# Patient Record
Sex: Male | Born: 1985 | Race: Black or African American | Hispanic: No | Marital: Single | Smoking: Former smoker
Health system: Southern US, Community
[De-identification: ages and names within clinical notes are randomized; demographics above are authoritative.]

## PROBLEM LIST (undated history)

## (undated) DIAGNOSIS — F1990 Other psychoactive substance use, unspecified, uncomplicated: Secondary | ICD-10-CM

## (undated) DIAGNOSIS — F1110 Opioid abuse, uncomplicated: Secondary | ICD-10-CM

## (undated) DIAGNOSIS — T50901A Poisoning by unspecified drugs, medicaments and biological substances, accidental (unintentional), initial encounter: Secondary | ICD-10-CM

## (undated) DIAGNOSIS — I10 Essential (primary) hypertension: Secondary | ICD-10-CM

## (undated) DIAGNOSIS — F1910 Other psychoactive substance abuse, uncomplicated: Secondary | ICD-10-CM

---

## 2014-03-26 ENCOUNTER — Emergency Department (HOSPITAL_BASED_OUTPATIENT_CLINIC_OR_DEPARTMENT_OTHER): Payer: Self-pay

## 2014-03-26 ENCOUNTER — Encounter (HOSPITAL_BASED_OUTPATIENT_CLINIC_OR_DEPARTMENT_OTHER): Payer: Self-pay | Admitting: *Deleted

## 2014-03-26 ENCOUNTER — Inpatient Hospital Stay (HOSPITAL_BASED_OUTPATIENT_CLINIC_OR_DEPARTMENT_OTHER)
Admission: EM | Admit: 2014-03-26 | Discharge: 2014-03-30 | DRG: 872 | Disposition: A | Discharge status: Home / Self Care | Payer: Self-pay | Attending: Internal Medicine | Admitting: Internal Medicine

## 2014-03-26 DIAGNOSIS — R6520 Severe sepsis without septic shock: Secondary | ICD-10-CM | POA: Diagnosis present

## 2014-03-26 DIAGNOSIS — A419 Sepsis, unspecified organism: Principal | ICD-10-CM | POA: Diagnosis present

## 2014-03-26 DIAGNOSIS — E872 Acidosis, unspecified: Secondary | ICD-10-CM | POA: Diagnosis present

## 2014-03-26 DIAGNOSIS — E876 Hypokalemia: Secondary | ICD-10-CM | POA: Diagnosis present

## 2014-03-26 DIAGNOSIS — F1010 Alcohol abuse, uncomplicated: Secondary | ICD-10-CM | POA: Diagnosis present

## 2014-03-26 DIAGNOSIS — R079 Chest pain, unspecified: Secondary | ICD-10-CM | POA: Diagnosis present

## 2014-03-26 DIAGNOSIS — R519 Headache, unspecified: Secondary | ICD-10-CM | POA: Diagnosis present

## 2014-03-26 DIAGNOSIS — R51 Headache: Secondary | ICD-10-CM

## 2014-03-26 DIAGNOSIS — E860 Dehydration: Secondary | ICD-10-CM | POA: Diagnosis present

## 2014-03-26 DIAGNOSIS — I10 Essential (primary) hypertension: Secondary | ICD-10-CM | POA: Diagnosis present

## 2014-03-26 DIAGNOSIS — M542 Cervicalgia: Secondary | ICD-10-CM | POA: Diagnosis present

## 2014-03-26 DIAGNOSIS — J019 Acute sinusitis, unspecified: Secondary | ICD-10-CM | POA: Diagnosis present

## 2014-03-26 DIAGNOSIS — J329 Chronic sinusitis, unspecified: Secondary | ICD-10-CM | POA: Diagnosis present

## 2014-03-26 DIAGNOSIS — G43909 Migraine, unspecified, not intractable, without status migrainosus: Secondary | ICD-10-CM | POA: Diagnosis present

## 2014-03-26 HISTORY — DX: Essential (primary) hypertension: I10

## 2014-03-26 LAB — BASIC METABOLIC PANEL
Anion gap: 29 — ABNORMAL HIGH (ref 5–15)
BUN: 5 mg/dL — ABNORMAL LOW (ref 6–23)
CO2: 16 mEq/L — ABNORMAL LOW (ref 19–32)
Calcium: 9 mg/dL (ref 8.4–10.5)
Chloride: 100 mEq/L (ref 96–112)
Creatinine, Ser: 1.1 mg/dL (ref 0.50–1.35)
GFR calc Af Amer: >90 mL/min (ref >90)
Glucose, Bld: 149 mg/dL — ABNORMAL HIGH (ref 70–99)
Potassium: 2.8 mEq/L — CL (ref 3.7–5.3)
SODIUM: 145 meq/L (ref 137–147)

## 2014-03-26 LAB — CBC WITH DIFFERENTIAL/PLATELET
BASOS ABS: 0 10*3/uL (ref 0.0–0.1)
BASOS PCT: 0 % (ref 0–1)
EOS PCT: 1 % (ref 0–5)
Eosinophils Absolute: 0.1 10*3/uL (ref 0.0–0.7)
HCT: 45.8 % (ref 39.0–52.0)
Hemoglobin: 15.5 g/dL (ref 13.0–17.0)
LYMPHS PCT: 14 % (ref 12–46)
Lymphs Abs: 1.7 10*3/uL (ref 0.7–4.0)
MCH: 31.7 pg (ref 26.0–34.0)
MCHC: 33.8 g/dL (ref 30.0–36.0)
MCV: 93.7 fL (ref 78.0–100.0)
Monocytes Absolute: 1.2 10*3/uL — ABNORMAL HIGH (ref 0.1–1.0)
Monocytes Relative: 10 % (ref 3–12)
Neutro Abs: 9.1 10*3/uL — ABNORMAL HIGH (ref 1.7–7.7)
Neutrophils Relative %: 75 % (ref 43–77)
PLATELETS: 321 10*3/uL (ref 150–400)
RBC: 4.89 MIL/uL (ref 4.22–5.81)
RDW: 13 % (ref 11.5–15.5)
WBC: 12.1 10*3/uL — AB (ref 4.0–10.5)

## 2014-03-26 LAB — I-STAT VENOUS BLOOD GAS, ED
Acid-base deficit: 8 mmol/L — ABNORMAL HIGH (ref 0.0–2.0)
BICARBONATE: 18.7 meq/L — AB (ref 20.0–24.0)
O2 Saturation: 70 %
PCO2 VEN: 40.2 mmHg — AB (ref 45.0–50.0)
Patient temperature: 98.6
TCO2: 20 mmol/L (ref 0–100)
pH, Ven: 7.275 (ref 7.250–7.300)
pO2, Ven: 41 mmHg (ref 30.0–45.0)

## 2014-03-26 LAB — URINALYSIS, ROUTINE W REFLEX MICROSCOPIC
Bilirubin Urine: NEGATIVE
Glucose, UA: NEGATIVE mg/dL
Hgb urine dipstick: NEGATIVE
KETONES UR: NEGATIVE mg/dL
LEUKOCYTES UA: NEGATIVE
NITRITE: NEGATIVE
PROTEIN: NEGATIVE mg/dL
Specific Gravity, Urine: 1.025 (ref 1.005–1.030)
UROBILINOGEN UA: 0.2 mg/dL (ref 0.0–1.0)
pH: 5 (ref 5.0–8.0)

## 2014-03-26 LAB — RAPID URINE DRUG SCREEN, HOSP PERFORMED
Amphetamines: NOT DETECTED
BENZODIAZEPINES: NOT DETECTED
Barbiturates: NOT DETECTED
COCAINE: NOT DETECTED
OPIATES: POSITIVE — AB
Tetrahydrocannabinol: NOT DETECTED

## 2014-03-26 LAB — I-STAT CG4 LACTIC ACID, ED
Lactic Acid, Venous: 8.48 mmol/L — ABNORMAL HIGH (ref 0.5–2.2)
Lactic Acid, Venous: 9.17 mmol/L — ABNORMAL HIGH (ref 0.5–2.2)

## 2014-03-26 LAB — TROPONIN I: Troponin I: <0.3 ng/mL (ref <0.30)

## 2014-03-26 MED ORDER — PIPERACILLIN-TAZOBACTAM 3.375 G IVPB 30 MIN
3.3750 g | Freq: Once | INTRAVENOUS | Status: AC
  Administered 2014-03-27: 3.375 g via INTRAVENOUS
  Filled 2014-03-26 (×2): qty 50

## 2014-03-26 MED ORDER — POTASSIUM CHLORIDE CRYS ER 20 MEQ PO TBCR
EXTENDED_RELEASE_TABLET | ORAL | Status: AC
  Administered 2014-03-26: 40 meq
  Filled 2014-03-26: qty 2

## 2014-03-26 MED ORDER — SODIUM CHLORIDE 0.9 % IV BOLUS (SEPSIS)
1000.0000 mL | Freq: Once | INTRAVENOUS | Status: AC
  Administered 2014-03-26: 1000 mL via INTRAVENOUS

## 2014-03-26 MED ORDER — ONDANSETRON HCL 4 MG/2ML IJ SOLN
4.0000 mg | Freq: Once | INTRAMUSCULAR | Status: AC
  Administered 2014-03-26: 4 mg via INTRAVENOUS
  Filled 2014-03-26: qty 2

## 2014-03-26 MED ORDER — POTASSIUM CHLORIDE 20 MEQ PO PACK
40.0000 meq | PACK | Freq: Once | ORAL | Status: AC
  Filled 2014-03-26: qty 2

## 2014-03-26 MED ORDER — VANCOMYCIN HCL IN DEXTROSE 1-5 GM/200ML-% IV SOLN
1000.0000 mg | Freq: Once | INTRAVENOUS | Status: AC
  Administered 2014-03-27: 1000 mg via INTRAVENOUS
  Filled 2014-03-26: qty 200

## 2014-03-26 MED ORDER — FENTANYL CITRATE 0.05 MG/ML IJ SOLN
100.0000 ug | Freq: Once | INTRAMUSCULAR | Status: AC
  Administered 2014-03-26: 100 ug via INTRAVENOUS
  Filled 2014-03-26: qty 2

## 2014-03-26 MED ORDER — POTASSIUM CHLORIDE 10 MEQ/100ML IV SOLN
10.0000 meq | Freq: Once | INTRAVENOUS | Status: AC
  Administered 2014-03-26: 10 meq via INTRAVENOUS
  Filled 2014-03-26: qty 100

## 2014-03-26 NOTE — ED Notes (Signed)
Vagal/ near syncope with IV start, placed flat supine.

## 2014-03-26 NOTE — ED Provider Notes (Addendum)
CSN: [id number]     Arrival date & time 03/26/14  2129 History   First MD Initiated Contact with Patient 03/26/14 2256     Chief Complaint  Patient presents with  . Chest Pain     (Consider location/radiation/quality/duration/timing/severity/associated sxs/prior Treatment) HPI This is a 28 year old male who has been told in the past that he has hypertension though he has not been on treatment for it. He is here with a 4 hour history of chest pain. The pain is located in his left chest and radiates to his head. He describes the pain as sharp, pressure-like and throbbing. He states he feels the pain when his heartbeats. He is having palpitations and it is noted that he has been tachycardic into the 130s. He states he is short of breath and deep breathing increases his pain. He is also having a headache, dizziness, weakness and has vomited one time. He denies diarrhea, cough or cold symptoms or fever. He states he took a [place] powder without relief. He had a near syncopal episode earlier. He has received nearly a liter of normal saline IV and continues to be tachycardic. He denies illicit drug use. He denies leg pain or swelling. He admits to a remote history or remote IV drug use but denies recent use. He states he took a Vicodin two days ago.  Past Medical History  Diagnosis Date  . Hypertension    History reviewed. No pertinent past surgical history. No family history on file. History  Substance Use Topics  . Smoking status: Never Smoker   . Smokeless tobacco: Not on file  . Alcohol Use: Yes    Review of Systems  All other systems reviewed and are negative.   Allergies  Review of patient's allergies indicates no known allergies.  Home Medications   Prior to Admission medications   Not on File   BP 112/55 mmHg  Pulse 130  Temp(Src) 98.5 F (36.9 C) (Oral)  Resp 14  Ht 5\' 11"  (1.803 m)  Wt 205 lb (92.987 kg)  BMI 28.60 kg/m2  SpO2 99%   Physical Exam  General:  Well-developed, well-nourished male in no acute distress; appearance consistent with age of record HENT: normocephalic; atraumatic; no thyromegaly Eyes: pupils equal, round and reactive to light; extraocular muscles intact Neck: supple; no meningeal signs Heart: regular rate and rhythm; tachycardic Lungs: clear to auscultation bilaterally Abdomen: soft; nondistended; nontender; no masses or hepatosplenomegaly; bowel sounds present Extremities: No deformity; full range of motion; pulses normal Neurologic: Awake, alert and oriented; motor function intact in all extremities and symmetric; no facial droop Skin: Warm and dry; multiple keloids of forearms Psychiatric: Flat affect   ED Course  Procedures (including critical care time)   EKG Interpretation   Date/Time:  Monday March 26 2014 21:37:16 EST Ventricular Rate:  132 PR Interval:  126 QRS Duration: 106 QT Interval:  340 QTC Calculation: 503 R Axis:   47 Text Interpretation:  Sinus tachycardia Possible Inferior infarct , age  undetermined ST \\T \ T wave abnormality, consider lateral ischemia Abnormal  ECG No old tracing to compare Confirmed by JACUBOWITZ  MD, SAM [phone]) on  03/26/2014 9:48:45 PM      CRITICAL CARE Performed by: Paula Libra L Total critical care time: 45 minutes Critical care time was exclusive of separately billable procedures and treating other patients. Critical care was necessary to treat or prevent imminent or life-threatening deterioration. Critical care was time spent personally by me on the following activities: development of treatment  plan with patient and/or surrogate as well as nursing, discussions with consultants, evaluation of patient's response to treatment, examination of patient, obtaining history from patient or surrogate, ordering and performing treatments and interventions, ordering and review of laboratory studies, ordering and review of radiographic studies, pulse oximetry and  re-evaluation of patient's condition.   MDM   Nursing notes and vitals signs, including pulse oximetry, reviewed.  Summary of this visit's results, reviewed by myself:  Labs:  Results for orders placed or performed during the hospital encounter of 03/26/14 (from the past 24 hour(s))  Hepatic function panel     Status: Abnormal   Collection Time: 03/26/14  9:41 PM  Result Value Ref Range   Total Protein 8.4 (H) 6.0 - 8.3 g/dL   Albumin 4.2 3.5 - 5.2 g/dL   AST 31 0 - 37 U/L   ALT 18 0 - 53 U/L   Alkaline Phosphatase 68 39 - 117 U/L   Total Bilirubin <0.2 (L) 0.3 - 1.2 mg/dL   Bilirubin, Direct <7.8<0.2 0.0 - 0.3 mg/dL   Indirect Bilirubin NOT CALCULATED 0.3 - 0.9 mg/dL  Troponin I ([facility])     Status: None   Collection Time: 03/26/14  9:46 PM  Result Value Ref Range   Troponin I <0.30 <0.30 ng/mL  Basic metabolic panel     Status: Abnormal   Collection Time: 03/26/14  9:46 PM  Result Value Ref Range   Sodium 145 137 - 147 mEq/L   Potassium 2.8 (LL) 3.7 - 5.3 mEq/L   Chloride 100 96 - 112 mEq/L   CO2 16 (L) 19 - 32 mEq/L   Glucose, Bld 149 (H) 70 - 99 mg/dL   BUN 5 (L) 6 - 23 mg/dL   Creatinine, Ser 2.951.10 0.50 - 1.35 mg/dL   Calcium 9.0 8.4 - 62.110.5 mg/dL   GFR calc non Af Amer >90 >90 mL/min   GFR calc Af Amer >90 >90 mL/min   Anion gap 29 (H) 5 - 15  CBC with Differential     Status: Abnormal   Collection Time: 03/26/14  9:46 PM  Result Value Ref Range   WBC 12.1 (H) 4.0 - 10.5 K/uL   RBC 4.89 4.22 - 5.81 MIL/uL   Hemoglobin 15.5 13.0 - 17.0 g/dL   HCT 30.845.8 65.739.0 - 84.652.0 %   MCV 93.7 78.0 - 100.0 fL   MCH 31.7 26.0 - 34.0 pg   MCHC 33.8 30.0 - 36.0 g/dL   RDW 96.213.0 95.211.5 - 84.115.5 %   Platelets 321 150 - 400 K/uL   Neutrophils Relative % 75 43 - 77 %   Neutro Abs 9.1 (H) 1.7 - 7.7 K/uL   Lymphocytes Relative 14 12 - 46 %   Lymphs Abs 1.7 0.7 - 4.0 K/uL   Monocytes Relative 10 3 - 12 %   Monocytes Absolute 1.2 (H) 0.1 - 1.0 K/uL   Eosinophils Relative 1 0 - 5 %   Eosinophils  Absolute 0.1 0.0 - 0.7 K/uL   Basophils Relative 0 0 - 1 %   Basophils Absolute 0.0 0.0 - 0.1 K/uL  Urinalysis, Routine w reflex microscopic     Status: None   Collection Time: 03/26/14 10:55 PM  Result Value Ref Range   Color, Urine YELLOW YELLOW   APPearance CLEAR CLEAR   Specific Gravity, Urine 1.025 1.005 - 1.030   pH 5.0 5.0 - 8.0   Glucose, UA NEGATIVE NEGATIVE mg/dL   Hgb urine dipstick NEGATIVE NEGATIVE   Bilirubin  Urine NEGATIVE NEGATIVE   Ketones, ur NEGATIVE NEGATIVE mg/dL   Protein, ur NEGATIVE NEGATIVE mg/dL   Urobilinogen, UA 0.2 0.0 - 1.0 mg/dL   Nitrite NEGATIVE NEGATIVE   Leukocytes, UA NEGATIVE NEGATIVE  Drug screen panel, emergency     Status: Abnormal   Collection Time: 03/26/14 11:02 PM  Result Value Ref Range   Opiates POSITIVE (A) NONE DETECTED   Cocaine NONE DETECTED NONE DETECTED   Benzodiazepines NONE DETECTED NONE DETECTED   Amphetamines NONE DETECTED NONE DETECTED   Tetrahydrocannabinol NONE DETECTED NONE DETECTED   Barbiturates NONE DETECTED NONE DETECTED  D-dimer, quantitative     Status: None   Collection Time: 03/26/14 11:05 PM  Result Value Ref Range   D-Dimer, Quant <0.27 0.00 - 0.48 ug/mL-FEU  I-Stat CG4 Lactic Acid, ED     Status: Abnormal   Collection Time: 03/26/14 11:13 PM  Result Value Ref Range   Lactic Acid, Venous 9.17 (H) 0.5 - 2.2 mmol/L  I-Stat Venous Blood Gas, ED ([facility] and [facility] only)     Status: Abnormal   Collection Time: 03/26/14 11:14 PM  Result Value Ref Range   pH, Ven 7.275 7.250 - 7.300   pCO2, Ven 40.2 (L) 45.0 - 50.0 mmHg   pO2, Ven 41.0 30.0 - 45.0 mmHg   Bicarbonate 18.7 (L) 20.0 - 24.0 mEq/L   TCO2 20 0 - 100 mmol/L   O2 Saturation 70.0 %   Acid-base deficit 8.0 (H) 0.0 - 2.0 mmol/L   Patient temperature 98.6 F    Collection site IV START    Drawn by Nurse    Sample type VENOUS   I-Stat CG4 Lactic Acid, ED     Status: Abnormal   Collection Time: 03/26/14 11:27 PM  Result Value Ref Range   Lactic  Acid, Venous 8.48 (H) 0.5 - 2.2 mmol/L  I-Stat CG4 Lactic Acid, ED     Status: Abnormal   Collection Time: 03/27/14 12:15 AM  Result Value Ref Range   Lactic Acid, Venous 6.68 (H) 0.5 - 2.2 mmol/L    Imaging Studies: Dg Chest 2 View  03/27/2014   CLINICAL DATA:  Chest pain  EXAM: CHEST  2 VIEW  COMPARISON:  None.  FINDINGS: Normal heart size and mediastinal contours. No acute infiltrate or edema. No effusion or pneumothorax. No acute osseous findings.  IMPRESSION: No active cardiopulmonary disease.   Electronically Signed   By: Tiburcio PeaJonathan  Watts M.D.   On: 03/27/2014 01:35    11:45 PM Second liter of normal saline infusing. Blood cultures obtained. Zosyn IV ordered for possible sepsis.   12:09 AM Level 2 Code Sepsis called. 2L NS have been given. Repeat lactic acid pending.  12:23 AM Level 1 Code Sepsis for continued lactic acid > 4 after IVF bolus.  12:30 AM Discussed with Leitha BleakLiz Deterding, MD of PCCM. She accepts for transfer to ICU.         Hanley SeamenJohn L Deeanna Beightol, MD 03/27/14 0150  Hanley SeamenJohn L Lizania Bouchard, MD 03/27/14 [phone]

## 2014-03-26 NOTE — ED Notes (Signed)
EDP notified of critical K

## 2014-03-26 NOTE — ED Notes (Addendum)
H/o htn. Not prescribed medicine. C/o HA, dizziness, CP, sob, weakness, nv, pre-syncopal. Denies: LOC, cough congestion cold sx, diarrhea or fever). Onset 3 hrs ago. Relates sx to BP. Took goodies powder 3 hrs ago w/o relief. HR tachy, "feel dehydrated".

## 2014-03-27 ENCOUNTER — Inpatient Hospital Stay (HOSPITAL_COMMUNITY): Payer: Self-pay

## 2014-03-27 DIAGNOSIS — M542 Cervicalgia: Secondary | ICD-10-CM | POA: Diagnosis present

## 2014-03-27 DIAGNOSIS — A419 Sepsis, unspecified organism: Principal | ICD-10-CM

## 2014-03-27 DIAGNOSIS — R51 Headache: Secondary | ICD-10-CM

## 2014-03-27 DIAGNOSIS — G441 Vascular headache, not elsewhere classified: Secondary | ICD-10-CM

## 2014-03-27 DIAGNOSIS — R079 Chest pain, unspecified: Secondary | ICD-10-CM | POA: Diagnosis present

## 2014-03-27 DIAGNOSIS — R519 Headache, unspecified: Secondary | ICD-10-CM | POA: Diagnosis present

## 2014-03-27 DIAGNOSIS — R0789 Other chest pain: Secondary | ICD-10-CM

## 2014-03-27 LAB — BASIC METABOLIC PANEL
ANION GAP: 16 — AB (ref 5–15)
ANION GAP: 10 (ref 5–15)
Anion gap: 13 (ref 5–15)
BUN: 4 mg/dL — ABNORMAL LOW (ref 6–23)
BUN: 4 mg/dL — ABNORMAL LOW (ref 6–23)
BUN: 3 mg/dL — ABNORMAL LOW (ref 6–23)
CALCIUM: 7.3 mg/dL — AB (ref 8.4–10.5)
CHLORIDE: 111 meq/L (ref 96–112)
CHLORIDE: 113 meq/L — AB (ref 96–112)
CHLORIDE: 107 meq/L (ref 96–112)
CO2: 18 mEq/L — ABNORMAL LOW (ref 19–32)
CO2: 17 meq/L — AB (ref 19–32)
CO2: 21 mEq/L (ref 19–32)
CREATININE: 0.92 mg/dL (ref 0.50–1.35)
Calcium: 7.2 mg/dL — ABNORMAL LOW (ref 8.4–10.5)
Calcium: 8.1 mg/dL — ABNORMAL LOW (ref 8.4–10.5)
Creatinine, Ser: 0.86 mg/dL (ref 0.50–1.35)
Creatinine, Ser: 0.84 mg/dL (ref 0.50–1.35)
GFR calc Af Amer: >90 mL/min (ref >90)
GFR calc Af Amer: >90 mL/min (ref >90)
GFR calc Af Amer: >90 mL/min (ref >90)
GFR calc non Af Amer: >90 mL/min (ref >90)
GFR calc non Af Amer: >90 mL/min (ref >90)
GFR calc non Af Amer: >90 mL/min (ref >90)
Glucose, Bld: 126 mg/dL — ABNORMAL HIGH (ref 70–99)
Glucose, Bld: 114 mg/dL — ABNORMAL HIGH (ref 70–99)
Glucose, Bld: 132 mg/dL — ABNORMAL HIGH (ref 70–99)
POTASSIUM: 4 meq/L (ref 3.7–5.3)
Potassium: 3.7 mEq/L (ref 3.7–5.3)
Potassium: 3.5 mEq/L — ABNORMAL LOW (ref 3.7–5.3)
SODIUM: 143 meq/L (ref 137–147)
SODIUM: 138 meq/L (ref 137–147)
Sodium: 145 mEq/L (ref 137–147)

## 2014-03-27 LAB — I-STAT CG4 LACTIC ACID, ED: LACTIC ACID, VENOUS: 6.68 mmol/L — AB (ref 0.5–2.2)

## 2014-03-27 LAB — HEPATIC FUNCTION PANEL
ALT: 18 U/L (ref 0–53)
AST: 31 U/L (ref 0–37)
Albumin: 4.2 g/dL (ref 3.5–5.2)
Alkaline Phosphatase: 68 U/L (ref 39–117)
Bilirubin, Direct: <0.2 mg/dL (ref 0.0–0.3)
Total Protein: 8.4 g/dL — ABNORMAL HIGH (ref 6.0–8.3)

## 2014-03-27 LAB — PHOSPHORUS
PHOSPHORUS: 2.3 mg/dL (ref 2.3–4.6)
Phosphorus: 2.9 mg/dL (ref 2.3–4.6)
Phosphorus: 1.5 mg/dL — ABNORMAL LOW (ref 2.3–4.6)

## 2014-03-27 LAB — MAGNESIUM
MAGNESIUM: 2.4 mg/dL (ref 1.5–2.5)
Magnesium: 1.1 mg/dL — ABNORMAL LOW (ref 1.5–2.5)
Magnesium: 1.7 mg/dL (ref 1.5–2.5)

## 2014-03-27 LAB — PROCALCITONIN: Procalcitonin: <0.1 ng/mL

## 2014-03-27 LAB — TSH: TSH: 2.39 u[IU]/mL (ref 0.350–4.500)

## 2014-03-27 LAB — SALICYLATE LEVEL

## 2014-03-27 LAB — MRSA PCR SCREENING: MRSA by PCR: NEGATIVE

## 2014-03-27 LAB — D-DIMER, QUANTITATIVE (NOT AT ARMC)

## 2014-03-27 LAB — LIPASE, BLOOD: Lipase: 28 U/L (ref 11–59)

## 2014-03-27 LAB — HIV ANTIBODY (ROUTINE TESTING W REFLEX): HIV 1&2 Ab, 4th Generation: NONREACTIVE

## 2014-03-27 LAB — ACETAMINOPHEN LEVEL: Acetaminophen (Tylenol), Serum: <15 ug/mL (ref 10–30)

## 2014-03-27 LAB — GLUCOSE, CAPILLARY: Glucose-Capillary: 119 mg/dL — ABNORMAL HIGH (ref 70–99)

## 2014-03-27 LAB — OSMOLALITY, URINE: Osmolality, Ur: 188 mOsm/kg — ABNORMAL LOW (ref 390–1090)

## 2014-03-27 LAB — OSMOLALITY: OSMOLALITY: 294 mosm/kg (ref 275–300)

## 2014-03-27 LAB — LACTIC ACID, PLASMA: LACTIC ACID, VENOUS: 3.6 mmol/L — AB (ref 0.5–2.2)

## 2014-03-27 MED ORDER — SODIUM CHLORIDE 0.45 % IV SOLN
INTRAVENOUS | Status: DC
  Administered 2014-03-27 – 2014-03-29 (×3): via INTRAVENOUS

## 2014-03-27 MED ORDER — HEPARIN SODIUM (PORCINE) 5000 UNIT/ML IJ SOLN
5000.0000 [IU] | Freq: Three times a day (TID) | INTRAMUSCULAR | Status: DC
  Administered 2014-03-27 – 2014-03-28 (×5): 5000 [IU] via SUBCUTANEOUS
  Filled 2014-03-27 (×12): qty 1

## 2014-03-27 MED ORDER — SODIUM CHLORIDE 0.9 % IV SOLN: 250.0000 mL | INTRAVENOUS | Status: DC | PRN

## 2014-03-27 MED ORDER — FENTANYL CITRATE 0.05 MG/ML IJ SOLN
50.0000 ug | INTRAMUSCULAR | Status: DC | PRN
Start: 2014-03-27 — End: 2014-03-27
  Administered 2014-03-27 (×2): 50 ug via INTRAVENOUS
  Filled 2014-03-27 (×2): qty 2

## 2014-03-27 MED ORDER — SODIUM CHLORIDE 0.9 % IV BOLUS (SEPSIS)
1000.0000 mL | Freq: Once | INTRAVENOUS | Status: AC
  Administered 2014-03-27: 1000 mL via INTRAVENOUS

## 2014-03-27 MED ORDER — SODIUM CHLORIDE 0.9 % IV SOLN
INTRAVENOUS | Status: DC
  Administered 2014-03-27: 03:00:00 via INTRAVENOUS

## 2014-03-27 MED ORDER — HYDROMORPHONE HCL 1 MG/ML IJ SOLN
INTRAMUSCULAR | Status: AC
  Administered 2014-03-27: 0.5 mg
  Filled 2014-03-27: qty 1

## 2014-03-27 MED ORDER — LORAZEPAM 2 MG/ML IJ SOLN
1.0000 mg | Freq: Once | INTRAMUSCULAR | Status: AC
  Administered 2014-03-27: 1 mg via INTRAVENOUS

## 2014-03-27 MED ORDER — PIPERACILLIN-TAZOBACTAM 3.375 G IVPB
3.3750 g | Freq: Three times a day (TID) | INTRAVENOUS | Status: DC
  Administered 2014-03-27: 3.375 g via INTRAVENOUS
  Filled 2014-03-27 (×3): qty 50

## 2014-03-27 MED ORDER — MAGNESIUM SULFATE 50 % IJ SOLN
8.0000 g | Freq: Once | INTRAVENOUS | Status: AC
  Administered 2014-03-27: 8 g via INTRAVENOUS
  Filled 2014-03-27: qty 16

## 2014-03-27 MED ORDER — HYDROMORPHONE HCL 1 MG/ML IJ SOLN
0.5000 mg | INTRAMUSCULAR | Status: DC | PRN
  Administered 2014-03-27 (×2): 1 mg via INTRAVENOUS
  Administered 2014-03-27: 0.5 mg via INTRAVENOUS
  Administered 2014-03-27 – 2014-03-28 (×6): 1 mg via INTRAVENOUS
  Filled 2014-03-27 (×9): qty 1

## 2014-03-27 MED ORDER — FENTANYL CITRATE 0.05 MG/ML IJ SOLN
100.0000 ug | Freq: Once | INTRAMUSCULAR | Status: AC
  Administered 2014-03-27: 100 ug via INTRAVENOUS
  Filled 2014-03-27: qty 2

## 2014-03-27 MED ORDER — VANCOMYCIN HCL IN DEXTROSE 1-5 GM/200ML-% IV SOLN
1000.0000 mg | Freq: Three times a day (TID) | INTRAVENOUS | Status: DC
  Administered 2014-03-27 – 2014-03-28 (×4): 1000 mg via INTRAVENOUS
  Filled 2014-03-27 (×6): qty 200

## 2014-03-27 MED ORDER — PANTOPRAZOLE SODIUM 40 MG PO TBEC
40.0000 mg | DELAYED_RELEASE_TABLET | Freq: Every day | ORAL | Status: DC
  Administered 2014-03-27 – 2014-03-28 (×2): 40 mg via ORAL
  Filled 2014-03-27 (×3): qty 1

## 2014-03-27 MED ORDER — LORAZEPAM 2 MG/ML IJ SOLN
INTRAMUSCULAR | Status: AC
  Filled 2014-03-27: qty 1

## 2014-03-27 MED ORDER — POTASSIUM CHLORIDE 10 MEQ/100ML IV SOLN
10.0000 meq | Freq: Once | INTRAVENOUS | Status: AC
  Administered 2014-03-27: 10 meq via INTRAVENOUS
  Filled 2014-03-27: qty 100

## 2014-03-27 MED ORDER — LORAZEPAM 2 MG/ML IJ SOLN
1.0000 mg | Freq: Once | INTRAMUSCULAR | Status: AC
  Administered 2014-03-27: 1 mg via INTRAVENOUS
  Filled 2014-03-27: qty 1

## 2014-03-27 MED ORDER — POTASSIUM CHLORIDE 10 MEQ/100ML IV SOLN
10.0000 meq | INTRAVENOUS | Status: AC
  Administered 2014-03-27 (×3): 10 meq via INTRAVENOUS
  Filled 2014-03-27 (×3): qty 100

## 2014-03-27 MED ORDER — CEFTRIAXONE SODIUM IN DEXTROSE 40 MG/ML IV SOLN
2.0000 g | Freq: Two times a day (BID) | INTRAVENOUS | Status: DC
  Administered 2014-03-27 – 2014-03-29 (×6): 2 g via INTRAVENOUS
  Filled 2014-03-27 (×8): qty 50

## 2014-03-27 NOTE — H&P (Signed)
PULMONARY / CRITICAL CARE MEDICINE   Name: Grant Tapia MRN: [id number] DOB: Sep 28, 1985    ADMISSION DATE:  03/26/2014 CONSULTATION DATE:  03/26/2014  REFERRING MD :  [facility] EDP  CHIEF COMPLAINT:  Chest pain  INITIAL PRESENTATION: 28 year old male presented to [facility] ED 12/14 complaining of CP x 4 hours radiating to L side and head. In ED noted to be tachycardic, with elevated lactic acid (9). This improved somewhat with IVF resuscitation. He was transferred to [facility] for ICU admission. PCCM to assume care.   STUDIES:   SIGNIFICANT EVENTS: 12/15- some clearance lactic acid  HISTORY OF PRESENT ILLNESS:  28 year old male with history of hypertension, however, he is not on any medications for this. He presented to [facility] 12/14 c/o chest pain x 4 hours with headache since 12/12. Pain was described as radiating to head, neck, and exacerbated by deep breathing. He also complained of palpitations. Also SOB and a near syncopal episode earlier. Of note he took one Vicodin 12/12 for headache and nothing else. In ED he was noted to be tachycardic (130) with lactic acid elevation (9). He was provided with IVF resuscitation, which has somewhat improved these. He was transferred to [facility] for ICU admission. PCCM to assume care.    PAST MEDICAL HISTORY :   has a past medical history of Hypertension.  has no past surgical history on file. Prior to Admission medications   Not on File   No Known Allergies  FAMILY HISTORY:  has no family status information on file.  SOCIAL HISTORY:  reports that he has never smoked. He does not have any smokeless tobacco history on file. He reports that he drinks alcohol. He reports that he does not use illicit drugs.  REVIEW OF SYSTEMS:   Bolds are positive  Constitutional: weight loss, gain, night sweats, Fevers, chills, fatigue .  HEENT: headaches, Sore throat, sneezing, nasal congestion, post nasal drip, Difficulty swallowing, Tooth/dental problems, visual  complaints visual changes, ear ache CV:  chest pain, radiates: to neck and head ,Orthopnea, PND, swelling in lower extremities, dizziness, palpitations, syncope.  GI  heartburn, indigestion, abdominal pain, nausea, vomiting, diarrhea, change in bowel habits, loss of appetite, bloody stools.  Resp: cough, productive: , hemoptysis, dyspnea, chest pain, pleuritic.  Skin: rash or itching or icterus GU: dysuria, change in color of urine, urgency or frequency. flank pain, hematuria, frequent nocturia  MS: joint pain or swelling. decreased range of motion  Psych: change in mood or affect. depression or anxiety.  Neuro: difficulty with speech, weakness, numbness, ataxia    SUBJECTIVE:   VITAL SIGNS: Temp:  [98.5 F (36.9 C)-98.6 F (37 C)] 98.5 F (36.9 C) (12/14 2316) Pulse Rate:  [127-134] 130 (12/15 0045) Resp:  [12-18] 14 (12/15 0045) BP: (106-119)/(42-70) 112/55 mmHg (12/15 0045) SpO2:  [97 %-100 %] 99 % (12/15 0045) Weight:  [92.987 kg (205 lb)] 92.987 kg (205 lb) (12/14 2140) HEMODYNAMICS:   VENTILATOR SETTINGS:   INTAKE / OUTPUT: No intake or output data in the 24 hours ending 03/27/14 0203  PHYSICAL EXAMINATION: General:  Young male of normal body habitus in NAD Neuro:  Alert, oriented, non-focal. No nuchal rigidity HEENT:  [place]/AT, PERRL, no JVD noted  Cardiovascular:  Tachy, regular, no MRG noted Lungs:  Clear bilateral breath sounds Abdomen:  Soft, non-tender, non-distended Musculoskeletal:  No acute deformity or ROM limitation.  Skin:  Intact  LABS:  CBC  Recent Labs Lab 03/26/14 2146  WBC 12.1*  HGB 15.5  HCT 45.8  PLT 321   Coag's No results for input(s): APTT, INR in the last 168 hours. BMET  Recent Labs Lab 03/26/14 2146  NA 145  K 2.8*  CL 100  CO2 16*  BUN 5*  CREATININE 1.10  GLUCOSE 149*   Electrolytes  Recent Labs Lab 03/26/14 2146  CALCIUM 9.0   Sepsis Markers  Recent Labs Lab 03/26/14 2313 03/26/14 2327 03/27/14 0015   LATICACIDVEN 9.17* 8.48* 6.68*   ABG No results for input(s): PHART, PCO2ART, PO2ART in the last 168 hours. Liver Enzymes  Recent Labs Lab 03/26/14 2141  AST 31  ALT 18  ALKPHOS 68  BILITOT <0.2*  ALBUMIN 4.2   Cardiac Enzymes  Recent Labs Lab 03/26/14 2146  TROPONINI <0.30   Glucose No results for input(s): GLUCAP in the last 168 hours.  Imaging No results found.   ASSESSMENT / PLAN:  PULMONARY A: Uncompensated met acidosis  P:   Ensure lactic clearance  CARDIOVASCULAR A:  Sinus tachycardia QTC prolongation- Mag 1.1, hypoK  P:  Tele IVF resuscitation Repeat EKG in AM Repeat lactic acid re assuring Aggressive mag supp IV STAT k supp Goal K greater 4 goal, mg greater 2 ecg post supp Echo - chest pain  RENAL A:   High AG metabolic acidosis- improved Hypokalemia, HYpomag  P:   Repeat Bmet in pm  Mag, k supp Hydrate to 1/2 NS to avoid NONAG  GASTROINTESTINAL A:   hungry P:   SUP PO protonix Heart healthy diet  HEMATOLOGIC A:   DVT prevention P:  Monitor CBC Ambulate or add sub q heparin  INFECTIOUS A:   SIRS ? Sepsis. Etiology unclear at this point.  Neurostatus does not support Encephalitis R/o epidural abscess, high risk with IVDA P:   BCx2 12/24 > UC 12/24 > Abx: Pip/tazo, start date 12/15>>>12/15 Ceftriaxone 12/15>>> Abx: Vancomycin, start date 12/15 Check PCT - neg  Send HIV Lactic clearance improved Consider MRI neck / head, then will decide if needed LP Change zosyn to BB ceftriaxone for now  ENDOCRINE A:   No acute issues  P:   Monitor glucose on chemistry  NEUROLOGIC A:   ETOH abuse, concern for withdrawal (last drink 12/13) H/o Drug abuse, r/o epidural abscess Pain management  P:   RASS goal: 0 Ativan 5m now, if positive response will initiate ICU CIWA protocol. Monitor PRN fentanyl for pain Check osmolar gap For MRI, see ID  FAMILY  - Updates:   - Inter-disciplinary family meet or  Palliative Care meeting due by:  12/22   PGeorgann Housekeeper ACNP [place]/Critical Care Pager [phone] ([phone] STAFF NOTE: ILinwood Dibbles MD FACP have personally reviewed patient's available data, including medical history, events of note, physical examination and test results as part of my evaluation. I have discussed with resident/NP and other care providers such as pharmacist, RN and RRT. In addition, I personally evaluated patient and elicited key findings of: lactic clearing, normal mental status, at risk epidural abscess with ivda, MRi neck (although would be more typical thoracic and not cervical), change fludis 1/2 NS, mag supp aggressive, k supp, labs in am , send HIV, change to ceftriaxone The patient is critically ill with multiple organ systems failure and requires high complexity decision making for assessment and support, frequent evaluation and titration of therapies, application of advanced monitoring technologies and extensive interpretation of multiple databases.   Critical Care Time devoted to patient care services described in  this note is35 Minutes. This time reflects time of care of this signee: Merrie Roof, MD FACP. This critical care time does not reflect procedure time, or teaching time or supervisory time of PA/NP/Med student/Med Resident etc but could involve care discussion time. Rest per NP/medical resident whose note is outlined above and that I agree with   Lavon Paganini. Titus Mould, MD, [place] Pgr: [facility] 03/27/2014 8:24 AM

## 2014-03-27 NOTE — Progress Notes (Signed)
EKG CRITICAL VALUE     12 lead EKG performed.  Critical value noted. Hazel Samshristian Smith, RN notified.   Kery Batzel L, CCT 03/27/2014 7:45 AM

## 2014-03-27 NOTE — Progress Notes (Signed)
Pt HR mid 120s, shaky, and complaining of headache. Joneen RoachPaul Hoffman NP ordered to give 1mg  of Ativan and call back in 30 mins to see if symptoms would resolve. Pt still having symptoms. Joneen RoachPaul Hoffman NP was notified and order to give 50 mcg of fentanyl for headache. Pt still complaining of headache and tachycardia still present, BP starting to become soft 107/45 (60), 94/49 (57) . Called and notified MD Deterding, ordered to give 1 liter bolus of 0.9% NS and also 1mg  of Ativan. Will continue to monitor and assess.

## 2014-03-27 NOTE — Progress Notes (Signed)
ANTIBIOTIC CONSULT NOTE - INITIAL  Pharmacy Consult for Vancomycin and Zosyn  Indication: rule out sepsis  No Known Allergies  Patient Measurements: Height: 5\' 11"  (180.3 cm) Weight: 199 lb 11.8 oz (90.6 kg) IBW/kg (Calculated) : 75.3  Vital Signs: Temp: 99 F (37.2 C) (12/15 0226) Temp Source: Oral (12/15 0226) BP: 130/63 mmHg (12/15 0200) Pulse Rate: 127 (12/15 0200) Intake/Output from previous day:   Intake/Output from this shift:    Labs:  Recent Labs  03/26/14 2146  WBC 12.1*  HGB 15.5  PLT 321  CREATININE 1.10   Estimated Creatinine Clearance: 117.2 mL/min (by C-G formula based on Cr of 1.1). No results for input(s): VANCOTROUGH, VANCOPEAK, VANCORANDOM, GENTTROUGH, GENTPEAK, GENTRANDOM, TOBRATROUGH, TOBRAPEAK, TOBRARND, AMIKACINPEAK, AMIKACINTROU, AMIKACIN in the last 72 hours.   Microbiology: No results found for this or any previous visit (from the past 720 hour(s)).  Medical History: Past Medical History  Diagnosis Date  . Hypertension     Medications:  No prescriptions prior to admission   Assessment: 28 yo male with tachycardia, possible sepsis, for empiric antibiotics.  Vancomycin 1 g IV given in ED at 0015  Goal of Therapy:  Vancomycin trough level 15-20 mcg/ml  Plan:  Vancomycin 1 g IV q8h Zosyn 3.375 g IV q8h   Grant Tapia, Grant Tapia Grant Tapia 03/27/2014,3:06 AM

## 2014-03-28 ENCOUNTER — Inpatient Hospital Stay (HOSPITAL_COMMUNITY): Payer: Self-pay

## 2014-03-28 LAB — MAGNESIUM: Magnesium: 2.2 mg/dL (ref 1.5–2.5)

## 2014-03-28 LAB — CBC WITH DIFFERENTIAL/PLATELET
Basophils Absolute: 0 10*3/uL (ref 0.0–0.1)
Basophils Relative: 0 % (ref 0–1)
EOS PCT: 1 % (ref 0–5)
Eosinophils Absolute: 0.1 10*3/uL (ref 0.0–0.7)
HEMATOCRIT: 39.7 % (ref 39.0–52.0)
Hemoglobin: 13.5 g/dL (ref 13.0–17.0)
LYMPHS PCT: 17 % (ref 12–46)
Lymphs Abs: 1.6 10*3/uL (ref 0.7–4.0)
MCH: 31.5 pg (ref 26.0–34.0)
MCHC: 34 g/dL (ref 30.0–36.0)
MCV: 92.8 fL (ref 78.0–100.0)
MONO ABS: 0.8 10*3/uL (ref 0.1–1.0)
Monocytes Relative: 8 % (ref 3–12)
Neutro Abs: 6.7 10*3/uL (ref 1.7–7.7)
Neutrophils Relative %: 74 % (ref 43–77)
Platelets: 266 10*3/uL (ref 150–400)
RBC: 4.28 MIL/uL (ref 4.22–5.81)
RDW: 13.5 % (ref 11.5–15.5)
WBC: 9.1 10*3/uL (ref 4.0–10.5)

## 2014-03-28 LAB — BASIC METABOLIC PANEL
ANION GAP: 12 (ref 5–15)
Anion gap: 12 (ref 5–15)
BUN: 3 mg/dL — ABNORMAL LOW (ref 6–23)
CHLORIDE: 104 meq/L (ref 96–112)
CHLORIDE: 102 meq/L (ref 96–112)
CO2: 22 mEq/L (ref 19–32)
CO2: 22 meq/L (ref 19–32)
Calcium: 8.4 mg/dL (ref 8.4–10.5)
Calcium: 8.7 mg/dL (ref 8.4–10.5)
Creatinine, Ser: 0.81 mg/dL (ref 0.50–1.35)
Creatinine, Ser: 0.76 mg/dL (ref 0.50–1.35)
GFR calc non Af Amer: >90 mL/min (ref >90)
GFR calc non Af Amer: >90 mL/min (ref >90)
GLUCOSE: 115 mg/dL — AB (ref 70–99)
Glucose, Bld: 84 mg/dL (ref 70–99)
POTASSIUM: 5.7 meq/L — AB (ref 3.7–5.3)
Potassium: 4 mEq/L (ref 3.7–5.3)
SODIUM: 136 meq/L — AB (ref 137–147)
Sodium: 138 mEq/L (ref 137–147)

## 2014-03-28 LAB — PHOSPHORUS: Phosphorus: 3 mg/dL (ref 2.3–4.6)

## 2014-03-28 LAB — LACTIC ACID, PLASMA: Lactic Acid, Venous: 0.8 mmol/L (ref 0.5–2.2)

## 2014-03-28 LAB — VANCOMYCIN, TROUGH: Vancomycin Tr: 8.1 ug/mL — ABNORMAL LOW (ref 10.0–20.0)

## 2014-03-28 MED ORDER — ONDANSETRON HCL 4 MG/2ML IJ SOLN
4.0000 mg | Freq: Four times a day (QID) | INTRAMUSCULAR | Status: DC | PRN
  Administered 2014-03-28: 4 mg via INTRAVENOUS
  Filled 2014-03-28: qty 2

## 2014-03-28 MED ORDER — OXYCODONE-ACETAMINOPHEN 5-325 MG PO TABS
1.0000 | ORAL_TABLET | Freq: Four times a day (QID) | ORAL | Status: DC | PRN
  Administered 2014-03-28 – 2014-03-30 (×6): 2 via ORAL
  Filled 2014-03-28 (×6): qty 2

## 2014-03-28 MED ORDER — ACETAMINOPHEN-CODEINE #3 300-30 MG PO TABS
1.0000 | ORAL_TABLET | ORAL | Status: DC | PRN
  Administered 2014-03-28: 1 via ORAL
  Filled 2014-03-28: qty 1

## 2014-03-28 MED ORDER — POTASSIUM & SODIUM PHOSPHATES 280-160-250 MG PO PACK
1.0000 | PACK | Freq: Three times a day (TID) | ORAL | Status: AC
  Administered 2014-03-28 (×3): 1 via ORAL
  Filled 2014-03-28 (×4): qty 1

## 2014-03-28 MED ORDER — VANCOMYCIN HCL 10 G IV SOLR
1500.0000 mg | Freq: Three times a day (TID) | INTRAVENOUS | Status: DC
  Administered 2014-03-28 – 2014-03-30 (×6): 1500 mg via INTRAVENOUS
  Filled 2014-03-28 (×7): qty 1500

## 2014-03-28 MED ORDER — TRAMADOL HCL 50 MG PO TABS
50.0000 mg | ORAL_TABLET | Freq: Four times a day (QID) | ORAL | Status: DC | PRN
  Administered 2014-03-28: 50 mg via ORAL
  Filled 2014-03-28 (×2): qty 1

## 2014-03-28 NOTE — Progress Notes (Signed)
eLink Physician-Brief Progress Note Patient Name: Grant Tapia DOB: 19-Dec-1985 MRN: [id number]   Date of Service  03/28/2014  HPI/Events of Note  Headache nausea  eICU Interventions  Prn percocet Prn zofran     Intervention Category Intermediate Interventions: Pain - evaluation and management Minor Interventions: Routine modifications to care plan (e.g. PRN medications for pain, fever)  MCQUAID, DOUGLAS 03/28/2014, 9:00 PM

## 2014-03-28 NOTE — Progress Notes (Signed)
ANTIBIOTIC CONSULT NOTE - FOLLOW UP  Pharmacy Consult for vancomycin Indication: rule out sepsis  No Known Allergies  Patient Measurements: Height: 5\' 11"  (180.3 cm) Weight: 208 lb 5.4 oz (94.5 kg) (bed) IBW/kg (Calculated) : 75.3  Vital Signs: Temp: 98.2 F (36.8 C) (12/16 0435) Temp Source: Oral (12/16 0435) BP: 134/76 mmHg (12/16 0435) Pulse Rate: 114 (12/16 0435) Intake/Output from previous day: 12/15 0701 - 12/16 0700 In: 4368.5 [P.O.:840; I.V.:2612.5; IV Piggyback:916] Out: 5575 [Urine:5575] Intake/Output from this shift: Total I/O In: 240 [P.O.:240] Out: 400 [Urine:400]  Labs:  Recent Labs  03/26/14 2146  03/27/14 0930 03/27/14 2010 03/28/14 0832  WBC 12.1*  --   --   --  9.1  HGB 15.5  --   --   --  13.5  PLT 321  --   --   --  266  CREATININE 1.10  < > 0.86 0.84 0.81  < > = values in this interval not displayed. Estimated Creatinine Clearance: 162.2 mL/min (by C-G formula based on Cr of 0.81).  Recent Labs  03/28/14 0832  VANCOTROUGH 8.1*     Microbiology: Recent Results (from the past 720 hour(s))  Culture, blood (routine x 2)     Status: None (Preliminary result)   Collection Time: 03/26/14 11:30 PM  Result Value Ref Range Status   Specimen Description BLOOD RIGHT ANTECUBITAL  Final   Special Requests BOTTLES DRAWN AEROBIC AND ANAEROBIC 5CC EACH  Final   Culture  Setup Time   Final    03/27/2014 10:28 [facility]formed at [facility]    Culture   Final           BLOOD CULTURE RECEIVED NO GROWTH TO DATE CULTURE WILL BE HELD FOR 5 DAYS BEFORE ISSUING A FINAL NEGATIVE REPORT Performed at [facility]    Report Status PENDING  Incomplete  Culture, blood (routine x 2)     Status: None (Preliminary result)   Collection Time: 03/26/14 11:45 PM  Result Value Ref Range Status   Specimen Description BLOOD LEFT ANTECUBITAL  Final   Special Requests BOTTLES DRAWN AEROBIC AND ANAEROBIC 5CC EACH  Final   Culture  Setup Time   Final   03/27/2014 10:28 Performed at [facility]    Culture   Final           BLOOD CULTURE RECEIVED NO GROWTH TO DATE CULTURE WILL BE HELD FOR 5 DAYS BEFORE ISSUING A FINAL NEGATIVE REPORT Performed at [facility]    Report Status PENDING  Incomplete  MRSA PCR Screening     Status: None   Collection Time: 03/27/14  2:21 AM  Result Value Ref Range Status   MRSA by PCR NEGATIVE NEGATIVE Final    Comment:        The GeneXpert MRSA Assay (FDA approved for NASAL specimens only), is one component of a comprehensive MRSA colonization surveillance program. It is not intended to diagnose MRSA infection nor to guide or monitor treatment for MRSA infections.     Anti-infectives    Start     Dose/Rate Route Frequency Ordered Stop   03/28/14 1700  vancomycin (VANCOCIN) 1,500 mg in sodium chloride 0.9 % 500 mL IVPB     1,500 mg250 mL/hr over 120 Minutes Intravenous Every 8 hours 03/28/14 1056     03/27/14 1000  cefTRIAXone (ROCEPHIN) 2 g in dextrose 5 % 50 mL IVPB - Premix     2 g100 mL/hr over 30 Minutes Intravenous Every 12  hours 03/27/14 0842     03/27/14 0600  vancomycin (VANCOCIN) IVPB 1000 mg/200 mL premix  Status:  Discontinued     1,000 mg200 mL/hr over 60 Minutes Intravenous Every 8 hours 03/27/14 0312 03/28/14 1056   03/27/14 0600  piperacillin-tazobactam (ZOSYN) IVPB 3.375 g  Status:  Discontinued     3.375 g12.5 mL/hr over 240 Minutes Intravenous 3 times per day 03/27/14 0312 03/27/14 0842   03/27/14 0000  vancomycin (VANCOCIN) IVPB 1000 mg/200 mL premix     1,000 mg200 mL/hr over 60 Minutes Intravenous  Once 03/26/14 2359 03/27/14 0116   03/26/14 2345  piperacillin-tazobactam (ZOSYN) IVPB 3.375 g     3.375 g100 mL/hr over 30 Minutes Intravenous  Once 03/26/14 2336 03/27/14 0047      Assessment: 28 y/o male who presented to the ED on 12/14 with chest pain found to be tachycardic with an elevated lactic acid. He was started on vancomycin and ceftriaxone for r/o  sepsis. Vancomycin trough was SUBtherapeutic this morning. It resulted at 8.1 but was drawn ~3 hrs late and took over 2 hrs to result (SZP filed). It is estimated the true trough was closer to ~10.5 using a CrCl ~100 ml/min. Tmax is 99.7, WBC are normal.  12/15 Vanc >> 12/15 Zosyn >>12/15 12/15 Ceftriaxone >>  12/14 BCx2 -   Goal of Therapy:  Vancomycin trough level 15-20 mcg/ml  Plan:  - Increase vancomycin to 1500 mg IV q8h - Trough at steady-state - Monitor renal function, clinical course and culture data  [facility] CenterJennifer Bayonne, Pharm.D., BCPS Clinical Pharmacist Pager: [phone] 03/28/2014 10:58 AM

## 2014-03-28 NOTE — Progress Notes (Signed)
PULMONARY / CRITICAL CARE MEDICINE   Name: Nitin Mckowen MRN: [id number] DOB: Sep 23, 1985    ADMISSION DATE:  03/26/2014 CONSULTATION DATE:  03/26/2014  REFERRING MD :  [facility] ED[facility]  CHIEF COMPLAINT:  Chest pain  INITIAL PRESENTATION: 28 year old male with hx HTN and ?hx IV drug use (POS opiates on admit) presented to [facility] ED 12/14 complaining of chest pain, headache, SOB and palpitations.  He was noted to be tachycardic, with elevated lactic acid (9). This improved somewhat with IVF resuscitation. He was transferred to [facility] for ICU admission. PCCM to assume care.   STUDIES:  MRI head/neck 12/16>>> NEG   SIGNIFICANT EVENTS: 12/15-  clearance lactic acid  SUBJECTIVE: requiring pain meds every 4h C/o diffuse pain Afebrile Feels 50% better   VITAL SIGNS: Temp:  [98.2 F (36.8 C)-99.7 F (37.6 C)] 98.2 F (36.8 C) (12/16 0435) Pulse Rate:  [112-128] 114 (12/16 0435) Resp:  [19-31] 19 (12/16 0435) BP: (111-153)/(57-91) 134/76 mmHg (12/16 0435) SpO2:  [93 %-100 %] 95 % (12/16 0435) Weight:  [208 lb 5.4 oz (94.5 kg)] 208 lb 5.4 oz (94.5 kg) (12/16 0131) HEMODYNAMICS:   VENTILATOR SETTINGS:   INTAKE / OUTPUT:  Intake/Output Summary (Last 24 hours) at 03/28/14 1216 Last data filed at 03/28/14 1007  Gross per 24 hour  Intake 3577.5 ml  Output   5425 ml  Net -1847.5 ml    PHYSICAL EXAMINATION: General:  Young male of normal body habitus in NAD Neuro:  Alert, oriented, non-focal. No nuchal rigidity HEENT:  [place]/AT, PERRL, no JVD noted  Cardiovascular:  Tachy, regular, no MRG noted Lungs:  Clear bilateral breath sounds Abdomen:  Soft, non-tender, non-distended Musculoskeletal:  No acute deformity or ROM limitation.  Skin:  Intact  LABS:  CBC  Recent Labs Lab 03/26/14 2146 03/28/14 0832  WBC 12.1* 9.1  HGB 15.5 13.5  HCT 45.8 39.7  PLT 321 266   Coag's No results for input(s): APTT, INR in the last 168 hours. BMET  Recent Labs Lab 03/27/14 0930 03/27/14 2010  03/28/14 0832  NA 143 138 138  K 3.5* 4.0 4.0  CL 113* 107 104  CO2 17* 21 22  BUN 4* 3* <3*  CREATININE 0.86 0.84 0.81  GLUCOSE 114* 132* 115*   Electrolytes  Recent Labs Lab 03/27/14 0930 03/27/14 2010 03/28/14 0832  CALCIUM 7.2* 8.1* 8.4  MG 1.7 2.4 2.2  PHOS 2.3 1.5* 3.0   Sepsis Markers  Recent Labs Lab 03/27/14 0015 03/27/14 0406 03/27/14 0407 03/28/14 0832  LATICACIDVEN 6.68* 3.6*  --  0.8  PROCALCITON  --   --  <0.10  --    ABG No results for input(s): PHART, PCO2ART, PO2ART in the last 168 hours. Liver Enzymes  Recent Labs Lab 03/26/14 2141  AST 31  ALT 18  ALKPHOS 68  BILITOT <0.2*  ALBUMIN 4.2   Cardiac Enzymes  Recent Labs Lab 03/26/14 2146  TROPONINI <0.30   Glucose  Recent Labs Lab 03/27/14 0212  GLUCAP 119*    Imaging Dg Chest 2 View  03/27/2014   CLINICAL DATA:  Chest pain  EXAM: CHEST  2 VIEW  COMPARISON:  None.  FINDINGS: Normal heart size and mediastinal contours. No acute infiltrate or edema. No effusion or pneumothorax. No acute osseous findings.  IMPRESSION: No active cardiopulmonary disease.   Electronically Signed   By: Tiburcio Pea M.D.   On: 03/27/2014 01:35   Mr Brain Wo Contrast  03/27/2014   CLINICAL DATA:  Headache.  Neck pain.  Hypertension  EXAM: MRI HEAD WITHOUT CONTRAST  MRI CERVICAL SPINE WITHOUT CONTRAST  TECHNIQUE: Multiplanar, multiecho pulse sequences of the brain and surrounding structures, and cervical spine, to include the craniocervical junction and cervicothoracic junction, were obtained without intravenous contrast.  COMPARISON:  None.  FINDINGS: MRI HEAD FINDINGS  Ventricle size is normal. Cerebral volume is normal. Craniocervical junction is normal. Negative for Chiari malformation. Pituitary normal in size.  Negative for acute or chronic infarction  Negative for demyelinating disease.  Negative for hemorrhage or mass lesion. No edema or shift of the midline structures.  Mild mucosal edema in the  paranasal sinuses.  MRI CERVICAL SPINE FINDINGS  Image quality degraded by motion. Patient was not able to complete the last sequence of axial gradient images. The level of motion could obscure certain lesions.  Normal cervical alignment. Straightening of the cervical lordosis with mild kyphosis. Negative for fracture or mass  Spinal cord signal is normal. No cord compression or cord lesion is identified.  Negative for disc protrusion. Negative for spinal stenosis or neural impingement.  IMPRESSION: Negative MRI of the brain  Mild chronic sinusitis  Negative MRI of the cervical spine. The patient was not able to hold still and there is significant motion on the cervical spine study.   Electronically Signed   By: Marlan Palauharles  Clark M.D.   On: 03/27/2014 13:01   Mr Cervical Spine Wo Contrast  03/27/2014   CLINICAL DATA:  Headache.  Neck pain.  Hypertension  EXAM: MRI HEAD WITHOUT CONTRAST  MRI CERVICAL SPINE WITHOUT CONTRAST  TECHNIQUE: Multiplanar, multiecho pulse sequences of the brain and surrounding structures, and cervical spine, to include the craniocervical junction and cervicothoracic junction, were obtained without intravenous contrast.  COMPARISON:  None.  FINDINGS: MRI HEAD FINDINGS  Ventricle size is normal. Cerebral volume is normal. Craniocervical junction is normal. Negative for Chiari malformation. Pituitary normal in size.  Negative for acute or chronic infarction  Negative for demyelinating disease.  Negative for hemorrhage or mass lesion. No edema or shift of the midline structures.  Mild mucosal edema in the paranasal sinuses.  MRI CERVICAL SPINE FINDINGS  Image quality degraded by motion. Patient was not able to complete the last sequence of axial gradient images. The level of motion could obscure certain lesions.  Normal cervical alignment. Straightening of the cervical lordosis with mild kyphosis. Negative for fracture or mass  Spinal cord signal is normal. No cord compression or cord lesion is  identified.  Negative for disc protrusion. Negative for spinal stenosis or neural impingement.  IMPRESSION: Negative MRI of the brain  Mild chronic sinusitis  Negative MRI of the cervical spine. The patient was not able to hold still and there is significant motion on the cervical spine study.   Electronically Signed   By: Marlan Palauharles  Clark M.D.   On: 03/27/2014 13:01     ASSESSMENT / PLAN:  PULMONARY A: Cough P:   mucinex prn pulm hygiene   CARDIOVASCULAR A:  Sinus tachycardia - improved but not resolved  QTC prolongation- improved.  P:  Can dc Tele Decrease IVF but cont gentle hydration  2D echo pending  Lactate cleared  RENAL A:   High AG metabolic acidosis- improved.  Hypokalemia, HYpomag P:   Replete electrolytes  Trend bmet  Gentle hydration with 0.45NS , watch Na    HEMATOLOGIC A:   DVT prevention P:  Monitor CBC SQ heparin   INFECTIOUS A:   SIRS ? Sepsis. Etiology unclear at this  point.  Neurostatus does not support Encephalitis R/o epidural abscess, high risk with IVDA P:   BCx2 12/14 > UC 12/14 > Abx: Pip/tazo, start date 12/15>>>12/15 Ceftriaxone 12/15>>> Abx: Vancomycin, start date 12/15 Check PCT - neg HIV>>NEG  MRI head/neck NEG  Doubt need further imaging thoracic spine  for epidural abscess - consider if staph in blood cx    ENDOCRINE A:   No acute issues  P:   Monitor glucose on chemistry  NEUROLOGIC A:   ETOH abuse, concern for withdrawal (last drink 12/13) H/o Drug abuse, r/o epidural abscess Pain management  P:   CIWA protocol  Monitor PRN dilaudid for pain, add percocet prn   Summary - Viral syndrome most likely, with drug use - no bacteremia so far  Oretha MilchALVA,RAKESH V. MD

## 2014-03-28 NOTE — Progress Notes (Signed)
Pt c/o headaches. Attempting to find adequate pain meds. Tyl #3 and ultram makes him nauseous. Prefers iv dilaudid

## 2014-03-29 DIAGNOSIS — E872 Acidosis, unspecified: Secondary | ICD-10-CM | POA: Diagnosis present

## 2014-03-29 LAB — PHOSPHORUS: Phosphorus: 4.1 mg/dL (ref 2.3–4.6)

## 2014-03-29 LAB — CBC
HEMATOCRIT: 41.3 % (ref 39.0–52.0)
HEMOGLOBIN: 13.7 g/dL (ref 13.0–17.0)
MCH: 30.9 pg (ref 26.0–34.0)
MCHC: 33.2 g/dL (ref 30.0–36.0)
MCV: 93.2 fL (ref 78.0–100.0)
Platelets: 272 10*3/uL (ref 150–400)
RBC: 4.43 MIL/uL (ref 4.22–5.81)
RDW: 13.4 % (ref 11.5–15.5)
WBC: 8 10*3/uL (ref 4.0–10.5)

## 2014-03-29 LAB — BASIC METABOLIC PANEL
Anion gap: 14 (ref 5–15)
BUN: 3 mg/dL — ABNORMAL LOW (ref 6–23)
CHLORIDE: 105 meq/L (ref 96–112)
CO2: 23 meq/L (ref 19–32)
Calcium: 9.2 mg/dL (ref 8.4–10.5)
Creatinine, Ser: 0.88 mg/dL (ref 0.50–1.35)
GFR calc non Af Amer: >90 mL/min (ref >90)
Glucose, Bld: 88 mg/dL (ref 70–99)
POTASSIUM: 4.4 meq/L (ref 3.7–5.3)
SODIUM: 142 meq/L (ref 137–147)

## 2014-03-29 LAB — GLUCOSE, CAPILLARY
GLUCOSE-CAPILLARY: 93 mg/dL (ref 70–99)
Glucose-Capillary: 81 mg/dL (ref 70–99)

## 2014-03-29 LAB — MAGNESIUM: MAGNESIUM: 2 mg/dL (ref 1.5–2.5)

## 2014-03-29 NOTE — Progress Notes (Signed)
  Echocardiogram 2D Echocardiogram has been performed.  Cathie BeamsGREGORY, Kellye Mizner 03/29/2014, 2:13 PM

## 2014-03-29 NOTE — Progress Notes (Signed)
Patient ID: Grant Tapia  male  ZOX:[id number]    DOB: 06/30/1985    DOA: 03/26/2014  PCP: No PCP Per Patient   Brief history of present illness Patient is a 28 year old male with hypertension although not on any antihypertensives presented to [facility] [place] on 12/14 with chest pain, located in left chest, pleuritic and radiated to head. Patient reported pain as sharp pressure like and throbbing. Patient was noticed to have palpitations and tachycardiac and 130s. Patient reported being short of breath and pleuritic chest pain. Patient also admitted to having headache, dizziness, weakness and had one episode of vomiting. Patient's lactic acid was elevated at 9, improved somewhat with IVF resuscitation. Patient was transferred to [facility] to ICU admission.   Assessment/Plan: Principal Problem:   Sepsis with lactic acidosis: Etiology unclear, possibly viral illness, acute on chronic sinusitis. Neuro status does not support encephalitis. - Blood cultures has been negative, lactic acid has improved to 0.8 with aggressive IV fluid hydration at the time of admission. Chest x-ray was negative. MRI of the brain showed mild chronic sinusitis but negative MRI of the brain and cervical spine. Urine toxicology screen was positive for opiates only. HIV nonreactive. D-dimer negative. Per critical care, if blood cultures come back positive and has staph, may need imaging of the thoracic spine for ruling out epidural abscess. Pro-calcitonin less than 0.1. -2-D echo pending, Has a history of IV drug abuse - Continue IV antibiotics for now, if blood cultures negative tomorrow, will DC home   Active Problems: High anion gap metabolic acidosis with hypokalemia, hypomagnesemia - Currently improved with IV fluids     atypical pleuritic Chest pain resolved -2-D echo pending    Headache - Improving with Percocet - Currently on IV vancomycin and Rocephin   DVT Prophylaxis:  Code Status:Full  code  Family Communication:  Disposition:  Consultants: Patient was admitted by critical care service   Procedures: None  Antibiotics:  IV vancomycin 12/16, IV Rocephin    Subjective: Patient seen and examined, states headache is improving with Percocet, no fevers or chills, nausea or vomiting.   Objective: Weight change: -7.48 kg (-16 lb 7.9 oz)  Intake/Output Summary (Last 24 hours) at 03/29/14 1102 Last data filed at 03/29/14 0919  Gross per 24 hour  Intake   2665 ml  Output   3375 ml  Net   -710 ml   Blood pressure 133/81, pulse 100, temperature 98.4 F (36.9 C), temperature source Oral, resp. rate 18, height 5\' 11"  (1.803 m), weight 87.02 kg (191 lb 13.5 oz), SpO2 98 %.  Physical Exam: General: Alert and awake, oriented x3, not in any acute distress. HEENT: anicteric sclera, PERLA, EOMI CVS: S1-S2 clear, no murmur rubs or gallops Chest: clear to auscultation bilaterally, no wheezing, rales or rhonchi Abdomen: soft nontender, nondistended, normal bowel sounds  Extremities: no cyanosis, clubbing or edema noted bilaterally Neuro: Cranial nerves II-XII intact, no focal neurological deficits  Lab Results: Basic Metabolic Panel:  Recent Labs Lab 03/28/14 1550 03/29/14 0540  NA 136* 142  K 5.7* 4.4  CL 102 105  CO2 22 23  GLUCOSE 84 88  BUN 3* 3*  CREATININE 0.76 0.88  CALCIUM 8.7 9.2  MG  --  2.0  PHOS  --  4.1   Liver Function Tests:  Recent Labs Lab 03/26/14 2141  AST 31  ALT 18  ALKPHOS 68  BILITOT <0.2*  PROT 8.4*  ALBUMIN 4.2    Recent Labs Lab 03/27/14  0407  LIPASE 28   No results for input(s): AMMONIA in the last 168 hours. CBC:  Recent Labs Lab 03/28/14 0832 03/29/14 0540  WBC 9.1 8.0  NEUTROABS 6.7  --   HGB 13.5 13.7  HCT 39.7 41.3  MCV 92.8 93.2  PLT 266 272   Cardiac Enzymes:  Recent Labs Lab 03/26/14 2146  TROPONINI <0.30   BNP: Invalid input(s): POCBNP CBG:  Recent Labs Lab 03/27/14 0212  GLUCAP  119*     Micro Results: Recent Results (from the past 240 hour(s))  Culture, blood (routine x 2)     Status: None (Preliminary result)   Collection Time: 03/26/14 11:30 PM  Result Value Ref Range Status   Specimen Description BLOOD RIGHT ANTECUBITAL  Final   Special Requests BOTTLES DRAWN AEROBIC AND ANAEROBIC 5CC EACH  Final   Culture  Setup Time   Final    03/27/2014 10:28 Performed at [facility]    Culture   Final           BLOOD CULTURE RECEIVED NO GROWTH TO DATE CULTURE WILL BE HELD FOR 5 DAYS BEFORE ISSUING A FINAL NEGATIVE REPORT Performed at [facility]    Report Status PENDING  Incomplete  Culture, blood (routine x 2)     Status: None (Preliminary result)   Collection Time: 03/26/14 11:45 PM  Result Value Ref Range Status   Specimen Description BLOOD LEFT ANTECUBITAL  Final   Special Requests BOTTLES DRAWN AEROBIC AND ANAEROBIC 5CC EACH  Final   Culture  Setup Time   Final    03/27/2014 10:28 Performed at [facility]    Culture   Final           BLOOD CULTURE RECEIVED NO GROWTH TO DATE CULTURE WILL BE HELD FOR 5 DAYS BEFORE ISSUING A FINAL NEGATIVE REPORT Performed at [facility]    Report Status PENDING  Incomplete  MRSA PCR Screening     Status: None   Collection Time: 03/27/14  2:21 AM  Result Value Ref Range Status   MRSA by PCR NEGATIVE NEGATIVE Final    Comment:        The GeneXpert MRSA Assay (FDA approved for NASAL specimens only), is one component of a comprehensive MRSA colonization surveillance program. It is not intended to diagnose MRSA infection nor to guide or monitor treatment for MRSA infections.     Studies/Results: Dg Chest 2 View  03/27/2014   CLINICAL DATA:  Chest pain  EXAM: CHEST  2 VIEW  COMPARISON:  None.  FINDINGS: Normal heart size and mediastinal contours. No acute infiltrate or edema. No effusion or pneumothorax. No acute osseous findings.  IMPRESSION: No active cardiopulmonary disease.    Electronically Signed   By: Tiburcio PeaJonathan  Watts M.D.   On: 03/27/2014 01:35   Mr Brain Wo Contrast  03/27/2014   CLINICAL DATA:  Headache.  Neck pain.  Hypertension  EXAM: MRI HEAD WITHOUT CONTRAST  MRI CERVICAL SPINE WITHOUT CONTRAST  TECHNIQUE: Multiplanar, multiecho pulse sequences of the brain and surrounding structures, and cervical spine, to include the craniocervical junction and cervicothoracic junction, were obtained without intravenous contrast.  COMPARISON:  None.  FINDINGS: MRI HEAD FINDINGS  Ventricle size is normal. Cerebral volume is normal. Craniocervical junction is normal. Negative for Chiari malformation. Pituitary normal in size.  Negative for acute or chronic infarction  Negative for demyelinating disease.  Negative for hemorrhage or mass lesion. No edema or shift of the midline structures.  Mild  mucosal edema in the paranasal sinuses.  MRI CERVICAL SPINE FINDINGS  Image quality degraded by motion. Patient was not able to complete the last sequence of axial gradient images. The level of motion could obscure certain lesions.  Normal cervical alignment. Straightening of the cervical lordosis with mild kyphosis. Negative for fracture or mass  Spinal cord signal is normal. No cord compression or cord lesion is identified.  Negative for disc protrusion. Negative for spinal stenosis or neural impingement.  IMPRESSION: Negative MRI of the brain  Mild chronic sinusitis  Negative MRI of the cervical spine. The patient was not able to hold still and there is significant motion on the cervical spine study.   Electronically Signed   By: Marlan Palauharles  Clark M.D.   On: 03/27/2014 13:01   Mr Cervical Spine Wo Contrast  03/27/2014   CLINICAL DATA:  Headache.  Neck pain.  Hypertension  EXAM: MRI HEAD WITHOUT CONTRAST  MRI CERVICAL SPINE WITHOUT CONTRAST  TECHNIQUE: Multiplanar, multiecho pulse sequences of the brain and surrounding structures, and cervical spine, to include the craniocervical junction and  cervicothoracic junction, were obtained without intravenous contrast.  COMPARISON:  None.  FINDINGS: MRI HEAD FINDINGS  Ventricle size is normal. Cerebral volume is normal. Craniocervical junction is normal. Negative for Chiari malformation. Pituitary normal in size.  Negative for acute or chronic infarction  Negative for demyelinating disease.  Negative for hemorrhage or mass lesion. No edema or shift of the midline structures.  Mild mucosal edema in the paranasal sinuses.  MRI CERVICAL SPINE FINDINGS  Image quality degraded by motion. Patient was not able to complete the last sequence of axial gradient images. The level of motion could obscure certain lesions.  Normal cervical alignment. Straightening of the cervical lordosis with mild kyphosis. Negative for fracture or mass  Spinal cord signal is normal. No cord compression or cord lesion is identified.  Negative for disc protrusion. Negative for spinal stenosis or neural impingement.  IMPRESSION: Negative MRI of the brain  Mild chronic sinusitis  Negative MRI of the cervical spine. The patient was not able to hold still and there is significant motion on the cervical spine study.   Electronically Signed   By: Marlan Palauharles  Clark M.D.   On: 03/27/2014 13:01   Dg Chest Port 1 View  03/28/2014   CLINICAL DATA:  Chest pain, shortness of breath  EXAM: PORTABLE CHEST - 1 VIEW  COMPARISON:  Portable chest x-ray of 03/26/2014  FINDINGS: The lungs are not as well aerated with increasing atelectasis of bases left-greater-than-right. No effusion is seen. The heart is within upper limits of normal in view of the poor inspiration.  IMPRESSION: Poor inspiration chest x-ray with basilar atelectasis left-greater-than-right.   Electronically Signed   By: Dwyane DeePaul  Barry M.D.   On: 03/28/2014 07:55    Medications: Scheduled Meds: . cefTRIAXone (ROCEPHIN)  IV  2 g Intravenous Q12H  . heparin subcutaneous  5,000 Units Subcutaneous 3 times per day  . vancomycin  1,500 mg  Intravenous Q8H      LOS: 3 days   RAI,RIPUDEEP M.D. [facility] 03/29/2014, 11:02 AM Pager: [phone]  If 7PM-7AM, please contact night-coverage [email or web address] Password [email or web address]

## 2014-03-30 DIAGNOSIS — R079 Chest pain, unspecified: Secondary | ICD-10-CM

## 2014-03-30 LAB — BASIC METABOLIC PANEL
Anion gap: 10 (ref 5–15)
BUN: 7 mg/dL (ref 6–23)
CALCIUM: 9.3 mg/dL (ref 8.4–10.5)
CO2: 26 mEq/L (ref 19–32)
Chloride: 106 mEq/L (ref 96–112)
Creatinine, Ser: 0.9 mg/dL (ref 0.50–1.35)
GFR calc Af Amer: >90 mL/min (ref >90)
GFR calc non Af Amer: >90 mL/min (ref >90)
GLUCOSE: 97 mg/dL (ref 70–99)
POTASSIUM: 4.1 meq/L (ref 3.7–5.3)
SODIUM: 142 meq/L (ref 137–147)

## 2014-03-30 LAB — CBC
HCT: 39.2 % (ref 39.0–52.0)
Hemoglobin: 12.7 g/dL — ABNORMAL LOW (ref 13.0–17.0)
MCH: 30.4 pg (ref 26.0–34.0)
MCHC: 32.4 g/dL (ref 30.0–36.0)
MCV: 93.8 fL (ref 78.0–100.0)
PLATELETS: 290 10*3/uL (ref 150–400)
RBC: 4.18 MIL/uL — AB (ref 4.22–5.81)
RDW: 13.4 % (ref 11.5–15.5)
WBC: 6.5 10*3/uL (ref 4.0–10.5)

## 2014-03-30 MED ORDER — PROMETHAZINE HCL 12.5 MG PO TABS: 12.5000 mg | ORAL_TABLET | Freq: Four times a day (QID) | ORAL | Status: DC | PRN

## 2014-03-30 MED ORDER — OXYCODONE-ACETAMINOPHEN 5-325 MG PO TABS: 1.0000 | ORAL_TABLET | Freq: Four times a day (QID) | ORAL | Status: DC | PRN

## 2014-03-30 NOTE — Care Management Note (Signed)
    Page 1 of 1   03/30/2014     3:53:02 PM CARE MANAGEMENT NOTE 03/30/2014  Patient:  Grant Tapia,Grant Tapia   Account Number:  [id number]  Date Initiated:  03/27/2014  Documentation initiated by:  Avie ArenasBROWN,SARAH  Subjective/Objective Assessment:   Admitted with sepsis - tachycardia.     Action/Plan:   Anticipated DC Date:  03/30/2014   Anticipated DC Plan:  HOME/SELF CARE      DC Planning Services  CM consult      Choice offered to / List presented to:             Status of service:  Completed, signed off Medicare Important Message given?  NO (If response is "NO", the following Medicare IM given date fields will be blank) Date Medicare IM given:   Medicare IM given by:   Date Additional Medicare IM given:   Additional Medicare IM given by:    Discharge Disposition:  HOME/SELF CARE  Per UR Regulation:  Reviewed for med. necessity/level of care/duration of stay  If discussed at Long Length of Stay Meetings, dates discussed:    Comments:  Contact:  Chambers,Breanna Significant other [phone]

## 2014-03-30 NOTE — Progress Notes (Signed)
Pt being d/c to home, instructions given, pt verbalized understanding, pt leaving with friend via wheelchair, pt stable

## 2014-03-30 NOTE — Discharge Summary (Signed)
Physician Discharge Summary  Patient ID: Grant Tapia MRN: [id number] DOB/AGE: Mar 03, 1986 28 y.o.  Admit date: 03/26/2014 Discharge date: 03/30/2014  Primary Care Physician:  No PCP Per Patient  Discharge Diagnoses:    . Sepsis likely acute viral febrile illness  . atypical Chest pain resolved  . Headache resolved  . Lactic acidosis due to dehydration  Consults: Patient was admitted by critical care service on 12/14    DIET: Regular diet  Allergies:  No Known Allergies   Discharge Medications:   Medication List    TAKE these medications        oxyCODONE-acetaminophen 5-325 MG per tablet  Commonly known as:  PERCOCET/ROXICET  Take 1 tablet by mouth every 6 (six) hours as needed for moderate pain.     promethazine 12.5 MG tablet  Commonly known as:  PHENERGAN  Take 1 tablet (12.5 mg total) by mouth every 6 (six) hours as needed for nausea or vomiting.         Brief H and P: For complete details please refer to admission H and P, but in brief Patient is a 28 year old male with hypertension although not on any antihypertensives presented to [facility] [place] [facility] on 12/14 with chest pain, located in left chest, pleuritic and radiated to head. Patient reported pain as sharp pressure like and throbbing. Patient was noticed to have palpitations and tachycardiac and 130s. Patient reported being short of breath and pleuritic chest pain. Patient also admitted to having headache, dizziness, weakness and had one episode of vomiting. Patient's lactic acid was elevated at 9, improved somewhat with IVF resuscitation. Patient was transferred to [facility] to ICU admission.   Hospital Course:  Sepsis with lactic acidosis: Etiology unclear, possibly acute viral illness, acute on chronic sinusitis. Neuro status does not support encephalitis. - Blood cultures has been negative, lactic acid has improved to 0.8 with aggressive IV fluid hydration at the time of admission. Chest x-ray was  negative. MRI of the brain showed mild chronic sinusitis but negative MRI of the brain and cervical spine. Urine toxicology screen was positive for opiates only. HIV nonreactive. D-dimer negative. Per critical care, if blood cultures come back positive and has staph, may need imaging of the thoracic spine for ruling out epidural abscess. Pro-calcitonin less than 0.1. Blood cultures remained negative to date, day #4. Given his history of IV drug abuse, 2-D echo was done, EF 50-55%, no regional wall motion abnormalities, no vegetations. IV antibiotics were discontinued at discharge as patient has not been febrile, no leukocytosis, cultures has all been negative.   High anion gap metabolic acidosis with hypokalemia, hypomagnesemia:  improved with IV fluid hydration and supplements, patient is not tolerating oral diet without any difficulty.    Atypical pleuritic Chest pain resolved -2-D echwas done which showed EF of 50-55% with no regional wall motion abnormalities, no vegetations., Currently resolved with pain medications.   Headache: Likely migraines, no signs of any encephalitis or meningitis  Improving with Percocet.     Day of Discharge BP 123/67 mmHg  Pulse 75  Temp(Src) 97.8 F (36.6 C) (Oral)  Resp 20  Ht 5\' 11"  (1.803 m)  Wt 88.27 kg (194 lb 9.6 oz)  BMI 27.15 kg/m2  SpO2 100%  Physical Exam: General: Alert and awake oriented x3 not in any acute distress. HEENT: anicteric sclera, pupils reactive to light and accommodation CVS: S1-S2 clear no murmur rubs or gallops Chest: clear to auscultation bilaterally, no wheezing rales or rhonchi Abdomen: soft nontender,  nondistended, normal bowel sounds Extremities: no cyanosis, clubbing or edema noted bilaterally Neuro: Cranial nerves II-XII intact, no focal neurological deficits   The results of significant diagnostics from this hospitalization (including imaging, microbiology, ancillary and laboratory) are listed below for  reference.    LAB RESULTS: Basic Metabolic Panel:  Recent Labs Lab 03/29/14 0540 03/30/14 0404  NA 142 142  K 4.4 4.1  CL 105 106  CO2 23 26  GLUCOSE 88 97  BUN 3* 7  CREATININE 0.88 0.90  CALCIUM 9.2 9.3  MG 2.0  --   PHOS 4.1  --    Liver Function Tests:  Recent Labs Lab 03/26/14 2141  AST 31  ALT 18  ALKPHOS 68  BILITOT <0.2*  PROT 8.4*  ALBUMIN 4.2    Recent Labs Lab 03/27/14 0407  LIPASE 28   No results for input(s): AMMONIA in the last 168 hours. CBC:  Recent Labs Lab 03/28/14 0832 03/29/14 0540 03/30/14 0404  WBC 9.1 8.0 6.5  NEUTROABS 6.7  --   --   HGB 13.5 13.7 12.7*  HCT 39.7 41.3 39.2  MCV 92.8 93.2 93.8  PLT 266 272 290   Cardiac Enzymes:  Recent Labs Lab 03/26/14 2146  TROPONINI <0.30   BNP: Invalid input(s): POCBNP CBG:  Recent Labs Lab 03/29/14 1135 03/29/14 1638  GLUCAP 93 81    Significant Diagnostic Studies:  Dg Chest 2 View  03/27/2014   CLINICAL DATA:  Chest pain  EXAM: CHEST  2 VIEW  COMPARISON:  None.  FINDINGS: Normal heart size and mediastinal contours. No acute infiltrate or edema. No effusion or pneumothorax. No acute osseous findings.  IMPRESSION: No active cardiopulmonary disease.   Electronically Signed   By: Tiburcio Pea M.D.   On: 03/27/2014 01:35   Mr Brain Wo Contrast  03/27/2014   CLINICAL DATA:  Headache.  Neck pain.  Hypertension  EXAM: MRI HEAD WITHOUT CONTRAST  MRI CERVICAL SPINE WITHOUT CONTRAST  TECHNIQUE: Multiplanar, multiecho pulse sequences of the brain and surrounding structures, and cervical spine, to include the craniocervical junction and cervicothoracic junction, were obtained without intravenous contrast.  COMPARISON:  None.  FINDINGS: MRI HEAD FINDINGS  Ventricle size is normal. Cerebral volume is normal. Craniocervical junction is normal. Negative for Chiari malformation. Pituitary normal in size.  Negative for acute or chronic infarction  Negative for demyelinating disease.  Negative  for hemorrhage or mass lesion. No edema or shift of the midline structures.  Mild mucosal edema in the paranasal sinuses.  MRI CERVICAL SPINE FINDINGS  Image quality degraded by motion. Patient was not able to complete the last sequence of axial gradient images. The level of motion could obscure certain lesions.  Normal cervical alignment. Straightening of the cervical lordosis with mild kyphosis. Negative for fracture or mass  Spinal cord signal is normal. No cord compression or cord lesion is identified.  Negative for disc protrusion. Negative for spinal stenosis or neural impingement.  IMPRESSION: Negative MRI of the brain  Mild chronic sinusitis  Negative MRI of the cervical spine. The patient was not able to hold still and there is significant motion on the cervical spine study.   Electronically Signed   By: Marlan Palau M.D.   On: 03/27/2014 13:01   Mr Cervical Spine Wo Contrast  03/27/2014   CLINICAL DATA:  Headache.  Neck pain.  Hypertension  EXAM: MRI HEAD WITHOUT CONTRAST  MRI CERVICAL SPINE WITHOUT CONTRAST  TECHNIQUE: Multiplanar, multiecho pulse sequences of the brain and surrounding  structures, and cervical spine, to include the craniocervical junction and cervicothoracic junction, were obtained without intravenous contrast.  COMPARISON:  None.  FINDINGS: MRI HEAD FINDINGS  Ventricle size is normal. Cerebral volume is normal. Craniocervical junction is normal. Negative for Chiari malformation. Pituitary normal in size.  Negative for acute or chronic infarction  Negative for demyelinating disease.  Negative for hemorrhage or mass lesion. No edema or shift of the midline structures.  Mild mucosal edema in the paranasal sinuses.  MRI CERVICAL SPINE FINDINGS  Image quality degraded by motion. Patient was not able to complete the last sequence of axial gradient images. The level of motion could obscure certain lesions.  Normal cervical alignment. Straightening of the cervical lordosis with mild  kyphosis. Negative for fracture or mass  Spinal cord signal is normal. No cord compression or cord lesion is identified.  Negative for disc protrusion. Negative for spinal stenosis or neural impingement.  IMPRESSION: Negative MRI of the brain  Mild chronic sinusitis  Negative MRI of the cervical spine. The patient was not able to hold still and there is significant motion on the cervical spine study.   Electronically Signed   By: Marlan Palauharles  Clark M.D.   On: 03/27/2014 13:01    2D ECHO:  Study Conclusions  - Left ventricle: The cavity size was normal. Wall thickness was increased in a pattern of mild LVH. Systolic function was normal. The estimated ejection fraction was in the range of 50% to 55%. Wall motion was normal; there were no regional wall motion abnormalities.  Disposition and Follow-up:     Discharge Instructions    Diet general    Complete by:  As directed      Increase activity slowly    Complete by:  As directed             DISPOSITION: Home   DISCHARGE FOLLOW-UP Follow-up Information    Follow up with [facility]    . Schedule an appointment as soon as possible for a visit in 2 weeks.   Why:  As needed, If symptoms worsen, for hospital follow-up   Contact information:   [address] [place] [id number] [phone]       Time spent on Discharge: 36 mins  Signed:   RAI,RIPUDEEP M.D. [facility] 03/30/2014, 10:49 AM Pager: [phone]

## 2014-04-02 LAB — CULTURE, BLOOD (ROUTINE X 2)
CULTURE: NO GROWTH
Culture: NO GROWTH

## 2015-08-30 ENCOUNTER — Encounter (HOSPITAL_COMMUNITY): Payer: Self-pay | Admitting: Emergency Medicine

## 2015-08-30 ENCOUNTER — Emergency Department (HOSPITAL_COMMUNITY)
Admission: EM | Admit: 2015-08-30 | Discharge: 2015-08-30 | Disposition: A | Discharge status: Home / Self Care | Payer: Self-pay | Attending: Emergency Medicine | Admitting: Emergency Medicine

## 2015-08-30 DIAGNOSIS — I10 Essential (primary) hypertension: Secondary | ICD-10-CM | POA: Insufficient documentation

## 2015-08-30 DIAGNOSIS — T401X1A Poisoning by heroin, accidental (unintentional), initial encounter: Secondary | ICD-10-CM | POA: Insufficient documentation

## 2015-08-30 MED ORDER — ACETAMINOPHEN 500 MG PO TABS
1000.0000 mg | ORAL_TABLET | Freq: Once | ORAL | Status: AC
  Administered 2015-08-30: 1000 mg via ORAL
  Filled 2015-08-30: qty 2

## 2015-08-30 NOTE — ED Provider Notes (Signed)
CSN: [id number]     Arrival date & time 08/30/15  1507 History   First MD Initiated Contact with Patient 08/30/15 1538     No chief complaint on file.     HPI  Patient presents for evaluation after Narcan rescue from heroin overdose.  Patient states that he remembers being at work. He remembers going out to his car and shooting up. He was being driven home by "friend from work". He states he remembers feeling lightheaded and dizzy. Upon arrival at his house his girlfriend came out states that he was mumbling and "almost unconscious". She called paramedics. They arrived and patient was given Narcan. He was not completely apneic. He had quick reversal with Narcan was awake and alert en route.  Patient states that he has been using heroin for about a year. He's never had a "reaction like this before". On days he does not use he does not have withdrawal symptoms such as diaphoresis cramps or diarrhea.  Past Medical History  Diagnosis Date  . Hypertension    History reviewed. No pertinent past surgical history. No family history on file. Social History  Substance Use Topics  . Smoking status: Never Smoker   . Smokeless tobacco: None  . Alcohol Use: Yes    Review of Systems  Constitutional: Negative for fever, chills, diaphoresis, appetite change and fatigue.  HENT: Negative for mouth sores, sore throat and trouble swallowing.   Eyes: Negative for visual disturbance.  Respiratory: Negative for cough, chest tightness, shortness of breath and wheezing.   Cardiovascular: Negative for chest pain.  Gastrointestinal: Negative for nausea, vomiting, abdominal pain, diarrhea and abdominal distention.  Endocrine: Negative for polydipsia, polyphagia and polyuria.  Genitourinary: Negative for dysuria, frequency and hematuria.  Musculoskeletal: Negative for gait problem.  Skin: Negative for color change, pallor and rash.  Neurological: Positive for headaches. Negative for dizziness, syncope and  light-headedness.  Hematological: Does not bruise/bleed easily.  Psychiatric/Behavioral: Negative for behavioral problems and confusion.      Allergies  Review of patient's allergies indicates no known allergies.  Home Medications   Prior to Admission medications   Not on File   BP 142/92 mmHg  Pulse 103  Temp(Src) 97.6 F (36.4 C) (Oral)  Resp 14  SpO2 95% Physical Exam  Constitutional: He is oriented to person, place, and time. He appears well-developed and well-nourished. No distress.  Awake and alert. Oriented lucid. Moving all 4 tremors. Well oxygenated. Clear lungs. Normal sats.  HENT:  Head: Normocephalic.  Eyes: Conjunctivae are normal. Pupils are equal, round, and reactive to light. No scleral icterus.  Neck: Normal range of motion. Neck supple. No thyromegaly present.  Cardiovascular: Normal rate and regular rhythm.  Exam reveals no gallop and no friction rub.   No murmur heard. Pulmonary/Chest: Effort normal and breath sounds normal. No respiratory distress. He has no wheezes. He has no rales.  Abdominal: Soft. Bowel sounds are normal. He exhibits no distension. There is no tenderness. There is no rebound.  Musculoskeletal: Normal range of motion.  Neurological: He is alert and oriented to person, place, and time.  Skin: Skin is warm and dry. No rash noted.  Psychiatric: He has a normal mood and affect. His behavior is normal.    ED Course  Procedures (including critical care time) Labs Review Labs Reviewed - No data to display  Imaging Review No results found. I have personally reviewed and evaluated these images and lab results as part of my medical decision-making.  EKG Interpretation None      MDM   Final diagnoses:  Heroin overdose, accidental or unintentional, initial encounter    He is observed for 2 hours here. He remains awake and alert. He is ambulatory. Normal saturations and normal exam. Had a long frank discussion with he and his  girlfriend about narcotic use. Given multiple outpatient resources regarding inpatient, outpatient, and residential treatment centers.    Rolland PorterMark Ianmichael Amescua, MD 08/30/15 [phone]

## 2015-08-30 NOTE — ED Notes (Signed)
MD at bedside. 

## 2015-08-30 NOTE — Discharge Instructions (Signed)
People die from Heroin overdose every day. Today, without Narcan given by EMS, you likely would have died. Stop using Heroin. Call one of the treatment facilities listed below.  Narcotic Overdose A narcotic overdose is the misuse or overuse of a narcotic drug. A narcotic overdose can make you pass out and stop breathing. If you are not treated right away, this can cause permanent brain damage or stop your heart. Medicine may be given to reverse the effects of an overdose. If so, this medicine may bring on withdrawal symptoms. The symptoms may be abdominal cramps, throwing up (vomiting), sweating, chills, and nervousness. Injecting narcotics can cause more problems than just an overdose. AIDS, hepatitis, and other very serious infections are transmitted by sharing needles and syringes. If you decide to quit using, there are medicines which can help you through the withdrawal period. Trying to quit all at once on your own can be uncomfortable, but not life-threatening. Call your caregiver, Narcotics Anonymous, or any drug and alcohol treatment program for further help.    This information is not intended to replace advice given to you by your health care provider. Make sure you discuss any questions you have with your health care provider.   Document Released: 05/07/2004 Document Revised: 04/20/2014 Document Reviewed: 09/19/2014 Elsevier Interactive Patient Education 2016 [employer]. [employer] Guide Inpatient Behavioral Health/Residential  Substance Abuse Treatment Adults The United Ways 211 is a great source of information about community services available.  Access by dialing 2-1-1 from anywhere in [place], or by website -  PooledIncome.pl.   (Updated 04/2015)  Crisis Assistance 24 hours a day   Services Offered    Area [employer]  24-hour crisis assistance: [phone] [place], [place]   [facility]  24-hour crisis  [phone] [place], [place]  [facility]   24-hour crisis assistance: [phone] [place], [place]   [facility] Access to Care Line  24-hour crisis assistance; [phone] All   [facility]  24-hour crisis response line: [phone] All   Other Local Resources (Updated 04/2015)  Inpatient Behavioral Health/Residential Substance Abuse Treatment Programs   Services      Address and Phone Number  [facility] ([facility])   14-day residential rehabilitation  [phone] 100 [address] [place], [place]  [facility] ([facility])    Detox - private pay only  14-day residential rehabilitation -  Medicaid, insurance, private pay only ([phone], or [phone] [address], [place], [place] [address]   [facility] [employer] only  Multiple facilities [phone] admissions   [facility] ([facility])   90-day program  Must be homeless to participate  [phone], or [phone] Marcy Panning, [facility]  [facility] only [phone], or  [phone] [address] [place], [place] [address]  [facility]     Must make an appointment  Transportation is offered from [place] on [employer].  Accepts private pay, Sheryn Bison [facility] [phone]  [address] [place], [place] [address]   [employer]  Females only  Associated with the [facility] [phone] [address] [place], [place] [address]  [facility] only [phone], or [phone] [address] [place], [place]  [facility]    Detox  Residential rehabilitation  Private insurance only  Multiple locations [phone] admissions  [facility]    Private pay  Private insurance [phone]  [address] [place], [place] [phone]    [facility]    Males only  Fee required at time of admission [phone] [address] [place], [place] [address]  [facility]    Private pay only  [phone] [place], [place]  RTS ([facility]idential Treatment Services)    Detox - private pay, Medicaid  Residential rehabilitation for males  - Medicare, Medicaid, insurance, private pay [phone] [address] [place], [place]   [facility]    Walk-in interviews Monday - Saturday from 8 am - 4 pm  Individuals with legal charges are not eligible [phone] [address] [place], [place] [address]  [facility]   Must be willing to work  Must attend Alcoholics Anonymous meetings [phone] [address] [place], [place]   [facility]    Faith-based program  Private pay only [phone] [address] [place], [place]  Community Resource Guide Outpatient Counseling/Substance Abuse Adult The United Ways 211 is a great source of information about community services available.  Access by dialing 2-1-1 from anywhere in [place], or by website -  PooledIncome.plwww.nc211.org.   Other Local Resources (Updated 04/2015)  Crisis Hotlines   Services     Area Served  [employer] [facility]  Crisis Hotline, available 24 hours a day, 7 days a week: [phone] [facility] [place] [place], [place]   [facility]  Crisis Hotline, available 24 hours a day, 7 days a week: [phone] [facility], [place]  [facility]  Suicide Prevention Hotline, available 24 hours a day, 7 days a week: (775)[phone] [facility] [place], [place]  [employer]   Crisis Hotline, available 24 hours a day, 7 days a week: [phone] [facility], [place]   [facility] Access to [employer]Care Line  Crisis Hotline, available 24 hours a day, 7 days a week: [phone] All   [facility]  Crisis Hotline, available 24 hours a day, 7 days a week: [phone] All   Other Local  Resources (Updated 04/2015)  Outpatient Counseling/ Substance Abuse Programs  Services     Address and Phone Number  ADS ([facility]cohol and Drug Services)   Options include Individual counseling, group counseling, intensive outpatient program (several hours a day, several days a week)  Offers depression assessments  Provides methadone maintenance program [phone] [address], [address] [place], [place] [address]   [facility]   Offers partial hospitalization/day treatment and DUI/DWI programs  [employer]ccepts Medicare, private insurance [phone] [address], [address] [place], [place] [address]  [facility]    Services include intensive outpatient program (several hours a day, several days a week), outpatient treatment, DUI/DWI services, family education  Also has some services specifically for [employer]  Offers transitional housing  [phone] [address] [place], [place] [address]     [facility]  [employer]ccepts Medicare, private pay, and private insurance [phone] [address], [address] [place], [place] [address]  [facility]  Services include individual counseling, substance abuse intensive outpatient program (several hours a day, several days a week), day treatment  Delene Lollccepts Medicare, Medicaid, private insurance [phone] [address], [address] [place], [place] [address]  [facility]   Offers substance abuse intensive outpatient program (several hours a day, several days a week), partial hospitalization program [phone] [address] [place], [place] [address]  [phone] [address] [place], [place] [address]  [phone] [address] [place], [place] [address]  [phone] [address]athie [address], [address] [place], [place] [address]  [facility]  Individual counseling only  Accepts private insurance only [phone] [address] [place], [place] [address]  [facility]  Methadone maintenance program [phone] [address] [place], [place] [address]  [facility]  Walk-In Clinic providing substance abuse and mental health counseling  Accepts Medicaid, Medicare, private insurance  Offers sliding scale for uninsured [phone] [address] [place], [place]   Faith in [place], [employer].  Offers individual counseling, and intensive in-home services [phone] [address] [place], [place] [address]  [facility] individual counseling, family counseling, group therapy, domestic violence counseling, consumer credit counseling  Accepts Medicare, Medicaid, private insurance  Offers sliding scale for uninsured [phone] [address] [place], [place] [address]  [phone] [facility], [address] [place], [place] [address]  [facility]  Offers individual, family and group counseling  3 locations - [place], [place], and [place]  [phone]  [address] [place], [place] [address]  [address] [place], [place] [address]  [address] [place], [place] [address]  [facility] Margo [facility]    Offers psychiatric assessment, 8-week Intensive Outpatient Program (several hours a day, several times a week, daytime or evenings), early recovery group, family Program, medication management  Private pay or private insurance only [phone], or  [phone] [address] [place], [place] [address]  [place] [employer] individual, couples and family counseling  Accepts Medicaid, private insurance, and sliding scale for uninsured [phone] [address] [place], [place] [address]  Len Blalock, MD  Individual counseling  Private insurance [phone] [address] [place], [place] [address]  [facility]   Offers assessment, substance abuse treatment, and  behavioral health treatment [phone] [address] [place], [place] [address]  [facility]  Individual counseling  Accepts private insurance [phone] [address] [place], [place] [address]  [facility]  Individual counseling  Delene Loll, private insurance [phone] [address] [place], [place] [address]  [facility]    Offers intensive outpatient program (several hours a day, several times a week)  Private pay, private insurance [phone] [facility] [place], [place]  [facility]  Individual counseling  Medicare, private insurance [phone] [address] [place], [place] [address]  [facility]    Offers intensive outpatient program (several hours a day, several times a week) and partial hospitalization program [phone] [address] [place], [place] [address]  Emerson Monte, MD  Individual counseling [phone] [address] [place], [place] [address]  [facility]  Offers Christian counseling to individuals, couples, and families  Accepts Medicare and private insurance; offers sliding scale for uninsured [phone] [address] [place], [place] [address]  [facility]  Uncertain counseling [phone] [address] [place], [place] [address]  [facility] crisis counseling, individual counseling, group therapy, in-home therapy, domestic violence services, day treatment, DWI services, Administrator, arts (CST), Assertive Community Treatment Team (ACTT), substance abuse Intensive Outpatient Program (several hours a day, several times a week)  2 locations - [place] and [place] [phone] [address] [place], [place] [address]  [phone] [address] [place], [place] [address]  [facility]      Individual counseling and group therapy  Accepts private insurance, [place], [place] [phone] [address] [place], [place]  [facility]  Offers individual and family counseling  Offers LGBTQ services  Accepts private insurance and private pay [phone] [address] [place], [place] [address]  [facility]    Offers individual counseling, group therapy, and outpatient detox  Accepts private insurance [phone] [address] [place], [place]  [facility]  Individual counseling  Accepts Medicare, private insurance [phone] [address] [place], [place] [address]  [employer]  Individual counseling  Accepts Medicare, private insurance [phone] [address] [place], [place] [address]  Gilman Buttner [facility]   Offers substance abuse Intensive Outpatient Program (several hours a day, several times a week) [phone], or [phone] [place], [place]

## 2015-08-30 NOTE — ED Notes (Signed)
Patient given ice pack for HA.

## 2015-08-30 NOTE — ED Notes (Signed)
Patient presents via EMS for heroin OD. 2mg  Narcan IM. Patient A&O x4, ambulatory to room. C/o HA. 22g right wrist.   Last VS: cbg104, st110, 148/62, 24 resp, 97%ra.

## 2016-02-25 ENCOUNTER — Emergency Department (HOSPITAL_BASED_OUTPATIENT_CLINIC_OR_DEPARTMENT_OTHER)
Admission: EM | Admit: 2016-02-25 | Discharge: 2016-02-25 | Disposition: A | Discharge status: Home / Self Care | Payer: Self-pay | Attending: Emergency Medicine | Admitting: Emergency Medicine

## 2016-02-25 ENCOUNTER — Encounter (HOSPITAL_BASED_OUTPATIENT_CLINIC_OR_DEPARTMENT_OTHER): Payer: Self-pay | Admitting: Emergency Medicine

## 2016-02-25 DIAGNOSIS — I10 Essential (primary) hypertension: Secondary | ICD-10-CM | POA: Insufficient documentation

## 2016-02-25 DIAGNOSIS — J36 Peritonsillar abscess: Secondary | ICD-10-CM | POA: Insufficient documentation

## 2016-02-25 LAB — CBC WITH DIFFERENTIAL/PLATELET
Basophils Absolute: 0 10*3/uL (ref 0.0–0.1)
Basophils Relative: 0 %
EOS ABS: 0.4 10*3/uL (ref 0.0–0.7)
EOS PCT: 2 %
HCT: 45.3 % (ref 39.0–52.0)
Hemoglobin: 15.4 g/dL (ref 13.0–17.0)
LYMPHS ABS: 1.7 10*3/uL (ref 0.7–4.0)
Lymphocytes Relative: 10 %
MCH: 29.4 pg (ref 26.0–34.0)
MCHC: 34 g/dL (ref 30.0–36.0)
MCV: 86.6 fL (ref 78.0–100.0)
MONOS PCT: 11 %
Monocytes Absolute: 1.8 10*3/uL — ABNORMAL HIGH (ref 0.1–1.0)
Neutro Abs: 13.1 10*3/uL — ABNORMAL HIGH (ref 1.7–7.7)
Neutrophils Relative %: 77 %
PLATELETS: 292 10*3/uL (ref 150–400)
RBC: 5.23 MIL/uL (ref 4.22–5.81)
RDW: 15.2 % (ref 11.5–15.5)
WBC: 17.1 10*3/uL — AB (ref 4.0–10.5)

## 2016-02-25 LAB — BASIC METABOLIC PANEL
ANION GAP: 7 (ref 5–15)
BUN: 12 mg/dL (ref 6–20)
CALCIUM: 9.2 mg/dL (ref 8.9–10.3)
CHLORIDE: 105 mmol/L (ref 101–111)
CO2: 24 mmol/L (ref 22–32)
CREATININE: 0.84 mg/dL (ref 0.61–1.24)
GFR calc non Af Amer: >60 mL/min (ref >60)
Glucose, Bld: 117 mg/dL — ABNORMAL HIGH (ref 65–99)
Potassium: 4.2 mmol/L (ref 3.5–5.1)
Sodium: 136 mmol/L (ref 135–145)

## 2016-02-25 MED ORDER — MORPHINE SULFATE (PF) 4 MG/ML IV SOLN
4.0000 mg | Freq: Once | INTRAVENOUS | Status: AC
Start: 2016-02-25 — End: 2016-02-25
  Administered 2016-02-25: 4 mg via INTRAVENOUS
  Filled 2016-02-25: qty 1

## 2016-02-25 MED ORDER — CLINDAMYCIN HCL 150 MG PO CAPS
300.0000 mg | ORAL_CAPSULE | Freq: Once | ORAL | Status: AC
  Administered 2016-02-25: 300 mg via ORAL
  Filled 2016-02-25: qty 2

## 2016-02-25 MED ORDER — DEXAMETHASONE SODIUM PHOSPHATE 10 MG/ML IJ SOLN
20.0000 mg | Freq: Once | INTRAMUSCULAR | Status: AC
  Administered 2016-02-25: 20 mg via INTRAVENOUS
  Filled 2016-02-25: qty 2

## 2016-02-25 MED ORDER — SODIUM CHLORIDE 0.9 % IV BOLUS (SEPSIS)
1000.0000 mL | Freq: Once | INTRAVENOUS | Status: AC
  Administered 2016-02-25: 1000 mL via INTRAVENOUS

## 2016-02-25 MED ORDER — CLINDAMYCIN PHOSPHATE 900 MG/50ML IV SOLN
900.0000 mg | Freq: Once | INTRAVENOUS | Status: AC
  Administered 2016-02-25: 900 mg via INTRAVENOUS
  Filled 2016-02-25: qty 50

## 2016-02-25 MED ORDER — HYDROCODONE-ACETAMINOPHEN 5-325 MG PO TABS: 1.0000 | ORAL_TABLET | Freq: Four times a day (QID) | ORAL | 0 refills | Status: DC | PRN

## 2016-02-25 MED ORDER — CLINDAMYCIN HCL 300 MG PO CAPS: 300.0000 mg | ORAL_CAPSULE | Freq: Four times a day (QID) | ORAL | 0 refills | Status: DC

## 2016-02-25 MED ORDER — PREDNISONE 20 MG PO TABS
20.0000 mg | ORAL_TABLET | Freq: Once | ORAL | Status: AC
  Administered 2016-02-25: 20 mg via ORAL
  Filled 2016-02-25: qty 1

## 2016-02-25 MED ORDER — PREDNISONE 10 MG PO TABS: 20.0000 mg | ORAL_TABLET | Freq: Two times a day (BID) | ORAL | 0 refills | Status: DC

## 2016-02-25 MED FILL — HYDROCODON-APAP 5-325: 5-325 | 2 days supply | Qty: 15 | Fill #0

## 2016-02-25 MED FILL — predniSONE 10 MG TABS: 10 | 5 days supply | Qty: 20 | Fill #0

## 2016-02-25 MED FILL — CLINDAMYCIN HCL 300 MG CAPS: 300 | 7 days supply | Qty: 28 | Fill #0

## 2016-02-25 NOTE — Discharge Instructions (Signed)
Clindamycin and prednisone as prescribed.  Hydrocodone as prescribed as needed for pain.  Follow-up with Dr. Suszanne Connerseoh in the next 2-3 days. Call his office this afternoon to arrange this appointment. His contact information has been provided in this discharge summary.

## 2016-02-25 NOTE — ED Triage Notes (Signed)
Pt c/o sore throat x 1 wk; ibuprofen not helping; c/o RT ear pain as well

## 2016-02-25 NOTE — ED Provider Notes (Signed)
[facility]   CSN: [id number] Arrival date & time: 02/25/16  1059     History   Chief Complaint Chief Complaint  Patient presents with  . Sore Throat    HPI Grant Tapia is a 30 y.o. male.  Patient is a 30 year old male with no significant past medical history. He presents with a one-week history of sore throat that has gradually worsened. The pain is worse on the right and radiates into his ear. He denies any fevers or chills. He does report pain worse with swallowing.   The history is provided by the patient.  Sore Throat  This is a new problem. Episode onset: 1 week ago. The problem occurs constantly. The problem has been gradually worsening. Pertinent negatives include no chest pain. Nothing aggravates the symptoms. Nothing relieves the symptoms. Treatments tried: Ibuprofen. The treatment provided no relief.    Past Medical History:  Diagnosis Date  . Hypertension     Patient Active Problem List   Diagnosis Date Noted  . Lactic acidosis 03/29/2014  . Sepsis (HCC) 03/27/2014  . Chest pain   . Headache   . Neck pain     History reviewed. No pertinent surgical history.     Home Medications    Prior to Admission medications   Not on File    Family History History reviewed. No pertinent family history.  Social History Social History  Substance Use Topics  . Smoking status: Never Smoker  . Smokeless tobacco: Never Used  . Alcohol use Yes     Allergies   Patient has no known allergies.   Review of Systems Review of Systems  Cardiovascular: Negative for chest pain.  All other systems reviewed and are negative.    Physical Exam Updated Vital Signs BP (!) 148/101 (BP Location: Left Arm)   Pulse 113   Temp 99.4 F (37.4 C) (Oral)   Resp 18   Ht 5\' 11"  (1.803 m)   Wt 175 lb 9.6 oz (79.7 kg)   SpO2 100%   BMI 24.49 kg/m   Physical Exam  Constitutional: He is oriented to person, place, and time. He appears  well-developed and well-nourished. No distress.  HENT:  Head: Normocephalic and atraumatic.  There is erythema of the posterior oropharynx. There is deviation of the right tonsillar pillar. There is no stridor or difficulty breathing.  Neck: Normal range of motion. Neck supple.  Cardiovascular: Normal rate and regular rhythm.  Exam reveals no friction rub.   No murmur heard. Pulmonary/Chest: Effort normal and breath sounds normal. No respiratory distress. He has no wheezes. He has no rales.  Abdominal: Soft. Bowel sounds are normal. He exhibits no distension. There is no tenderness.  Musculoskeletal: Normal range of motion. He exhibits no edema.  Neurological: He is alert and oriented to person, place, and time. Coordination normal.  Skin: Skin is warm and dry. He is not diaphoretic.  Nursing note and vitals reviewed.    ED Treatments / Results  Labs (all labs ordered are listed, but only abnormal results are displayed) Labs Reviewed - No data to display  EKG  EKG Interpretation None       Radiology No results found.  Procedures Procedures (including critical care time)  Medications Ordered in ED Medications - No data to display   Initial Impression / Assessment and Plan / ED Course  I have reviewed the triage vital signs and the nursing notes.  Pertinent labs & imaging results that were available during  my care of the patient were reviewed by me and considered in my medical decision making (see chart for details).  Clinical Course     Patient presents with worsening sore throat for the past week. On physical exam, he has a right peritonsillar abscess. He has an elevated white count. I've discussed this with Dr. Suszanne Connerseoh from ENT. He is recommending IV clindamycin and Decadron and follow-up in the office. He will be treated with clindamycin as an outpatient as well as prednisone. He has no stridor or evidence for respiratory compromise.  Final Clinical Impressions(s) / ED  Diagnoses   Final diagnoses:  None    New Prescriptions New Prescriptions   No medications on file     Geoffery Lyonsouglas Nayab Aten, MD 02/25/16 1301

## 2016-07-15 ENCOUNTER — Encounter (HOSPITAL_BASED_OUTPATIENT_CLINIC_OR_DEPARTMENT_OTHER): Payer: Self-pay | Admitting: *Deleted

## 2016-07-15 ENCOUNTER — Inpatient Hospital Stay (HOSPITAL_BASED_OUTPATIENT_CLINIC_OR_DEPARTMENT_OTHER)
Admission: EM | Admit: 2016-07-15 | Discharge: 2016-07-17 | DRG: 071 | Disposition: A | Discharge status: Home / Self Care | Payer: Self-pay | Attending: Family Medicine | Admitting: Family Medicine

## 2016-07-15 DIAGNOSIS — G2581 Restless legs syndrome: Secondary | ICD-10-CM | POA: Diagnosis present

## 2016-07-15 DIAGNOSIS — F17210 Nicotine dependence, cigarettes, uncomplicated: Secondary | ICD-10-CM | POA: Diagnosis present

## 2016-07-15 DIAGNOSIS — F19939 Other psychoactive substance use, unspecified with withdrawal, unspecified: Secondary | ICD-10-CM | POA: Diagnosis present

## 2016-07-15 DIAGNOSIS — F1910 Other psychoactive substance abuse, uncomplicated: Secondary | ICD-10-CM

## 2016-07-15 DIAGNOSIS — R442 Other hallucinations: Secondary | ICD-10-CM | POA: Diagnosis present

## 2016-07-15 DIAGNOSIS — K7682 Hepatic encephalopathy: Secondary | ICD-10-CM

## 2016-07-15 DIAGNOSIS — K7200 Acute and subacute hepatic failure without coma: Secondary | ICD-10-CM

## 2016-07-15 DIAGNOSIS — G9341 Metabolic encephalopathy: Principal | ICD-10-CM

## 2016-07-15 DIAGNOSIS — F1123 Opioid dependence with withdrawal: Secondary | ICD-10-CM

## 2016-07-15 DIAGNOSIS — F1510 Other stimulant abuse, uncomplicated: Secondary | ICD-10-CM

## 2016-07-15 DIAGNOSIS — F1110 Opioid abuse, uncomplicated: Secondary | ICD-10-CM

## 2016-07-15 DIAGNOSIS — F329 Major depressive disorder, single episode, unspecified: Secondary | ICD-10-CM | POA: Diagnosis present

## 2016-07-15 DIAGNOSIS — F19129 Other psychoactive substance abuse with intoxication, unspecified: Secondary | ICD-10-CM | POA: Diagnosis present

## 2016-07-15 DIAGNOSIS — N179 Acute kidney failure, unspecified: Secondary | ICD-10-CM

## 2016-07-15 DIAGNOSIS — R945 Abnormal results of liver function studies: Secondary | ICD-10-CM | POA: Diagnosis present

## 2016-07-15 DIAGNOSIS — F1593 Other stimulant use, unspecified with withdrawal: Secondary | ICD-10-CM | POA: Diagnosis present

## 2016-07-15 DIAGNOSIS — F419 Anxiety disorder, unspecified: Secondary | ICD-10-CM | POA: Diagnosis present

## 2016-07-15 DIAGNOSIS — F10239 Alcohol dependence with withdrawal, unspecified: Secondary | ICD-10-CM

## 2016-07-15 DIAGNOSIS — F1410 Cocaine abuse, uncomplicated: Secondary | ICD-10-CM

## 2016-07-15 DIAGNOSIS — I10 Essential (primary) hypertension: Secondary | ICD-10-CM | POA: Diagnosis present

## 2016-07-15 DIAGNOSIS — E860 Dehydration: Secondary | ICD-10-CM | POA: Diagnosis present

## 2016-07-15 DIAGNOSIS — R000 Tachycardia, unspecified: Secondary | ICD-10-CM

## 2016-07-15 DIAGNOSIS — F1010 Alcohol abuse, uncomplicated: Secondary | ICD-10-CM

## 2016-07-15 DIAGNOSIS — R45851 Suicidal ideations: Secondary | ICD-10-CM

## 2016-07-15 LAB — CBC WITH DIFFERENTIAL/PLATELET
Basophils Absolute: 0 10*3/uL (ref 0.0–0.1)
Basophils Relative: 0 %
EOS ABS: 0.2 10*3/uL (ref 0.0–0.7)
EOS PCT: 2 %
HCT: 45.6 % (ref 39.0–52.0)
HEMOGLOBIN: 16.4 g/dL (ref 13.0–17.0)
LYMPHS ABS: 1.8 10*3/uL (ref 0.7–4.0)
Lymphocytes Relative: 16 %
MCH: 31.4 pg (ref 26.0–34.0)
MCHC: 36 g/dL (ref 30.0–36.0)
MCV: 87.4 fL (ref 78.0–100.0)
MONOS PCT: 12 %
Monocytes Absolute: 1.4 10*3/uL — ABNORMAL HIGH (ref 0.1–1.0)
NEUTROS PCT: 70 %
Neutro Abs: 8.2 10*3/uL — ABNORMAL HIGH (ref 1.7–7.7)
Platelets: 292 10*3/uL (ref 150–400)
RBC: 5.22 MIL/uL (ref 4.22–5.81)
RDW: 13.9 % (ref 11.5–15.5)
WBC: 11.6 10*3/uL — ABNORMAL HIGH (ref 4.0–10.5)

## 2016-07-15 LAB — URINALYSIS, MICROSCOPIC (REFLEX): RBC / HPF: NONE SEEN RBC/hpf (ref 0–5)

## 2016-07-15 LAB — COMPREHENSIVE METABOLIC PANEL
ALBUMIN: 4.6 g/dL (ref 3.5–5.0)
ALK PHOS: 100 U/L (ref 38–126)
ALT: 130 U/L — AB (ref 17–63)
ALT: 104 U/L — ABNORMAL HIGH (ref 17–63)
AST: 102 U/L — AB (ref 15–41)
AST: 78 U/L — ABNORMAL HIGH (ref 15–41)
Albumin: 4.2 g/dL (ref 3.5–5.0)
Alkaline Phosphatase: 77 U/L (ref 38–126)
Anion gap: 14 (ref 5–15)
Anion gap: 6 (ref 5–15)
BUN: 18 mg/dL (ref 6–20)
BUN: 12 mg/dL (ref 6–20)
CALCIUM: 10.4 mg/dL — AB (ref 8.9–10.3)
CO2: 22 mmol/L (ref 22–32)
CO2: 26 mmol/L (ref 22–32)
CREATININE: 2.18 mg/dL — AB (ref 0.61–1.24)
Calcium: 9 mg/dL (ref 8.9–10.3)
Chloride: 101 mmol/L (ref 101–111)
Chloride: 105 mmol/L (ref 101–111)
Creatinine, Ser: 1.01 mg/dL (ref 0.61–1.24)
GFR calc Af Amer: 46 mL/min — ABNORMAL LOW (ref >60)
GFR calc Af Amer: >60 mL/min (ref >60)
GFR calc non Af Amer: 39 mL/min — ABNORMAL LOW (ref >60)
GFR calc non Af Amer: >60 mL/min (ref >60)
GLUCOSE: 137 mg/dL — AB (ref 65–99)
Glucose, Bld: 117 mg/dL — ABNORMAL HIGH (ref 65–99)
Potassium: 3.9 mmol/L (ref 3.5–5.1)
Potassium: 4.4 mmol/L (ref 3.5–5.1)
SODIUM: 137 mmol/L (ref 135–145)
Sodium: 137 mmol/L (ref 135–145)
Total Bilirubin: 1.5 mg/dL — ABNORMAL HIGH (ref 0.3–1.2)
Total Bilirubin: 1.4 mg/dL — ABNORMAL HIGH (ref 0.3–1.2)
Total Protein: 8.5 g/dL — ABNORMAL HIGH (ref 6.5–8.1)
Total Protein: 7.4 g/dL (ref 6.5–8.1)

## 2016-07-15 LAB — CREATININE, SERUM: CREATININE: 1.2 mg/dL (ref 0.61–1.24)

## 2016-07-15 LAB — URINALYSIS, ROUTINE W REFLEX MICROSCOPIC
Glucose, UA: NEGATIVE mg/dL
HGB URINE DIPSTICK: NEGATIVE
Ketones, ur: 15 mg/dL — AB
Nitrite: POSITIVE — AB
PROTEIN: 30 mg/dL — AB
Specific Gravity, Urine: 1.027 (ref 1.005–1.030)
pH: 6 (ref 5.0–8.0)

## 2016-07-15 LAB — RAPID URINE DRUG SCREEN, HOSP PERFORMED
Amphetamines: POSITIVE — AB
Barbiturates: NOT DETECTED
Benzodiazepines: NOT DETECTED
Cocaine: POSITIVE — AB
OPIATES: POSITIVE — AB
TETRAHYDROCANNABINOL: NOT DETECTED

## 2016-07-15 LAB — SODIUM, URINE, RANDOM: Sodium, Ur: 176 mmol/L

## 2016-07-15 LAB — CBC
HEMATOCRIT: 40.9 % (ref 39.0–52.0)
HEMOGLOBIN: 14.1 g/dL (ref 13.0–17.0)
MCH: 29.9 pg (ref 26.0–34.0)
MCHC: 34.5 g/dL (ref 30.0–36.0)
MCV: 86.8 fL (ref 78.0–100.0)
Platelets: 213 10*3/uL (ref 150–400)
RBC: 4.71 MIL/uL (ref 4.22–5.81)
RDW: 13.7 % (ref 11.5–15.5)
WBC: 7.4 10*3/uL (ref 4.0–10.5)

## 2016-07-15 LAB — ACETAMINOPHEN LEVEL: Acetaminophen (Tylenol), Serum: <10 ug/mL — ABNORMAL LOW (ref 10–30)

## 2016-07-15 LAB — CREATININE, URINE, RANDOM: Creatinine, Urine: 99.36 mg/dL

## 2016-07-15 LAB — ETHANOL: Alcohol, Ethyl (B): <5 mg/dL (ref <5)

## 2016-07-15 LAB — SALICYLATE LEVEL

## 2016-07-15 MED ORDER — LORAZEPAM 2 MG/ML IJ SOLN: 1.0000 mg | Freq: Four times a day (QID) | INTRAMUSCULAR | Status: DC | PRN

## 2016-07-15 MED ORDER — LORAZEPAM 1 MG PO TABS
0.0000 mg | ORAL_TABLET | Freq: Four times a day (QID) | ORAL | Status: DC
  Administered 2016-07-15: 2 mg via ORAL
  Administered 2016-07-15: 4 mg via ORAL
  Filled 2016-07-15: qty 1
  Filled 2016-07-15: qty 4
  Filled 2016-07-15: qty 2

## 2016-07-15 MED ORDER — ONDANSETRON HCL 4 MG/2ML IJ SOLN
4.0000 mg | Freq: Once | INTRAMUSCULAR | Status: AC
  Administered 2016-07-15: 4 mg via INTRAVENOUS
  Filled 2016-07-15: qty 2

## 2016-07-15 MED ORDER — ENOXAPARIN SODIUM 40 MG/0.4ML ~~LOC~~ SOLN
40.0000 mg | SUBCUTANEOUS | Status: DC
  Administered 2016-07-15 – 2016-07-16 (×2): 40 mg via SUBCUTANEOUS
  Filled 2016-07-15 (×2): qty 0.4

## 2016-07-15 MED ORDER — LORAZEPAM 2 MG/ML IJ SOLN: 2.0000 mg | Freq: Four times a day (QID) | INTRAMUSCULAR | Status: DC | PRN

## 2016-07-15 MED ORDER — CLONIDINE HCL 0.1 MG PO TABS
0.1000 mg | ORAL_TABLET | Freq: Three times a day (TID) | ORAL | Status: DC
  Administered 2016-07-15 – 2016-07-17 (×6): 0.1 mg via ORAL
  Filled 2016-07-15 (×6): qty 1

## 2016-07-15 MED ORDER — LORAZEPAM 2 MG/ML IJ SOLN
2.0000 mg | Freq: Once | INTRAMUSCULAR | Status: DC
  Filled 2016-07-15: qty 1

## 2016-07-15 MED ORDER — LORAZEPAM 1 MG PO TABS: 2.0000 mg | ORAL_TABLET | Freq: Four times a day (QID) | ORAL | Status: DC | PRN

## 2016-07-15 MED ORDER — DIPHENOXYLATE-ATROPINE 2.5-0.025 MG PO TABS: 1.0000 | ORAL_TABLET | Freq: Four times a day (QID) | ORAL | Status: DC | PRN

## 2016-07-15 MED ORDER — ACETAMINOPHEN 325 MG PO TABS
650.0000 mg | ORAL_TABLET | Freq: Once | ORAL | Status: AC
  Administered 2016-07-15: 650 mg via ORAL
  Filled 2016-07-15: qty 2

## 2016-07-15 MED ORDER — SODIUM CHLORIDE 0.9 % IV BOLUS (SEPSIS)
1000.0000 mL | Freq: Once | INTRAVENOUS | Status: AC
  Administered 2016-07-15: 1000 mL via INTRAVENOUS

## 2016-07-15 MED ORDER — ONDANSETRON HCL 4 MG/2ML IJ SOLN: 4.0000 mg | Freq: Four times a day (QID) | INTRAMUSCULAR | Status: DC | PRN

## 2016-07-15 MED ORDER — LORAZEPAM 1 MG PO TABS: 0.0000 mg | ORAL_TABLET | Freq: Two times a day (BID) | ORAL | Status: DC

## 2016-07-15 MED ORDER — ACETAMINOPHEN 325 MG PO TABS: 650.0000 mg | ORAL_TABLET | Freq: Four times a day (QID) | ORAL | Status: DC | PRN

## 2016-07-15 MED ORDER — DICYCLOMINE HCL 10 MG/ML IM SOLN
20.0000 mg | Freq: Once | INTRAMUSCULAR | Status: DC
  Filled 2016-07-15: qty 2

## 2016-07-15 MED ORDER — VITAMIN B-1 100 MG PO TABS
100.0000 mg | ORAL_TABLET | Freq: Every day | ORAL | Status: DC
  Administered 2016-07-16 – 2016-07-17 (×2): 100 mg via ORAL
  Filled 2016-07-15 (×2): qty 1

## 2016-07-15 MED ORDER — ACETAMINOPHEN 650 MG RE SUPP: 650.0000 mg | Freq: Four times a day (QID) | RECTAL | Status: DC | PRN

## 2016-07-15 MED ORDER — SODIUM CHLORIDE 0.9 % IV SOLN
INTRAVENOUS | Status: DC
  Administered 2016-07-16: 15:00:00 via INTRAVENOUS

## 2016-07-15 MED ORDER — LORAZEPAM 2 MG/ML IJ SOLN
2.0000 mg | Freq: Once | INTRAMUSCULAR | Status: AC
Start: 2016-07-15 — End: 2016-07-16
  Administered 2016-07-16: 2 mg via INTRAVENOUS
  Filled 2016-07-15: qty 1

## 2016-07-15 MED ORDER — SODIUM CHLORIDE 0.9 % IV SOLN
INTRAVENOUS | Status: DC
  Administered 2016-07-15 – 2016-07-17 (×5): via INTRAVENOUS

## 2016-07-15 MED ORDER — FOLIC ACID 1 MG PO TABS
1.0000 mg | ORAL_TABLET | Freq: Every day | ORAL | Status: DC
  Administered 2016-07-15 – 2016-07-17 (×3): 1 mg via ORAL
  Filled 2016-07-15 (×3): qty 1

## 2016-07-15 MED ORDER — LORAZEPAM 1 MG PO TABS
2.0000 mg | ORAL_TABLET | ORAL | Status: DC | PRN
  Administered 2016-07-15: 2 mg via ORAL
  Filled 2016-07-15: qty 2

## 2016-07-15 MED ORDER — LORAZEPAM 1 MG PO TABS: 1.0000 mg | ORAL_TABLET | Freq: Four times a day (QID) | ORAL | Status: DC | PRN

## 2016-07-15 MED ORDER — ONDANSETRON HCL 4 MG PO TABS
4.0000 mg | ORAL_TABLET | Freq: Four times a day (QID) | ORAL | Status: DC | PRN
Start: 2016-07-15 — End: 2016-07-17

## 2016-07-15 MED ORDER — THIAMINE HCL 100 MG/ML IJ SOLN
100.0000 mg | Freq: Every day | INTRAMUSCULAR | Status: DC
  Administered 2016-07-15: 100 mg via INTRAVENOUS
  Filled 2016-07-15: qty 2

## 2016-07-15 MED ORDER — ADULT MULTIVITAMIN W/MINERALS CH
1.0000 | ORAL_TABLET | Freq: Every day | ORAL | Status: DC
  Administered 2016-07-15 – 2016-07-17 (×3): 1 via ORAL
  Filled 2016-07-15 (×3): qty 1

## 2016-07-15 MED ORDER — LORAZEPAM 2 MG/ML IJ SOLN
1.0000 mg | Freq: Once | INTRAMUSCULAR | Status: AC
  Administered 2016-07-15: 1 mg via INTRAVENOUS
  Filled 2016-07-15: qty 1

## 2016-07-15 MED ORDER — LORAZEPAM 2 MG/ML IJ SOLN
2.0000 mg | INTRAMUSCULAR | Status: DC | PRN
  Administered 2016-07-15 – 2016-07-16 (×2): 4 mg via INTRAVENOUS
  Filled 2016-07-15 (×2): qty 2

## 2016-07-15 NOTE — ED Notes (Signed)
Pt placed on O2 2lpm via nasal canula due to o2 sats 85% following ativan. RT made aware.

## 2016-07-15 NOTE — ED Notes (Signed)
[facility] notified Delice Bison) for hospitalist consult @ [facility]

## 2016-07-15 NOTE — ED Triage Notes (Signed)
Pt here for detox

## 2016-07-15 NOTE — ED Provider Notes (Addendum)
TIME SEEN: 4:18 AM  CHIEF COMPLAINT: Suicidal ideation, heroin abuse  HPI: Patient is a 31 year old male with history of alcohol and heroin abuse, hypertension who presents to the emergency department with a mobile crisis for concerns for expressing suicidal thoughts. Patient denies suicidal thoughts currently and denies that he ever stated suicidality. He states he is here because he wants detox from heroin and he is "dope sick". States he has been injecting here when for the past 4 years and also drinks alcohol every day. Last drank alcohol around 6:30 PM and had injected himself with heroin around 7 PM. He states he is having restless legs, anxiety, nausea and vomiting, abdominal cramping from withdrawal. Denies ever having a seizure when he stops drinking. Denies any other drug or alcohol use. Denies history of suicide attempt. Denies SI, HI or hallucinations currently.  I spoke to patient's girlfriend [place] who lives with patient. She states that patient was screaming, agitated for approximately 3 hours. Neighbors called police because of this behavior. She states the neighbors told her they were scared that he may hurt someone. She states that he said repeatedly that he was "tired of living" and that he "wanted to die". She states that he did not threaten her or her children.   Patient is tachycardic. He denies chest pain, shortness of breath. No fever. States he has been vomiting for the past 2 days. No diarrhea.   Patrice Paradise - [phone]  ROS: See HPI Constitutional: no fever  Eyes: no drainage  ENT: no runny nose   Cardiovascular:  no chest pain  Resp: no SOB  GI: vomiting GU: no dysuria Integumentary: no rash  Allergy: no hives  Musculoskeletal: no leg swelling  Neurological: no slurred speech ROS otherwise negative  PAST MEDICAL HISTORY/PAST SURGICAL HISTORY:  Past Medical History:  Diagnosis Date  . Hypertension     MEDICATIONS:  Prior to Admission medications    Not on File    ALLERGIES:  No Known Allergies  SOCIAL HISTORY:  Social History  Substance Use Topics  . Smoking status: Never Smoker  . Smokeless tobacco: Never Used  . Alcohol use Yes    FAMILY HISTORY: History reviewed. No pertinent family history.  EXAM: BP 131/71 (BP Location: Right Arm)   Pulse (!) 140 Comment: RN notified  Temp 98.3 F (36.8 C) (Oral)   Resp 20   Ht  (1.803 m)   Wt 175 lb (79.4 kg)   SpO2 100%   BMI 24.41 kg/m  CONSTITUTIONAL: Alert and oriented and responds appropriately to questions. Chronically ill-appearing, afebrile, nontoxic HEAD: Normocephalic EYES: Conjunctivae clear, pupils appear equal, EOMI ENT: normal nose; moist mucous membranes NECK: Supple, no meningismus, no nuchal rigidity, no LAD  CARD: Regular and tachycardic; S1 and S2 appreciated; no murmurs, no clicks, no rubs, no gallops RESP: Normal chest excursion without splinting or tachypnea; breath sounds clear and equal bilaterally; no wheezes, no rhonchi, no rales, no hypoxia or respiratory distress, speaking full sentences ABD/GI: Normal bowel sounds; non-distended; soft, non-tender, no rebound, no guarding, no peritoneal signs, no hepatosplenomegaly BACK:  The back appears normal and is non-tender to palpation, there is no CVA tenderness EXT: Normal ROM in all joints; non-tender to palpation; no edema; normal capillary refill; no cyanosis, no calf tenderness or swelling    SKIN: Normal color for age and race; warm; no rash, track marks noted to both arms but there is no sign of superimposed infection. No sign of cellulitis or abscess on  exam. NEURO: Moves all extremities equally, normal gait, normal speech PSYCH: Patient appears very restless, manic. He has rapid loud speech. Seems to have poor insight. At this time denies SI, HI or hallucinations.  MEDICAL DECISION MAKING: Patient here for possible passive suicidality reported by patient's girlfriend. Brought in by mobile  crisis. He currently denies SI, HI or hallucinations. States he is here for detox from heroin and alcohol. At this time and he appears manic and is tachycardic. Afebrile without significant complaints other than states he feels "dope sick". States he thinks he is going through heroin withdrawal. We'll give patient IV fluids, Zofran, Bentyl. We'll obtain screening labs, urine, EKG. Given concerns for passive suicidality by patient's girlfriend Colin Mulders, will contact TTS for further evaluation. At this time he agrees to stay voluntarily.  ED PROGRESS: 5:20 AM  Pt's labs show mild leukocytosis with left shift but again no sign of infection. He does have acute renal cell with a creatinine of 2.18. LFTs also elevated which is likely from alcohol abuse. No significant abdominal tenderness on exam. Urine pending. He has received 1 L of IV fluids and is still tachycardic in the 130s. We'll give second liter. I feel he will need admission for his acute renal failure for medical clearance and then had a psychiatric evaluation for his passive suicidality and substance abuse. He agrees on this plan at this time. He is here voluntarily. He does not have a primary care provider. He does not take any medications for his hypertension. Will discuss with hospitalist at [facility].   5:45 AM Discussed patient's case with hospitalist, Dr. Toniann Fail.  I have recommended admission and patient (and family if present) agree with this plan. Admitting physician will place admission orders.   I reviewed all nursing notes, vitals, pertinent previous records, EKGs, lab and urine results, imaging (as available).   6:58 AM Pt still mildly tachycardic in the low 110. His urine shows positive nitrites, trace leukocytes and many bacteria. He denies flank pain, fevers, dysuria, hematuria, urinary frequency or urgency, penile discharge, testicular pain or swelling. We'll add-on a urine culture. We'll also add on a urine gonorrhea  and chlamydia. At this time I do not feel he needs treatment if he is not symptomatic. Discussed this with patient. He agrees. He is still comfortable with plan to transfer to [facility]. He reports that he is feeling "much better" from his "dope sickness" after Zofran, fluids, Bentyl.   EKG Interpretation  Date/Time:  Wednesday July 15 2016 04:31:15 EDT Ventricular Rate:  126 PR Interval:  124 QRS Duration: 94 QT Interval:  306 QTC Calculation: 443 R Axis:   33 Text Interpretation:  Sinus tachycardia Possible Inferior infarct , age undetermined Abnormal ECG No significant change since last tracing Confirmed by WARD,  DO, KRISTEN [phone]) on 07/15/2016 4:34:51 AM         Layla Maw Ward, DO 07/15/16 0545    Layla Maw Ward, DO 07/15/16 1914

## 2016-07-15 NOTE — H&P (Signed)
History and Physical    Grant Tapia DXY:[id number] DOB: 08-20-1985 DOA: 07/15/2016    PCP: No PCP Per Patient  Patient coming from: home  Chief Complaint: sent for agitation  HPI: Grant Tapia is a 31 y.o. male with medical history significant of alcohol, heroin, methamphetamine, nicotine abuse who was sent to the ER by his girlfriend. He was admitted from [facility] to [facility] hospital as a direct admit.He knows he is at at hospital but cannot tell me exactly where and cannot tell me why he is here. He is confused and is not able to give me a history.   I have spoken with his girlfriend whe states that he lives with her but she was working out of town and had no seen him in a few days. She returned yesterday and found him confused, agitated, yelling and screaming. He states that he was given a drug from someone which he assumed was Heroin.  Per ER notes, neighbors called the police because of his behavior. He admitted to wanting to die as well.  She states he drinks mixed alcoholic drinks all day from waking to sleeping and has been doing so for many years. He uses Heroin every day but as he does not do it around her, she cannot say how much he uses. He uses Methamphetamines as well. She is not aware of any other drug use but his UDS is + for Cocaine.   His last drink was last night around 6.30 PM  Last year he went to a drug rehab program and was prescribed something for depression but has not been taking it.   ED Course: Direct admit: Cr 2.18, vitals show tachycardia and HTN. Last CIWA score was 21  Review of Systems:  All other systems reviewed and apart from HPI, are negative.  Past Medical History:  Diagnosis Date  . Hypertension     History reviewed. No pertinent surgical history.  Social History:  - tells me he does not drink and does not use drugs - girlfriend tells me he drinks daily all day, uses Heroin, Methamphetamines and smokes but she can't tell me how much.  - He does not  work.   No Known Allergies  History reviewed. Per girlfriend, h/o DM and HTN in family    Prior to Admission medications   Not on File    Physical Exam: Vitals:   07/15/16 0845 07/15/16 0949 07/15/16 1003 07/15/16 1055  BP:  (!) 151/83  (!) 158/95  Pulse: 100 (!) 104 96 100  Resp: 16 15 (!) 22 18  Temp:    100 F (37.8 C)  TempSrc:    Oral  SpO2: 100% 100% 97% 100%  Weight:      Height:          Constitutional: NAD, calm, comfortable- Eyes: PERTLA, lids and conjunctivae normal ENMT: Mucous membranes are moist. Posterior pharynx clear of any exudate or lesions. Normal dentition.  Neck: normal, supple, no masses, no thyromegaly Respiratory: clear to auscultation bilaterally, no wheezing, no crackles. Normal respiratory effort. No accessory muscle use.  Cardiovascular: S1 & S2 heard, regular rate and rhythm, no murmurs / rubs / gallops. No extremity edema. 2+ pedal pulses. No carotid bruits.  Abdomen: No distension, no tenderness, no masses palpated. No hepatosplenomegaly. Bowel sounds normal.  Musculoskeletal: no clubbing / cyanosis. No joint deformity upper and lower extremities. Good ROM, no contractures. Normal muscle tone.  Skin: track marks on arms Neurologic: CN 2-12 grossly intact. Sensation intact,  DTR normal. Strength 5/5 in all 4 limbs.  Psychiatric:  poor judgment and insight. Alert and oriented x 3. Normal mood.     Labs on Admission: I have personally reviewed following labs and imaging studies  CBC:  Recent Labs Lab 07/15/16 0428  WBC 11.6*  NEUTROABS 8.2*  HGB 16.4  HCT 45.6  MCV 87.4  PLT 956   Basic Metabolic Panel:  Recent Labs Lab 07/15/16 0428  NA 137  K 3.9  CL 101  CO2 22  GLUCOSE 137*  BUN 18  CREATININE 2.18*  CALCIUM 10.4*   GFR: Estimated Creatinine Clearance: 53.7 mL/min (A) (by C-G formula based on SCr of 2.18 mg/dL (H)). Liver Function Tests:  Recent Labs Lab 07/15/16 0428  AST 102*  ALT 130*  ALKPHOS 100    BILITOT 1.5*  PROT 8.5*  ALBUMIN 4.6   No results for input(s): LIPASE, AMYLASE in the last 168 hours. No results for input(s): AMMONIA in the last 168 hours. Coagulation Profile: No results for input(s): INR, PROTIME in the last 168 hours. Cardiac Enzymes: No results for input(s): CKTOTAL, CKMB, CKMBINDEX, TROPONINI in the last 168 hours. BNP (last 3 results) No results for input(s): PROBNP in the last 8760 hours. HbA1C: No results for input(s): HGBA1C in the last 72 hours. CBG: No results for input(s): GLUCAP in the last 168 hours. Lipid Profile: No results for input(s): CHOL, HDL, LDLCALC, TRIG, CHOLHDL, LDLDIRECT in the last 72 hours. Thyroid Function Tests: No results for input(s): TSH, T4TOTAL, FREET4, T3FREE, THYROIDAB in the last 72 hours. Anemia Panel: No results for input(s): VITAMINB12, FOLATE, FERRITIN, TIBC, IRON, RETICCTPCT in the last 72 hours. Urine analysis:    Component Value Date/Time   COLORURINE ORANGE (A) 07/15/2016 0630   APPEARANCEUR CLOUDY (A) 07/15/2016 0630   LABSPEC 1.027 07/15/2016 0630   PHURINE 6.0 07/15/2016 0630   GLUCOSEU NEGATIVE 07/15/2016 0630   HGBUR NEGATIVE 07/15/2016 0630   BILIRUBINUR SMALL (A) 07/15/2016 0630   KETONESUR 15 (A) 07/15/2016 0630   PROTEINUR 30 (A) 07/15/2016 0630   UROBILINOGEN 0.2 03/26/2014 2255   NITRITE POSITIVE (A) 07/15/2016 0630   LEUKOCYTESUR TRACE (A) 07/15/2016 0630   Sepsis Labs: '@LABRCNTIP'$ (procalcitonin:4,lacticidven:4) )No results found for this or any previous visit (from the past 240 hour(s)).   Radiological Exams on Admission: No results found.  EKG: Independently reviewed. Sinus tachycardia, mild ST depressions in inf leads  Assessment/Plan Principal Problem:   Acute metabolic encephalopathy- drug withdrawl - encephalopathy occurring after ingesting an unknown drug which patient thought was heroin - he denies drug use but UDS + for Cocaine, Opiates and Metamphetamines - he has predominant  symptoms of Metamphetamine and Alcohol withdrawal with confusion, psychosis, agitation, HTN, tachycardia, low grade fever - as far as Heroin withdrawal, was diaphoretic earlier per CIWA documentation no vomiting or diarrhea yet - pupils not dilated - Start Clonidine due to HTN and tachycardia- avoid B blockers - Ativan 2-4 mg as needed Q 4 hrs based on CIWA- large doses can be needed for metamphetamine withdrawal - can give Haldol if Ativan is ineffective- will not order yet - PRN Zofran and PRN Lomotil - Poison control notified and will follow   Active Problems:  Fever- temp going up to 100 - per UpToDate, do not give antipyretics to fever related to Metamphetamine withdrawal - treated with benzodiazepine and cooling blankets  - blood cultures due to IV drug abuse  Elevated LFTS - due to ETOH, drugs -  Poison control has recommended  to start Mucomyst  -Polysubstance abuse   Alcohol abuse   Heroin abuse   Methamphetamine abuse   Cocaine abuse  Suicidal intentions - suicide precautions with sitter- psych consult when more alert   + UA - agree with GC/ Chlamydia probe    AKI (acute kidney injury)  - possibly ATN- check U sodium, U Cr  - baseline Cr. 0.8-0.9 - obtain CK level as this can be elevated with Met am withdrawl   DVT prophylaxis: Lovenox  Code Status: Full code  Family Communication: Josephina Shih, SO-  Disposition Plan: telemetry  Consults called: none  Admission status: inpatient    [facility] MD [facility] Pager: [email or web address] Password [email or web address] 7PM-7AM, please contact night-coverage   07/15/2016, 2:06 PM

## 2016-07-15 NOTE — Progress Notes (Signed)
Spoke w/ DR Butler Denmark r/t VS and no new orders at this time

## 2016-07-16 DIAGNOSIS — F1110 Opioid abuse, uncomplicated: Secondary | ICD-10-CM

## 2016-07-16 DIAGNOSIS — F19939 Other psychoactive substance use, unspecified with withdrawal, unspecified: Secondary | ICD-10-CM | POA: Diagnosis present

## 2016-07-16 DIAGNOSIS — Z79899 Other long term (current) drug therapy: Secondary | ICD-10-CM

## 2016-07-16 DIAGNOSIS — F1010 Alcohol abuse, uncomplicated: Secondary | ICD-10-CM

## 2016-07-16 DIAGNOSIS — F1410 Cocaine abuse, uncomplicated: Secondary | ICD-10-CM

## 2016-07-16 DIAGNOSIS — G9341 Metabolic encephalopathy: Principal | ICD-10-CM

## 2016-07-16 DIAGNOSIS — N179 Acute kidney failure, unspecified: Secondary | ICD-10-CM

## 2016-07-16 DIAGNOSIS — F331 Major depressive disorder, recurrent, moderate: Secondary | ICD-10-CM

## 2016-07-16 DIAGNOSIS — F1510 Other stimulant abuse, uncomplicated: Secondary | ICD-10-CM

## 2016-07-16 LAB — URINE CULTURE: Culture: NO GROWTH

## 2016-07-16 LAB — HIV ANTIBODY (ROUTINE TESTING W REFLEX): HIV Screen 4th Generation wRfx: NONREACTIVE

## 2016-07-16 MED ORDER — LORAZEPAM 1 MG PO TABS: 2.0000 mg | ORAL_TABLET | ORAL | Status: DC | PRN

## 2016-07-16 MED ORDER — CHLORDIAZEPOXIDE HCL 25 MG PO CAPS
50.0000 mg | ORAL_CAPSULE | Freq: Four times a day (QID) | ORAL | Status: AC
  Administered 2016-07-16 (×4): 50 mg via ORAL
  Filled 2016-07-16 (×4): qty 2

## 2016-07-16 MED ORDER — LORAZEPAM 2 MG/ML IJ SOLN
2.0000 mg | INTRAMUSCULAR | Status: DC | PRN
  Administered 2016-07-16 – 2016-07-17 (×3): 2 mg via INTRAVENOUS
  Filled 2016-07-16 (×2): qty 1

## 2016-07-16 MED ORDER — CHLORDIAZEPOXIDE HCL 25 MG PO CAPS
25.0000 mg | ORAL_CAPSULE | Freq: Four times a day (QID) | ORAL | Status: DC
  Administered 2016-07-17 (×3): 25 mg via ORAL
  Filled 2016-07-16 (×4): qty 1

## 2016-07-16 NOTE — Consult Note (Signed)
[facility] Psychiatry Consult   Reason for Consult:  Polysubstance intoxication and agitation Referring Physician:  Dr. Quincy Simmonds Patient Identification: Grant Tapia MRN:  [id number] Principal Diagnosis: Acute metabolic encephalopathy Diagnosis:   Patient Active Problem List   Diagnosis Date Noted  . AKI (acute kidney injury) ([place]) [N17.9] 07/15/2016  . Alcohol abuse [F10.10] 07/15/2016  . Heroin abuse [F11.10] 07/15/2016  . 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 Cocaine abuse [F14.10] 07/15/2016  . Acute metabolic encephalopathy [I34.74] 07/15/2016  . Lactic acidosis [E87.2] 03/29/2014  . Sepsis ([place]) [A41.9] 03/27/2014  . Chest pain [R07.9]   . Headache [R51]   . Neck pain [M54.2]     Total Time spent with patient: 1 hour  Subjective:   Grant Tapia is a 31 y.o. male patient admitted with AMS.  HPI:  Grant Tapia is a 31 y.o. male with medical history significant of alcohol, heroin, methamphetamine, nicotine abuse who was sent to the ER by his girlfriend. He was admitted from [facility] to [place] as a direct admit. Patient seen along with LCSW for this face-to-face psychiatric consultation and evaluation of polysubstance intoxication and agitation. Patient presented to the hospital with altered mental status and his urine drug screen is positive for cocaine, opiates and amphetamines. Patient blood alcohol level is not significant on admission. Patient was not able to participate and has been refusing to talk to this provider. Patient also seems to be in and out of cognitions secondary to multiple drug intoxication and has no significant withdrawal symptoms of opiates or alcohol. Reportedly patient was found with the increased confusion, agitation, yelling and screaming and required more attention from the staff and currently placed with the safety sitter. Patient was not able to endorse current suicidal ideation, homicidal ideation, psychotic symptoms. She could not tell as he  needed detox or rehabilitation treatment at this time because of altered mental status.  Collateral information from his girlfriend, states that he lives with her but she was working out of town and had no seen him in a few days. She returned yesterday and found him confused, agitated, yelling and screaming. He states that he was given a drug from someone which he assumed was Heroin.  Per ER notes, neighbors called the police because of his behavior. He admitted to wanting to die as well. She states he drinks mixed alcoholic drinks all day from waking to sleeping and has been doing so for many years. He uses Heroin every day but as he does not do it around her, she cannot say how much he uses. He uses Methamphetamines as well. She is not aware of any other drug use but his UDS is + for Cocaine, opioids and amphetamines. His last drink was last night around 6.30 PM. Last year he went to a drug rehab program and was prescribed something for depression but has not been taking it.   Past Psychiatric History: Polysubstance abuse vs dependence  Risk to Self: Is patient at risk for suicide?: No Risk to Others:   Prior Inpatient Therapy:   Prior Outpatient Therapy:    Past Medical History:  Past Medical History:  Diagnosis Date  . Hypertension    History reviewed. No pertinent surgical history. Family History: History reviewed. No pertinent family history. Family Psychiatric  History: Unable to obtain during this evaluation Social History:  History  Alcohol Use  . Yes     History  Drug Use  . Types: IV    Comment: heroin  Social History   Social History  . Marital status: Single    Spouse name: N/A  . Number of children: N/A  . Years of education: N/A   Social History Main Topics  . Smoking status: Never Smoker  . Smokeless tobacco: Never Used  . Alcohol use Yes  . Drug use: Yes    Types: IV     Comment: heroin  . Sexual activity: Yes   Other Topics Concern  . None   Social  History Narrative  . None   Additional Social History:    Allergies:  No Known Allergies  Labs:  Results for orders placed or performed during the hospital encounter of 07/15/16 (from the past 48 hour(s))  Comprehensive metabolic panel     Status: Abnormal   Collection Time: 07/15/16  4:28 AM  Result Value Ref Range   Sodium 137 135 - 145 mmol/L   Potassium 3.9 3.5 - 5.1 mmol/L   Chloride 101 101 - 111 mmol/L   CO2 22 22 - 32 mmol/L   Glucose, Bld 137 (H) 65 - 99 mg/dL   BUN 18 6 - 20 mg/dL   Creatinine, Ser 2.18 (H) 0.61 - 1.24 mg/dL   Calcium 10.4 (H) 8.9 - 10.3 mg/dL   Total Protein 8.5 (H) 6.5 - 8.1 g/dL   Albumin 4.6 3.5 - 5.0 g/dL   AST 102 (H) 15 - 41 U/L   ALT 130 (H) 17 - 63 U/L   Alkaline Phosphatase 100 38 - 126 U/L   Total Bilirubin 1.5 (H) 0.3 - 1.2 mg/dL   GFR calc non Af Amer 39 (L) >60 mL/min   GFR calc Af Amer 46 (L) >60 mL/min    Comment: (NOTE) The eGFR has been calculated using the CKD EPI equation. This calculation has not been validated in all clinical situations. eGFR's persistently <60 mL/min signify possible Chronic Kidney Disease.    Anion gap 14 5 - 15  Ethanol     Status: None   Collection Time: 07/15/16  4:28 AM  Result Value Ref Range   Alcohol, Ethyl (B) <5 <5 mg/dL    Comment:        LOWEST DETECTABLE LIMIT FOR SERUM ALCOHOL IS 5 mg/dL FOR MEDICAL PURPOSES ONLY   CBC with Diff     Status: Abnormal   Collection Time: 07/15/16  4:28 AM  Result Value Ref Range   WBC 11.6 (H) 4.0 - 10.5 K/uL   RBC 5.22 4.22 - 5.81 MIL/uL   Hemoglobin 16.4 13.0 - 17.0 g/dL   HCT 45.6 39.0 - 52.0 %   MCV 87.4 78.0 - 100.0 fL   MCH 31.4 26.0 - 34.0 pg   MCHC 36.0 30.0 - 36.0 g/dL   RDW 13.9 11.5 - 15.5 %   Platelets 292 150 - 400 K/uL   Neutrophils Relative % 70 %   Neutro Abs 8.2 (H) 1.7 - 7.7 K/uL   Lymphocytes Relative 16 %   Lymphs Abs 1.8 0.7 - 4.0 K/uL   Monocytes Relative 12 %   Monocytes Absolute 1.4 (H) 0.1 - 1.0 K/uL   Eosinophils  Relative 2 %   Eosinophils Absolute 0.2 0.0 - 0.7 K/uL   Basophils Relative 0 %   Basophils Absolute 0.0 0.0 - 0.1 K/uL  Acetaminophen level     Status: Abnormal   Collection Time: 07/15/16  4:28 AM  Result Value Ref Range   Acetaminophen (Tylenol), Serum <10 (L) 10 - 30 ug/mL    Comment:  THERAPEUTIC CONCENTRATIONS VARY SIGNIFICANTLY. A RANGE OF 10-30 ug/mL MAY BE AN EFFECTIVE CONCENTRATION FOR MANY PATIENTS. HOWEVER, SOME ARE BEST TREATED AT CONCENTRATIONS OUTSIDE THIS RANGE. ACETAMINOPHEN CONCENTRATIONS >150 ug/mL AT 4 HOURS AFTER INGESTION AND >50 ug/mL AT 12 HOURS AFTER INGESTION ARE OFTEN ASSOCIATED WITH TOXIC REACTIONS.   Salicylate level     Status: None   Collection Time: 07/15/16  4:28 AM  Result Value Ref Range   Salicylate Lvl <7.0 2.8 - 30.0 mg/dL  Urine rapid drug screen (hosp performed)not at [facility]     Status: Abnormal   Collection Time: 07/15/16  6:30 AM  Result Value Ref Range   Opiates POSITIVE (A) NONE DETECTED   Cocaine POSITIVE (A) NONE DETECTED   Benzodiazepines NONE DETECTED NONE DETECTED   Amphetamines POSITIVE (A) NONE DETECTED   Tetrahydrocannabinol NONE DETECTED NONE DETECTED   Barbiturates NONE DETECTED NONE DETECTED    Comment:        DRUG SCREEN FOR MEDICAL PURPOSES ONLY.  IF CONFIRMATION IS NEEDED FOR ANY PURPOSE, NOTIFY LAB WITHIN 5 DAYS.        LOWEST DETECTABLE LIMITS FOR URINE DRUG SCREEN Drug Class       Cutoff (ng/mL) Amphetamine      1000 Barbiturate      200 Benzodiazepine   200 Tricyclics       300 Opiates          300 Cocaine          300 THC              50   Urinalysis, Routine w reflex microscopic     Status: Abnormal   Collection Time: 07/15/16  6:30 AM  Result Value Ref Range   Color, Urine ORANGE (A) YELLOW    Comment: BIOCHEMICALS MAY BE AFFECTED BY COLOR   APPearance CLOUDY (A) CLEAR   Specific Gravity, Urine 1.027 1.005 - 1.030   pH 6.0 5.0 - 8.0   Glucose, UA NEGATIVE NEGATIVE mg/dL   Hgb urine  dipstick NEGATIVE NEGATIVE   Bilirubin Urine SMALL (A) NEGATIVE   Ketones, ur 15 (A) NEGATIVE mg/dL   Protein, ur 30 (A) NEGATIVE mg/dL   Nitrite POSITIVE (A) NEGATIVE   Leukocytes, UA TRACE (A) NEGATIVE  Urinalysis, Microscopic (reflex)     Status: Abnormal   Collection Time: 07/15/16  6:30 AM  Result Value Ref Range   RBC / HPF NONE SEEN 0 - 5 RBC/hpf   WBC, UA 0-5 0 - 5 WBC/hpf   Bacteria, UA MANY (A) NONE SEEN   Squamous Epithelial / LPF 0-5 (A) NONE SEEN   Hyaline Casts, UA PRESENT    Granular Casts, UA PRESENT   Urine culture     Status: None   Collection Time: 07/15/16  6:35 AM  Result Value Ref Range   Specimen Description URINE, CLEAN CATCH    Special Requests NONE    Culture      NO GROWTH Performed at [facility], [address], [place], [place] [address]    Report Status 07/16/2016 FINAL   HIV antibody (Routine Testing)     Status: None   Collection Time: 07/15/16  2:13 PM  Result Value Ref Range   HIV Screen 4th Generation wRfx Non Reactive Non Reactive    Comment: (NOTE) Performed At: [facility] [address] [place], [place] [id number] Mila Homer MD QP:[id number]   CBC     Status: None   Collection Time: 07/15/16  2:13 PM  Result Value Ref Range   WBC 7.4 4.0 - 10.5 K/uL   RBC 4.71 4.22 - 5.81 MIL/uL   Hemoglobin 14.1 13.0 - 17.0 g/dL   HCT 40.9 39.0 - 52.0 %   MCV 86.8 78.0 - 100.0 fL   MCH 29.9 26.0 - 34.0 pg   MCHC 34.5 30.0 - 36.0 g/dL   RDW 13.7 11.5 - 15.5 %   Platelets 213 150 - 400 K/uL  Creatinine, serum     Status: None   Collection Time: 07/15/16  2:13 PM  Result Value Ref Range   Creatinine, Ser 1.20 0.61 - 1.24 mg/dL   GFR calc non Af Amer >60 >60 mL/min   GFR calc Af Amer >60 >60 mL/min    Comment: (NOTE) The eGFR has been calculated using the CKD EPI equation. This calculation has not been validated in all clinical situations. eGFR's persistently <60 mL/min signify possible Chronic Kidney Disease.    Sodium, urine, random     Status: None   Collection Time: 07/15/16  2:22 PM  Result Value Ref Range   Sodium, Ur 176 mmol/L    Comment: Performed at [facility] [address], [place], [place] [address]  Creatinine, urine, random     Status: None   Collection Time: 07/15/16  2:22 PM  Result Value Ref Range   Creatinine, Urine 99.36 mg/dL    Comment: Performed at [facility] [address], [place], [place] [address]  Comprehensive metabolic panel     Status: Abnormal   Collection Time: 07/15/16 10:54 PM  Result Value Ref Range   Sodium 137 135 - 145 mmol/L   Potassium 4.4 3.5 - 5.1 mmol/L   Chloride 105 101 - 111 mmol/L   CO2 26 22 - 32 mmol/L   Glucose, Bld 117 (H) 65 - 99 mg/dL   BUN 12 6 - 20 mg/dL   Creatinine, Ser 1.01 0.61 - 1.24 mg/dL   Calcium 9.0 8.9 - 10.3 mg/dL   Total Protein 7.4 6.5 - 8.1 g/dL   Albumin 4.2 3.5 - 5.0 g/dL   AST 78 (H) 15 - 41 U/L   ALT 104 (H) 17 - 63 U/L   Alkaline Phosphatase 77 38 - 126 U/L   Total Bilirubin 1.4 (H) 0.3 - 1.2 mg/dL   GFR calc non Af Amer >60 >60 mL/min   GFR calc Af Amer >60 >60 mL/min    Comment: (NOTE) The eGFR has been calculated using the CKD EPI equation. This calculation has not been validated in all clinical situations. eGFR's persistently <60 mL/min signify possible Chronic Kidney Disease.    Anion gap 6 5 - 15    Current Facility-Administered Medications  Medication Dose Route Frequency Provider Last Rate Last Dose  . 0.9 %  sodium chloride infusion   Intravenous Continuous Kristen N Ward, DO 125 mL/hr at 07/16/16 0553    . 0.9 %  sodium chloride infusion   Intravenous Continuous Saima Rizwan, MD      . chlordiazePOXIDE (LIBRIUM) capsule 50 mg  50 mg Oral QID Grant Lew, MD   50 mg at 07/16/16 1050   Followed by  . [START ON 07/17/2016] chlordiazePOXIDE (LIBRIUM) capsule 25 mg  25 mg Oral QID Grant Lew, MD      . cloNIDine (CATAPRES) tablet 0.1 mg  0.1 mg Oral TID Debbe Odea, MD    0.1 mg at 07/16/16 1050  . dicyclomine (BENTYL) injection 20 mg  20 mg  Intramuscular Once Kristen N Ward, DO      . diphenoxylate-atropine (LOMOTIL) 2.5-0.025 MG per tablet 1 tablet  1 tablet Oral QID PRN Debbe Odea, MD      . enoxaparin (LOVENOX) injection 40 mg  40 mg Subcutaneous Q24H Debbe Odea, MD   40 mg at 07/15/16 1552  . folic acid (FOLVITE) tablet 1 mg  1 mg Oral Daily Debbe Odea, MD   1 mg at 07/16/16 1050  . LORazepam (ATIVAN) injection 2 mg  2 mg Intramuscular Once Jeryl Columbia, NP      . LORazepam (ATIVAN) injection 2 mg  2 mg Intravenous Q4H PRN Grant Lew, MD       Or  . LORazepam (ATIVAN) tablet 2 mg  2 mg Oral Q4H PRN Grant Lew, MD      . multivitamin with minerals tablet 1 tablet  1 tablet Oral Daily Debbe Odea, MD   1 tablet at 07/16/16 1050  . ondansetron (ZOFRAN) tablet 4 mg  4 mg Oral Q6H PRN Debbe Odea, MD       Or  . ondansetron (ZOFRAN) injection 4 mg  4 mg Intravenous Q6H PRN Debbe Odea, MD      . thiamine (VITAMIN B-1) tablet 100 mg  100 mg Oral Daily Kristen N Ward, DO   100 mg at 07/16/16 1050   Or  . thiamine (B-1) injection 100 mg  100 mg Intravenous Daily Kristen N Ward, DO   100 mg at 07/15/16 0441    Musculoskeletal: Strength & Muscle Tone: decreased Gait & Station: unable to stand Patient leans: N/A  Psychiatric Specialty Exam: Physical Exam as per history and physical   ROS able to complete secondary to poor responses and altered mental status   Blood pressure (!) 142/101, pulse 68, temperature 98.5 F (36.9 C), temperature source Oral, resp. rate 17, height '5\' 11"'$  (1.803 m), weight 79.4 kg (175 lb), SpO2 100 %.Body mass index is 24.41 kg/m.  General Appearance: Disheveled and Guarded  Eye Contact:  Fair  Speech:  Blocked, Slow and Slurred  Volume:  Decreased  Mood:  Depressed and confused.  Affect:  Inappropriate  Thought Process:  Disorganized and Irrelevant  Orientation:  Negative  Thought Content:   Illogical and Rumination  Suicidal Thoughts:  No  Homicidal Thoughts:  No  Memory:  Immediate;   Poor Recent;   Poor Remote;   Poor  Judgement:  Impaired  Insight:  Shallow  Psychomotor Activity:  Decreased  Concentration:  Concentration: Poor and Attention Span: Poor  Recall:  Poor  Fund of Knowledge:  Fair  Language:  Fair  Akathisia:  Negative  Handed:  Right  AIMS (if indicated):     Assets:  Communication Skills Desire for Improvement Intimacy Leisure Time Resilience Social Support  ADL's:  Impaired  Cognition:  Impaired,  Moderate  Sleep:        Treatment Plan Summary: 30 years old male presented with polysubstance intoxication and altered mental status. Patient urine drug screen is positive for cocaine, opiates and amphetamines. Patient has a history of detox treatment and rehabilitation about a year ago.  This psychiatric evaluation is limited secondary to altered mental status Will continue safety sitter as patient cannot contract for safety during this visit Recommended to continue lorazepam detox protocol Monitor for opiate withdrawal symptoms and also needsCIWA protocol May provide clonidine for opioid withdrawal symptoms Daily contact with patient to assess and evaluate symptoms and progress in treatment and Medication management  Appreciate psychiatric consultation and follow up as clinically required Please contact [phone] or [phone] if needs further assistance  Disposition: Supportive therapy provided about ongoing stressors.  Ambrose Finland, MD 07/16/2016 11:14 AM

## 2016-07-16 NOTE — Progress Notes (Addendum)
Pt yelling in room and kicking the end of the bed. Upon approach, pt yelling that he has to use the bathroom. Pt thrashing in the bed, yelling at staff, and kicking/hitting bed rails repeatedly. Pt unable to be verbally deescalated. Security present. Waist belt ordered and applied. PRN ativan administered. Pt calm at this time. On call NP Schorr was updated throughout the night and made aware of pt's state, has not been able to assess the patient on the floor throughout the night. Will continue to monitor.

## 2016-07-16 NOTE — Progress Notes (Signed)
PROGRESS NOTE [facility]   Farron Watrous   XBJ:[id number] DOB: [birth year]  DOA: 07/15/2016 PCP: No PCP Per Patient   Brief Narrative:  Grant Tapia is a 31 y.o. male with medical history significant of alcohol, heroin, methamphetamine, nicotine abuse who was sent to the ER by his girlfriend. He was admitted from [facility] to [facility] due to acute metabolic encephalopathy from possible drug withdrawal.   Subjective: Patient seen and examined, alert and awake. Have no complains. Patient report that he is having visual and tactile hallucinations. Have no suicide ideas, but feels very depressed. Patient has been thru alcohol withdrawal in the past but never intubated.   Assessment & Plan: Acute metabolic encephalopathy - Drug intoxication vs withdrawal  Drug screen positive for Cocaine, Opiates and Methamphetamines  More awake today - on ativan PRN - will add Librium protocol as patient is heavy drinker  Continue clonidine for HTN and tachycardia - avoid BB Zofran PRN   Alcohol abuse - concern for alcohol withdrawals last drink 2 days ago - CIWA done by me 6 Started on Librium protocol  Continue Ativan PRN  CIWA monitoring   Polysubstance abuse  Heroin - withdrawals or intoxication less likely - normal size pupils, no abdominal pain, or piloerection  Methamphetamine - could be withdrawing given initial presentation - treat symptomatically  Cocaine abuse   Depression with initial suicide ideas - he denies to me , + hallucinations  Psych consulted  Continue 1:1 sitter   AKI - dehydration  Resolved wit IVF   DVT prophylaxis: Lovenox  Code Status: FULL  Family Communication: None at bedside  Disposition Plan: Pending psych eval, likely need inpatient psych   Consultants:   Psych  Procedures:     Antimicrobials:      Objective: Vitals:   07/16/16 0114 07/16/16 0126 07/16/16 0551 07/16/16 1407  BP: (!) 191/93 (!) 160/109 (!) 142/101 (!) 148/100  Pulse: 79  68 80   Resp: Temp: 98.5 F (36.9 C)  98.5 F (36.9 C) 97.3 F (36.3 C)  TempSrc: Oral  Oral Oral  SpO2: 98%  100% 100%  Weight:      Height:        Intake/Output Summary (Last 24 hours) at 07/16/16 1531 Last data filed at 07/16/16 1006  Gross per 24 hour  Intake           2502.5 ml  Output             2305 ml  Net            197.5 ml   Filed Weights   07/15/16 0358  Weight: 79.4 kg (175 lb)    Examination:  General exam: Mild anxious Respiratory system: Clear to auscultation. No wheezes,crackle or rhonchi Cardiovascular system: S1 & S2 heard, RRR. No JVD, murmurs, rubs or gallops Gastrointestinal system: Abdomen is nondistended, soft and nontender.  Central nervous system: Alert and oriented, No tremors  Extremities: No pedal edema.   Skin: Multiple scabs on his face  Psychiatry: Judgement and insight appear impair, Mood & affect flat, Visual hallucinations,    Data Reviewed: I have personally reviewed following labs and imaging studies  CBC:  Recent Labs Lab 07/15/16 0428 07/15/16 1413  WBC 11.6* 7.4  NEUTROABS 8.2*  --   HGB 16.4 14.1  HCT 45.6 40.9  MCV 87.4 86.8  PLT 292 213   Basic Metabolic Panel:  Recent Labs Lab 07/15/16 0428 07/15/16 1413 07/15/16 2254  NA 137  --  137  K 3.9  --  4.4  CL 101  --  105  CO2 22  --  26  GLUCOSE 137*  --  117*  BUN 18  --  12  CREATININE 2.18* 1.20 1.01  CALCIUM 10.4*  --  9.0   GFR: Estimated Creatinine Clearance: 116 mL/min (by C-G formula based on SCr of 1.01 mg/dL). Liver Function Tests:  Recent Labs Lab 07/15/16 0428 07/15/16 2254  AST 102* 78*  ALT 130* 104*  ALKPHOS 100 77  BILITOT 1.5* 1.4*  PROT 8.5* 7.4  ALBUMIN 4.6 4.2   No results for input(s): LIPASE, AMYLASE in the last 168 hours. No results for input(s): AMMONIA in the last 168 hours. Coagulation Profile: No results for input(s): INR, PROTIME in the last 168 hours. Cardiac Enzymes: No results for input(s): CKTOTAL, CKMB,  CKMBINDEX, TROPONINI in the last 168 hours. BNP (last 3 results) No results for input(s): PROBNP in the last 8760 hours. HbA1C: No results for input(s): HGBA1C in the last 72 hours. CBG: No results for input(s): GLUCAP in the last 168 hours. Lipid Profile: No results for input(s): CHOL, HDL, LDLCALC, TRIG, CHOLHDL, LDLDIRECT in the last 72 hours. Thyroid Function Tests: No results for input(s): TSH, T4TOTAL, FREET4, T3FREE, THYROIDAB in the last 72 hours. Anemia Panel: No results for input(s): VITAMINB12, FOLATE, FERRITIN, TIBC, IRON, RETICCTPCT in the last 72 hours. Sepsis Labs: No results for input(s): PROCALCITON, LATICACIDVEN in the last 168 hours.  Recent Results (from the past 240 hour(s))  Urine culture     Status: None   Collection Time: 07/15/16  6:35 AM  Result Value Ref Range Status   Specimen Description URINE, CLEAN CATCH  Final   Special Requests NONE  Final   Culture   Final    NO GROWTH Performed at [facility], [address] [place], [place] [address]    Report Status 07/16/2016 FINAL  Final  Culture, blood (Routine X 2) w Reflex to ID Panel     Status: None (Preliminary result)   Collection Time: 07/15/16  3:00 PM  Result Value Ref Range Status   Specimen Description BLOOD LEFT ANTECUBITAL  Final   Special Requests IN PEDIATRIC BOTTLE Blood Culture adequate volume  Final   Culture   Final    NO GROWTH < 24 HOURS Performed at [facility], [address] [place], [place] [address]    Report Status PENDING  Incomplete  Culture, blood (Routine X 2) w Reflex to ID Panel     Status: None (Preliminary result)   Collection Time: 07/15/16  3:10 PM  Result Value Ref Range Status   Specimen Description BLOOD LEFT ARM  Final   Special Requests IN PEDIATRIC BOTTLE Blood Culture adequate volume  Final   Culture   Final    NO GROWTH < 24 HOURS Performed at [facility], [address] [place], [place] [address]    Report Status PENDING   Incomplete      Radiology Studies: No results found.   Scheduled Meds: . chlordiazePOXIDE  50 mg Oral QID   Followed by  . [START ON 07/17/2016] chlordiazePOXIDE  25 mg Oral QID  . cloNIDine  0.1 mg Oral TID  . dicyclomine  20 mg Intramuscular Once  . enoxaparin (LOVENOX) injection  40 mg Subcutaneous Q24H  . folic acid  1 mg Oral Daily  . LORazepam  2 mg Intramuscular Once  . multivitamin  with minerals  1 tablet Oral Daily  . thiamine  100 mg Oral Daily   Or  . thiamine  100 mg Intravenous Daily   Continuous Infusions: . sodium chloride 125 mL/hr at 07/16/16 0553  . sodium chloride 125 mL/hr at 07/16/16 1441     LOS: 0 days    Latrelle Dodrill, MD Pager: Text Page via [email or web address]  [phone]  If 7PM-7AM, please contact night-coverage [email or web address] Password [email or web address] 07/16/2016, 3:31 PM

## 2016-07-16 NOTE — Progress Notes (Addendum)
Pt becoming increasingly agitated and restless throughout the evening. Pt pulled out his IV at 1942. New IV placed and one time dose of ativan administered with no effect. Pt continuously pulling telemetry leads off and actively hallucinating. Pt bit through tubing of IV and pulled out second IV. Becomes physically aggressive with sitter and staff when attempted to redirect. Pt out in hallway yelling. Pt able to be redirected back into his room. PRN dose of ativan administered with no effect. Pt extremely difficult to redirect. Four point restraints ordered and applied with security present. New IV placed. Pt in no distress at this time. 1:1 sitter remains. Will continue to monitor.

## 2016-07-16 NOTE — Care Management Note (Signed)
Case Management Note  Patient Details  Name: Grant Tapia MRN: [id number] Date of Birth: 06-30-1985  Subjective/Objective: 31 y/o m admitted w/Acute metabolic encephalopathy. Hx: etoh/heroin/meth/tobacco abuse.From home.                   Action/Plan:d/c plan home.   Expected Discharge Date:   (unknown)               Expected Discharge Plan:  Home/Self Care  In-House Referral:     Discharge planning Services  CM Consult  Post Acute Care Choice:    Choice offered to:     DME Arranged:    DME Agency:     HH Arranged:    HH Agency:     Status of Service:  In process, will continue to follow  If discussed at Long Length of Stay Meetings, dates discussed:    Additional Comments:  Lanier Clam, RN 07/16/2016, 1:18 PM

## 2016-07-16 NOTE — Care Management Note (Signed)
Case Management Note  Patient Details  Name: Grant Tapia MRN: [id number] Date of Birth: 1986-04-07  Subjective/Objective:  1:1 sitter, current polysubstance abuse-Psych Consulting civil engineer.                  Action/Plan:d/c plan IP rehab   Expected Discharge Date:   (unknown)               Expected Discharge Plan:  IP Rehab Facility  In-House Referral:  Clinical Social Work  Discharge planning Services  CM Consult  Post Acute Care Choice:    Choice offered to:     DME Arranged:    DME Agency:     HH Arranged:    HH Agency:     Status of Service:  In process, will continue to follow  If discussed at Long Length of Stay Meetings, dates discussed:    Additional Comments:  Lanier Clam, RN 07/16/2016, 1:25 PM

## 2016-07-17 MED ORDER — CLONIDINE HCL 0.1 MG PO TABS: 0.1000 mg | ORAL_TABLET | Freq: Two times a day (BID) | ORAL | Status: DC

## 2016-07-17 MED ORDER — CHLORDIAZEPOXIDE HCL 25 MG PO CAPS: ORAL_CAPSULE | ORAL | 0 refills | Status: DC

## 2016-07-17 MED ORDER — CLONIDINE HCL 0.1 MG PO TABS: 0.1000 mg | ORAL_TABLET | Freq: Every day | ORAL | 0 refills | Status: DC

## 2016-07-17 MED ORDER — FOLIC ACID 1 MG PO TABS: 1.0000 mg | ORAL_TABLET | Freq: Every day | ORAL | 0 refills | Status: DC

## 2016-07-17 NOTE — Progress Notes (Addendum)
Interval Note  Patient evaluated by psych whom cleared the patient from psych stand point. Patient is not suicidal or a threat to others. Patient interested on detox, social worker has set an appointment with methadone clinic on Monday at 5 clock. Will d/c on Librium taper as patient report that his not going to drink anymore. Patient verbalizes understanding and expresses that he wants to get clean so he can go back to work. Side effects and risk discussed when mixing librium with alcohol. Patient verbalizes understanding and make some notes of medical prescription. Patient not withdrawing from opiates, as his has been asymptomatic and has had no narcotics during hospital stay. No opioids prescribed. Patient medically stable at the time of discharge.   Full d/c summary to follow.   Latrelle Dodrill, MD

## 2016-07-17 NOTE — Clinical Social Work Psych Note (Signed)
Clinical Social Worker Psych Service Line Progress Note  Clinical Social Worker: Lia Hopping, LCSW Date/Time: 07/16/2016, 1:45 PM   Review of Patient  Overall Medical Condition: not medically stable  Participation Level:  Minimal Participation Quality: Appropriate Other Participation Quality:  Cooperative   Affect: Flat Cognitive: Appropriate Reaction to Medications/Concerns:    Modes of Intervention: Support, Solution-focused   Summary of Progress/Plan at Discharge  Summary of Progress/Plan at Discharge:CSW and psychiatrist met with patient at bedside, explain role and reason for contact. Patient agreeable to talk. Patient lethargic some during conversation but awoke as conversation progressed. Patient reports he was brought to hospital by family. Patient denies being SI/HI. Patient explain that he used "bad marijuana."Patient reports polysubstance use-heroin, alcohol and methamphetamines. Patient reports he has been to treatment in the past but nothing recent. Patient reports going to [facility] and other local treatment facilities.  Patient states he lives in the home with his wife and her three children. Patient reports he works for a [employer], [employer] and is hopeful to get back to work. CSW discussed SA options. Patient reports he does not feel talking a therapist helps. Patient unsure if he is interested in treatment at this time.  CSW will follow up with SA options.

## 2016-07-17 NOTE — Progress Notes (Addendum)
PROGRESS NOTE [facility]   [name]   ONG:[id number] DOB: [birth year]  DOA: 07/15/2016 PCP: No PCP Per Patient   Brief Narrative:  Grant Tapia is a 31 y.o. male with medical history significant of alcohol, heroin, methamphetamine, nicotine abuse who was sent to the ER by his girlfriend. He was admitted from [facility] to [facility] due to acute metabolic encephalopathy from possible drug withdrawal.   Subjective: Patient seen and examined, patient report feeling at 100% him self. Patient was agitated last nigh, got 4 mg of ativan. Patient denies hallucinations, and suicidal ideas. Patient report interest on detox program. Patient is medically clear for detox or psych admission.   Assessment & Plan: Acute metabolic encephalopathy - Drug intoxication vs withdrawal - resolved  Drug screen positive for Cocaine, Opiates and Methamphetamines  On ativan PRN - Continue Librium protocol  Continue clonidine for elevated BP and tachycardia - avoid BB - will taper off  Zofran PRN  Patient medically clear for discharge   Alcohol abuse - concern for alcohol withdrawals last drink 2 days ago - CIWA done by me today 0 Continue Librium protocol  Continue Ativan PRN  CIWA monitoring   Polysubstance abuse  Heroin - withdrawals or intoxication less likely - normal size pupils, no abdominal pain, or piloerection  Methamphetamine - could be withdrawing given initial presentation - treat symptomatically  Cocaine abuse  Patient interested on detox program - he will benefit from Suboxone as outpatient   Depression with initial suicide ideas - he denies to me , + hallucinations  Psych eval yesterday could not be performed due to patient AMS, they will re-eval today -  Continue 1:1 sitter   AKI - dehydration - resolved  D/c IVF   DVT prophylaxis: Lovenox  Code Status: FULL  Family Communication: None at bedside  Disposition Plan: Medically Cleared Pending psych eval, likely need inpatient  psych vs detox program   Consultants:   Psych  Procedures:     Antimicrobials:      Objective: Vitals:   07/16/16 0551 07/16/16 1407 07/16/16 2040 07/17/16 0604  BP: (!) 142/101 (!) 148/100 140/86 113/80  Pulse: 68 80 86 77  Resp: Temp: 98.5 F (36.9 C) 97.3 F (36.3 C) 98.4 F (36.9 C) 98.7 F (37.1 C)  TempSrc: Oral Oral Oral Axillary  SpO2: 100% 100% 100% 100%  Weight:      Height:        Intake/Output Summary (Last 24 hours) at 07/17/16 1008 Last data filed at 07/17/16 0812  Gross per 24 hour  Intake          2708.33 ml  Output                0 ml  Net          2708.33 ml   Filed Weights   07/15/16 0358  Weight: 79.4 kg (175 lb)    Examination:  General exam: NAD Respiratory system: CTA  Cardiovascular system: S1 & S2 heard, RRR. No JVD, murmurs, rubs or gallops Gastrointestinal system: Abdomen is nondistended, soft and nontender.  Central nervous system: Alert and oriented, No tremors  Extremities: No pedal edema.   Skin: Multiple scabs on his face  Psychiatry: Flat affect, judgement impair    Data Reviewed: I have personally reviewed following labs and imaging studies  CBC:  Recent Labs Lab 07/15/16 0428 07/15/16 1413  WBC 11.6* 7.4  NEUTROABS 8.2*  --   HGB 16.4 14.1  HCT 45.6 40.9  MCV 87.4 86.8  PLT 292 213   Basic Metabolic Panel:  Recent Labs Lab 07/15/16 0428 07/15/16 1413 07/15/16 2254  NA 137  --  137  K 3.9  --  4.4  CL 101  --  105  CO2 22  --  26  GLUCOSE 137*  --  117*  BUN 18  --  12  CREATININE 2.18* 1.20 1.01  CALCIUM 10.4*  --  9.0   GFR: Estimated Creatinine Clearance: 116 mL/min (by C-G formula based on SCr of 1.01 mg/dL). Liver Function Tests:  Recent Labs Lab 07/15/16 0428 07/15/16 2254  AST 102* 78*  ALT 130* 104*  ALKPHOS 100 77  BILITOT 1.5* 1.4*  PROT 8.5* 7.4  ALBUMIN 4.6 4.2    Recent Results (from the past 240 hour(s))  Urine culture     Status: None   Collection Time:  07/15/16  6:35 AM  Result Value Ref Range Status   Specimen Description URINE, CLEAN CATCH  Final   Special Requests NONE  Final   Culture   Final    NO GROWTH Performed at [facility], [address], [place], [place] [address]    Report Status 07/16/2016 FINAL  Final  Culture, blood (Routine X 2) w Reflex to ID Panel     Status: None (Preliminary result)   Collection Time: 07/15/16  3:00 PM  Result Value Ref Range Status   Specimen Description BLOOD LEFT ANTECUBITAL  Final   Special Requests IN PEDIATRIC BOTTLE Blood Culture adequate volume  Final   Culture   Final    NO GROWTH < 24 HOURS Performed at [facility], [address], [place], [place] [address]    Report Status PENDING  Incomplete  Culture, blood (Routine X 2) w Reflex to ID Panel     Status: None (Preliminary result)   Collection Time: 07/15/16  3:10 PM  Result Value Ref Range Status   Specimen Description BLOOD LEFT ARM  Final   Special Requests IN PEDIATRIC BOTTLE Blood Culture adequate volume  Final   Culture   Final    NO GROWTH < 24 HOURS Performed at [facility], [address] [place], [place] [address]    Report Status PENDING  Incomplete     Radiology Studies: No results found.   Scheduled Meds: . chlordiazePOXIDE  25 mg Oral QID  . cloNIDine  0.1 mg Oral TID  . dicyclomine  20 mg Intramuscular Once  . enoxaparin (LOVENOX) injection  40 mg Subcutaneous Q24H  . folic acid  1 mg Oral Daily  . LORazepam  2 mg Intramuscular Once  . multivitamin with minerals  1 tablet Oral Daily  . thiamine  100 mg Oral Daily   Or  . thiamine  100 mg Intravenous Daily   Continuous Infusions: . sodium chloride 75 mL/hr at 07/17/16 0017     LOS: 1 day    Grant Dodrill, MD Pager: Text Page via [email or web address]  [phone]  If 7PM-7AM, please contact night-coverage [email or web address] Password [email or web address] 07/17/2016, 10:08 AM

## 2016-07-17 NOTE — Progress Notes (Signed)
CSW met with patient at bedside, and explain role and reason follow up- assist with methadone appointment. CSW provided patient with information for [facility] program in [place]. Patient has an appointment at 5:00 am on Monday. Patient understands the intake process is three hours long. Patient expressed the need to change and became very tearful. CSW provided emotional support.  Patient reports his girlfriend will transport him to appointment.  CSW found no other social needs at this time.   Nicole Sinclair, LCSWA, MSW Clinical Social Worker 5E and Psychiatric Service Line [phone] 07/17/2016  4:20 PM 

## 2016-07-17 NOTE — Consult Note (Signed)
[facility] Psychiatry Consult   Reason for Consult:  Polysubstance intoxication and agitation Referring Physician:  Dr. Quincy Simmonds Patient Identification: Grant Tapia MRN:  [id number] Principal Diagnosis: Acute metabolic encephalopathy Diagnosis:   Patient Active Problem List   Diagnosis Date Noted  . Drug withdrawal ([place]) [F19.939] 07/16/2016  . AKI (acute kidney injury) ([place]) [N17.9] 07/15/2016  . Alcohol abuse [F10.10] 07/15/2016  . Heroin abuse [F11.10] 07/15/2016  . 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 Cocaine abuse [F14.10] 07/15/2016  . Acute metabolic encephalopathy [W54.62] 07/15/2016  . Lactic acidosis [E87.2] 03/29/2014  . Sepsis ([place]) [A41.9] 03/27/2014  . Chest pain [R07.9]   . Headache [R51]   . Neck pain [M54.2]     Total Time spent with patient: 1 hour  Subjective:   Grant Tapia is a 31 y.o. male patient admitted with AMS.  HPI:  Grant Tapia is a 31 y.o. male with medical history significant of alcohol, heroin, methamphetamine, nicotine abuse who was sent to the ER by his girlfriend. He was admitted from [facility] to [address] as a direct admit. Patient seen along with LCSW for this face-to-face psychiatric consultation and evaluation of polysubstance intoxication and agitation. Patient presented to the hospital with altered mental status and his urine drug screen is positive for cocaine, opiates and amphetamines. Patient blood alcohol level is not significant on admission. Patient was not able to participate and has been refusing to talk to this provider. Patient also seems to be in and out of cognitions secondary to multiple drug intoxication and has no significant withdrawal symptoms of opiates or alcohol. Reportedly patient was found with the increased confusion, agitation, yelling and screaming and required more attention from the staff and currently placed with the safety sitter. Patient was not able to endorse current suicidal ideation, homicidal ideation,  psychotic symptoms. She could not tell as he needed detox or rehabilitation treatment at this time because of altered mental status.  Collateral information from his girlfriend, states that he lives with her but she was working out of town and had no seen him in a few days. She returned yesterday and found him confused, agitated, yelling and screaming. He states that he was given a drug from someone which he assumed was Heroin.  Per ER notes, neighbors called the police because of his behavior. He admitted to wanting to die as well. She states he drinks mixed alcoholic drinks all day from waking to sleeping and has been doing so for many years. He uses Heroin every day but as he does not do it around her, she cannot say how much he uses. He uses Methamphetamines as well. She is not aware of any other drug use but his UDS is + for Cocaine, opioids and amphetamines. His last drink was last night around 6.30 PM. Last year he went to a drug rehab program and was prescribed something for depression but has not been taking it.   Past Psychiatric History: Polysubstance abuse vs dependence  07/17/2016 Interval history: Patient seen for the psychiatric consultation follow-up for polysubstance intoxication, agitation and altered mental status. Patient has been stable without symptoms of withdrawals. Patient reported he likes to participate in methadone or Suboxone clinic but does not want to participate in inpatient rehabilitation because has a plans to go back to work. Reportedly did not work more than 4 months due to relapse in drug of abuse. Patient reported his drug of choice is heroin which makes him tired, weak and fatigued and could not function.  Patient reported he does not want to mess up at this time and willing to participate in outpatient treatment program. Patient reported his mother knows the outpatient programs which helped her in the past. Patient has no active symptoms of suicide, homicide ideation,  intention or plans. Patient is no evidence of psychosis. Patient minimizes using other drugs like cocaine and amphetamine.  Risk to Self: Is patient at risk for suicide?: No Risk to Others:   Prior Inpatient Therapy:   Prior Outpatient Therapy:    Past Medical History:  Past Medical History:  Diagnosis Date  . Hypertension    History reviewed. No pertinent surgical history. Family History: History reviewed. No pertinent family history. Family Psychiatric  History: Unable to obtain during this evaluation Social History:  History  Alcohol Use  . Yes     History  Drug Use  . Types: IV    Comment: heroin    Social History   Social History  . Marital status: Single    Spouse name: N/A  . Number of children: N/A  . Years of education: N/A   Social History Main Topics  . Smoking status: Never Smoker  . Smokeless tobacco: Never Used  . Alcohol use Yes  . Drug use: Yes    Types: IV     Comment: heroin  . Sexual activity: Yes   Other Topics Concern  . None   Social History Narrative  . None   Additional Social History:    Allergies:  No Known Allergies  Labs:  Results for orders placed or performed during the hospital encounter of 07/15/16 (from the past 48 hour(s))  HIV antibody (Routine Testing)     Status: None   Collection Time: 07/15/16  2:13 PM  Result Value Ref Range   HIV Screen 4th Generation wRfx Non Reactive Non Reactive    Comment: (NOTE) Performed At: [facility] [place], [place] [id number] Lindon Romp MD JS:[id number]   CBC     Status: None   Collection Time: 07/15/16  2:13 PM  Result Value Ref Range   WBC 7.4 4.0 - 10.5 K/uL   RBC 4.71 4.22 - 5.81 MIL/uL   Hemoglobin 14.1 13.0 - 17.0 g/dL   HCT 40.9 39.0 - 52.0 %   MCV 86.8 78.0 - 100.0 fL   MCH 29.9 26.0 - 34.0 pg   MCHC 34.5 30.0 - 36.0 g/dL   RDW 13.7 11.5 - 15.5 %   Platelets 213 150 - 400 K/uL  Creatinine, serum     Status: None   Collection Time:  07/15/16  2:13 PM  Result Value Ref Range   Creatinine, Ser 1.20 0.61 - 1.24 mg/dL   GFR calc non Af Amer >60 >60 mL/min   GFR calc Af Amer >60 >60 mL/min    Comment: (NOTE) The eGFR has been calculated using the CKD EPI equation. This calculation has not been validated in all clinical situations. eGFR's persistently <60 mL/min signify possible Chronic Kidney Disease.   Sodium, urine, random     Status: None   Collection Time: 07/15/16  2:22 PM  Result Value Ref Range   Sodium, Ur 176 mmol/L    Comment: Performed at [facility] [address] [place], [place] [address]  Creatinine, urine, random     Status: None   Collection Time: 07/15/16  2:22 PM  Result Value Ref Range   Creatinine, Urine 99.36 mg/dL    Comment: Performed at [facility], [place] [address], [place], [place] [address]  Culture, blood (Routine X 2) w Reflex to ID Panel     Status: None (Preliminary result)   Collection Time: 07/15/16  3:00 PM  Result Value Ref Range   Specimen Description BLOOD LEFT ANTECUBITAL    Special Requests IN PEDIATRIC BOTTLE Blood Culture adequate volume    Culture      NO GROWTH < 24 HOURS Performed at [facility] [address] [place], [place] [address]    Report Status PENDING   Culture, blood (Routine X 2) w Reflex to ID Panel     Status: None (Preliminary result)   Collection Time: 07/15/16  3:10 PM  Result Value Ref Range   Specimen Description BLOOD LEFT ARM    Special Requests IN PEDIATRIC BOTTLE Blood Culture adequate volume    Culture      NO GROWTH < 24 HOURS Performed at [facility] [address], [place], [place] [address]    Report Status PENDING   Comprehensive metabolic panel     Status: Abnormal   Collection Time: 07/15/16 10:54 PM  Result Value Ref Range   Sodium 137 135 - 145 mmol/L   Potassium 4.4 3.5 - 5.1 mmol/L   Chloride 105 101 - 111 mmol/L   CO2 26 22 - 32 mmol/L   Glucose, Bld 117 (H) 65 - 99 mg/dL   BUN 12 6 - 20  mg/dL   Creatinine, Ser 1.01 0.61 - 1.24 mg/dL   Calcium 9.0 8.9 - 10.3 mg/dL   Total Protein 7.4 6.5 - 8.1 g/dL   Albumin 4.2 3.5 - 5.0 g/dL   AST 78 (H) 15 - 41 U/L   ALT 104 (H) 17 - 63 U/L   Alkaline Phosphatase 77 38 - 126 U/L   Total Bilirubin 1.4 (H) 0.3 - 1.2 mg/dL   GFR calc non Af Amer >60 >60 mL/min   GFR calc Af Amer >60 >60 mL/min    Comment: (NOTE) The eGFR has been calculated using the CKD EPI equation. This calculation has not been validated in all clinical situations. eGFR's persistently <60 mL/min signify possible Chronic Kidney Disease.    Anion gap 6 5 - 15    Current Facility-Administered Medications  Medication Dose Route Frequency Provider Last Rate Last Dose  . chlordiazePOXIDE (LIBRIUM) capsule 25 mg  25 mg Oral QID Doreatha Lew, MD   25 mg at 07/17/16 1002  . cloNIDine (CATAPRES) tablet 0.1 mg  0.1 mg Oral BID Doreatha Lew, MD      . dicyclomine (BENTYL) injection 20 mg  20 mg Intramuscular Once Kristen N Ward, DO      . diphenoxylate-atropine (LOMOTIL) 2.5-0.025 MG per tablet 1 tablet  1 tablet Oral QID PRN Debbe Odea, MD      . enoxaparin (LOVENOX) injection 40 mg  40 mg Subcutaneous Q24H Debbe Odea, MD   40 mg at 07/16/16 1709  . folic acid (FOLVITE) tablet 1 mg  1 mg Oral Daily Debbe Odea, MD   1 mg at 07/17/16 1001  . LORazepam (ATIVAN) injection 2 mg  2 mg Intramuscular Once Jeryl Columbia, NP      . LORazepam (ATIVAN) injection 2 mg  2 mg Intravenous Q4H PRN Doreatha Lew, MD   2 mg at 07/17/16 0425   Or  . LORazepam (ATIVAN) tablet 2 mg  2 mg Oral Q4H PRN Doreatha Lew, MD      .  multivitamin with minerals tablet 1 tablet  1 tablet Oral Daily Calvert Cantor, MD   1 tablet at 07/17/16 1001  . ondansetron (ZOFRAN) tablet 4 mg  4 mg Oral Q6H PRN Calvert Cantor, MD       Or  . ondansetron (ZOFRAN) injection 4 mg  4 mg Intravenous Q6H PRN Calvert Cantor, MD      . thiamine (VITAMIN B-1) tablet 100 mg  100 mg Oral Daily Kristen  N Ward, DO   100 mg at 07/17/16 1001   Or  . thiamine (B-1) injection 100 mg  100 mg Intravenous Daily Kristen N Ward, DO   100 mg at 07/15/16 0441    Musculoskeletal: Strength & Muscle Tone: decreased Gait & Station: unable to stand Patient leans: N/A  Psychiatric Specialty Exam: Physical Exam  as per history and physical   ROS Patient has no complaints today feeling relaxed and somewhat resting in his bed. No Fever-chills, No Headache, No changes with Vision or hearing, reports vertigo No problems swallowing food or Liquids, No Chest pain, Cough or Shortness of Breath, No Abdominal pain, No Nausea or Vommitting, Bowel movements are regular, No Blood in stool or Urine, No dysuria, No new skin rashes or bruises, No new joints pains-aches,  No new weakness, tingling, numbness in any extremity, No recent weight gain or loss, No polyuria, polydypsia or polyphagia,   A full 10 point Review of Systems was done, except as stated above, all other Review of Systems were negative.  Blood pressure 113/80, pulse 77, temperature 98.7 F (37.1 C), temperature source Axillary, resp. rate 20, height 5\' 11"  (1.803 m), weight 79.4 kg (175 lb), SpO2 100 %.Body mass index is 24.41 kg/m.  General Appearance: Casual  Eye Contact:  Fair  Speech:  Clear and Coherent  Volume:  Normal  Mood:  Anxious and Depressed  Affect:  Appropriate and Congruent  Thought Process:  Coherent and Goal Directed  Orientation:  Negative  Thought Content:  WDL  Suicidal Thoughts:  No  Homicidal Thoughts:  No  Memory:  Immediate;   Poor Recent;   Poor Remote;   Poor  Judgement:  Fair  Insight:  Fair  Psychomotor Activity:  Decreased  Concentration:  Concentration: Good and Attention Span: Good  Recall:  Poor  Fund of Knowledge:  Fair  Language:  Fair  Akathisia:  Negative  Handed:  Right  AIMS (if indicated):     Assets:  Communication Skills Desire for Improvement Intimacy Leisure  Time Resilience Social Support  ADL's:  Impaired  Cognition:  Impaired,  Moderate  Sleep:        Treatment Plan Summary: 31 years old male presented with polysubstance intoxication and altered mental status. Patient urine drug screen is positive for cocaine, opiates and amphetamines. Patient has a history of detox treatment and rehabilitation about a year ago.  Patient showed significant improvement in his mental status and makes his own choice of going to the [facility] outpatient clinic and does not want to participate in inpatient rehabilitation secondary to plan of going back to work and spending time with his girlfriend.  Discontinue [facility] as patient can't contract for safety at this time  Patient is free from withdrawal symptoms of opiates  Daily contact with patient to assess and evaluate symptoms and progress in treatment and Medication management  Appreciate psychiatric consultation and follow up as clinically required Please contact [phone] or [phone] if needs further assistance  Disposition: Will ask LCSW refer  the patient to give local methadone clinic as patient requested. Patient does not meet criteria for inpatient psychiatric hospitalization and has no detox needs. Supportive therapy provided about ongoing stressors.  Ambrose Finland, MD 07/17/2016 11:48 AM

## 2016-07-18 ENCOUNTER — Emergency Department (HOSPITAL_COMMUNITY): Payer: Self-pay

## 2016-07-18 ENCOUNTER — Inpatient Hospital Stay (HOSPITAL_COMMUNITY)
Admission: EM | Admit: 2016-07-18 | Discharge: 2016-07-20 | DRG: 917 | Disposition: A | Discharge status: Home / Self Care | Payer: Self-pay | Attending: Internal Medicine | Admitting: Internal Medicine

## 2016-07-18 ENCOUNTER — Encounter (HOSPITAL_COMMUNITY): Payer: Self-pay

## 2016-07-18 DIAGNOSIS — T401X4A Poisoning by heroin, undetermined, initial encounter: Secondary | ICD-10-CM

## 2016-07-18 DIAGNOSIS — T401X1A Poisoning by heroin, accidental (unintentional), initial encounter: Principal | ICD-10-CM | POA: Diagnosis present

## 2016-07-18 DIAGNOSIS — F1010 Alcohol abuse, uncomplicated: Secondary | ICD-10-CM | POA: Diagnosis present

## 2016-07-18 DIAGNOSIS — N182 Chronic kidney disease, stage 2 (mild): Secondary | ICD-10-CM | POA: Diagnosis present

## 2016-07-18 DIAGNOSIS — J9811 Atelectasis: Secondary | ICD-10-CM | POA: Diagnosis present

## 2016-07-18 DIAGNOSIS — F1110 Opioid abuse, uncomplicated: Secondary | ICD-10-CM | POA: Diagnosis present

## 2016-07-18 DIAGNOSIS — I129 Hypertensive chronic kidney disease with stage 1 through stage 4 chronic kidney disease, or unspecified chronic kidney disease: Secondary | ICD-10-CM | POA: Diagnosis present

## 2016-07-18 DIAGNOSIS — T17908A Unspecified foreign body in respiratory tract, part unspecified causing other injury, initial encounter: Secondary | ICD-10-CM

## 2016-07-18 DIAGNOSIS — T50901A Poisoning by unspecified drugs, medicaments and biological substances, accidental (unintentional), initial encounter: Secondary | ICD-10-CM | POA: Diagnosis present

## 2016-07-18 DIAGNOSIS — F1510 Other stimulant abuse, uncomplicated: Secondary | ICD-10-CM | POA: Diagnosis present

## 2016-07-18 DIAGNOSIS — R000 Tachycardia, unspecified: Secondary | ICD-10-CM

## 2016-07-18 DIAGNOSIS — R410 Disorientation, unspecified: Secondary | ICD-10-CM

## 2016-07-18 DIAGNOSIS — Z79899 Other long term (current) drug therapy: Secondary | ICD-10-CM

## 2016-07-18 DIAGNOSIS — E876 Hypokalemia: Secondary | ICD-10-CM | POA: Diagnosis present

## 2016-07-18 DIAGNOSIS — N179 Acute kidney failure, unspecified: Secondary | ICD-10-CM | POA: Diagnosis present

## 2016-07-18 DIAGNOSIS — F1410 Cocaine abuse, uncomplicated: Secondary | ICD-10-CM | POA: Diagnosis present

## 2016-07-18 DIAGNOSIS — R451 Restlessness and agitation: Secondary | ICD-10-CM

## 2016-07-18 DIAGNOSIS — G9341 Metabolic encephalopathy: Secondary | ICD-10-CM | POA: Diagnosis present

## 2016-07-18 DIAGNOSIS — D72829 Elevated white blood cell count, unspecified: Secondary | ICD-10-CM | POA: Diagnosis present

## 2016-07-18 HISTORY — DX: Opioid abuse, uncomplicated: F11.10

## 2016-07-18 HISTORY — DX: Poisoning by unspecified drugs, medicaments and biological substances, accidental (unintentional), initial encounter: T50.901A

## 2016-07-18 LAB — URINALYSIS, ROUTINE W REFLEX MICROSCOPIC
BILIRUBIN URINE: NEGATIVE
Bilirubin Urine: NEGATIVE
GLUCOSE, UA: NEGATIVE mg/dL
Glucose, UA: NEGATIVE mg/dL
HGB URINE DIPSTICK: NEGATIVE
Ketones, ur: NEGATIVE mg/dL
Ketones, ur: NEGATIVE mg/dL
LEUKOCYTES UA: NEGATIVE
Leukocytes, UA: NEGATIVE
NITRITE: NEGATIVE
Nitrite: NEGATIVE
PH: 5 (ref 5.0–8.0)
PROTEIN: NEGATIVE mg/dL
Protein, ur: 30 mg/dL — AB
SPECIFIC GRAVITY, URINE: 1.019 (ref 1.005–1.030)
Specific Gravity, Urine: 1.012 (ref 1.005–1.030)
Squamous Epithelial / HPF: NONE SEEN
pH: 5 (ref 5.0–8.0)

## 2016-07-18 LAB — RAPID URINE DRUG SCREEN, HOSP PERFORMED
AMPHETAMINES: POSITIVE — AB
BARBITURATES: NOT DETECTED
BENZODIAZEPINES: POSITIVE — AB
Cocaine: NOT DETECTED
Opiates: NOT DETECTED
TETRAHYDROCANNABINOL: NOT DETECTED

## 2016-07-18 LAB — I-STAT CHEM 8, ED
BUN: 10 mg/dL (ref 6–20)
CALCIUM ION: 1.13 mmol/L — AB (ref 1.15–1.40)
CREATININE: 1 mg/dL (ref 0.61–1.24)
Chloride: 107 mmol/L (ref 101–111)
Glucose, Bld: 191 mg/dL — ABNORMAL HIGH (ref 65–99)
HEMATOCRIT: 45 % (ref 39.0–52.0)
HEMOGLOBIN: 15.3 g/dL (ref 13.0–17.0)
Potassium: 3 mmol/L — ABNORMAL LOW (ref 3.5–5.1)
Sodium: 144 mmol/L (ref 135–145)
TCO2: 24 mmol/L (ref 0–100)

## 2016-07-18 LAB — COMPREHENSIVE METABOLIC PANEL
ALBUMIN: 3.7 g/dL (ref 3.5–5.0)
ALT: 80 U/L — ABNORMAL HIGH (ref 17–63)
ANION GAP: 11 (ref 5–15)
AST: 68 U/L — AB (ref 15–41)
Alkaline Phosphatase: 74 U/L (ref 38–126)
BILIRUBIN TOTAL: 0.7 mg/dL (ref 0.3–1.2)
BUN: 9 mg/dL (ref 6–20)
CHLORIDE: 108 mmol/L (ref 101–111)
CO2: 21 mmol/L — ABNORMAL LOW (ref 22–32)
Calcium: 8.6 mg/dL — ABNORMAL LOW (ref 8.9–10.3)
Creatinine, Ser: 1.2 mg/dL (ref 0.61–1.24)
GFR calc Af Amer: >60 mL/min (ref >60)
GFR calc non Af Amer: >60 mL/min (ref >60)
GLUCOSE: 197 mg/dL — AB (ref 65–99)
POTASSIUM: 3 mmol/L — AB (ref 3.5–5.1)
SODIUM: 140 mmol/L (ref 135–145)
Total Protein: 6.8 g/dL (ref 6.5–8.1)

## 2016-07-18 LAB — CBC WITH DIFFERENTIAL/PLATELET
BASOS ABS: 0.1 10*3/uL (ref 0.0–0.1)
Basophils Relative: 1 %
EOS ABS: 0.2 10*3/uL (ref 0.0–0.7)
EOS PCT: 2 %
HEMATOCRIT: 42.6 % (ref 39.0–52.0)
Hemoglobin: 14.6 g/dL (ref 13.0–17.0)
LYMPHS ABS: 1.2 10*3/uL (ref 0.7–4.0)
Lymphocytes Relative: 10 %
MCH: 30.9 pg (ref 26.0–34.0)
MCHC: 34.3 g/dL (ref 30.0–36.0)
MCV: 90.1 fL (ref 78.0–100.0)
MONO ABS: 1.1 10*3/uL — AB (ref 0.1–1.0)
MONOS PCT: 9 %
Neutro Abs: 9.8 10*3/uL — ABNORMAL HIGH (ref 1.7–7.7)
Neutrophils Relative %: 78 %
PLATELETS: 326 10*3/uL (ref 150–400)
RBC: 4.73 MIL/uL (ref 4.22–5.81)
RDW: 13.7 % (ref 11.5–15.5)
WBC: 12.5 10*3/uL — ABNORMAL HIGH (ref 4.0–10.5)

## 2016-07-18 LAB — SALICYLATE LEVEL

## 2016-07-18 LAB — I-STAT ARTERIAL BLOOD GAS, ED
ACID-BASE DEFICIT: 2 mmol/L (ref 0.0–2.0)
Bicarbonate: 24.8 mmol/L (ref 20.0–28.0)
O2 SAT: 100 %
PH ART: 7.3 — AB (ref 7.350–7.450)
Patient temperature: 98.6
TCO2: 26 mmol/L (ref 0–100)
pCO2 arterial: 50.4 mmHg — ABNORMAL HIGH (ref 32.0–48.0)
pO2, Arterial: 461 mmHg — ABNORMAL HIGH (ref 83.0–108.0)

## 2016-07-18 LAB — TROPONIN I

## 2016-07-18 LAB — CBG MONITORING, ED: Glucose-Capillary: 88 mg/dL (ref 65–99)

## 2016-07-18 LAB — ACETAMINOPHEN LEVEL

## 2016-07-18 LAB — MRSA PCR SCREENING: MRSA BY PCR: NEGATIVE

## 2016-07-18 LAB — CK: Total CK: 653 U/L — ABNORMAL HIGH (ref 49–397)

## 2016-07-18 MED ORDER — FENTANYL CITRATE (PF) 100 MCG/2ML IJ SOLN
100.0000 ug | INTRAMUSCULAR | Status: DC | PRN
  Administered 2016-07-19: 100 ug via INTRAVENOUS
  Filled 2016-07-18: qty 2

## 2016-07-18 MED ORDER — ORAL CARE MOUTH RINSE
15.0000 mL | OROMUCOSAL | Status: DC
  Administered 2016-07-19 (×2): 15 mL via OROMUCOSAL

## 2016-07-18 MED ORDER — SODIUM CHLORIDE 0.9 % IV BOLUS (SEPSIS)
1000.0000 mL | Freq: Once | INTRAVENOUS | Status: AC
Start: 2016-07-18 — End: 2016-07-18
  Administered 2016-07-18: 1000 mL via INTRAVENOUS

## 2016-07-18 MED ORDER — CLONIDINE HCL 0.1 MG PO TABS
0.1000 mg | ORAL_TABLET | Freq: Every day | ORAL | Status: DC
  Administered 2016-07-19 – 2016-07-20 (×2): 0.1 mg via ORAL
  Filled 2016-07-18 (×2): qty 1

## 2016-07-18 MED ORDER — CHLORHEXIDINE GLUCONATE 0.12% ORAL RINSE (MEDLINE KIT)
15.0000 mL | Freq: Two times a day (BID) | OROMUCOSAL | Status: DC
  Administered 2016-07-18 – 2016-07-19 (×2): 15 mL via OROMUCOSAL

## 2016-07-18 MED ORDER — SODIUM CHLORIDE 0.9 % IV SOLN
INTRAVENOUS | Status: AC | PRN
  Administered 2016-07-18: 1000 mL via INTRAVENOUS

## 2016-07-18 MED ORDER — SODIUM CHLORIDE 0.9 % IV SOLN: 250.0000 mL | INTRAVENOUS | Status: DC | PRN

## 2016-07-18 MED ORDER — FAMOTIDINE 20 MG PO TABS
20.0000 mg | ORAL_TABLET | Freq: Two times a day (BID) | ORAL | Status: DC
Start: 2016-07-18 — End: 2016-07-20
  Administered 2016-07-18 – 2016-07-20 (×4): 20 mg via ORAL
  Filled 2016-07-18 (×4): qty 1

## 2016-07-18 MED ORDER — POTASSIUM CHLORIDE 20 MEQ/15ML (10%) PO SOLN
40.0000 meq | Freq: Once | ORAL | Status: AC
  Administered 2016-07-18: 40 meq via ORAL
  Filled 2016-07-18: qty 30

## 2016-07-18 MED ORDER — FENTANYL CITRATE (PF) 100 MCG/2ML IJ SOLN: 100.0000 ug | INTRAMUSCULAR | Status: DC | PRN

## 2016-07-18 MED ORDER — PROPOFOL 1000 MG/100ML IV EMUL
0.0000 ug/kg/min | INTRAVENOUS | Status: DC
  Administered 2016-07-18: 30 ug/kg/min via INTRAVENOUS
  Administered 2016-07-18: 40 ug/kg/min via INTRAVENOUS
  Filled 2016-07-18 (×2): qty 100

## 2016-07-18 MED ORDER — CHLORDIAZEPOXIDE HCL 5 MG PO CAPS
25.0000 mg | ORAL_CAPSULE | Freq: Two times a day (BID) | ORAL | Status: DC
  Administered 2016-07-18 – 2016-07-19 (×2): 25 mg via ORAL
  Filled 2016-07-18 (×2): qty 1

## 2016-07-18 MED ORDER — FOLIC ACID 1 MG PO TABS
1.0000 mg | ORAL_TABLET | Freq: Every day | ORAL | Status: DC
  Administered 2016-07-19 – 2016-07-20 (×2): 1 mg via ORAL
  Filled 2016-07-18 (×2): qty 1

## 2016-07-18 MED ORDER — PROPOFOL 1000 MG/100ML IV EMUL
INTRAVENOUS | Status: AC
  Administered 2016-07-18: 30 ug/kg/min via INTRAVENOUS
  Filled 2016-07-18: qty 100

## 2016-07-18 MED ORDER — ENOXAPARIN SODIUM 40 MG/0.4ML ~~LOC~~ SOLN
40.0000 mg | Freq: Every day | SUBCUTANEOUS | Status: DC
  Administered 2016-07-18 – 2016-07-19 (×2): 40 mg via SUBCUTANEOUS
  Filled 2016-07-18 (×2): qty 0.4

## 2016-07-18 MED ORDER — ETOMIDATE 2 MG/ML IV SOLN
INTRAVENOUS | Status: AC | PRN
  Administered 2016-07-18: 30 mg via INTRAVENOUS

## 2016-07-18 MED ORDER — ROCURONIUM BROMIDE 50 MG/5ML IV SOLN
INTRAVENOUS | Status: AC | PRN
  Administered 2016-07-18: 100 mg via INTRAVENOUS

## 2016-07-18 NOTE — Progress Notes (Signed)
Patient transported to CT and back to trauma room C without complications. 

## 2016-07-18 NOTE — ED Triage Notes (Addendum)
Per EMS, pt found in floor in [place] with heroin packet and syringe, pt was apneic but had pulses. Given 2 of narcan and pt became combative, pt given 5 haldol and 15 versed. Pt in cuffs and [employer] with pt. HR 208, ems could not get BP due to combative. Pt has nasal trumpet and being bagged upon arrival. Pt on [place].

## 2016-07-18 NOTE — Code Documentation (Signed)
Portable in room.  

## 2016-07-18 NOTE — ED Notes (Signed)
Critical care in room.  

## 2016-07-18 NOTE — ED Notes (Signed)
Pt no longer combative, pt is sedated and calm.

## 2016-07-18 NOTE — Code Documentation (Addendum)
Pt back in room from CT 

## 2016-07-18 NOTE — Procedures (Signed)
Intubation Procedure Note Grant Tapia [id number] 1986-02-28  Procedure: Intubation Indications: Airway protection and maintenance  Procedure Details Consent: Unable to obtain consent because of altered level of consciousness. Time Out: Verified patient identification, verified procedure, site/side was marked, verified correct patient position, special equipment/implants available, medications/allergies/relevent history reviewed, required imaging and test results available.  Performed  Maximum sterile technique was used including antiseptics, cap, gloves, gown, hand hygiene, mask and sheet.  Miller and 4    Evaluation Hemodynamic Status: BP stable throughout; O2 sats: stable throughout Patient's Current Condition: stable Complications: No apparent complications Patient did tolerate procedure well. Chest X-ray ordered to verify placement.  CXR: tube position high-repostitioned.  Patient intubated by ED MD.  Positive color change noted.  Condensation noted in ETT. Bilateral breath sounds noted.  Chest xray performed with ETT being advanced 1cm after xray.  Patient tolerated well.    Durwin Glaze 07/18/2016

## 2016-07-18 NOTE — H&P (Signed)
PULMONARY / CRITICAL CARE MEDICINE   Name: Grant Tapia MRN: [id number] DOB: 1986/02/18    ADMISSION DATE:  07/18/2016 CONSULTATION DATE:  07/18/16  REFERRING MD:  Dr. Stacy Gardner  CHIEF COMPLAINT:  Heroin overdose  HISTORY OF PRESENT ILLNESS:   Grant Tapia is a 31 y.o. male with on-going polysubstance abuse of alcohol, heroin, nicotine and methamphetamines who was admitted on 07/15/16 to [facility] hospital for agitation from drug abuse who was discharged yesterday evening with librium taper and outpatient follow up in an outpatient substance abuse program. He presented this evening via EMS after he was found unconscious in a local [employer] with a needle and a bag of heroin by him. He was spontaneously breathing after receiving narcan on the field but became combative and agitated requiring  haldol and  versed. He was intubated in the trauma bay for airway protection and placed on propofol infusion. PCCM was consulted for admission.   PAST MEDICAL HISTORY :  He  has a past medical history of Heroin abuse; Hypertension; and Overdose.  PAST SURGICAL HISTORY: He  has no past surgical history on file.  No Known Allergies  No current facility-administered medications on file prior to encounter.    Current Outpatient Prescriptions on File Prior to Encounter  Medication Sig  . cloNIDine (CATAPRES) 0.1 MG tablet Take 1 tablet (0.1 mg total) by mouth daily.  . folic acid (FOLVITE) 1 MG tablet Take 1 tablet (1 mg total) by mouth daily.    FAMILY HISTORY:  His has no family status information on file.    SOCIAL HISTORY: He  reports that he has never smoked. He has never used smokeless tobacco. He reports that he drinks alcohol. He reports that he uses drugs, including IV.  REVIEW OF SYSTEMS:   Unable to be obtained due to pt being intubated and sedation. No family available   VITAL SIGNS: BP 134/88   Pulse 100   Temp 97 F (36.1 C) Comment (Src): temp foley  Resp 18   Ht   (1.803 m)   Wt 90 kg (198 lb 6.6 oz)   SpO2 99%   BMI 27.67 kg/m   HEMODYNAMICS:    VENTILATOR SETTINGS: Vent Mode: PRVC FiO2 (%):  [40 %-100 %] 40 % Set Rate:  [15 bmp-18 bmp] 18 bmp Vt Set:  [600 mL] 600 mL PEEP:  [5 cmH20] 5 cmH20 Plateau Pressure:  [15 cmH20-16 cmH20] 16 cmH20  INTAKE / OUTPUT: No intake/output data recorded.  PHYSICAL EXAMINATION: General:  Intubated, sedated Neuro:  Pupils pinpoint but reactive, no facial asymetry, decreased tone in extremities while on propofol, no clonus or hyperreflexia HEENT:  MMM, ETT in place Cardiovascular:  RRR, no murmurs rubs or gallops appreciated Lungs:  CTAB Abdomen:  Soft, nondistended, no rebound or gaurding Musculoskeletal:  Multiple IV track marks in both antecubital fossa Skin:  Multiple tattoos  LABS:  BMET  Recent Labs Lab 07/15/16 0428  07/15/16 2254 07/18/16 1813 07/18/16 1825  NA 137  --  137 140 144  K 3.9  --  4.4 3.0* 3.0*  CL 101  --  105 108 107  CO2 22  --  26 21*  --   BUN 18  --  CREATININE 2.18*  < > 1.01 1.20 1.00  GLUCOSE 137*  --  117* 197* 191*  < > = values in this interval not displayed.  Electrolytes  Recent Labs Lab 07/15/16 0428 07/15/16 2254 07/18/16 1813  CALCIUM 10.4*  9.0 8.6*    CBC  Recent Labs Lab 07/15/16 0428 07/15/16 1413 07/18/16 1813 07/18/16 1825  WBC 11.6* 7.4 12.5*  --   HGB 16.4 14.1 14.6 15.3  HCT 45.6 40.9 42.6 45.0  PLT 292 213 326  --     Coag's No results for input(s): APTT, INR in the last 168 hours.  Sepsis Markers No results for input(s): LATICACIDVEN, PROCALCITON, O2SATVEN in the last 168 hours.  ABG  Recent Labs Lab 07/18/16 1854  PHART 7.300*  PCO2ART 50.4*  PO2ART 461.0*    Liver Enzymes  Recent Labs Lab 07/15/16 0428 07/15/16 2254 07/18/16 1813  AST 102* 78* 68*  ALT 130* 104* 80*  ALKPHOS 100 77 74  BILITOT 1.5* 1.4* 0.7  ALBUMIN 4.6 4.2 3.7    Cardiac Enzymes  Recent Labs Lab 07/18/16 1813   TROPONINI <0.03    Glucose No results for input(s): GLUCAP in the last 168 hours.  Imaging Ct Head Wo Contrast  Result Date: 07/18/2016 CLINICAL DATA:  Overdose, unresponsive EXAM: CT HEAD WITHOUT CONTRAST TECHNIQUE: Contiguous axial images were obtained from the base of the skull through the vertex without intravenous contrast. COMPARISON:  MR brain dated 03/27/2014 FINDINGS: Brain: No evidence of acute infarction, hemorrhage, hydrocephalus, extra-axial collection or mass lesion/mass effect. Vascular: No hyperdense vessel or unexpected calcification. Skull: Normal. Negative for fracture or focal lesion. Sinuses/Orbits: Mild partial opacification the right maxillary sinus. Visualized paranasal sinuses and mastoid air cells are otherwise clear. Other: None. IMPRESSION: Normal head CT. Electronically Signed   By: Charline Bills M.D.   On: 07/18/2016 18:43   Dg Chest Port 1 View  Result Date: 07/18/2016 CLINICAL DATA:  Endotracheal tube and enteric tube placement. EXAM: PORTABLE CHEST 1 VIEW COMPARISON:  03/28/2014 FINDINGS: Endotracheal tube has tip 5.1 cm above the carina. Enteric tube courses into the stomach and off the inferior portion of the film as tip is not visualized. Lungs are adequately inflated without effusion or pneumothorax. Possible mild opacification over the right apex. Cardiomediastinal silhouette and remainder of the exam is unchanged. IMPRESSION: Possible mild opacification over the right apex. Tubes and lines as described. Electronically Signed   By: Elberta Fortis M.D.   On: 07/18/2016 18:28    DISCUSSION: Grant Tapia is a 31 y.o. male with on-going polysubstance abuse of alcohol, heroin, nicotine and methamphetamines readmitted after discharge on 07/17/16 with suspected heroin overdose resulting in intubation and mechanical ventilation. CT Head negative for acute bleed or fracture. CXR has RUL opacity which may be result of aspiration. He was intubated for airway protection  due to combativeness requiring large amounts of sedating medications. Will wean sedation over time and assess mental status. Low suspicion for sepsis or other pathology.  ASSESSMENT / PLAN:  PULMONARY A: Intubated for airway protection on mechanical ventilation P:   Lung protective ventilation, VAP bundle CXR tomorrow to f/u RUL opacity  CARDIOVASCULAR A:  Hypertensive  P:  Will continue clonidine 0.1mg  daily, which was a discharge med  RENAL A:   Creatinine 1.0, likely reflecting mild CKD, unchanged from yesterday Hypokalemia P:   Daily BMP and electrolytes Received KCl in ED  GASTROINTESTINAL A:   NPO P:   NG tube placed  HEMATOLOGIC A:   Leukocytosis likely reactive stress response P:  Enoxaparin + SCDs for DVT prophylaxis  INFECTIOUS A:   Low suspicion for sepsis or infection P:   Will monitor for signs of sepsis  ENDOCRINE A:   No issues P:  Glucose checks per protocol  NEUROLOGIC A:   Acute encephalopathy from drug overdose - suspect heroin P:   Will use propofol and scheduled librium  BID (he was on a taper on DC yesterday) Will wean propofol to wake him up over next 12 hours Urine tox screen pending RASS goal: 0  FAMILY  - Updates: None present  - Inter-disciplinary family meet or Palliative Care meeting due by:  07/25/16  30 mins Critical Care time spent on this admission  Cornell Barman, MD Pulmonary and Critical Care Medicine [facility] Pager: [phone]  07/18/2016, 7:42 PM

## 2016-07-18 NOTE — ED Provider Notes (Signed)
[facility]-EMERGENCY DEPT Provider Note   CSN: [id number] Arrival date & time: 07/18/16  1801     History   Chief Complaint Chief Complaint  Patient presents with  . Drug Overdose    HPI Grant Tapia is a 31 y.o. male.  The history is provided by the patient, the EMS personnel and the police.  Drug Overdose  This is a new problem. The current episode started less than 1 hour ago. The problem occurs constantly. The problem has been gradually worsening. Exacerbated by: Pt extremely agitated after receiving narcan. He has tried nothing for the symptoms.    Past Medical History:  Diagnosis Date  . Heroin abuse   . Hypertension   . Overdose     Patient Active Problem List   Diagnosis Date Noted  . Overdose 07/18/2016  . Drug withdrawal (HCC) 07/16/2016  . AKI (acute kidney injury) (HCC) 07/15/2016  . Alcohol abuse 07/15/2016  . Heroin abuse 07/15/2016  . Methamphetamine abuse 07/15/2016  . Cocaine abuse 07/15/2016  . Acute metabolic encephalopathy 07/15/2016  . Lactic acidosis 03/29/2014  . Sepsis (HCC) 03/27/2014  . Chest pain   . Headache   . Neck pain     History reviewed. No pertinent surgical history.     Home Medications    Prior to Admission medications   Medication Sig Start Date End Date Taking? Authorizing Provider  cloNIDine (CATAPRES) 0.1 MG tablet Take 1 tablet (0.1 mg total) by mouth daily. 07/17/16 07/20/16  Lenox Ponds, MD  folic acid (FOLVITE) 1 MG tablet Take 1 tablet (1 mg total) by mouth daily. 07/18/16   Lenox Ponds, MD    Family History History reviewed. No pertinent family history.  Social History Social History  Substance Use Topics  . Smoking status: Never Smoker  . Smokeless tobacco: Never Used  . Alcohol use Yes     Allergies   Patient has no known allergies.   Review of Systems Review of Systems  Unable to perform ROS: Mental status change     Physical Exam Updated Vital Signs BP 134/88   Pulse 100   Temp 97  F (36.1 C) Comment (Src): temp foley  Resp 18   Ht  (1.803 m)   Wt 90 kg   SpO2 99%   BMI 27.67 kg/m   Physical Exam  Constitutional: He appears well-developed and well-nourished. He appears listless. He is uncooperative. He appears distressed.  HENT:  Head: Normocephalic and atraumatic.  Eyes: Conjunctivae are normal.  Neck: Neck supple.  Cardiovascular: Regular rhythm.  Tachycardia present.   No murmur heard. Pulmonary/Chest: Effort normal and breath sounds normal. No respiratory distress.  Abdominal: Soft. There is no tenderness.  Musculoskeletal: He exhibits no edema.  Neurological: He appears listless. GCS eye subscore is 1. GCS verbal subscore is 2. GCS motor subscore is 4.  Skin: Skin is warm. He is diaphoretic.  Psychiatric: His affect is angry. He is agitated.  Nursing note and vitals reviewed.    ED Treatments / Results  Labs (all labs ordered are listed, but only abnormal results are displayed) Labs Reviewed  CK - Abnormal; Notable for the following:       Result Value   Total CK 653 (*)    All other components within normal limits  CBC WITH DIFFERENTIAL/PLATELET - Abnormal; Notable for the following:    WBC 12.5 (*)    Neutro Abs 9.8 (*)    Monocytes Absolute 1.1 (*)    All other  components within normal limits  COMPREHENSIVE METABOLIC PANEL - Abnormal; Notable for the following:    Potassium 3.0 (*)    CO2 21 (*)    Glucose, Bld 197 (*)    Calcium 8.6 (*)    AST 68 (*)    ALT 80 (*)    All other components within normal limits  ACETAMINOPHEN LEVEL - Abnormal; Notable for the following:    Acetaminophen (Tylenol), Serum <10 (*)    All other components within normal limits  I-STAT CHEM 8, ED - Abnormal; Notable for the following:    Potassium 3.0 (*)    Glucose, Bld 191 (*)    Calcium, Ion 1.13 (*)    All other components within normal limits  I-STAT ARTERIAL BLOOD GAS, ED - Abnormal; Notable for the following:    pH, Arterial 7.300 (*)     pCO2 arterial 50.4 (*)    pO2, Arterial 461.0 (*)    All other components within normal limits  SALICYLATE LEVEL  TROPONIN I  RAPID URINE DRUG SCREEN, HOSP PERFORMED  URINALYSIS, ROUTINE W REFLEX MICROSCOPIC    EKG  EKG Interpretation None       Radiology Ct Head Wo Contrast  Result Date: 07/18/2016 CLINICAL DATA:  Overdose, unresponsive EXAM: CT HEAD WITHOUT CONTRAST TECHNIQUE: Contiguous axial images were obtained from the base of the skull through the vertex without intravenous contrast. COMPARISON:  MR brain dated 03/27/2014 FINDINGS: Brain: No evidence of acute infarction, hemorrhage, hydrocephalus, extra-axial collection or mass lesion/mass effect. Vascular: No hyperdense vessel or unexpected calcification. Skull: Normal. Negative for fracture or focal lesion. Sinuses/Orbits: Mild partial opacification the right maxillary sinus. Visualized paranasal sinuses and mastoid air cells are otherwise clear. Other: None. IMPRESSION: Normal head CT. Electronically Signed   By: Charline Bills M.D.   On: 07/18/2016 18:43   Dg Chest Port 1 View  Result Date: 07/18/2016 CLINICAL DATA:  Endotracheal tube and enteric tube placement. EXAM: PORTABLE CHEST 1 VIEW COMPARISON:  03/28/2014 FINDINGS: Endotracheal tube has tip 5.1 cm above the carina. Enteric tube courses into the stomach and off the inferior portion of the film as tip is not visualized. Lungs are adequately inflated without effusion or pneumothorax. Possible mild opacification over the right apex. Cardiomediastinal silhouette and remainder of the exam is unchanged. IMPRESSION: Possible mild opacification over the right apex. Tubes and lines as described. Electronically Signed   By: Elberta Fortis M.D.   On: 07/18/2016 18:28    Procedures Procedure Name: Intubation Date/Time: 07/18/2016 6:26 PM Performed by: Stacy Gardner Pre-anesthesia Checklist: Patient identified, Emergency Drugs available, Suction available, Patient being monitored  and Timeout performed Oxygen Delivery Method: Ambu bag Intubation Type: IV induction and Rapid sequence Laryngoscope Size: Miller and 4 Grade View: Grade I Tube size: 8.0 mm Number of attempts: 1 Airway Equipment and Method: Stylet Placement Confirmation: ETT inserted through vocal cords under direct vision,  Positive ETCO2 and Breath sounds checked- equal and bilateral Secured at: 24 cm Tube secured with: ETT holder      (including critical care time)  Medications Ordered in ED Medications  0.9 %  sodium chloride infusion ( Intravenous Stopped 07/18/16 1919)  etomidate (AMIDATE) injection (30 mg Intravenous Given 07/18/16 1809)  rocuronium (ZEMURON) injection (100 mg Intravenous Given 07/18/16 1810)  propofol (DIPRIVAN) 1000 MG/100ML infusion (40 mcg/kg/min  79.4 kg Intravenous New Bag/Given 07/18/16 1819)  fentaNYL (SUBLIMAZE) injection 100 mcg (not administered)  fentaNYL (SUBLIMAZE) injection 100 mcg (not administered)  sodium chloride 0.9 % bolus  1,000 mL (0 mLs Intravenous Stopped 07/18/16 1915)  potassium chloride 20 MEQ/15ML (10%) solution 40 mEq (40 mEq Oral Given 07/18/16 1927)  sodium chloride 0.9 % bolus 1,000 mL (1,000 mLs Intravenous New Bag/Given 07/18/16 1931)     Initial Impression / Assessment and Plan / ED Course  I have reviewed the triage vital signs and the nursing notes.  Pertinent labs & imaging results that were available during my care of the patient were reviewed by me and considered in my medical decision making (see chart for details).     History, physical exam and ROS limited due to patients acuity of condition and altered mental status.  Level of 5 exception of care due to acuity of condition  31 year old male presents in the setting of heroin overdose. EMS was called to [facility] as patient was found nonresponsive in bathroom with medial and arm and heroin beside patient. On arrival of EMS patient had pulses because was apneic. Supplemental breathing  performed by EMS and patient given 2 mg of Narcan. Patient then had extreme agitation and required 15 mg of Versed, 5 mg of Haldol, 50 mg of Benadryl in route with continued significant agitation. This required multiple police and EMS to hold the patient down. Patient with significant tachycardia.  On arrival patient with intermittent agitation and apnea. Patient diaphoretic with significant tachycardia. EKG reveals sinus tachycardia with no signs of ST segment elevation or depression. Patient unable to protect airway and patient intubated as noted above without occasions. Patient was intubated with 80 ET tube and 24 at the lip. Patient was not a difficult airway. The patient started on IV fluids and CT head and chest x-ray obtained. Additional obtain basic laboratory analysis.  ET tube moved 1 cm deeper after chest x-ray. Vent settings were changed based on blood gas. Patient with mild hypokalemia mild elevation in CK. CT head did not reveal any significant intra-abdominal abnormality's. No significant laboratory abnormalities resulted at time of transfer of care.  Patient will be admitted to ICU for further management of drug overdose complicated by agitated delirium. Patient stable at time of transfer of care.  Attending has seen and elevated patient and Dr. Erma Heritage is in agreement with plan.  Final Clinical Impressions(s) / ED Diagnoses   Final diagnoses:  Diacetylmorphine overdose, undetermined intent, initial encounter  Delirium  Agitation  Tachycardia    New Prescriptions New Prescriptions   No medications on file     Stacy Gardner, MD 07/18/16 1958    Shaune Pollack, MD 07/19/16 1216

## 2016-07-18 NOTE — ED Notes (Signed)
Pt being transported to CT 2 by RT, [place] RN and EMT.

## 2016-07-19 ENCOUNTER — Inpatient Hospital Stay (HOSPITAL_COMMUNITY): Payer: Self-pay

## 2016-07-19 DIAGNOSIS — T50901D Poisoning by unspecified drugs, medicaments and biological substances, accidental (unintentional), subsequent encounter: Secondary | ICD-10-CM

## 2016-07-19 DIAGNOSIS — R410 Disorientation, unspecified: Secondary | ICD-10-CM

## 2016-07-19 DIAGNOSIS — R000 Tachycardia, unspecified: Secondary | ICD-10-CM

## 2016-07-19 LAB — MAGNESIUM: MAGNESIUM: 1.7 mg/dL (ref 1.7–2.4)

## 2016-07-19 LAB — CBC
HCT: 40.7 % (ref 39.0–52.0)
HEMOGLOBIN: 13.5 g/dL (ref 13.0–17.0)
MCH: 30.1 pg (ref 26.0–34.0)
MCHC: 33.2 g/dL (ref 30.0–36.0)
MCV: 90.6 fL (ref 78.0–100.0)
PLATELETS: 237 10*3/uL (ref 150–400)
RBC: 4.49 MIL/uL (ref 4.22–5.81)
RDW: 14.1 % (ref 11.5–15.5)
WBC: 12.1 10*3/uL — ABNORMAL HIGH (ref 4.0–10.5)

## 2016-07-19 LAB — BASIC METABOLIC PANEL
ANION GAP: 9 (ref 5–15)
BUN: 8 mg/dL (ref 6–20)
CHLORIDE: 114 mmol/L — AB (ref 101–111)
CO2: 21 mmol/L — ABNORMAL LOW (ref 22–32)
Calcium: 8.5 mg/dL — ABNORMAL LOW (ref 8.9–10.3)
Creatinine, Ser: 0.95 mg/dL (ref 0.61–1.24)
Glucose, Bld: 86 mg/dL (ref 65–99)
POTASSIUM: 3.7 mmol/L (ref 3.5–5.1)
SODIUM: 144 mmol/L (ref 135–145)

## 2016-07-19 LAB — PHOSPHORUS: Phosphorus: 3.9 mg/dL (ref 2.5–4.6)

## 2016-07-19 MED ORDER — CHLORDIAZEPOXIDE HCL 5 MG PO CAPS
10.0000 mg | ORAL_CAPSULE | Freq: Two times a day (BID) | ORAL | Status: DC
  Administered 2016-07-19 – 2016-07-20 (×3): 10 mg via ORAL
  Filled 2016-07-19 (×3): qty 2

## 2016-07-19 MED ORDER — ACETAMINOPHEN 325 MG PO TABS
650.0000 mg | ORAL_TABLET | Freq: Four times a day (QID) | ORAL | Status: DC | PRN
  Administered 2016-07-19 – 2016-07-20 (×2): 650 mg via ORAL
  Filled 2016-07-19 (×2): qty 2

## 2016-07-19 MED ORDER — OXYCODONE-ACETAMINOPHEN 5-325 MG PO TABS
1.0000 | ORAL_TABLET | Freq: Four times a day (QID) | ORAL | Status: DC | PRN
  Administered 2016-07-19 (×2): 1 via ORAL
  Filled 2016-07-19 (×2): qty 1

## 2016-07-19 NOTE — Progress Notes (Signed)
Suicide screen completed and documented. Patient denies thoughts of harming self and/or others and states he did not intentionally try to kill himself. Suicide precautions not indicated at this time. Canary Brim, NP made aware. Patient states that he has been using heroin for 4 years. He did a 14 day rehab last year and relapsed the day he got out. Discussed with patient the need for rehab and that a social worker consult is ordered. Patient did not say yes or no to interest in rehab at this time. Nursing to continue to monitor.

## 2016-07-19 NOTE — Discharge Summary (Signed)
Physician Discharge Summary  Grant Tapia  ZOX:[id number]  DOB: 11/23/1985  DOA: 07/15/2016 PCP: No PCP Per Patient  Admit date: 07/15/2016 Discharge date: 07/19/2016  Admitted From: Home Disposition:  Home  Recommendations for Outpatient Follow-up:  1. Follow up with PCP in 1-2 weeks 2. Follow up on the Methadone clinic on 07/20/16 @ 5 am  3. Complete Librium taper   Discharge Condition: Stable   CODE STATUS: FULL  Diet recommendation: Heart Healthy    Brief/Interim Summary: Grant Carsonis a 30 y.o.malewith medical history significant of alcohol, heroin, methamphetamine, nicotine abuse who was sent to the ER by his girlfriend due to agitation. He was admitted from [facility] to [facility] due to acute metabolic encephalopathy from possible drug withdrawal. Patient was treated with ativan and librium protocol. Subsequently his mental status return to baseline. Then he was evaluated by psychiatrist due to hallucination and ? Suicidal ideas, psychiatry deemed him safe and stating that he was not suicidal or a threat to others. Patient was medically and psychiatry cleared for discharge. . Patient interested on detox, social worker has set an appointment with methadone clinic on Monday 07/20/16. Will d/c on Librium taper as patient report that his not going to drink anymore. Patient not withdrawing from opioids and has been asymptomatic and has had no narcotic during hospital stay.    Discharge Diagnoses/Hospital Course:  Acute metabolic encephalopathy - Drug intoxication vs withdrawal - resolved  Drug screen positive for Cocaine, Opiates and Methamphetamines  Initially on IV ativan PRN - Transitioned to Librium taper  Continue clonidine for elevated BP and tachycardia - avoid BB - taper off   Alcohol abuse - there was concern for alcohol withdrawals last drink 2 days PTA - CIWA done by me at the day of discharge 0 Will discharge on Librium taper with outpatient follow up Side effects  and risk discussed when mixing librium with alcohol. Patient verbalizes understanding and make some notes about the medical prescription Advised to go AA meetings  Polysubstance abuse  Heroin - withdrawals or intoxication less likely - normal size pupils, no abdominal pain, or piloerection  Methamphetamine - could be withdrawing given initial presentation - treat symptomatically  Cocaine abuse  Patient interested on detox program - he will benefit from Suboxone as outpatient  SW set appointment for the Methadone clinic on 07/20/16 at 5 am   Depression with initial suicide ideas - he denies to me Psych cleared patient for discharge to follow up as outpatient   AKI - due to dehydration - resolved with IVF    All other chronic medical condition were stable during the hospitalization.  On the day of the discharge the patient's vitals were stable, and no other acute medical condition were reported by patient. Patient was felt safe to be discharged to home  Discharge Instructions  You were cared for by a hospitalist during your hospital stay. If you have any questions about your discharge medications or the care you received while you were in the hospital after you are discharged, you can call the unit and asked to speak with the hospitalist on call if the hospitalist that took care of you is not available. Once you are discharged, your primary care physician will handle any further medical issues. Please note that NO REFILLS for any discharge medications will be authorized once you are discharged, as it is imperative that you return to your primary care physician (or establish a relationship with a primary care physician if you do  not have one) for your aftercare needs so that they can reassess your need for medications and monitor your lab values.  Discharge Instructions    Call MD for:  difficulty breathing, headache or visual disturbances    Complete by:  As directed    Call MD for:  extreme  fatigue    Complete by:  As directed    Call MD for:  hives    Complete by:  As directed    Call MD for:  persistant dizziness or light-headedness    Complete by:  As directed    Call MD for:  persistant nausea and vomiting    Complete by:  As directed    Call MD for:  redness, tenderness, or signs of infection (pain, swelling, redness, odor or green/yellow discharge around incision site)    Complete by:  As directed    Call MD for:  severe uncontrolled pain    Complete by:  As directed    Call MD for:  temperature >100.4    Complete by:  As directed    Diet - low sodium heart healthy    Complete by:  As directed    Increase activity slowly    Complete by:  As directed      Allergies as of 07/17/2016   No Known Allergies     Medication List    TAKE these medications   cloNIDine 0.1 MG tablet Commonly known as:  CATAPRES Take 1 tablet (0.1 mg total) by mouth daily.   folic acid 1 MG tablet Commonly known as:  FOLVITE Take 1 tablet (1 mg total) by mouth daily.      Follow-up Information    [facility] on 07/20/2016.   Why:  5:00am arrive for intake appointment.  Contact information: [address]  [place], [place] [phone] Walk-In by Morley Kos  Monday-Friday with photo ID and Medicaid card          No Known Allergies  Consultations: Psychiatry - BH   Procedures/Studies: Ct Head Wo Contrast  Result Date: 07/18/2016 CLINICAL DATA:  Overdose, unresponsive EXAM: CT HEAD WITHOUT CONTRAST TECHNIQUE: Contiguous axial images were obtained from the base of the skull through the vertex without intravenous contrast. COMPARISON:  MR brain dated 03/27/2014 FINDINGS: Brain: No evidence of acute infarction, hemorrhage, hydrocephalus, extra-axial collection or mass lesion/mass effect. Vascular: No hyperdense vessel or unexpected calcification. Skull: Normal. Negative for fracture or focal lesion. Sinuses/Orbits: Mild partial opacification the right maxillary sinus.  Visualized paranasal sinuses and mastoid air cells are otherwise clear. Other: None. IMPRESSION: Normal head CT. Electronically Signed   By: Charline Bills M.D.   On: 07/18/2016 18:43   Dg Chest Port 1 View  Result Date: 07/18/2016 CLINICAL DATA:  Endotracheal tube and enteric tube placement. EXAM: PORTABLE CHEST 1 VIEW COMPARISON:  03/28/2014 FINDINGS: Endotracheal tube has tip 5.1 cm above the carina. Enteric tube courses into the stomach and off the inferior portion of the film as tip is not visualized. Lungs are adequately inflated without effusion or pneumothorax. Possible mild opacification over the right apex. Cardiomediastinal silhouette and remainder of the exam is unchanged. IMPRESSION: Possible mild opacification over the right apex. Tubes and lines as described. Electronically Signed   By: Elberta Fortis M.D.   On: 07/18/2016 18:28      Discharge Exam: Vitals:   07/17/16 0604 07/17/16 1157  BP: 113/80 125/72  Pulse: 77 97  Resp:    Temp: 98.7 F (37.1 C) 99.6 F (37.6  C)   Vitals:   07/16/16 1407 07/16/16 2040 07/17/16 0604 07/17/16 1157  BP: (!) 148/100 140/86 113/80 125/72  Pulse: 80 86 77 97  Resp: 18 20    Temp: 97.3 F (36.3 C) 98.4 F (36.9 C) 98.7 F (37.1 C) 99.6 F (37.6 C)  TempSrc: Oral Oral Axillary Oral  SpO2: 100% 100% 100% 98%  Weight:      Height:        General: Pt is alert, awake, not in acute distress Cardiovascular: RRR, S1/S2 +, no rubs, no gallops Respiratory: CTA bilaterally, no wheezing, no rhonchi Abdominal: Soft, NT, ND, bowel sounds + Extremities: no edema, no cyanosis    The results of significant diagnostics from this hospitalization (including imaging, microbiology, ancillary and laboratory) are listed below for reference.     Microbiology: Recent Results (from the past 240 hour(s))  Urine culture     Status: None   Collection Time: 07/15/16  6:35 AM  Result Value Ref Range Status   Specimen Description URINE, CLEAN CATCH   Final   Special Requests NONE  Final   Culture   Final    NO GROWTH Performed at [facility], [address], [place], [place] [address]    Report Status 07/16/2016 FINAL  Final  Culture, blood (Routine X 2) w Reflex to ID Panel     Status: None (Preliminary result)   Collection Time: 07/15/16  3:00 PM  Result Value Ref Range Status   Specimen Description BLOOD LEFT ANTECUBITAL  Final   Special Requests IN PEDIATRIC BOTTLE Blood Culture adequate volume  Final   Culture   Final    NO GROWTH 4 DAYS Performed at [facility], [address], [place], [place] [address]    Report Status PENDING  Incomplete  Culture, blood (Routine X 2) w Reflex to ID Panel     Status: None (Preliminary result)   Collection Time: 07/15/16  3:10 PM  Result Value Ref Range Status   Specimen Description BLOOD LEFT ARM  Final   Special Requests IN PEDIATRIC BOTTLE Blood Culture adequate volume  Final   Culture   Final    NO GROWTH 4 DAYS Performed at [facility], [address], [place], [place] [address]    Report Status PENDING  Incomplete  MRSA PCR Screening     Status: None   Collection Time: 07/18/16  8:58 PM  Result Value Ref Range Status   MRSA by PCR NEGATIVE NEGATIVE Final    Comment:        The GeneXpert MRSA Assay (FDA approved for NASAL specimens only), is one component of a comprehensive MRSA colonization surveillance program. It is not intended to diagnose MRSA infection nor to guide or monitor treatment for MRSA infections.      Labs: BNP (last 3 results) No results for input(s): BNP in the last 8760 hours. Basic Metabolic Panel:  Recent Labs Lab 07/15/16 0428 07/15/16 1413 07/15/16 2254 07/18/16 1813 07/18/16 1825 07/19/16 0310  NA 137  --  137 140 144 144  K 3.9  --  4.4 3.0* 3.0* 3.7  CL 101  --  105 108 107 114*  CO2 22  --  26 21*  --  21*  GLUCOSE 137*  --  117* 197* 191* 86  BUN 18  --  CREATININE 2.18* 1.20 1.01 1.20 1.00 0.95   CALCIUM 10.4*  --  9.0 8.6*  --  8.5*  MG  --   --   --   --   --  1.7  PHOS  --   --   --   --   --  3.9   Liver Function Tests:  Recent Labs Lab 07/15/16 0428 07/15/16 2254 07/18/16 1813  AST 102* 78* 68*  ALT 130* 104* 80*  ALKPHOS 100 77 74  BILITOT 1.5* 1.4* 0.7  PROT 8.5* 7.4 6.8  ALBUMIN 4.6 4.2 3.7   No results for input(s): LIPASE, AMYLASE in the last 168 hours. No results for input(s): AMMONIA in the last 168 hours. CBC:  Recent Labs Lab 07/15/16 0428 07/15/16 1413 07/18/16 1813 07/18/16 1825 07/19/16 0310  WBC 11.6* 7.4 12.5*  --  12.1*  NEUTROABS 8.2*  --  9.8*  --   --   HGB 16.4 14.1 14.6 15.3 13.5  HCT 45.6 40.9 42.6 45.0 40.7  MCV 87.4 86.8 90.1  --  90.6  PLT 292 213 326  --  237   Cardiac Enzymes:  Recent Labs Lab 07/18/16 1813  CKTOTAL 653*  TROPONINI <0.03   BNP: Invalid input(s): POCBNP CBG:  Recent Labs Lab 07/18/16 2015  GLUCAP 88   D-Dimer No results for input(s): DDIMER in the last 72 hours. Hgb A1c No results for input(s): HGBA1C in the last 72 hours. Lipid Profile No results for input(s): CHOL, HDL, LDLCALC, TRIG, CHOLHDL, LDLDIRECT in the last 72 hours. Thyroid function studies No results for input(s): TSH, T4TOTAL, T3FREE, THYROIDAB in the last 72 hours.  Invalid input(s): FREET3 Anemia work up No results for input(s): VITAMINB12, FOLATE, FERRITIN, TIBC, IRON, RETICCTPCT in the last 72 hours. Urinalysis    Component Value Date/Time   COLORURINE YELLOW 07/18/2016 2109   APPEARANCEUR HAZY (A) 07/18/2016 2109   LABSPEC 1.012 07/18/2016 2109   PHURINE 5.0 07/18/2016 2109   GLUCOSEU NEGATIVE 07/18/2016 2109   HGBUR SMALL (A) 07/18/2016 2109   BILIRUBINUR NEGATIVE 07/18/2016 2109   KETONESUR NEGATIVE 07/18/2016 2109   PROTEINUR NEGATIVE 07/18/2016 2109   UROBILINOGEN 0.2 03/26/2014 2255   NITRITE NEGATIVE 07/18/2016 2109   LEUKOCYTESUR NEGATIVE 07/18/2016 2109   Sepsis Labs Invalid input(s): PROCALCITONIN,   WBC,  LACTICIDVEN Microbiology Recent Results (from the past 240 hour(s))  Urine culture     Status: None   Collection Time: 07/15/16  6:35 AM  Result Value Ref Range Status   Specimen Description URINE, CLEAN CATCH  Final   Special Requests NONE  Final   Culture   Final    NO GROWTH Performed at [facility], [address], [place], [place] [address]    Report Status 07/16/2016 FINAL  Final  Culture, blood (Routine X 2) w Reflex to ID Panel     Status: None (Preliminary result)   Collection Time: 07/15/16  3:00 PM  Result Value Ref Range Status   Specimen Description BLOOD LEFT ANTECUBITAL  Final   Special Requests IN PEDIATRIC BOTTLE Blood Culture adequate volume  Final   Culture   Final    NO GROWTH 4 DAYS Performed at [facility], [address] [place], [place] [address]    Report Status PENDING  Incomplete  Culture, blood (Routine X 2) w Reflex to ID Panel     Status: None (Preliminary result)   Collection Time: 07/15/16  3:10 PM  Result Value Ref Range Status   Specimen Description BLOOD LEFT ARM  Final   Special Requests IN PEDIATRIC BOTTLE Blood Culture adequate  volume  Final   Culture   Final    NO GROWTH 4 DAYS Performed at [facility], [address] [place], [place] [address]    Report Status PENDING  Incomplete  MRSA PCR Screening     Status: None   Collection Time: 07/18/16  8:58 PM  Result Value Ref Range Status   MRSA by PCR NEGATIVE NEGATIVE Final    Comment:        The GeneXpert MRSA Assay (FDA approved for NASAL specimens only), is one component of a comprehensive MRSA colonization surveillance program. It is not intended to diagnose MRSA infection nor to guide or monitor treatment for MRSA infections.     Time coordinating discharge: 35 minutes  SIGNED:  Latrelle Dodrill, MD  [facility] 07/19/2016, 6:35 PM  Pager please text page via  [email or web address] Password [email or web address]

## 2016-07-19 NOTE — Progress Notes (Signed)
Pt. Self extubated himself. RN placed pt. On NRB. Pt. sats currently 100%. Pt. Is alert and responsive. RN notified the box. No issues at this time, will continue to monitor pt.

## 2016-07-19 NOTE — Progress Notes (Signed)
PULMONARY / CRITICAL CARE MEDICINE   Name: Grant Tapia MRN: [id number] DOB: 1985/10/31    ADMISSION DATE:  07/18/2016 CONSULTATION DATE:  07/18/16  REFERRING MD:  Dr. Stacy Gardner  CHIEF COMPLAINT:  Heroin overdose  BRIEF SUMMARY:   Grant Tapia is a 31 y.o. male with on-going polysubstance abuse of alcohol, heroin, nicotine and methamphetamines who was admitted on 07/15/16 to [facility] hospital for agitation from drug abuse.  He was discharged yesterday evening with librium taper and outpatient follow up in an outpatient substance abuse program. He presented this evening via EMS after he was found unconscious in a local [employer] with a needle and a bag of heroin by him. He was spontaneously breathing after receiving narcan on the field but became combative and agitated requiring  haldol and  versed. He was intubated in the trauma bay for airway protection and placed on propofol infusion. PCCM was consulted for admission.    SUBJECTIVE:  RN reports pt self extubated early am (~0400).  No acute events.  Given fentanyl for headache.  Pt reports being thirsty.     VITAL SIGNS: BP 132/72   Pulse 87   Temp 99.3 F (37.4 C)   Resp 17   Ht  (1.803 m)   Wt 181 lb 11.2 oz (82.4 kg)   SpO2 100%   BMI 25.34 kg/m   HEMODYNAMICS:    VENTILATOR SETTINGS: Vent Mode: PRVC FiO2 (%):  [30 %-100 %] 30 % Set Rate:  [15 bmp-18 bmp] 18 bmp Vt Set:  [450 mL-600 mL] 450 mL PEEP:  [5 cmH20] 5 cmH20 Plateau Pressure:  [13 cmH20-16 cmH20] 13 cmH20  INTAKE / OUTPUT: I/O last 3 completed shifts: In: 3288.5 [I.V.:1288.5; IV Piggyback:2000] Out: 735 [Urine:735]  PHYSICAL EXAMINATION: General:  Well developed adult male in NAD, lying in bed HEENT: MM pink/moist Neuro: AAOx4, drowsy but awakens CV: s1s2 rrr, no m/r/g PULM: even/non-labored, lungs bilaterally clear, moist cough WU:JWJX, non-tender, bsx4 active  Extremities: warm/dry, no edema  Skin: no rashes or  lesions   LABS:  BMET  Recent Labs Lab 07/15/16 2254 07/18/16 1813 07/18/16 1825 07/19/16 0310  NA 137 140 144 144  K 4.4 3.0* 3.0* 3.7  CL 105 108 107 114*  CO2 26 21*  --  21*  BUN CREATININE 1.01 1.20 1.00 0.95  GLUCOSE 117* 197* 191* 86    Electrolytes  Recent Labs Lab 07/15/16 2254 07/18/16 1813 07/19/16 0310  CALCIUM 9.0 8.6* 8.5*  MG  --   --  1.7  PHOS  --   --  3.9    CBC  Recent Labs Lab 07/15/16 1413 07/18/16 1813 07/18/16 1825 07/19/16 0310  WBC 7.4 12.5*  --  12.1*  HGB 14.1 14.6 15.3 13.5  HCT 40.9 42.6 45.0 40.7  PLT 213 326  --  237    Coag's No results for input(s): APTT, INR in the last 168 hours.  Sepsis Markers No results for input(s): LATICACIDVEN, PROCALCITON, O2SATVEN in the last 168 hours.  ABG  Recent Labs Lab 07/18/16 1854  PHART 7.300*  PCO2ART 50.4*  PO2ART 461.0*    Liver Enzymes  Recent Labs Lab 07/15/16 0428 07/15/16 2254 07/18/16 1813  AST 102* 78* 68*  ALT 130* 104* 80*  ALKPHOS 100 77 74  BILITOT 1.5* 1.4* 0.7  ALBUMIN 4.6 4.2 3.7    Cardiac Enzymes  Recent Labs Lab 07/18/16 1813  TROPONINI <0.03    Glucose  Recent Labs Lab  07/18/16 2015  GLUCAP 88    Imaging Ct Head Wo Contrast  Result Date: 07/18/2016 CLINICAL DATA:  Overdose, unresponsive EXAM: CT HEAD WITHOUT CONTRAST TECHNIQUE: Contiguous axial images were obtained from the base of the skull through the vertex without intravenous contrast. COMPARISON:  MR brain dated 03/27/2014 FINDINGS: Brain: No evidence of acute infarction, hemorrhage, hydrocephalus, extra-axial collection or mass lesion/mass effect. Vascular: No hyperdense vessel or unexpected calcification. Skull: Normal. Negative for fracture or focal lesion. Sinuses/Orbits: Mild partial opacification the right maxillary sinus. Visualized paranasal sinuses and mastoid air cells are otherwise clear. Other: None. IMPRESSION: Normal head CT. Electronically Signed   By:  Charline Bills M.D.   On: 07/18/2016 18:43   Dg Chest Port 1 View  Result Date: 07/18/2016 CLINICAL DATA:  Endotracheal tube and enteric tube placement. EXAM: PORTABLE CHEST 1 VIEW COMPARISON:  03/28/2014 FINDINGS: Endotracheal tube has tip 5.1 cm above the carina. Enteric tube courses into the stomach and off the inferior portion of the film as tip is not visualized. Lungs are adequately inflated without effusion or pneumothorax. Possible mild opacification over the right apex. Cardiomediastinal silhouette and remainder of the exam is unchanged. IMPRESSION: Possible mild opacification over the right apex. Tubes and lines as described. Electronically Signed   By: Elberta Fortis M.D.   On: 07/18/2016 18:28    DISCUSSION: Grant Tapia is a 31 y.o. male with on-going polysubstance abuse of alcohol, heroin, nicotine and methamphetamines readmitted after discharge on 07/17/16 with suspected heroin overdose resulting in intubation and mechanical ventilation. CT Head negative for acute bleed or fracture. CXR has RUL opacity which may be result of aspiration. He was intubated for airway protection due to combativeness requiring large amounts of sedating medications. Self extubated am 4/8.    ASSESSMENT / PLAN:  PULMONARY A: Intubated for airway protection on mechanical ventilation - self extubated am 4/8 RUL Opacity, R/O Aspiration P:   Repeat CXR to follow up on RUL opacity  Pulmonary hygiene - IS, mobilize  Continue oral care  CARDIOVASCULAR A:  Hypertensive - improved P:  Continue home clonidine   RENAL A:   Creatinine 1.0, likely reflecting mild CKD, unchanged from yesterday Hypokalemia P:   Trend BMP / urinary output Replace electrolytes as indicated Avoid nephrotoxic agents, ensure adequate renal perfusion  GASTROINTESTINAL A:   At Risk Aspiration  P:   Begin clear liquid diet, advance as tolerated  Pepcid BID   HEMATOLOGIC A:   Leukocytosis likely reactive stress  response P:  Trend CBC  Enoxaparin + SCD's for DVT prophylaxis   INFECTIOUS A:   RUL Opacity - possible aspiration  P:   Monitor for evidence of infection  Trend WBC / fever curve  Monitor off abx  ENDOCRINE A:   No issues P:   Monitor glucose on BMP  NEUROLOGIC A:   Acute encephalopathy from drug overdose - suspect heroin P:   D/C IV sedation / vent orders Continue librium  Will need SW to offer possible inpatient therapy / detox  FAMILY  - Updates: Patient and girlfriend updated at bedside  - Inter-disciplinary family meet or Palliative Care meeting due by:  07/25/16  - Global:  Anticipate he will be able to transfer out of ICU this afternoon.    CC Time: 30 minutes.   Canary Brim, NP-C [facility] Pgr: [phone] or if no answer [phone] 07/19/2016, 8:37 AM    ATTENDING NOTE / ATTESTATION NOTE :   I have discussed the  case with the resident/APP  Canary Brim NP  I agree with the resident/APP's  history, physical examination, assessment, and plans.    I have edited the above note and modified it according to our agreed history, physical examination, assessment and plan.   Patient admitted with confusion related to drug overdose. He was intubated for airway protection but self extubated early morning. He remains stable, sleepy, arousable. Continue Librium but at a lower dose at 10 mg twice a day. Advance diet as tolerated. Transfer out to stepdown unit. If he remains stable the next 24 hours, anticipate he can be transferred to regular floor.    Family :Family updated at length today.  TRH to pick up in am, PCCM off on 4/9   J. Alexis Frock, MD 07/19/2016, 8:36 PM [facility] Pager [phone] After 3 pm or if no answer, call [phone]

## 2016-07-20 ENCOUNTER — Inpatient Hospital Stay (HOSPITAL_COMMUNITY): Payer: Self-pay

## 2016-07-20 DIAGNOSIS — R451 Restlessness and agitation: Secondary | ICD-10-CM

## 2016-07-20 LAB — BASIC METABOLIC PANEL
ANION GAP: 9 (ref 5–15)
BUN: 7 mg/dL (ref 6–20)
CHLORIDE: 109 mmol/L (ref 101–111)
CO2: 23 mmol/L (ref 22–32)
Calcium: 8.7 mg/dL — ABNORMAL LOW (ref 8.9–10.3)
Creatinine, Ser: 1.03 mg/dL (ref 0.61–1.24)
GFR calc non Af Amer: >60 mL/min (ref >60)
GLUCOSE: 116 mg/dL — AB (ref 65–99)
POTASSIUM: 3.5 mmol/L (ref 3.5–5.1)
Sodium: 141 mmol/L (ref 135–145)

## 2016-07-20 LAB — CBC
HEMATOCRIT: 41.8 % (ref 39.0–52.0)
HEMOGLOBIN: 14 g/dL (ref 13.0–17.0)
MCH: 30.3 pg (ref 26.0–34.0)
MCHC: 33.5 g/dL (ref 30.0–36.0)
MCV: 90.5 fL (ref 78.0–100.0)
Platelets: 254 10*3/uL (ref 150–400)
RBC: 4.62 MIL/uL (ref 4.22–5.81)
RDW: 14.2 % (ref 11.5–15.5)
WBC: 8.2 10*3/uL (ref 4.0–10.5)

## 2016-07-20 LAB — HEPATIC FUNCTION PANEL
ALBUMIN: 3.1 g/dL — AB (ref 3.5–5.0)
ALT: 86 U/L — AB (ref 17–63)
AST: 171 U/L — AB (ref 15–41)
Alkaline Phosphatase: 61 U/L (ref 38–126)
Bilirubin, Direct: 0.2 mg/dL (ref 0.1–0.5)
Indirect Bilirubin: 0.1 mg/dL — ABNORMAL LOW (ref 0.3–0.9)
Total Bilirubin: 0.3 mg/dL (ref 0.3–1.2)
Total Protein: 6.1 g/dL — ABNORMAL LOW (ref 6.5–8.1)

## 2016-07-20 MED ORDER — VITAMIN B-1 100 MG PO TABS: 100.0000 mg | ORAL_TABLET | Freq: Every day | ORAL | 0 refills | Status: DC

## 2016-07-20 MED ORDER — NALOXONE HCL 4 MG/0.1ML NA LIQD: 1.0000 | Freq: Once | NASAL | 0 refills | Status: AC

## 2016-07-20 MED ORDER — CHLORDIAZEPOXIDE HCL 5 MG PO CAPS: ORAL_CAPSULE | ORAL | 0 refills | Status: DC

## 2016-07-20 NOTE — Discharge Instructions (Signed)
Drug Overdose  A drug overdose happens when you take too much of a drug. An overdose can occur with illegal drugs, prescription drugs, or over-the-counter (OTC) drugs.  The effects of a drug overdose can be mild, dangerous, or even deadly.  What are the causes?  This condition may be caused by:  · Taking too much of a drug by accident.  · Taking too much of a drug on purpose.  · An error made by a health care provider who prescribes a drug.  · An error made by the pharmacist who fills the prescription order.    Drugs that commonly cause overdose include:  · Mental health drugs.  · Pain medicines.  · Illegal drugs.  · OTC cough and cold medicines.  · Heart medicines.  · Seizure medicines.    What increases the risk?  A drug overdose is more likely in:  · Children. They may be attracted to colorful pills. Because of a child's small size, even a small amount of a drug can be dangerous.  · Elderly people. They may be taking many different drugs. Elderly people may have difficulty reading labels or remembering when they last took their medicine.    The risk of a drug overdose is also higher for someone who:  · Takes illegal drugs.  · Takes a drug and drinks alcohol.  · Has a mental health condition.    What are the signs or symptoms?  Symptoms of a drug overdose depend on the drug and the amount that was taken. Common danger signs include:  · Behavior changes.  · Sleepiness.  · Slowed breathing.  · Nausea and vomiting.  · Seizures.  · Changes in eye pupil size. The pupil may be very large or very small.    If there are signs and symptoms of very low blood pressure (shock) from an overdose, emergency treatment is required. These include:  · Cold and clammy skin.  · Pale skin.  · Blue lips.  · Very slow breathing.  · Extreme sleepiness.  · Loss of consciousness.    How is this diagnosed?  This condition may be diagnosed based on your symptoms. It is important to tell your health care provider:  · All of the drugs that you  took.  · When you took the drugs.  · Whether you were drinking alcohol.    Your health care provider will do a physical exam. This exam may include:  · Checking and monitoring your heart rate and rhythm, your temperature, and your blood pressure (vital signs).  · Checking your breathing and oxygen level.    You may also have tests, including:  · Urine tests to check for drugs in your system.  · Blood tests to check for:  ? Drugs in your system.  ? Signs of an imbalance of your blood minerals (electrolytes).  ? Liver damage.  ? Kidney damage.    How is this treated?  Supporting your vital signs and your breathing is the first step in treating a drug overdose. Treatment may also include:  · Giving fluids and electrolytes through an IV tube.  · Inserting a breathing tube (endotracheal tube) in your airway to help you breathe.  · Passing a tube through your nose and into your stomach (NG tube, or nasogastric tube) to wash out your stomach.  · Giving medicines that:  ? Make you vomit.  ? Absorb any medicine that is left in your digestive system.  ? Block   or reverse the effect of the drug that caused the overdose.  · Filtering your blood through an artificial kidney machine (hemodialysis). You may need this if your overdose is severe or if you have kidney failure.  · Ongoing counseling and mental health support if you intentionally overdosed or used an illegal drug.    Follow these instructions at home:  · Take medicines only as directed by your health care provider. Always ask your health care provider about possible side effects of any new drug that you start taking.  · Keep a list of all of the drugs that you take, including over-the-counter medicines. Bring this list with you to all of your medical visits.  · Drink enough fluid to keep your urine clear or pale yellow.  · Keep all follow-up visits as directed by your health care provider. This is important.  How is this prevented?  · Get help if you are struggling  with:  ? Alcohol or drug use.  ? Depression or another mental health problem.  · Keep the phone number of your local poison control center near your phone or on your cell phone.  · Store all medicines in safety containers that are out of the reach of children.  · Read the drug inserts that come with your medicines.  · Do not use illegal drugs.  · Do not drink alcohol when taking drugs.  · Do not take medicines that are not prescribed for you.  Contact a health care provider if:  · Your symptoms return.  · You develop new symptoms or side effects when you take medicines.  Get help right away if:  · You think that you or someone else may have taken too much of a drug. The hotline of the National Poison Control Center is (800) 222-1222.  · You or someone else is having symptoms of a drug overdose.  · You have serious thoughts about hurting yourself or others.  · You become confused.  · You have:  ? Chest pain.  ? Difficulty breathing.  ? A loss of consciousness.  Drug overdose is an emergency. Do not wait to see if the symptoms will go away. Get medical help right away. Call your local emergency services (911 in the [place] Do not drive yourself to the hospital.  This information is not intended to replace advice given to you by your health care provider. Make sure you discuss any questions you have with your health care provider.  Document Released: 08/14/2014 Document Revised: 08/09/2015 Document Reviewed: 04/04/2014  Elsevier Interactive Patient Education © 2017 [employer]

## 2016-07-20 NOTE — Progress Notes (Signed)
CSW received consult regarding substance use. CSW met with patient's parents at bedside. CSW explained that hospital is not able to hold patient past discharge for inpatient drug rehab placement. CSW provided inpatient substance use resources and provided CSW contact info in case the facilities need any info.   CSW signing off.  Percell Locus Rosali Augello LCSWA [phone]

## 2016-07-20 NOTE — Care Management Note (Signed)
Case Management Note  Patient Details  Name: Grant Tapia MRN: [id number] Date of Birth: 1986/03/27  Subjective/Objective:       CM following for progression and d/c planning.              Action/Plan: 07/20/2016 Notified by pt RN that pt parents are now with pt and wish to meet with CSW re rehab for this pt . CSW, [place] notified. Osborne Casco will provide info to parents if pt gives permission.   Expected Discharge Date:                  Expected Discharge Plan:  Home/Self Care  In-House Referral:  Clinical Social Work  Discharge planning Services  NA  Post Acute Care Choice:  NA Choice offered to:  NA  DME Arranged:    DME Agency:     HH Arranged:    HH Agency:     Status of Service:  In process, will continue to follow  If discussed at Long Length of Stay Meetings, dates discussed:    Additional Comments:  Starlyn Skeans, RN 07/20/2016, 11:09 AM

## 2016-07-20 NOTE — Discharge Summary (Signed)
Physician Discharge Summary  Grant Tapia UXN:[id number] DOB: [birth year] DOA: 07/18/2016  PCP: No PCP Per Patient  Admit date: 07/18/2016 Discharge date: 07/20/2016  Admitted From: home  Disposition:  Home with family   Recommendations for Outpatient Follow-up:  1. Family attempting to find more immediate inpatient rehabilitation program 2. Continue librium taper 3. Strongly encourage going to methadone clinic pending enrollment in rehab program  Home Health:  none  Equipment/Devices:  None  Discharge Condition:  Stable, improved CODE STATUS:  Full code  Diet recommendation:  regular   Brief/Interim Summary:  Grant Tapia is a 31 y.o. male with on-going polysubstance abuse of alcohol, heroin, nicotine and methamphetamines who was admitted from 07/15/16 through 4/6 to [facility] for agitation from drug abuse.  He was discharged with librium taper and outpatient follow up in an outpatient substance abuse program however, within 24 hours he overdosed on heroin.  He was found unconscious in a local [employer] with a needle and a bag of heroin by him and was transported by EMS to the ER. He was spontaneously breathing after receiving narcan on the field but became combative and agitated requiring '5mg'$  haldol and '15mg'$  versed.  He was intubated in the trauma bay for airway protection and placed on propofol infusion. PCCM was consulted for admission.  He self extubated overnight and was less agitated. He has been able to eat, drink, and ambulate. He has been stable on room air with stable vital signs for the last 24 hours.  He has met with the case manager and social worker and the family is attempting to enroll him in inpatient rehab as soon as they can find a opening.    Discharge Diagnoses:  Active Problems:   Overdose   Delirium   Tachycardia  Unintentional drug overdose with heroin requiring narcan and intubation for airway protection.   -  Self extubated am 4/8 -  Patient states he  is currently contemplative regarding abstinence and willing to voluntarily commit himself to a drug rehab facility -  Continue clonidine  -  Continue librium taper  RUL Opacity likely atelectasis due to drug overdose.  Patient remains afebrile and without leukocytosis.  He is breathing comfortably on room air.  -  Blood cultures NGTD  Elevated blood pressures -  Continue clonidine   Hypokalemia, resolved with IV potassium  Leukocytosis likely reactive stress response and resolved with IVF  Acute encephalopathy from drug overdose - suspect heroin, resolved  Discharge Instructions  Discharge Instructions    Diet general    Complete by:  As directed    Increase activity slowly    Complete by:  As directed        Medication List    TAKE these medications   chlordiazePOXIDE 5 MG capsule Commonly known as:  LIBRIUM Take 2 tabs twice daily x 1 day, 1 tab twice daily x 1 day, then one tab daily x 2 days, then stop   cloNIDine 0.1 MG tablet Commonly known as:  CATAPRES Take 1 tablet (0.1 mg total) by mouth daily.   folic acid 1 MG tablet Commonly known as:  FOLVITE Take 1 tablet (1 mg total) by mouth daily.   naloxone 4 MG/0.1ML Liqd nasal spray kit Commonly known as:  NARCAN Place 1 spray into the nose once. May repeat in 3-5 minutes if still not breathing normally   thiamine 100 MG tablet Commonly known as:  VITAMIN B-1 Take 1 tablet (100 mg total) by mouth daily.  No Known Allergies  Consultations: PCCM   Procedures/Studies: Ct Head Wo Contrast  Result Date: 07/18/2016 CLINICAL DATA:  Overdose, unresponsive EXAM: CT HEAD WITHOUT CONTRAST TECHNIQUE: Contiguous axial images were obtained from the base of the skull through the vertex without intravenous contrast. COMPARISON:  MR brain dated 03/27/2014 FINDINGS: Brain: No evidence of acute infarction, hemorrhage, hydrocephalus, extra-axial collection or mass lesion/mass effect. Vascular: No hyperdense vessel  or unexpected calcification. Skull: Normal. Negative for fracture or focal lesion. Sinuses/Orbits: Mild partial opacification the right maxillary sinus. Visualized paranasal sinuses and mastoid air cells are otherwise clear. Other: None. IMPRESSION: Normal head CT. Electronically Signed   By: Charline Bills M.D.   On: 07/18/2016 18:43   Dg Chest Port 1 View  Result Date: 07/20/2016 CLINICAL DATA:  Heroin overdose EXAM: PORTABLE CHEST 1 VIEW COMPARISON:  July 18, 2016 FINDINGS: Endotracheal tube is been removed. Nasogastric tube has been removed. No pneumothorax. There is no edema or consolidation. Heart is upper normal in size with pulmonary vascularity within normal limits. No adenopathy. No bone lesions. IMPRESSION: No edema or consolidation. Electronically Signed   By: Bretta Bang III M.D.   On: 07/20/2016 07:31   Dg Chest Port 1 View  Result Date: 07/18/2016 CLINICAL DATA:  Endotracheal tube and enteric tube placement. EXAM: PORTABLE CHEST 1 VIEW COMPARISON:  03/28/2014 FINDINGS: Endotracheal tube has tip 5.1 cm above the carina. Enteric tube courses into the stomach and off the inferior portion of the film as tip is not visualized. Lungs are adequately inflated without effusion or pneumothorax. Possible mild opacification over the right apex. Cardiomediastinal silhouette and remainder of the exam is unchanged. IMPRESSION: Possible mild opacification over the right apex. Tubes and lines as described. Electronically Signed   By: Elberta Fortis M.D.   On: 07/18/2016 18:28     Subjective: Feeling well.  Denies pain, anxiety, jitters/shakes.  Eating and drinking well.  1 BM since admission.    Discharge Exam: Vitals:   07/20/16 1000 07/20/16 1108  BP: 139/80 (!) 149/81  Pulse: 88   Resp: 18   Temp: 99 F (37.2 C)    Vitals:   07/19/16 2249 07/20/16 0615 07/20/16 1000 07/20/16 1108  BP: 128/77 128/70 139/80 (!) 149/81  Pulse:  92 88   Resp:  18 18   Temp: 99.1 F (37.3 C) 98.9 F  (37.2 C) 99 F (37.2 C)   TempSrc: Oral  Oral   SpO2: 100% 97% 98%   Weight:  82 kg (180 lb 12.4 oz)    Height:        General: Pt is alert, awake, not in acute distress Cardiovascular: RRR, S1/S2 +, no rubs, no gallops Respiratory: CTA bilaterally, no wheezing, no rhonchi Abdominal: Soft, NT, ND, bowel sounds + Extremities: no edema, no cyanosis Skin:  Scars/track marks on right upper extremity    The results of significant diagnostics from this hospitalization (including imaging, microbiology, ancillary and laboratory) are listed below for reference.     Microbiology: Recent Results (from the past 240 hour(s))  Urine culture     Status: None   Collection Time: 07/15/16  6:35 AM  Result Value Ref Range Status   Specimen Description URINE, CLEAN CATCH  Final   Special Requests NONE  Final   Culture   Final    NO GROWTH Performed at [facility], [address] [place], [place] [address]    Report Status 07/16/2016 FINAL  Final  Culture, blood (Routine X 2)  w Reflex to ID Panel     Status: None (Preliminary result)   Collection Time: 07/15/16  3:00 PM  Result Value Ref Range Status   Specimen Description BLOOD LEFT ANTECUBITAL  Final   Special Requests IN PEDIATRIC BOTTLE Blood Culture adequate volume  Final   Culture   Final    NO GROWTH 4 DAYS Performed at [facility], [place] [address] [place], [place] [address]    Report Status PENDING  Incomplete  Culture, blood (Routine X 2) w Reflex to ID Panel     Status: None (Preliminary result)   Collection Time: 07/15/16  3:10 PM  Result Value Ref Range Status   Specimen Description BLOOD LEFT ARM  Final   Special Requests IN PEDIATRIC BOTTLE Blood Culture adequate volume  Final   Culture   Final    NO GROWTH 4 DAYS Performed at [facility], [place] [address], [place], [place] [address]    Report Status PENDING  Incomplete  MRSA PCR Screening     Status: None   Collection Time: 07/18/16  8:58 PM   Result Value Ref Range Status   MRSA by PCR NEGATIVE NEGATIVE Final    Comment:        The GeneXpert MRSA Assay (FDA approved for NASAL specimens only), is one component of a comprehensive MRSA colonization surveillance program. It is not intended to diagnose MRSA infection nor to guide or monitor treatment for MRSA infections.      Labs: BNP (last 3 results) No results for input(s): BNP in the last 8760 hours. Basic Metabolic Panel:  Recent Labs Lab 07/15/16 0428  07/15/16 2254 07/18/16 1813 07/18/16 1825 07/19/16 0310 07/20/16 0707  NA 137  --  137 140 144 144 141  K 3.9  --  4.4 3.0* 3.0* 3.7 3.5  CL 101  --  105 108 107 114* 109  CO2 22  --  26 21*  --  21* 23  GLUCOSE 137*  --  117* 197* 191* 86 116*  BUN 18  --  '12 9 10 8 7  '$ CREATININE 2.18*  < > 1.01 1.20 1.00 0.95 1.03  CALCIUM 10.4*  --  9.0 8.6*  --  8.5* 8.7*  MG  --   --   --   --   --  1.7  --   PHOS  --   --   --   --   --  3.9  --   < > = values in this interval not displayed. Liver Function Tests:  Recent Labs Lab 07/15/16 0428 07/15/16 2254 07/18/16 1813 07/20/16 0707  AST 102* 78* 68* 171*  ALT 130* 104* 80* 86*  ALKPHOS 100 77 74 61  BILITOT 1.5* 1.4* 0.7 0.3  PROT 8.5* 7.4 6.8 6.1*  ALBUMIN 4.6 4.2 3.7 3.1*   No results for input(s): LIPASE, AMYLASE in the last 168 hours. No results for input(s): AMMONIA in the last 168 hours. CBC:  Recent Labs Lab 07/15/16 0428 07/15/16 1413 07/18/16 1813 07/18/16 1825 07/19/16 0310 07/20/16 0707  WBC 11.6* 7.4 12.5*  --  12.1* 8.2  NEUTROABS 8.2*  --  9.8*  --   --   --   HGB 16.4 14.1 14.6 15.3 13.5 14.0  HCT 45.6 40.9 42.6 45.0 40.7 41.8  MCV 87.4 86.8 90.1  --  90.6 90.5  PLT 292 213 326  --  237 254   Cardiac Enzymes:  Recent Labs Lab 07/18/16 1813  CKTOTAL [place] <0.03   BNP: Invalid input(s): POCBNP CBG:  Recent Labs Lab 07/18/16 2015  GLUCAP 88   D-Dimer No results for input(s): DDIMER in the last 72  hours. Hgb A1c No results for input(s): HGBA1C in the last 72 hours. Lipid Profile No results for input(s): CHOL, HDL, LDLCALC, TRIG, CHOLHDL, LDLDIRECT in the last 72 hours. Thyroid function studies No results for input(s): TSH, T4TOTAL, T3FREE, THYROIDAB in the last 72 hours.  Invalid input(s): FREET3 Anemia work up No results for input(s): VITAMINB12, FOLATE, FERRITIN, TIBC, IRON, RETICCTPCT in the last 72 hours. Urinalysis    Component Value Date/Time   COLORURINE YELLOW 07/18/2016 2109   APPEARANCEUR HAZY (A) 07/18/2016 2109   LABSPEC 1.012 07/18/2016 2109   PHURINE 5.0 07/18/2016 2109   GLUCOSEU NEGATIVE 07/18/2016 2109   HGBUR SMALL (A) 07/18/2016 2109   BILIRUBINUR NEGATIVE 07/18/2016 2109   KETONESUR NEGATIVE 07/18/2016 2109   PROTEINUR NEGATIVE 07/18/2016 2109   UROBILINOGEN 0.2 03/26/2014 2255   NITRITE NEGATIVE 07/18/2016 2109   LEUKOCYTESUR NEGATIVE 07/18/2016 2109   Sepsis Labs Invalid input(s): PROCALCITONIN,  WBC,  LACTICIDVEN   Time coordinating discharge: Over 30 minutes  SIGNED:   Janece Canterbury, MD  [facility] 07/20/2016, 2:54 PM Pager   If 7PM-7AM, please contact night-coverage [email or web address] Password [email or web address]

## 2016-07-21 LAB — CULTURE, BLOOD (ROUTINE X 2)
CULTURE: NO GROWTH
Culture: NO GROWTH
Special Requests: ADEQUATE
Special Requests: ADEQUATE

## 2016-07-24 LAB — URINE DRUGS OF ABUSE SCREEN W ALC, ROUTINE (REF LAB)
BARBITURATE, UR: NEGATIVE ng/mL
CANNABINOID QUANT UR: NEGATIVE ng/mL
COCAINE (METAB.): NEGATIVE ng/mL
ETHANOL U, QUAN: NEGATIVE %
Methadone Screen, Urine: NEGATIVE ng/mL
Opiate Quant, Ur: NEGATIVE ng/mL
Phencyclidine, Ur: NEGATIVE ng/mL
Propoxyphene, Urine: NEGATIVE ng/mL

## 2016-07-24 LAB — AMPHETAMINE CONF, UR
Amphetamine, Ur: UNDETERMINED
Amphetamines: UNDETERMINED
Methamphetamine, Ur: UNDETERMINED

## 2016-07-24 LAB — DRUG PROFILE 799031: BENZODIAZEPINES: NEGATIVE

## 2016-07-24 NOTE — Progress Notes (Signed)
Documenting upon request from lab: Got a telephone call this morning from Dennison Mascot works in [employer] and wanted me document regarding Urine drug screen which was not completed on this patient due to not enough urine.

## 2016-11-18 ENCOUNTER — Encounter (HOSPITAL_BASED_OUTPATIENT_CLINIC_OR_DEPARTMENT_OTHER): Payer: Self-pay | Admitting: Emergency Medicine

## 2016-11-18 DIAGNOSIS — L03114 Cellulitis of left upper limb: Secondary | ICD-10-CM | POA: Diagnosis present

## 2016-11-18 DIAGNOSIS — F1010 Alcohol abuse, uncomplicated: Secondary | ICD-10-CM | POA: Diagnosis present

## 2016-11-18 DIAGNOSIS — L910 Hypertrophic scar: Secondary | ICD-10-CM | POA: Diagnosis present

## 2016-11-18 DIAGNOSIS — B961 Klebsiella pneumoniae [K. pneumoniae] as the cause of diseases classified elsewhere: Secondary | ICD-10-CM | POA: Diagnosis present

## 2016-11-18 DIAGNOSIS — L02414 Cutaneous abscess of left upper limb: Principal | ICD-10-CM | POA: Diagnosis present

## 2016-11-18 DIAGNOSIS — F1110 Opioid abuse, uncomplicated: Secondary | ICD-10-CM | POA: Diagnosis present

## 2016-11-18 DIAGNOSIS — I10 Essential (primary) hypertension: Secondary | ICD-10-CM | POA: Diagnosis present

## 2016-11-18 DIAGNOSIS — R21 Rash and other nonspecific skin eruption: Secondary | ICD-10-CM | POA: Diagnosis present

## 2016-11-18 DIAGNOSIS — F1910 Other psychoactive substance abuse, uncomplicated: Secondary | ICD-10-CM | POA: Diagnosis present

## 2016-11-18 NOTE — ED Triage Notes (Signed)
Pt has swelling and redness to left elbow.

## 2016-11-19 ENCOUNTER — Encounter (HOSPITAL_COMMUNITY)
Admission: EM | Disposition: A | Discharge status: Home / Self Care | Payer: Self-pay | Source: Home / Self Care | Attending: Internal Medicine

## 2016-11-19 ENCOUNTER — Emergency Department (HOSPITAL_BASED_OUTPATIENT_CLINIC_OR_DEPARTMENT_OTHER): Payer: Self-pay

## 2016-11-19 ENCOUNTER — Inpatient Hospital Stay (HOSPITAL_COMMUNITY): Payer: Self-pay | Admitting: Anesthesiology

## 2016-11-19 ENCOUNTER — Inpatient Hospital Stay (HOSPITAL_BASED_OUTPATIENT_CLINIC_OR_DEPARTMENT_OTHER)
Admission: EM | Admit: 2016-11-19 | Discharge: 2016-11-23 | DRG: 581 | Disposition: A | Discharge status: Home / Self Care | Payer: Self-pay | Attending: Internal Medicine | Admitting: Internal Medicine

## 2016-11-19 ENCOUNTER — Encounter (HOSPITAL_BASED_OUTPATIENT_CLINIC_OR_DEPARTMENT_OTHER): Payer: Self-pay | Admitting: Emergency Medicine

## 2016-11-19 DIAGNOSIS — L0211 Cutaneous abscess of neck: Secondary | ICD-10-CM

## 2016-11-19 DIAGNOSIS — L0390 Cellulitis, unspecified: Secondary | ICD-10-CM

## 2016-11-19 DIAGNOSIS — F1990 Other psychoactive substance use, unspecified, uncomplicated: Secondary | ICD-10-CM | POA: Diagnosis present

## 2016-11-19 DIAGNOSIS — L0291 Cutaneous abscess, unspecified: Secondary | ICD-10-CM

## 2016-11-19 HISTORY — DX: Other psychoactive substance use, unspecified, uncomplicated: F19.90

## 2016-11-19 HISTORY — PX: I & D EXTREMITY: SHX5045

## 2016-11-19 LAB — CBC WITH DIFFERENTIAL/PLATELET
BASOS ABS: 0 10*3/uL (ref 0.0–0.1)
Basophils Relative: 0 %
EOS PCT: 2 %
Eosinophils Absolute: 0.3 10*3/uL (ref 0.0–0.7)
HEMATOCRIT: 40.1 % (ref 39.0–52.0)
Hemoglobin: 13.7 g/dL (ref 13.0–17.0)
LYMPHS ABS: 2.6 10*3/uL (ref 0.7–4.0)
Lymphocytes Relative: 20 %
MCH: 30.6 pg (ref 26.0–34.0)
MCHC: 34.2 g/dL (ref 30.0–36.0)
MCV: 89.5 fL (ref 78.0–100.0)
MONO ABS: 1.1 10*3/uL — AB (ref 0.1–1.0)
Monocytes Relative: 9 %
NEUTROS ABS: 8.9 10*3/uL — AB (ref 1.7–7.7)
Neutrophils Relative %: 69 %
Platelets: 294 10*3/uL (ref 150–400)
RBC: 4.48 MIL/uL (ref 4.22–5.81)
RDW: 12.8 % (ref 11.5–15.5)
WBC: 12.9 10*3/uL — AB (ref 4.0–10.5)

## 2016-11-19 LAB — BASIC METABOLIC PANEL
ANION GAP: 10 (ref 5–15)
BUN: 8 mg/dL (ref 6–20)
CO2: 27 mmol/L (ref 22–32)
Calcium: 8.9 mg/dL (ref 8.9–10.3)
Chloride: 100 mmol/L — ABNORMAL LOW (ref 101–111)
Creatinine, Ser: 0.75 mg/dL (ref 0.61–1.24)
GFR calc Af Amer: >60 mL/min (ref >60)
GFR calc non Af Amer: >60 mL/min (ref >60)
GLUCOSE: 115 mg/dL — AB (ref 65–99)
POTASSIUM: 3.7 mmol/L (ref 3.5–5.1)
Sodium: 137 mmol/L (ref 135–145)

## 2016-11-19 LAB — SURGICAL PCR SCREEN
MRSA, PCR: NEGATIVE
STAPHYLOCOCCUS AUREUS: POSITIVE — AB

## 2016-11-19 SURGERY — IRRIGATION AND DEBRIDEMENT EXTREMITY
Anesthesia: General | Site: Elbow | Laterality: Left

## 2016-11-19 MED ORDER — SODIUM CHLORIDE 0.9 % IV SOLN
INTRAVENOUS | Status: DC
  Administered 2016-11-19 – 2016-11-23 (×12): via INTRAVENOUS

## 2016-11-19 MED ORDER — FENTANYL CITRATE (PF) 250 MCG/5ML IJ SOLN
INTRAMUSCULAR | Status: AC
  Filled 2016-11-19: qty 5

## 2016-11-19 MED ORDER — IBUPROFEN 200 MG PO TABS
400.0000 mg | ORAL_TABLET | Freq: Four times a day (QID) | ORAL | Status: DC | PRN
Start: 2016-11-19 — End: 2016-11-19

## 2016-11-19 MED ORDER — THIAMINE HCL 100 MG/ML IJ SOLN: 100.0000 mg | Freq: Once | INTRAMUSCULAR | Status: DC

## 2016-11-19 MED ORDER — PROPOFOL 10 MG/ML IV BOLUS
INTRAVENOUS | Status: AC
  Filled 2016-11-19: qty 40

## 2016-11-19 MED ORDER — ACETAMINOPHEN 325 MG PO TABS: 650.0000 mg | ORAL_TABLET | Freq: Four times a day (QID) | ORAL | Status: DC | PRN

## 2016-11-19 MED ORDER — HYDROMORPHONE HCL 1 MG/ML IJ SOLN
INTRAMUSCULAR | Status: AC
  Administered 2016-11-19: 0.5 mg via INTRAVENOUS
  Filled 2016-11-19: qty 1

## 2016-11-19 MED ORDER — TRAZODONE HCL 50 MG PO TABS
50.0000 mg | ORAL_TABLET | Freq: Every evening | ORAL | Status: DC | PRN
  Administered 2016-11-20 – 2016-11-23 (×2): 50 mg via ORAL
  Filled 2016-11-19 (×3): qty 1

## 2016-11-19 MED ORDER — SODIUM CHLORIDE 0.9 % IR SOLN
Status: DC | PRN
  Administered 2016-11-19: 3000 mL

## 2016-11-19 MED ORDER — FENTANYL CITRATE (PF) 100 MCG/2ML IJ SOLN
INTRAMUSCULAR | Status: DC | PRN
  Administered 2016-11-19: 50 ug via INTRAVENOUS
  Administered 2016-11-19: 200 ug via INTRAVENOUS
  Administered 2016-11-19: 50 ug via INTRAVENOUS

## 2016-11-19 MED ORDER — MIDAZOLAM HCL 2 MG/2ML IJ SOLN
INTRAMUSCULAR | Status: AC
  Filled 2016-11-19: qty 2

## 2016-11-19 MED ORDER — HYDROMORPHONE HCL 1 MG/ML IJ SOLN
0.2500 mg | INTRAMUSCULAR | Status: DC | PRN
  Administered 2016-11-19 (×4): 0.5 mg via INTRAVENOUS

## 2016-11-19 MED ORDER — LIDOCAINE 2% (20 MG/ML) 5 ML SYRINGE
INTRAMUSCULAR | Status: DC | PRN
  Administered 2016-11-19: 100 mg via INTRAVENOUS

## 2016-11-19 MED ORDER — DIPHENHYDRAMINE HCL 25 MG PO CAPS
25.0000 mg | ORAL_CAPSULE | Freq: Four times a day (QID) | ORAL | Status: DC | PRN
  Filled 2016-11-19: qty 1

## 2016-11-19 MED ORDER — MIDAZOLAM HCL 2 MG/2ML IJ SOLN
INTRAMUSCULAR | Status: DC | PRN
  Administered 2016-11-19 (×2): 2 mg via INTRAVENOUS

## 2016-11-19 MED ORDER — ONDANSETRON 4 MG PO TBDP: 4.0000 mg | ORAL_TABLET | Freq: Four times a day (QID) | ORAL | Status: AC | PRN

## 2016-11-19 MED ORDER — VITAMIN B-1 100 MG PO TABS
100.0000 mg | ORAL_TABLET | Freq: Every day | ORAL | Status: DC
  Administered 2016-11-20 – 2016-11-23 (×4): 100 mg via ORAL
  Filled 2016-11-19 (×5): qty 1

## 2016-11-19 MED ORDER — VANCOMYCIN HCL IN DEXTROSE 1-5 GM/200ML-% IV SOLN
1000.0000 mg | Freq: Once | INTRAVENOUS | Status: AC
  Administered 2016-11-19: 1000 mg via INTRAVENOUS
  Filled 2016-11-19: qty 200

## 2016-11-19 MED ORDER — PROPOFOL 10 MG/ML IV BOLUS
INTRAVENOUS | Status: DC | PRN
  Administered 2016-11-19: 200 mg via INTRAVENOUS

## 2016-11-19 MED ORDER — HYDROMORPHONE HCL 1 MG/ML IJ SOLN: 1.0000 mg | INTRAMUSCULAR | Status: DC | PRN

## 2016-11-19 MED ORDER — PIPERACILLIN-TAZOBACTAM 3.375 G IVPB
3.3750 g | Freq: Three times a day (TID) | INTRAVENOUS | Status: DC
  Administered 2016-11-19 – 2016-11-20 (×5): 3.375 g via INTRAVENOUS
  Filled 2016-11-19 (×5): qty 50

## 2016-11-19 MED ORDER — ONDANSETRON HCL 4 MG/2ML IJ SOLN
INTRAMUSCULAR | Status: DC | PRN
  Administered 2016-11-19: 4 mg via INTRAVENOUS

## 2016-11-19 MED ORDER — PIPERACILLIN-TAZOBACTAM 3.375 G IVPB 30 MIN
3.3750 g | Freq: Once | INTRAVENOUS | Status: AC
  Administered 2016-11-19: 3.375 g via INTRAVENOUS
  Filled 2016-11-19 (×2): qty 50

## 2016-11-19 MED ORDER — ACETAMINOPHEN 650 MG RE SUPP: 650.0000 mg | Freq: Four times a day (QID) | RECTAL | Status: DC | PRN

## 2016-11-19 MED ORDER — MEPERIDINE HCL 25 MG/ML IJ SOLN
INTRAMUSCULAR | Status: AC
  Administered 2016-11-19: 12.5 mg via INTRAVENOUS
  Filled 2016-11-19: qty 1

## 2016-11-19 MED ORDER — ONDANSETRON HCL 4 MG/2ML IJ SOLN
INTRAMUSCULAR | Status: AC
  Filled 2016-11-19: qty 2

## 2016-11-19 MED ORDER — ONDANSETRON HCL 4 MG/2ML IJ SOLN: 4.0000 mg | Freq: Three times a day (TID) | INTRAMUSCULAR | Status: DC | PRN

## 2016-11-19 MED ORDER — PROMETHAZINE HCL 25 MG/ML IJ SOLN: 6.2500 mg | INTRAMUSCULAR | Status: DC | PRN

## 2016-11-19 MED ORDER — CHLORDIAZEPOXIDE HCL 25 MG PO CAPS: 25.0000 mg | ORAL_CAPSULE | Freq: Four times a day (QID) | ORAL | Status: AC | PRN

## 2016-11-19 MED ORDER — LIDOCAINE 2% (20 MG/ML) 5 ML SYRINGE
INTRAMUSCULAR | Status: AC
  Filled 2016-11-19: qty 5

## 2016-11-19 MED ORDER — HYDROXYZINE HCL 25 MG PO TABS
25.0000 mg | ORAL_TABLET | Freq: Four times a day (QID) | ORAL | Status: AC | PRN
  Administered 2016-11-19 – 2016-11-20 (×2): 25 mg via ORAL
  Filled 2016-11-19 (×2): qty 1

## 2016-11-19 MED ORDER — LACTATED RINGERS IV SOLN
INTRAVENOUS | Status: DC
  Administered 2016-11-19: 19:00:00 via INTRAVENOUS

## 2016-11-19 MED ORDER — LOPERAMIDE HCL 2 MG PO CAPS: 2.0000 mg | ORAL_CAPSULE | ORAL | Status: AC | PRN

## 2016-11-19 MED ORDER — KETOROLAC TROMETHAMINE 15 MG/ML IJ SOLN
15.0000 mg | Freq: Four times a day (QID) | INTRAMUSCULAR | Status: DC | PRN
  Administered 2016-11-19 – 2016-11-23 (×14): 15 mg via INTRAVENOUS
  Filled 2016-11-19 (×14): qty 1

## 2016-11-19 MED ORDER — MEPERIDINE HCL 25 MG/ML IJ SOLN
6.2500 mg | INTRAMUSCULAR | Status: DC | PRN
  Administered 2016-11-19: 12.5 mg via INTRAVENOUS

## 2016-11-19 MED ORDER — KETOROLAC TROMETHAMINE 30 MG/ML IJ SOLN
15.0000 mg | Freq: Once | INTRAMUSCULAR | Status: AC
  Administered 2016-11-19: 15 mg via INTRAVENOUS
  Filled 2016-11-19: qty 1

## 2016-11-19 MED ORDER — ADULT MULTIVITAMIN W/MINERALS CH
1.0000 | ORAL_TABLET | Freq: Every day | ORAL | Status: DC
  Administered 2016-11-19 – 2016-11-23 (×5): 1 via ORAL
  Filled 2016-11-19 (×4): qty 1

## 2016-11-19 MED ORDER — 0.9 % SODIUM CHLORIDE (POUR BTL) OPTIME
TOPICAL | Status: DC | PRN
  Administered 2016-11-19: 1000 mL

## 2016-11-19 MED ORDER — VANCOMYCIN HCL IN DEXTROSE 1-5 GM/200ML-% IV SOLN
1000.0000 mg | Freq: Three times a day (TID) | INTRAVENOUS | Status: DC
  Administered 2016-11-19 – 2016-11-22 (×9): 1000 mg via INTRAVENOUS
  Filled 2016-11-19 (×9): qty 200

## 2016-11-19 MED ORDER — HYDROCODONE-ACETAMINOPHEN 10-325 MG PO TABS
1.0000 | ORAL_TABLET | ORAL | Status: DC | PRN
  Administered 2016-11-19 – 2016-11-20 (×2): 2 via ORAL
  Filled 2016-11-19 (×2): qty 2

## 2016-11-19 MED ORDER — HYDROMORPHONE HCL-NACL 0.5-0.9 MG/ML-% IV SOSY: 1.0000 mg | PREFILLED_SYRINGE | INTRAVENOUS | Status: DC | PRN

## 2016-11-19 MED ORDER — ADULT MULTIVITAMIN W/MINERALS CH
1.0000 | ORAL_TABLET | Freq: Every day | ORAL | Status: DC
  Filled 2016-11-19: qty 1

## 2016-11-19 SURGICAL SUPPLY — 46 items
BANDAGE ELASTIC 3 VELCRO ST LF (GAUZE/BANDAGES/DRESSINGS) ×3 IMPLANT
BANDAGE ELASTIC 4 VELCRO ST LF (GAUZE/BANDAGES/DRESSINGS) ×3 IMPLANT
BNDG COHESIVE 4X5 TAN STRL (GAUZE/BANDAGES/DRESSINGS) ×3 IMPLANT
BNDG GAUZE ELAST 4 BULKY (GAUZE/BANDAGES/DRESSINGS) ×3 IMPLANT
BNDG GAUZE STRTCH 6 (GAUZE/BANDAGES/DRESSINGS) ×9 IMPLANT
BRUSH SCRUB SURG 4.25 DISP (MISCELLANEOUS) ×6 IMPLANT
COVER SURGICAL LIGHT HANDLE (MISCELLANEOUS) ×6 IMPLANT
DRAPE U-SHAPE 47X51 STRL (DRAPES) ×3 IMPLANT
DRSG ADAPTIC 3X8 NADH LF (GAUZE/BANDAGES/DRESSINGS) ×3 IMPLANT
ELECT REM PT RETURN 9FT ADLT (ELECTROSURGICAL)
ELECTRODE REM PT RTRN 9FT ADLT (ELECTROSURGICAL) IMPLANT
GAUZE IODOFORM PACK 1/2 7832 (GAUZE/BANDAGES/DRESSINGS) ×3 IMPLANT
GAUZE SPONGE 4X4 12PLY STRL (GAUZE/BANDAGES/DRESSINGS) ×3 IMPLANT
GAUZE SPONGE 4X4 12PLY STRL LF (GAUZE/BANDAGES/DRESSINGS) ×3 IMPLANT
GLOVE BIO SURGEON STRL SZ7.5 (GLOVE) IMPLANT
GLOVE BIO SURGEON STRL SZ8 (GLOVE) ×3 IMPLANT
GLOVE BIOGEL PI IND STRL 7.5 (GLOVE) IMPLANT
GLOVE BIOGEL PI IND STRL 8 (GLOVE) ×1 IMPLANT
GLOVE BIOGEL PI INDICATOR 7.5 (GLOVE)
GLOVE BIOGEL PI INDICATOR 8 (GLOVE) ×2
GOWN STRL REUS W/ TWL LRG LVL3 (GOWN DISPOSABLE) ×2 IMPLANT
GOWN STRL REUS W/ TWL XL LVL3 (GOWN DISPOSABLE) ×1 IMPLANT
GOWN STRL REUS W/TWL LRG LVL3 (GOWN DISPOSABLE) ×4
GOWN STRL REUS W/TWL XL LVL3 (GOWN DISPOSABLE) ×2
HANDPIECE INTERPULSE COAX TIP (DISPOSABLE)
KIT BASIN OR (CUSTOM PROCEDURE TRAY) ×3 IMPLANT
KIT ROOM TURNOVER OR (KITS) ×3 IMPLANT
MANIFOLD NEPTUNE II (INSTRUMENTS) IMPLANT
NS IRRIG 1000ML POUR BTL (IV SOLUTION) ×3 IMPLANT
PACK ORTHO EXTREMITY (CUSTOM PROCEDURE TRAY) ×3 IMPLANT
PAD ARMBOARD 7.5X6 YLW CONV (MISCELLANEOUS) ×6 IMPLANT
PADDING CAST COTTON 6X4 STRL (CAST SUPPLIES) ×3 IMPLANT
SET HNDPC FAN SPRY TIP SCT (DISPOSABLE) IMPLANT
SLING ARM FOAM STRAP LRG (SOFTGOODS) ×3 IMPLANT
SPONGE LAP 18X18 X RAY DECT (DISPOSABLE) ×3 IMPLANT
STOCKINETTE IMPERVIOUS 9X36 MD (GAUZE/BANDAGES/DRESSINGS) ×3 IMPLANT
SUT PDS AB 2-0 CT1 27 (SUTURE) IMPLANT
SWAB COLLECTION DEVICE MRSA (MISCELLANEOUS) ×3 IMPLANT
SWAB CULTURE ESWAB REG 1ML (MISCELLANEOUS) ×3 IMPLANT
TOWEL OR 17X24 6PK STRL BLUE (TOWEL DISPOSABLE) ×3 IMPLANT
TOWEL OR 17X26 10 PK STRL BLUE (TOWEL DISPOSABLE) ×6 IMPLANT
TUBE CONNECTING 12'X1/4 (SUCTIONS) ×1
TUBE CONNECTING 12X1/4 (SUCTIONS) ×2 IMPLANT
UNDERPAD 30X30 (UNDERPADS AND DIAPERS) ×3 IMPLANT
WATER STERILE IRR 1000ML POUR (IV SOLUTION) ×3 IMPLANT
YANKAUER SUCT BULB TIP NO VENT (SUCTIONS) ×3 IMPLANT

## 2016-11-19 NOTE — ED Provider Notes (Signed)
[facility] Provider Note   CSN: [id number] Arrival date & time: 11/18/16  2337     History   Chief Complaint Chief Complaint  Patient presents with  . Joint Swelling    HPI Grant Tapia is a 31 y.o. male.  The history is provided by the patient.  Rash   This is a new problem. The current episode started more than 1 week ago. The problem has been gradually worsening. The problem is associated with nothing. There has been no fever. Affected Location: left elbow area. The pain is moderate. The pain has been constant since onset. Associated symptoms include itching and pain. He has tried nothing for the symptoms. The treatment provided no relief. Risk factors: IVDA.    Past Medical History:  Diagnosis Date  . Heroin abuse   . Hypertension   . IV drug user   . Overdose     Patient Active Problem List   Diagnosis Date Noted  . Cellulitis 11/19/2016  . IV drug user 11/19/2016  . Delirium   . Tachycardia   . Overdose 07/18/2016  . Drug withdrawal (HCC) 07/16/2016  . AKI (acute kidney injury) (HCC) 07/15/2016  . Alcohol abuse 07/15/2016  . Heroin abuse 07/15/2016  . Methamphetamine abuse 07/15/2016  . Cocaine abuse 07/15/2016  . Acute metabolic encephalopathy 07/15/2016  . Lactic acidosis 03/29/2014  . Sepsis (HCC) 03/27/2014  . Chest pain   . Headache   . Neck pain     History reviewed. No pertinent surgical history.     Home Medications    Prior to Admission medications   Not on File    Family History No family history on file.  Social History Social History  Substance Use Topics  . Smoking status: Never Smoker  . Smokeless tobacco: Never Used  . Alcohol use Yes     Allergies   Patient has no known allergies.   Review of Systems Review of Systems  Constitutional: Negative for fever.  Respiratory: Negative for wheezing and stridor.   Musculoskeletal: Positive for arthralgias.  Skin: Positive for color change, itching  and rash.  All other systems reviewed and are negative.    Physical Exam Updated Vital Signs BP 131/79 (BP Location: Right Arm)   Pulse (!) 104   Temp 99.8 F (37.7 C) (Oral)   Resp 16   Ht 5\' 11"  (1.803 m)   Wt 81.6 kg (180 lb)   SpO2 97%   BMI 25.10 kg/m   Physical Exam  Constitutional: He appears well-developed and well-nourished. No distress.  HENT:  Head: Normocephalic and atraumatic.  Mouth/Throat: Oropharynx is clear and moist. No oropharyngeal exudate.  Eyes: Pupils are equal, round, and reactive to light. Conjunctivae are normal.  Neck: Normal range of motion. Neck supple.  Cardiovascular: Normal rate, regular rhythm, normal heart sounds and intact distal pulses.   Pulmonary/Chest: Effort normal and breath sounds normal. No stridor. He has no wheezes. He has no rales.  Abdominal: Soft. Bowel sounds are normal. He exhibits no mass. There is no tenderness. There is no rebound and no guarding.  Lymphadenopathy:    He has no cervical adenopathy.  Skin: Capillary refill takes less than 2 seconds. There is erythema.     Psychiatric: He has a normal mood and affect.     ED Treatments / Results   Vitals:   11/19/16 0256 11/19/16 0332  BP: 131/79 (!) 137/91  Pulse: (!) 104 97  Resp: 16 16  Temp: 99.8 F (  37.7 C)     Labs (all labs ordered are listed, but only abnormal results are displayed)  Results for orders placed or performed during the hospital encounter of 11/19/16  CBC with Differential/Platelet  Result Value Ref Range   WBC 12.9 (H) 4.0 - 10.5 K/uL   RBC 4.48 4.22 - 5.81 MIL/uL   Hemoglobin 13.7 13.0 - 17.0 g/dL   HCT 44.0 34.7 - 42.5 %   MCV 89.5 78.0 - 100.0 fL   MCH 30.6 26.0 - 34.0 pg   MCHC 34.2 30.0 - 36.0 g/dL   RDW 95.6 38.7 - 56.4 %   Platelets 294 150 - 400 K/uL   Neutrophils Relative % 69 %   Neutro Abs 8.9 (H) 1.7 - 7.7 K/uL   Lymphocytes Relative 20 %   Lymphs Abs 2.6 0.7 - 4.0 K/uL   Monocytes Relative 9 %   Monocytes Absolute  1.1 (H) 0.1 - 1.0 K/uL   Eosinophils Relative 2 %   Eosinophils Absolute 0.3 0.0 - 0.7 K/uL   Basophils Relative 0 %   Basophils Absolute 0.0 0.0 - 0.1 K/uL  Basic metabolic panel  Result Value Ref Range   Sodium 137 135 - 145 mmol/L   Potassium 3.7 3.5 - 5.1 mmol/L   Chloride 100 (L) 101 - 111 mmol/L   CO2 27 22 - 32 mmol/L   Glucose, Bld 115 (H) 65 - 99 mg/dL   BUN 8 6 - 20 mg/dL   Creatinine, Ser 3.32 0.61 - 1.24 mg/dL   Calcium 8.9 8.9 - 95.1 mg/dL   GFR calc non Af Amer >60 >60 mL/min   GFR calc Af Amer >60 >60 mL/min   Anion gap 10 5 - 15   Dg Elbow Complete Left  Result Date: 11/19/2016 CLINICAL DATA:  IVDA patient with history of laceration and keloid formation along the lateral elbow x3 years ago presenting with erythema and swelling of the elbow. EXAM: LEFT ELBOW - COMPLETE 3+ VIEW COMPARISON:  None. FINDINGS: Soft tissue swelling about the left elbow more so along the medial and dorsal aspects. No soft tissue emphysema. No fracture, dislocation or bone destruction. No radiopaque foreign body is noted. IMPRESSION: Generalized soft tissue induration about the left elbow more so dorsomedially. No subcutaneous emphysema nor radiopaque foreign body. No acute osseous abnormality Electronically Signed   By: Tollie Eth M.D.   On: 11/19/2016 02:33    Radiology Dg Elbow Complete Left  Result Date: 11/19/2016 CLINICAL DATA:  IVDA patient with history of laceration and keloid formation along the lateral elbow x3 years ago presenting with erythema and swelling of the elbow. EXAM: LEFT ELBOW - COMPLETE 3+ VIEW COMPARISON:  None. FINDINGS: Soft tissue swelling about the left elbow more so along the medial and dorsal aspects. No soft tissue emphysema. No fracture, dislocation or bone destruction. No radiopaque foreign body is noted. IMPRESSION: Generalized soft tissue induration about the left elbow more so dorsomedially. No subcutaneous emphysema nor radiopaque foreign body. No acute osseous  abnormality Electronically Signed   By: Tollie Eth M.D.   On: 11/19/2016 02:33    Procedures Procedures (including critical care time)  Medications Ordered in ED Medications  0.9 %  sodium chloride infusion ( Intravenous New Bag/Given 11/19/16 0250)  vancomycin (VANCOCIN) IVPB 1000 mg/200 mL premix (0 mg Intravenous Stopped 11/19/16 0250)  piperacillin-tazobactam (ZOSYN) IVPB 3.375 g (0 g Intravenous Stopped 11/19/16 0227)  ketorolac (TORADOL) 30 MG/ML injection 15 mg (15 mg Intravenous Given 11/19/16 0251)  Final Clinical Impressions(s) / ED Diagnoses   Final diagnoses:  Cellulitis, unspecified cellulitis site   Will admit to medicine for IV abx.       Juhi Lagrange, MD 11/19/16 [phone]

## 2016-11-19 NOTE — ED Notes (Signed)
Pt admits to Dr. 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

## 2016-11-19 NOTE — Transfer of Care (Signed)
Immediate Anesthesia Transfer of Care Note  Patient: Grant Tapia  Procedure(s) Performed: Procedure(s): INCISION AND DRAINAGE LEFT ELBOW  ABSCESS (Left)  Patient Location: PACU  Anesthesia Type:General  Level of Consciousness: sedated  Airway & Oxygen Therapy: Patient Spontanous Breathing and Patient connected to face mask oxygen  Post-op Assessment: Report given to RN and Post -op Vital signs reviewed and stable  Post vital signs: Reviewed and stable  Last Vitals:  Vitals:   11/19/16 1341 11/19/16 1625  BP: 139/70 (!) 158/94  Pulse: 75 79  Resp: 20 20  Temp: 37.2 C 37.3 C  SpO2: 100% 100%    Last Pain:  Vitals:   11/19/16 1625  TempSrc: Oral  PainSc:       Patients Stated Pain Goal: 3 (11/19/16 0940)  Complications: No apparent anesthesia complications

## 2016-11-19 NOTE — Consult Note (Signed)
[facility] Consultation  Reason for Consult: Left antecubital abscess Referring Physician: Bonnell Public MD  Grant Tapia is an 31 y.o. male.  HPI: RHD polysubstance abuser who c/o increasing left elbow pain, redness, and swelling, now fever and chills. On Vanc and Zosyn. Cultures taken this morning on admission but not from abscess. Negative HIV in April.  Past Medical History:  Diagnosis Date  . Heroin abuse   . Hypertension   . IV drug user   . Overdose     History reviewed. No pertinent surgical history.  History reviewed. No pertinent family history.  Social History:  reports that he has never smoked. He has never used smokeless tobacco. He reports that he drinks alcohol. He reports that he uses drugs, including IV.  Allergies: No Known Allergies  Medications:  Anti-infectives    Start     Dose/Rate Route Frequency Ordered Stop   11/19/16 0800  [MAR Hold]  vancomycin (VANCOCIN) IVPB 1000 mg/200 mL premix     (MAR Hold since 11/19/16 1605)   1,000 mg 200 mL/hr over 60 Minutes Intravenous Every 8 hours 11/19/16 0310     11/19/16 0800  [MAR Hold]  piperacillin-tazobactam (ZOSYN) IVPB 3.375 g     (MAR Hold since 11/19/16 1605)   3.375 g 12.5 mL/hr over 240 Minutes Intravenous Every 8 hours 11/19/16 0310     11/19/16 0100  vancomycin (VANCOCIN) IVPB 1000 mg/200 mL premix     1,000 mg 200 mL/hr over 60 Minutes Intravenous  Once 11/19/16 0048 11/19/16 0250   11/19/16 0100  piperacillin-tazobactam (ZOSYN) IVPB 3.375 g     3.375 g 100 mL/hr over 30 Minutes Intravenous  Once 11/19/16 0048 11/19/16 0227      Results for orders placed or performed during the hospital encounter of 11/19/16 (from the past 48 hour(s))  CBC with Differential/Platelet     Status: Abnormal   Collection Time: 11/19/16  1:40 AM  Result Value Ref Range   WBC 12.9 (H) 4.0 - 10.5 K/uL   RBC 4.48 4.22 - 5.81 MIL/uL   Hemoglobin 13.7 13.0 - 17.0 g/dL   HCT 40.1 39.0 - 52.0  %   MCV 89.5 78.0 - 100.0 fL   MCH 30.6 26.0 - 34.0 pg   MCHC 34.2 30.0 - 36.0 g/dL   RDW 12.8 11.5 - 15.5 %   Platelets 294 150 - 400 K/uL   Neutrophils Relative % 69 %   Neutro Abs 8.9 (H) 1.7 - 7.7 K/uL   Lymphocytes Relative 20 %   Lymphs Abs 2.6 0.7 - 4.0 K/uL   Monocytes Relative 9 %   Monocytes Absolute 1.1 (H) 0.1 - 1.0 K/uL   Eosinophils Relative 2 %   Eosinophils Absolute 0.3 0.0 - 0.7 K/uL   Basophils Relative 0 %   Basophils Absolute 0.0 0.0 - 0.1 K/uL  Basic metabolic panel     Status: Abnormal   Collection Time: 11/19/16  1:40 AM  Result Value Ref Range   Sodium 137 135 - 145 mmol/L   Potassium 3.7 3.5 - 5.1 mmol/L   Chloride 100 (L) 101 - 111 mmol/L   CO2 27 22 - 32 mmol/L   Glucose, Bld 115 (H) 65 - 99 mg/dL   BUN 8 6 - 20 mg/dL   Creatinine, Ser 0.75 0.61 - 1.24 mg/dL   Calcium 8.9 8.9 - 10.3 mg/dL   GFR calc non Af Amer >60 >60 mL/min   GFR calc Af Amer >60 >60 mL/min  Comment: (NOTE) The eGFR has been calculated using the CKD EPI equation. This calculation has not been validated in all clinical situations. eGFR's persistently <60 mL/min signify possible Chronic Kidney Disease.    Anion gap 10 5 - 15    Dg Elbow Complete Left  Result Date: 11/19/2016 CLINICAL DATA:  IVDA patient with history of laceration and keloid formation along the lateral elbow x3 years ago presenting with erythema and swelling of the elbow. EXAM: LEFT ELBOW - COMPLETE 3+ VIEW COMPARISON:  None. FINDINGS: Soft tissue swelling about the left elbow more so along the medial and dorsal aspects. No soft tissue emphysema. No fracture, dislocation or bone destruction. No radiopaque foreign body is noted. IMPRESSION: Generalized soft tissue induration about the left elbow more so dorsomedially. No subcutaneous emphysema nor radiopaque foreign body. No acute osseous abnormality Electronically Signed   By: Ashley Royalty M.D.   On: 11/19/2016 02:33    ROS multiple as above Blood pressure 139/70,  pulse 75, temperature 99 F (37.2 C), temperature source Oral, resp. rate 20, height _0  (1.803 m), weight 78.1 kg (172 lb 3.2 oz), SpO2 100 %. Physical Exam NCAT Pleasant and cooperative RRR No wheezing LUEx shoulder, wrist, digits- no skin wounds, nontender, no instability, no blocks to motion  Elbow with well localized swelling, erythema, tenderness just lateral to antecubital fossa; large keloid lateral to abscess  Sens  Ax/R/M/U intact  Mot   Ax/ R/ PIN/ M/ AIN/ U intact  Rad 2+  Assessment/Plan: Antecubital abscess 1. Incision and drainage of left antecubital space; will obtain cultures 2. PT/OT for hydrotherapy 3. Medical service to adjust antibiotic regimen  [place], MD [facility] [phone] (p)  11/19/2016  4:20 PM

## 2016-11-19 NOTE — Progress Notes (Signed)
Pharmacy Antibiotic Note  Grant Tapia is a 31 y.o. male with h/o IVDU admitted on 11/19/2016 with cellulitis.  Pharmacy has been consulted for Vancomycin and Zosyn  Dosing.  Vancomycin 1 g IV given in ED at  0145  Plan: Vancomycin 1 g IV q8h Zosyn 3.375 g IV q8h   Height: 5\' 11"  (180.3 cm) Weight: 180 lb (81.6 kg) IBW/kg (Calculated) : 75.3  Temp (24hrs), Avg:99.8 F (37.7 C), Min:99.8 F (37.7 C), Max:99.8 F (37.7 C)   Recent Labs Lab 11/19/16 0140  WBC 12.9*  CREATININE 0.75    Estimated Creatinine Clearance: 146.4 mL/min (by C-G formula based on SCr of 0.75 mg/dL).    No Known Allergies   Grant Tapia, Grant Tapia 11/19/2016 3:08 AM

## 2016-11-19 NOTE — H&P (Signed)
History and Physical:    Grant Tapia   UJW:[id number] DOB: May 08, 1985 DOA: 11/19/2016  Referring MD/provider: Dr Antionette Char PCP: Patient, No Pcp Per   Patient coming from: Home  Chief Complaint: Left arm pain and swelling History of Present Illness:   Grant Tapia is an 31 y.o. male with past medical history significant for polysubstance abuse including alcohol, heroin, methamphetamines and cocaine use who used a vein in his left arm near keloid 3 weeks ago tissue. Subsequent to that he noted pain in that area for 2 days which resolved on its own. A week ago he noted swelling in that area which she ignored 4 days ago he started having pain in his left antecubital area and 2 days ago he started having fevers and chills and sweats. He presents to the ER for evaluation.  Of note patient past medical history is significant for self report ofendocarditis 2 years ago which was treated elsewhere and treatment here for peritonsillar abscess earlier this  Year.   ED Course:   In ED patient was noted to have a cellulitis and was started on Vanco and Zosyn as well as IV fluids and admitted for further evaluation and treatment. Blood cultures were drawn and are pending. ROS:   ROS review of systems is negative for chest pain, cough, shortness of breath, abdominal pain, nausea or vomiting, dysuria, syncope or presyncope, headache.  Past Medical History:   Past Medical History:  Diagnosis Date  . Heroin abuse   . Hypertension   . IV drug user   . Overdose     Past Surgical History:   History reviewed. No pertinent surgical history.  Social History:   Social History   Social History  . Marital status: Single    Spouse name: N/A  . Number of children: N/A  . Years of education: N/A   Occupational History  . Not on file.   Social History Main Topics  . Smoking status: Never Smoker  . Smokeless tobacco: Never Used  . Alcohol use Yes  . Drug use: Yes    Types:  IV     Comment: heroin  . Sexual activity: Yes   Other Topics Concern  . Not on file   Social History Narrative  . No narrative on file    Allergies   Patient has no known allergies.  Family history:   History reviewed. No pertinent family history.  Current Medications:   Prior to Admission medications   Not on File    Physical Exam:   Vitals:   11/18/16 2346 11/19/16 0256 11/19/16 0332 11/19/16 0425  BP:  131/79 (!) 137/91 132/68  Pulse:  (!) 104 97 100  Resp:  16 16 17   Temp:  99.8 F (37.7 C)  99.5 F (37.5 C)  TempSrc:  Oral  Oral  SpO2:  97% 99% 99%  Weight: 81.6 kg (180 lb)   78.1 kg (172 lb 3.2 oz)  Height: 5\' 11"  (1.803 m)   5\' 11"  (1.803 m)     Physical Exam: Blood pressure 132/68, pulse 100, temperature 99.5 F (37.5 C), temperature source Oral, resp. rate 17, height 5\' 11"  (1.803 m), weight 78.1 kg (172 lb 3.2 oz), SpO2 99 %. Gen: No acute distress. Head: Normocephalic, atraumatic. Eyes: Pupils equal, round and reactive to light. Extraocular movements intact.  Sclerae nonicteric. No lid lag. Mouth: Oropharynx reveals no lesions.  Neck: Supple, no thyromegaly, no lymphadenopathy, no jugular venous distention. Chest: Lungs are  clear to auscultation with good air movement. No rales, rhonchi or wheezes.  CV: Heart sounds are regular with an S1, S2. Patient has a soft 1/6 systolic murmur heard at left sternal border and apex without radiation to axilla or carotids. Abdomen: Soft, nontender, nondistended with normal active bowel sounds. No hepatosplenomegaly or palpable masses. Extremities: Patient has markedly swelling of his left elbow and antecubital space with increased warmth and erythema. He has a fluctuant 3 x 2.5 cm mass in the soft tissue of the antecubital space. He is able to move his elbow without pain other than the tightness from the abscess. He has no pain in his elbow joint. Skin: As noted above Neuro: Alert and oriented times 3; grossly  nonfocal.  Psych: Insight is good and judgment is appropriate. Mood and affect are anxious   Data Review:    Labs: Basic Metabolic Panel:  Recent Labs Lab 11/19/16 0140  NA 137  K 3.7  CL 100*  CO2 27  GLUCOSE 115*  BUN 8  CREATININE 0.75  CALCIUM 8.9   Liver Function Tests: No results for input(s): AST, ALT, ALKPHOS, BILITOT, PROT, ALBUMIN in the last 168 hours. No results for input(s): LIPASE, AMYLASE in the last 168 hours. No results for input(s): AMMONIA in the last 168 hours. CBC:  Recent Labs Lab 11/19/16 0140  WBC 12.9*  NEUTROABS 8.9*  HGB 13.7  HCT 40.1  MCV 89.5  PLT 294   Cardiac Enzymes: No results for input(s): CKTOTAL, CKMB, CKMBINDEX, TROPONINI in the last 168 hours.  BNP (last 3 results) No results for input(s): PROBNP in the last 8760 hours. CBG: No results for input(s): GLUCAP in the last 168 hours.  Urinalysis    Component Value Date/Time   COLORURINE YELLOW 07/18/2016 2109   APPEARANCEUR HAZY (A) 07/18/2016 2109   LABSPEC 1.012 07/18/2016 2109   PHURINE 5.0 07/18/2016 2109   GLUCOSEU NEGATIVE 07/18/2016 2109   HGBUR SMALL (A) 07/18/2016 2109   BILIRUBINUR NEGATIVE 07/18/2016 2109   KETONESUR NEGATIVE 07/18/2016 2109   PROTEINUR NEGATIVE 07/18/2016 2109   UROBILINOGEN 0.2 03/26/2014 2255   NITRITE NEGATIVE 07/18/2016 2109   LEUKOCYTESUR NEGATIVE 07/18/2016 2109      Radiographic Studies: Dg Elbow Complete Left  Result Date: 11/19/2016 CLINICAL DATA:  IVDA patient with history of laceration and keloid formation along the lateral elbow x3 years ago presenting with erythema and swelling of the elbow. EXAM: LEFT ELBOW - COMPLETE 3+ VIEW COMPARISON:  None. FINDINGS: Soft tissue swelling about the left elbow more so along the medial and dorsal aspects. No soft tissue emphysema. No fracture, dislocation or bone destruction. No radiopaque foreign body is noted. IMPRESSION: Generalized soft tissue induration about the left elbow more so  dorsomedially. No subcutaneous emphysema nor radiopaque foreign body. No acute osseous abnormality Electronically Signed   By: Tollie Eth M.D.   On: 11/19/2016 02:33    Assessment/Plan:   Principal Problem:   Cellulitis Active Problems:   IV drug user   Abscess  SOFT TISSUE INFECTION Cellulitis is being treated with Vanc and Zosyn Day #1 Can discontinue Zosyn if warranted by blood cultures and cultures from I&D General Surgery has been consulted for I/D for abscess  IVDU Patient last used 2 weeks ago, states he will not withdraw from heroin,  ALCOHOL USE  Patient placed on C1 protocol for Librium and Atarax per protocol. Can start standing Librium taper if needed however this time patient will pursue when necessary protocol.  HIV screening  The patient falls between the ages of 13-64 and should be screened for HIV, therefore HIV testing ordered.  Body mass index is 24.02 kg/m.  Other information:   DVT prophylaxis: Lovenox ordered. Code Status: Full code. Family Communication: Patient said it was not necessary to call his family Disposition Plan: Home Consults called: Gen. surgery, Will Admission status: Inpatient, med surg   The medical decision making on this patient was of high complexity and the patient is at high risk for clinical deterioration, therefore this is a level 3 visit.   Horatio PelSrobona Orma Flamingublu Marguarite Markov [facility] Pager [phone] Cell: [phone]   If 7PM-7AM, please contact night-coverage [email or web address] Password [email or web address] 11/19/2016, 9:15 AM

## 2016-11-19 NOTE — Progress Notes (Signed)
Pt. No longer a XXX patient.

## 2016-11-19 NOTE — Care Management Note (Signed)
Case Management Note  Patient Details  Name: Grant Tapia MRN: [id number] Date of Birth: [birth year]  Subjective/Objective:30 y/o m admitted w/L arm cellulitis. Hx: polysubstance abuse,etoh,cocaine,heroine. From home.                    Action/Plan:d/c plan home.   Expected Discharge Date:                  Expected Discharge Plan:  Home/Self Care  In-House Referral:  Clinical Social Work  Discharge planning Services  CM Consult  Post Acute Care Choice:    Choice offered to:     DME Arranged:    DME Agency:     HH Arranged:    HH Agency:     Status of Service:  In process, will continue to follow  If discussed at Long Length of Stay Meetings, dates discussed:    Additional Comments:  Lanier ClamMahabir, Shariff Lasky, RN 11/19/2016, 1:15 PM

## 2016-11-19 NOTE — ED Notes (Signed)
ED Provider at bedside answering questions for pt about the plan of care.

## 2016-11-19 NOTE — Progress Notes (Signed)
Pt. Arrived from [facility]. [facility] admitting paged and made aware. Pt. Comfortable in the bed and eating. Will continue to monitor.

## 2016-11-19 NOTE — Progress Notes (Signed)
Pt. Expressed he does not want to be XXX. He would like his girlfriend to know where he is. Will make MD and day RN aware.

## 2016-11-19 NOTE — Anesthesia Preprocedure Evaluation (Addendum)
Anesthesia Evaluation  Patient identified by MRN, date of birth, ID band Patient awake    Reviewed: Allergy & Precautions, NPO status , Patient's Chart, lab work & pertinent test results  Airway Mallampati: II  TM Distance: >3 FB Neck ROM: Full    Dental no notable dental hx. (+) Dental Advisory Given   Pulmonary neg pulmonary ROS,    Pulmonary exam normal breath sounds clear to auscultation       Cardiovascular hypertension, Normal cardiovascular exam Rhythm:Regular Rate:Normal     Neuro/Psych  Headaches, PSYCHIATRIC DISORDERS negative psych ROS   GI/Hepatic negative GI ROS, (+)     substance abuse  alcohol use, cocaine use, methamphetamine use and IV drug use, Heroin abuse Drug overdosage   Endo/Other  negative endocrine ROS  Renal/GU Renal diseasenegative Renal ROSAKI  negative genitourinary   Musculoskeletal negative musculoskeletal ROS (+)   Abdominal   Peds negative pediatric ROS (+)  Hematology negative hematology ROS (+)   Anesthesia Other Findings Hx polysubstance abuse, IVDA, EtOH abuse  Reproductive/Obstetrics                            Anesthesia Physical Anesthesia Plan  ASA: II  Anesthesia Plan: General   Post-op Pain Management:    Induction: Intravenous  PONV Risk Score and Plan: 2 and Ondansetron, Dexamethasone, Treatment may vary due to age or medical condition, Midazolam, Propofol infusion and Promethazine  Airway Management Planned: LMA  Additional Equipment:   Intra-op Plan:   Post-operative Plan: Extubation in OR  Informed Consent: I have reviewed the patients History and Physical, chart, labs and discussed the procedure including the risks, benefits and alternatives for the proposed anesthesia with the patient or authorized representative who has indicated his/her understanding and acceptance.   Dental advisory given  Plan Discussed with: CRNA,  Anesthesiologist and Surgeon  Anesthesia Plan Comments:        Anesthesia Quick Evaluation

## 2016-11-19 NOTE — Progress Notes (Signed)
General Surgery notes they would prefer Orthopedics consultation for I/D Dr Magdalene PatriciaHandy's phone called, he is in OR, message left with RN re consult and my call back number

## 2016-11-19 NOTE — Anesthesia Postprocedure Evaluation (Signed)
Anesthesia Post Note  Patient: Grant Tapia  Procedure(s) Performed: Procedure(s) (LRB): INCISION AND DRAINAGE LEFT ELBOW  ABSCESS (Left)     Patient location during evaluation: PACU Anesthesia Type: General Level of consciousness: sedated and patient cooperative Pain management: pain level controlled Vital Signs Assessment: post-procedure vital signs reviewed and stable Respiratory status: spontaneous breathing Cardiovascular status: stable Anesthetic complications: no    Last Vitals:  Vitals:   11/19/16 1756 11/19/16 1810  BP: (!) 146/94 (!) 148/95  Pulse: 75 94  Resp: 15 20  Temp:    SpO2: 100% 98%    Last Pain:  Vitals:   11/19/16 1810  TempSrc:   PainSc: 7                  Lewie LoronJohn Meira Wahba

## 2016-11-19 NOTE — Anesthesia Procedure Notes (Signed)
Procedure Name: LMA Insertion Date/Time: 11/19/2016 4:43 PM Performed by: Orvilla FusATO, Mardi Cannady A Pre-anesthesia Checklist: Patient identified, Emergency Drugs available, Suction available and Patient being monitored Patient Re-evaluated:Patient Re-evaluated prior to induction Oxygen Delivery Method: Circle System Utilized Preoxygenation: Pre-oxygenation with 100% oxygen Induction Type: IV induction Ventilation: Mask ventilation without difficulty LMA: LMA inserted LMA Size: 4.0 Number of attempts: 1 Airway Equipment and Method: Bite block Placement Confirmation: positive ETCO2 Tube secured with: Tape Dental Injury: Teeth and Oropharynx as per pre-operative assessment

## 2016-11-20 ENCOUNTER — Encounter (HOSPITAL_COMMUNITY): Payer: Self-pay | Admitting: Orthopedic Surgery

## 2016-11-20 LAB — RAPID URINE DRUG SCREEN, HOSP PERFORMED
AMPHETAMINES: NOT DETECTED
BENZODIAZEPINES: NOT DETECTED
Barbiturates: NOT DETECTED
Cocaine: NOT DETECTED
OPIATES: POSITIVE — AB
Tetrahydrocannabinol: NOT DETECTED

## 2016-11-20 MED ORDER — HYDROCODONE-ACETAMINOPHEN 10-325 MG PO TABS
1.0000 | ORAL_TABLET | ORAL | Status: DC | PRN
  Administered 2016-11-20 – 2016-11-23 (×11): 1 via ORAL
  Filled 2016-11-20 (×11): qty 1

## 2016-11-20 MED ORDER — DEXTROSE 5 % IV SOLN
1.0000 g | INTRAVENOUS | Status: DC
  Administered 2016-11-20 – 2016-11-22 (×3): 1 g via INTRAVENOUS
  Filled 2016-11-20 (×3): qty 10

## 2016-11-20 MED ORDER — ENOXAPARIN SODIUM 40 MG/0.4ML ~~LOC~~ SOLN
40.0000 mg | SUBCUTANEOUS | Status: DC
  Administered 2016-11-22: 40 mg via SUBCUTANEOUS
  Filled 2016-11-20 (×4): qty 0.4

## 2016-11-20 MED ORDER — HYDROCODONE-ACETAMINOPHEN 10-325 MG PO TABS
1.0000 | ORAL_TABLET | Freq: Once | ORAL | Status: AC
  Administered 2016-11-20: 1 via ORAL
  Filled 2016-11-20: qty 1

## 2016-11-20 NOTE — Progress Notes (Addendum)
PROGRESS NOTE    Grant Tapia  ZOX:[id number] DOB: 14-Mar-1986 DOA: 11/19/2016 PCP: Patient, No Pcp Per    Brief Narrative:  31 yo m with PMH of endocarditis, periotonsillar abscess, polysubstance abuse (etoh, heroin, methamphetamines, cocaine) who notes last use about 2-3 weeks ago at L arm near keloid.  He noticed pain and swelling 4 days ago and 2 days ago noted fevers, chills and sweats.  Presented to ED due to worsening pain.     Assessment & Plan:   Principal Problem:   Cellulitis Active Problems:   IV drug user   Abscess   Soft  Tissue Infection:   Abscess drained by orthopedics yesterday. Recommended hydrotherapy, will discuss recs for wound care. Continue vanc/zosyn (started 8/9 - ). Will continue broad specturm abx until able to narrow based on culture data.   Wound cultures pending (notable for gram negative rods and gram positive cocci).   Blood cultures pending Pain control with toradol and norco (1-2 10-325 mg, will decrease this to 1 pill q4 hrs)  IVDU: States his last use was Grant Tapia few weeks ago.  He does not think he needs any resources at this time for substance abuse.  ETOH Use: On CIWA protocol.    HIV pending  DVT prophylaxis: lovenox Code Status: full Family Communication: discussed with patient Disposition Plan: 8/11-12   Consultants:   orthopedics  Procedures:   I&D by ortho  Antimicrobials:   Vanc/Zosyn (8/9 - )     Subjective: Feeling better, still with pain at L elbow, especially when he extends arm.  Worried about healing because of prior scar which he had difficulty with healing.  Denies fevers, chest pain, shortness of breath.    Objective: Vitals:   11/19/16 1910 11/19/16 1940 11/19/16 2011 11/20/16 0504  BP: (!) 149/95 (!) 131/96 (!) 143/90 (!) 141/81  Pulse: 90 78 87 83  Resp: 18 12 15 18   Temp: 97.8 F (36.6 C) 98 F (36.7 C) 99.3 F (37.4 C) 98.2 F (36.8 C)  TempSrc:   Oral Oral  SpO2: 99% 100% 100% 100%   Weight:      Height:        Intake/Output Summary (Last 24 hours) at 11/20/16 1122 Last data filed at 11/20/16 8119  Gross per 24 hour  Intake          3067.92 ml  Output                0 ml  Net          3067.92 ml   Filed Weights   11/18/16 2346 11/19/16 0425  Weight: 81.6 kg (180 lb) 78.1 kg (172 lb 3.2 oz)    Examination:  General exam: Appears calm and comfortable  Respiratory system: Clear to auscultation. Respiratory effort normal. Cardiovascular system: S1 & S2 heard, RRR. No JVD, murmurs, rubs, gallops or clicks. No pedal edema. Gastrointestinal system: Abdomen is nondistended, soft and nontender. No organomegaly or masses felt. Normal bowel sounds heard. Central nervous system: Alert and oriented. No focal neurological deficits. Extremities: L arm with swelling over lateral elbow, edema more impressive than erythema, tender to palpation.  Wound from surgery packed.  ROM limited which he thinks is partly from the pain from incision.    Skin: No rashes, lesions or ulcers Psychiatry: Judgement and insight appear normal. Mood & affect appropriate.     Data Reviewed: I have personally reviewed following labs and imaging studies  CBC:  Recent Labs Lab 11/19/16  0140  WBC 12.9*  NEUTROABS 8.9*  HGB 13.7  HCT 40.1  MCV 89.5  PLT 294   Basic Metabolic Panel:  Recent Labs Lab 11/19/16 0140  NA 137  K 3.7  CL 100*  CO2 27  GLUCOSE 115*  BUN 8  CREATININE 0.75  CALCIUM 8.9   GFR: Estimated Creatinine Clearance: 146.4 mL/min (by C-G formula based on SCr of 0.75 mg/dL). Liver Function Tests: No results for input(s): AST, ALT, ALKPHOS, BILITOT, PROT, ALBUMIN in the last 168 hours. No results for input(s): LIPASE, AMYLASE in the last 168 hours. No results for input(s): AMMONIA in the last 168 hours. Coagulation Profile: No results for input(s): INR, PROTIME in the last 168 hours. Cardiac Enzymes: No results for input(s): CKTOTAL, CKMB, CKMBINDEX, TROPONINI  in the last 168 hours. BNP (last 3 results) No results for input(s): PROBNP in the last 8760 hours. HbA1C: No results for input(s): HGBA1C in the last 72 hours. CBG: No results for input(s): GLUCAP in the last 168 hours. Lipid Profile: No results for input(s): CHOL, HDL, LDLCALC, TRIG, CHOLHDL, LDLDIRECT in the last 72 hours. Thyroid Function Tests: No results for input(s): TSH, T4TOTAL, FREET4, T3FREE, THYROIDAB in the last 72 hours. Anemia Panel: No results for input(s): VITAMINB12, FOLATE, FERRITIN, TIBC, IRON, RETICCTPCT in the last 72 hours. Sepsis Labs: No results for input(s): PROCALCITON, LATICACIDVEN in the last 168 hours.  Recent Results (from the past 240 hour(s))  Surgical pcr screen     Status: Abnormal   Collection Time: 11/19/16  3:29 PM  Result Value Ref Range Status   MRSA, PCR NEGATIVE NEGATIVE Final   Staphylococcus aureus POSITIVE (Grant Tapia) NEGATIVE Final    Comment:        The Xpert SA Assay (FDA approved for NASAL specimens in patients over 23 years of age), is one component of Grant Tapia comprehensive surveillance program.  Test performance has been validated by [facility] for patients greater than or equal to 21 year old. It is not intended to diagnose infection nor to guide or monitor treatment.   Aerobic/Anaerobic Culture (surgical/deep wound)     Status: None (Preliminary result)   Collection Time: 11/19/16  5:07 PM  Result Value Ref Range Status   Specimen Description ABSCESS  Final   Special Requests LEFT ELBOW PATIENT ON FOLLOWING ZOSYN  Final   Gram Stain   Final    ABUNDANT WBC PRESENT,BOTH PMN AND MONONUCLEAR RARE GRAM NEGATIVE RODS FEW GRAM POSITIVE COCCI IN PAIRS    Culture PENDING  Incomplete   Report Status PENDING  Incomplete         Radiology Studies: Dg Elbow Complete Left  Result Date: 11/19/2016 CLINICAL DATA:  IVDA patient with history of laceration and keloid formation along the lateral elbow x3 years ago presenting with erythema  and swelling of the elbow. EXAM: LEFT ELBOW - COMPLETE 3+ VIEW COMPARISON:  None. FINDINGS: Soft tissue swelling about the left elbow more so along the medial and dorsal aspects. No soft tissue emphysema. No fracture, dislocation or bone destruction. No radiopaque foreign body is noted. IMPRESSION: Generalized soft tissue induration about the left elbow more so dorsomedially. No subcutaneous emphysema nor radiopaque foreign body. No acute osseous abnormality Electronically Signed   By: Tollie Eth M.D.   On: 11/19/2016 02:33        Scheduled Meds: . multivitamin with minerals  1 tablet Oral Daily  . thiamine  100 mg Intramuscular Once  . thiamine  100 mg Oral Daily  Continuous Infusions: . sodium chloride 125 mL/hr at 11/19/16 2028  . piperacillin-tazobactam (ZOSYN)  IV Stopped (11/20/16 1007)  . vancomycin Stopped (11/20/16 19140922)     LOS: 1 day    Time spent: 25 minutes    Grant Rion Grier Mittsaldwell Powell Jr, MD [facility] Pager [phone]  If 7PM-7AM, please contact night-coverage [email or web address] Password [email or web address] 11/20/2016, 11:22 AM

## 2016-11-21 DIAGNOSIS — L03114 Cellulitis of left upper limb: Secondary | ICD-10-CM

## 2016-11-21 LAB — CREATININE, SERUM
CREATININE: 0.74 mg/dL (ref 0.61–1.24)
GFR calc non Af Amer: >60 mL/min (ref >60)

## 2016-11-21 LAB — HIV ANTIBODY (ROUTINE TESTING W REFLEX): HIV SCREEN 4TH GENERATION: NONREACTIVE

## 2016-11-21 LAB — VANCOMYCIN, TROUGH: Vancomycin Tr: 10 ug/mL — ABNORMAL LOW (ref 15–20)

## 2016-11-21 NOTE — Progress Notes (Signed)
Pharmacy Antibiotic Note  Grant Tapia is a 31 y.o. male with h/o IVDU admitted on 11/19/2016 with cellulitis.  Pharmacy has been consulted for Vancomycin dosing.    Day #3 vancomycin. Zosyn changed to Ceftriaxone yesterday. Vancomycin trough = 10 mcg/ml on 1g q8h dosing (VT goal 10-15 mcg/ml) Renal function stable S/p I&D antecubital abscess 8/9  Plan: Continue Vancomycin 1 g IV q8h Daily SCr with q8h dosing vancomycin.  Height: 5\' 11"  (180.3 cm) Weight: 170 lb (77.1 kg) IBW/kg (Calculated) : 75.3  Temp (24hrs), Avg:98.4 F (36.9 C), Min:98 F (36.7 C), Max:98.6 F (37 C)   Recent Labs Lab 11/19/16 0140 11/21/16 0718  WBC 12.9*  --   CREATININE 0.75 0.74  VANCOTROUGH  --  10*    Estimated Creatinine Clearance: 146.4 mL/min (by C-G formula based on SCr of 0.74 mg/dL).    No Known Allergies  Antimicrobials this admission:  8/9 Zosyn >> 8/10 8/9 vanco >>  8/10 CTX >>  Dose adjustments this admission:   Microbiology results:  8/9 BCx: ngtd 8/9 abscess cx: (GS = rare GNR, few GPC pairs). Few K. Pneumoniae - culture still pending  Grant Tapia, Grant Tapia 11/21/2016 9:24 AM

## 2016-11-21 NOTE — Progress Notes (Signed)
PROGRESS NOTE    Grant Tapia  ZOX:[id number] DOB: 1986-02-19 DOA: 11/19/2016 PCP: Patient, No Pcp Per    Brief Narrative:  31 yo m with PMH of endocarditis, periotonsillar abscess, polysubstance abuse (etoh, heroin, methamphetamines, cocaine) who notes last use about 2-3 weeks ago since with left arm abscess, admitted for further management, status post incision and drainage of antecubital abscess by Dr. Carola Frost on 11/19/2016.   Assessment & Plan:   Principal Problem:   Cellulitis Active Problems:   IV drug user   Abscess   Left arm antecubital abscess and cellulitis  - Secondary to IV drug abuse, status post incision and drainage by Dr. Carola Frost 8/9 . - Deep surgical surgical Gram stain showing gram-positive cocci in pairs, and rare gram-negative rods, culture growing Klebsiella pneumonia so far, will continue with broad-spectrum antibiotic vancomycin and Zosyn pending sensitivities. - Continue with wound care, and recommendation for hydrotherapy, PT to see today for hydrotherapy. May need when necessary IV morphine prior to hydrotherapy, otherwise continue with Toradol and Norco for pain control.  IVDU: - States his last use was a few weeks ago.  He does not think he needs any resources at this time for substance abuse.  ETOH Use: - On CIWA protocol.  No evidence of withdrawals  HIV antibody nonreactive  DVT prophylaxis: lovenox Code Status: full Family Communication: None at bedside, discussed with patient  Disposition Plan: Home once stable   Consultants:   orthopedics  Procedures:   I&D by ortho  Antimicrobials:   Vanc/Zosyn (8/9 - )     Subjective: He denies any complaints, no fever, no chills, no chest pain, no shortness of breath pain is controlled, but he is anxious about hydrotherapy  Objective: Vitals:   11/20/16 1341 11/20/16 2206 11/20/16 2343 11/21/16 0603  BP: 135/81 (!) 147/87 (!) 149/92 (!) 141/87  Pulse: 79 81 76 78  Resp: 18 16 17 18    Temp: 98.6 F (37 C) 98.5 F (36.9 C) 98 F (36.7 C) 98.6 F (37 C)  TempSrc: Oral Oral Oral Oral  SpO2: 100% 100% 100% 100%  Weight:  77.1 kg (170 lb)    Height:  5\' 11"  (1.803 m)      Intake/Output Summary (Last 24 hours) at 11/21/16 1155 Last data filed at 11/21/16 0834  Gross per 24 hour  Intake             3920 ml  Output              175 ml  Net             3745 ml   Filed Weights   11/18/16 2346 11/19/16 0425 11/20/16 2206  Weight: 81.6 kg (180 lb) 78.1 kg (172 lb 3.2 oz) 77.1 kg (170 lb)    Examination:  Awake alert 3, no apparent distress Good air entry bilaterally, no wheezing or rales rhonchi The retina and rhythm, no rubs, gallops Abdomen soft, nontender, nondistended, bowel sounds present Extremities, left arm with swelling, wrapped with Ace wrap from mid hand to mid arm area, has good capillary refill, has some swelling.     Data Reviewed: I have personally reviewed following labs and imaging studies  CBC:  Recent Labs Lab 11/19/16 0140  WBC 12.9*  NEUTROABS 8.9*  HGB 13.7  HCT 40.1  MCV 89.5  PLT 294   Basic Metabolic Panel:  Recent Labs Lab 11/19/16 0140 11/21/16 0718  NA 137  --   K 3.7  --  CL 100*  --   CO2 27  --   GLUCOSE 115*  --   BUN 8  --   CREATININE 0.75 0.74  CALCIUM 8.9  --    GFR: Estimated Creatinine Clearance: 146.4 mL/min (by C-G formula based on SCr of 0.74 mg/dL). Liver Function Tests: No results for input(s): AST, ALT, ALKPHOS, BILITOT, PROT, ALBUMIN in the last 168 hours. No results for input(s): LIPASE, AMYLASE in the last 168 hours. No results for input(s): AMMONIA in the last 168 hours. Coagulation Profile: No results for input(s): INR, PROTIME in the last 168 hours. Cardiac Enzymes: No results for input(s): CKTOTAL, CKMB, CKMBINDEX, TROPONINI in the last 168 hours. BNP (last 3 results) No results for input(s): PROBNP in the last 8760 hours. HbA1C: No results for input(s): HGBA1C in the last 72  hours. CBG: No results for input(s): GLUCAP in the last 168 hours. Lipid Profile: No results for input(s): CHOL, HDL, LDLCALC, TRIG, CHOLHDL, LDLDIRECT in the last 72 hours. Thyroid Function Tests: No results for input(s): TSH, T4TOTAL, FREET4, T3FREE, THYROIDAB in the last 72 hours. Anemia Panel: No results for input(s): VITAMINB12, FOLATE, FERRITIN, TIBC, IRON, RETICCTPCT in the last 72 hours. Sepsis Labs: No results for input(s): PROCALCITON, LATICACIDVEN in the last 168 hours.  Recent Results (from the past 240 hour(s))  Culture, blood (Routine X 2) w Reflex to ID Panel     Status: None (Preliminary result)   Collection Time: 11/19/16  1:20 AM  Result Value Ref Range Status   Specimen Description BLOOD RIGHT ARM  Final   Special Requests IN PEDIATRIC BOTTLE Blood Culture adequate volume  Final   Culture   Final    NO GROWTH 2 DAYS Performed at [facility], [address] [place], [place] [address]    Report Status PENDING  Incomplete  Culture, blood (Routine X 2) w Reflex to ID Panel     Status: None (Preliminary result)   Collection Time: 11/19/16  1:45 AM  Result Value Ref Range Status   Specimen Description BLOOD RIGHT ARM  Final   Special Requests   Final    BOTTLES DRAWN AEROBIC AND ANAEROBIC Blood Culture adequate volume   Culture   Final    NO GROWTH 2 DAYS Performed at [facility], [address] [place], [place] [address]    Report Status PENDING  Incomplete  Surgical pcr screen     Status: Abnormal   Collection Time: 11/19/16  3:29 PM  Result Value Ref Range Status   MRSA, PCR NEGATIVE NEGATIVE Final   Staphylococcus aureus POSITIVE (A) NEGATIVE Final    Comment:        The Xpert SA Assay (FDA approved for NASAL specimens in patients over 421 years of age), is one component of a comprehensive surveillance program.  Test performance has been validated by [facility] for patients greater than or equal to 29 year old. It is not intended to  diagnose infection nor to guide or monitor treatment.   Aerobic/Anaerobic Culture (surgical/deep wound)     Status: None (Preliminary result)   Collection Time: 11/19/16  5:07 PM  Result Value Ref Range Status   Specimen Description ABSCESS  Final   Special Requests LEFT ELBOW PATIENT ON FOLLOWING ZOSYN  Final   Gram Stain   Final    ABUNDANT WBC PRESENT,BOTH PMN AND MONONUCLEAR RARE GRAM NEGATIVE RODS FEW GRAM POSITIVE COCCI IN PAIRS    Culture FEW KLEBSIELLA PNEUMONIAE  Final  Report Status PENDING  Incomplete         Radiology Studies: No results found.      Scheduled Meds: . enoxaparin (LOVENOX) injection  40 mg Subcutaneous Q24H  . multivitamin with minerals  1 tablet Oral Daily  . thiamine  100 mg Intramuscular Once  . thiamine  100 mg Oral Daily   Continuous Infusions: . sodium chloride 125 mL/hr at 11/21/16 0637  . cefTRIAXone (ROCEPHIN)  IV Stopped (11/20/16 2130)  . vancomycin Stopped (11/21/16 0901)     LOS: 2 days     Huey Bienenstock, MD [facility] Pager [phone]  If 7PM-7AM, please contact night-coverage [email or web address] Password [facility] 11/21/2016, 11:55 AM

## 2016-11-21 NOTE — Progress Notes (Signed)
   11/21/16 1500  Subjective Assessment  Subjective "Is this gonna hurt?"  Patient and Family Stated Goals wound healed  Date of Onset 11/21/16  Prior Treatments surgical I and D  Evaluation and Treatment  Evaluation and Treatment Procedures Explained to Patient/Family Yes  Evaluation and Treatment Procedures agreed to  Wound / Incision (Open or Dehisced) 11/21/16 Incision - Open Elbow Left;Anterior;Mid **PT** open I & D L antecubital space  Date First Assessed/Time First Assessed: 11/21/16 1400   Wound Type: Incision - Open  Location: Elbow  Location Orientation: Left;Anterior;Mid  Wound Description (Comments): **PT** open I & D L antecubital space  Dressing Type Gauze (Comment);Moist to moist (packing strip, 4x4s, 1/2 ABD, kerlix, ACE)  Dressing Change Frequency Daily  Site / Wound Assessment Painful;Red;Yellow;Granulation tissue;Bleeding  % Wound base Red or Granulating 85%  % Wound base Yellow/Fibrinous Exudate 15%  Peri-wound Assessment Edema;Induration  Wound Length (cm) 3 cm  Wound Width (cm) 1.5 cm  Wound Depth (cm) (unable to visualize wound base, ~3-4 cm)  Tunneling (cm) see above/~3:00 and 7:00  Margins Unattached edges (unapproximated)  Closure None  Drainage Amount Moderate  Drainage Description Serosanguineous;No odor  Treatment Cleansed;Packing (Impregnated strip);Hydrotherapy (Pulse lavage)  Hydrotherapy  Pulsed lavage therapy - wound location L elbow  Pulsed Lavage with Suction (psi) 4 psi  Pulsed Lavage with Suction - Normal Saline Used 1000 mL  Pulsed Lavage Tip Tip with splash shield  Wound Therapy - Assess/Plan/Recommendations  Wound Therapy - Clinical Statement pt will benefit from hydrotherapy to facilitate wound healing  Wound Therapy - Functional Problem List decr functional use L UE  Factors Delaying/Impairing Wound Healing Infection - systemic/local;Substance abuse  Hydrotherapy Plan Debridement;Dressing change;Patient/family education;Pulsatile lavage  with suction  Wound Therapy - Frequency 6X / week  Wound Plan pulsed lavage 6x/wk  for cleansing and debridement to facilitated wound healing  Wound Therapy Goals - Improve the function of patient's integumentary system by progressing the wound(s) through the phases of wound healing by:  Decrease Necrotic Tissue to 5  Decrease Necrotic Tissue - Progress Goal set today  Increase Granulation Tissue to 95  Increase Granulation Tissue - Progress Goal set today  Patient/Family will be able to  verbalize dressing changes and return demo HEP/AROM LUE  Patient/Family Instruction Goal - Progress Goal set today  Goals/treatment plan/discharge plan were made with and agreed upon by patient/family Yes  Time For Goal Achievement 7 days  Wound Therapy - Potential for Goals Good

## 2016-11-21 NOTE — Progress Notes (Signed)
Assumed care of patient from off going RN. No changes in initial am assessment. Cont with plan of care. PT completed pulse lavage. Pt tolerated well

## 2016-11-22 LAB — CREATININE, SERUM
Creatinine, Ser: 0.77 mg/dL (ref 0.61–1.24)
GFR calc Af Amer: >60 mL/min (ref >60)
GFR calc non Af Amer: >60 mL/min (ref >60)

## 2016-11-22 NOTE — Progress Notes (Signed)
PROGRESS NOTE    Grant Tapia  ZOX:[id number] DOB: [birth year] DOA: 11/19/2016 PCP: Patient, No Pcp Per    Brief Narrative:  31 yo m with PMH of endocarditis, periotonsillar abscess, polysubstance abuse (etoh, heroin, methamphetamines, cocaine) who notes last use about 2-3 weeks ago since with left arm abscess, admitted for further management, status post incision and drainage of antecubital abscess by Dr. Carola Frost on 11/19/2016. Culture growing Klebsiella, currently on Rocephin, currently on hydrotherapy.   Assessment & Plan:   Principal Problem:   Cellulitis Active Problems:   IV drug user   Abscess   Left arm antecubital abscess and cellulitis  - Secondary to IV drug abuse, status post incision and drainage by Dr. Carola Frost 8/9 . - Deep surgical surgical Gram stain showing gram-positive cocci in pairs, and rare gram-negative rods, culture growing Klebsiella pneumonia , pansensitive, initially on vancomycin and Zosyn, currently transitioned to Rocephin. - Continue with hydrotherapy per PT.  IVDU: - States his last use was a few weeks ago.  He does not think he needs any resources at this time for substance abuse.  ETOH Use: - On CIWA protocol.  No evidence of withdrawals  HIV antibody nonreactive  DVT prophylaxis: lovenox Code Status: full Family Communication: None at bedside, discussed with patient  Disposition Plan: Home once stable   Consultants:   orthopedics  Procedures:   I&D by ortho  Antimicrobials:   Vanc/Zosyn (8/9 - )     Subjective: Eyes any fever, chills, reports her pain is controlled,. Objective: Vitals:   11/21/16 1435 11/21/16 2154 11/22/16 0606 11/22/16 1308  BP: (!) 147/95 (!) 149/82 138/81 125/79  Pulse: 72 76 68 78  Resp: 18 18 18 18   Temp: 97.8 F (36.6 C) 98.3 F (36.8 C) 98.2 F (36.8 C) 98.3 F (36.8 C)  TempSrc: Oral Oral Oral Oral  SpO2: 100% 100% 100% 100%  Weight:      Height:        Intake/Output Summary (Last  24 hours) at 11/22/16 1323 Last data filed at 11/22/16 0300  Gross per 24 hour  Intake             3860 ml  Output                0 ml  Net             3860 ml   Filed Weights   11/18/16 2346 11/19/16 0425 11/20/16 2206  Weight: 81.6 kg (180 lb) 78.1 kg (172 lb 3.2 oz) 77.1 kg (170 lb)    Examination:  Awake alert 3, no apparent distress Quadrants bilaterally, no wheezing, no rales or rhonchi Regular rate and rhythm, no rubs or murmurs gallops Abdomen soft, nontender, nondistended Left arm swelling has significantly subsided, has good capillary refill , left forearm Ace wrap.    Data Reviewed: I have personally reviewed following labs and imaging studies  CBC:  Recent Labs Lab 11/19/16 0140  WBC 12.9*  NEUTROABS 8.9*  HGB 13.7  HCT 40.1  MCV 89.5  PLT 294   Basic Metabolic Panel:  Recent Labs Lab 11/19/16 0140 11/21/16 0718 11/22/16 0509  NA 137  --   --   K 3.7  --   --   CL 100*  --   --   CO2 27  --   --   GLUCOSE 115*  --   --   BUN 8  --   --   CREATININE 0.75 0.74 0.77  CALCIUM  8.9  --   --    GFR: Estimated Creatinine Clearance: 146.4 mL/min (by C-G formula based on SCr of 0.77 mg/dL). Liver Function Tests: No results for input(s): AST, ALT, ALKPHOS, BILITOT, PROT, ALBUMIN in the last 168 hours. No results for input(s): LIPASE, AMYLASE in the last 168 hours. No results for input(s): AMMONIA in the last 168 hours. Coagulation Profile: No results for input(s): INR, PROTIME in the last 168 hours. Cardiac Enzymes: No results for input(s): CKTOTAL, CKMB, CKMBINDEX, TROPONINI in the last 168 hours. BNP (last 3 results) No results for input(s): PROBNP in the last 8760 hours. HbA1C: No results for input(s): HGBA1C in the last 72 hours. CBG: No results for input(s): GLUCAP in the last 168 hours. Lipid Profile: No results for input(s): CHOL, HDL, LDLCALC, TRIG, CHOLHDL, LDLDIRECT in the last 72 hours. Thyroid Function Tests: No results for  input(s): TSH, T4TOTAL, FREET4, T3FREE, THYROIDAB in the last 72 hours. Anemia Panel: No results for input(s): VITAMINB12, FOLATE, FERRITIN, TIBC, IRON, RETICCTPCT in the last 72 hours. Sepsis Labs: No results for input(s): PROCALCITON, LATICACIDVEN in the last 168 hours.  Recent Results (from the past 240 hour(s))  Culture, blood (Routine X 2) w Reflex to ID Panel     Status: None (Preliminary result)   Collection Time: 11/19/16  1:20 AM  Result Value Ref Range Status   Specimen Description BLOOD RIGHT ARM  Final   Special Requests IN PEDIATRIC BOTTLE Blood Culture adequate volume  Final   Culture   Final    NO GROWTH 3 DAYS Performed at [facility], [address] [place], [place] [address]    Report Status PENDING  Incomplete  Culture, blood (Routine X 2) w Reflex to ID Panel     Status: None (Preliminary result)   Collection Time: 11/19/16  1:45 AM  Result Value Ref Range Status   Specimen Description BLOOD RIGHT ARM  Final   Special Requests   Final    BOTTLES DRAWN AEROBIC AND ANAEROBIC Blood Culture adequate volume   Culture   Final    NO GROWTH 3 DAYS Performed at [facility], [address] [place], [place] [address]    Report Status PENDING  Incomplete  Surgical pcr screen     Status: Abnormal   Collection Time: 11/19/16  3:29 PM  Result Value Ref Range Status   MRSA, PCR NEGATIVE NEGATIVE Final   Staphylococcus aureus POSITIVE (A) NEGATIVE Final    Comment:        The Xpert SA Assay (FDA approved for NASAL specimens in patients over 29 years of age), is one component of a comprehensive surveillance program.  Test performance has been validated by [facility] for patients greater than or equal to 226 year old. It is not intended to diagnose infection nor to guide or monitor treatment.   Aerobic/Anaerobic Culture (surgical/deep wound)     Status: None (Preliminary result)   Collection Time: 11/19/16  5:07 PM  Result Value Ref Range Status    Specimen Description ABSCESS  Final   Special Requests LEFT ELBOW PATIENT ON FOLLOWING ZOSYN  Final   Gram Stain   Final    ABUNDANT WBC PRESENT,BOTH PMN AND MONONUCLEAR RARE GRAM NEGATIVE RODS FEW GRAM POSITIVE COCCI IN PAIRS    Culture   Final    FEW KLEBSIELLA PNEUMONIAE NO ANAEROBES ISOLATED; CULTURE IN PROGRESS FOR 5 DAYS    Report Status PENDING  Incomplete   Organism ID, Bacteria KLEBSIELLA  PNEUMONIAE  Final      Susceptibility   Klebsiella pneumoniae - MIC*    AMPICILLIN >=32 RESISTANT Resistant     CEFAZOLIN <=4 SENSITIVE Sensitive     CEFEPIME <=1 SENSITIVE Sensitive     CEFTAZIDIME <=1 SENSITIVE Sensitive     CEFTRIAXONE <=1 SENSITIVE Sensitive     CIPROFLOXACIN <=0.25 SENSITIVE Sensitive     GENTAMICIN <=1 SENSITIVE Sensitive     IMIPENEM <=0.25 SENSITIVE Sensitive     TRIMETH/SULFA <=20 SENSITIVE Sensitive     AMPICILLIN/SULBACTAM <=2 SENSITIVE Sensitive     PIP/TAZO <=4 SENSITIVE Sensitive     Extended ESBL NEGATIVE Sensitive     * FEW KLEBSIELLA PNEUMONIAE         Radiology Studies: No results found.      Scheduled Meds: . enoxaparin (LOVENOX) injection  40 mg Subcutaneous Q24H  . multivitamin with minerals  1 tablet Oral Daily  . thiamine  100 mg Intramuscular Once  . thiamine  100 mg Oral Daily   Continuous Infusions: . sodium chloride 125 mL/hr at 11/22/16 1013  . cefTRIAXone (ROCEPHIN)  IV Stopped (11/21/16 2209)  . vancomycin 1,000 mg (11/22/16 1010)     LOS: 3 days     Huey Bienenstock, MD [facility] Pager [phone]  If 7PM-7AM, please contact night-coverage [email or web address] Password [email or web address] 11/22/2016, 1:23 PM

## 2016-11-23 DIAGNOSIS — L0291 Cutaneous abscess, unspecified: Secondary | ICD-10-CM

## 2016-11-23 DIAGNOSIS — L03112 Cellulitis of left axilla: Secondary | ICD-10-CM

## 2016-11-23 DIAGNOSIS — F1990 Other psychoactive substance use, unspecified, uncomplicated: Secondary | ICD-10-CM

## 2016-11-23 DIAGNOSIS — L03012 Cellulitis of left finger: Secondary | ICD-10-CM

## 2016-11-23 DIAGNOSIS — L0390 Cellulitis, unspecified: Secondary | ICD-10-CM

## 2016-11-23 MED ORDER — CEPHALEXIN 500 MG PO CAPS
500.0000 mg | ORAL_CAPSULE | Freq: Four times a day (QID) | ORAL | Status: DC
  Administered 2016-11-23: 500 mg via ORAL
  Filled 2016-11-23: qty 1

## 2016-11-23 MED ORDER — CEPHALEXIN 500 MG PO CAPS: 500.0000 mg | ORAL_CAPSULE | Freq: Four times a day (QID) | ORAL | 0 refills | Status: AC

## 2016-11-23 MED ORDER — MUPIROCIN 2 % EX OINT
1.0000 "application " | TOPICAL_OINTMENT | Freq: Two times a day (BID) | CUTANEOUS | Status: DC
  Administered 2016-11-23: 1 via NASAL
  Filled 2016-11-23: qty 22

## 2016-11-23 MED ORDER — THIAMINE HCL 100 MG PO TABS: 100.0000 mg | ORAL_TABLET | Freq: Every day | ORAL | 1 refills | Status: DC

## 2016-11-23 MED ORDER — CHLORHEXIDINE GLUCONATE CLOTH 2 % EX PADS
6.0000 | MEDICATED_PAD | Freq: Every day | CUTANEOUS | Status: DC
  Administered 2016-11-23: 6 via TOPICAL

## 2016-11-23 NOTE — Progress Notes (Signed)
Pt given discharge teaching including medications and wound care for home. Pt verbalized understanding of all discharge instructions. Discharge packet with Pt at time of discharge. VSS and no acute changes noted with Pt's assessment at time of discharge

## 2016-11-23 NOTE — Care Management Note (Signed)
Case Management Note  Patient Details  Name: Grant Tapia MRN: [id number] Date of Birth: 12-Oct-1985  Subjective/Objective: 31 y/o m admitted w/L elbow abscess. Hx: iv drug use. From home. No pcp,or health insurance. Provided w/indigent pcp listing-encouraged [facility]-patient will make own pcp appt. Provided w/communityr esource list-affordable care act-health insurance, [employer] med list. Patient voiced understanding. Patient states unable to pay$150  [facility]-he has a friend who can do dressing changes-PT/Nurse to instruct patient on dsg changes.                  Action/Plan:d/c home.   Expected Discharge Date:  11/23/16               Expected Discharge Plan:  Home/Self Care  In-House Referral:  Clinical Social Work  Discharge planning Services  CM Consult, Indigent Health Clinic  Post Acute Care Choice:    Choice offered to:     DME Arranged:    DME Agency:     HH Arranged:    HH Agency:     Status of Service:  Completed, signed off  If discussed at [employer] Meetings, dates discussed:    Additional Comments:  Lanier ClamMahabir, Jaleah Lefevre, RN 11/23/2016, 12:12 PM

## 2016-11-23 NOTE — Discharge Summary (Signed)
Physician Discharge Summary  Rommie Dunn MRN: [id number] DOB/AGE: [birth year] 31 y.o.  PCP: Patient, No Pcp Per   Admit date: 11/19/2016 Discharge date: 11/23/2016  Discharge Diagnoses:    Principal Problem:   Cellulitis Active Problems:   IV drug user   Abscess    Follow-up recommendations Follow-up with PCP in 3-5 days , including all  additional recommended appointments as below Follow-up CBC, CMP in 3-5 days  Patient needs to follow-up with Dr. Marcelino Scot, wound care recommendations per Dr. Marcelino Scot     Current Discharge Medication List    START taking these medications   Details  cephALEXin (KEFLEX) 500 MG capsule Take 1 capsule (500 mg total) by mouth 4 (four) times daily. Qty: 40 capsule, Refills: 0    thiamine 100 MG tablet Take 1 tablet (100 mg total) by mouth daily. Qty: 30 tablet, Refills: 1         Discharge Condition: Stable Discharge Instructions Get Medicines reviewed and adjusted: Please take all your medications with you for your next visit with your Primary MD  Please request your Primary MD to go over all hospital tests and procedure/radiological results at the follow up, please ask your Primary MD to get all Hospital records sent to his/her office.  If you experience worsening of your admission symptoms, develop shortness of breath, life threatening emergency, suicidal or homicidal thoughts you must seek medical attention immediately by calling 911 or calling your MD immediately if symptoms less severe.  You must read complete instructions/literature along with all the possible adverse reactions/side effects for all the Medicines you take and that have been prescribed to you. Take any new Medicines after you have completely understood and accpet all the possible adverse reactions/side effects.   Do not drive when taking Pain medications.   Do not take more than prescribed Pain, Sleep and Anxiety Medications  Special Instructions: If you  have smoked or chewed Tobacco in the last 2 yrs please stop smoking, stop any regular Alcohol and or any Recreational drug use.  Wear Seat belts while driving.  Please note  You were cared for by a hospitalist during your hospital stay. Once you are discharged, your primary care physician will handle any further medical issues. Please note that NO REFILLS for any discharge medications will be authorized once you are discharged, as it is imperative that you return to your primary care physician (or establish a relationship with a primary care physician if you do not have one) for your aftercare needs so that they can reassess your need for medications and monitor your lab values.  Discharge Instructions    Diet - low sodium heart healthy    Complete by:  As directed    Increase activity slowly    Complete by:  As directed        No Known Allergies    Disposition: 01-Home or Self Care   Consults: Dr. Marcelino Scot    Significant Diagnostic Studies:  Dg Elbow Complete Left  Result Date: 11/19/2016 CLINICAL DATA:  IVDA patient with history of laceration and keloid formation along the lateral elbow x3 years ago presenting with erythema and swelling of the elbow. EXAM: LEFT ELBOW - COMPLETE 3+ VIEW COMPARISON:  None. FINDINGS: Soft tissue swelling about the left elbow more so along the medial and dorsal aspects. No soft tissue emphysema. No fracture, dislocation or bone destruction. No radiopaque foreign body is noted. IMPRESSION: Generalized soft tissue induration about the left elbow more so dorsomedially. No subcutaneous  emphysema nor radiopaque foreign body. No acute osseous abnormality Electronically Signed   By: Tollie Eth M.D.   On: 11/19/2016 02:33        Filed Weights   11/18/16 2346 11/19/16 0425 11/20/16 2206  Weight: 81.6 kg (180 lb) 78.1 kg (172 lb 3.2 oz) 77.1 kg (170 lb)     Microbiology: Recent Results (from the past 240 hour(s))  Culture, blood (Routine X 2) w  Reflex to ID Panel     Status: None (Preliminary result)   Collection Time: 11/19/16  1:20 AM  Result Value Ref Range Status   Specimen Description BLOOD RIGHT ARM  Final   Special Requests IN PEDIATRIC BOTTLE Blood Culture adequate volume  Final   Culture   Final    NO GROWTH 3 DAYS Performed at [facility], [address], [place], [place] [address]    Report Status PENDING  Incomplete  Culture, blood (Routine X 2) w Reflex to ID Panel     Status: None (Preliminary result)   Collection Time: 11/19/16  1:45 AM  Result Value Ref Range Status   Specimen Description BLOOD RIGHT ARM  Final   Special Requests   Final    BOTTLES DRAWN AEROBIC AND ANAEROBIC Blood Culture adequate volume   Culture   Final    NO GROWTH 3 DAYS Performed at [facility], [address], [place], [place] [address]    Report Status PENDING  Incomplete  Surgical pcr screen     Status: Abnormal   Collection Time: 11/19/16  3:29 PM  Result Value Ref Range Status   MRSA, PCR NEGATIVE NEGATIVE Final   Staphylococcus aureus POSITIVE (A) NEGATIVE Final    Comment:        The Xpert SA Assay (FDA approved for NASAL specimens in patients over 33 years of age), is one component of a comprehensive surveillance program.  Test performance has been validated by [facility] for patients greater than or equal to 58 year old. It is not intended to diagnose infection nor to guide or monitor treatment.   Aerobic/Anaerobic Culture (surgical/deep wound)     Status: None (Preliminary result)   Collection Time: 11/19/16  5:07 PM  Result Value Ref Range Status   Specimen Description ABSCESS  Final   Special Requests LEFT ELBOW PATIENT ON FOLLOWING ZOSYN  Final   Gram Stain   Final    ABUNDANT WBC PRESENT,BOTH PMN AND MONONUCLEAR RARE GRAM NEGATIVE RODS FEW GRAM POSITIVE COCCI IN PAIRS    Culture   Final    FEW KLEBSIELLA PNEUMONIAE NO ANAEROBES ISOLATED; CULTURE IN PROGRESS FOR 5 DAYS    Report Status  PENDING  Incomplete   Organism ID, Bacteria KLEBSIELLA PNEUMONIAE  Final      Susceptibility   Klebsiella pneumoniae - MIC*    AMPICILLIN >=32 RESISTANT Resistant     CEFAZOLIN <=4 SENSITIVE Sensitive     CEFEPIME <=1 SENSITIVE Sensitive     CEFTAZIDIME <=1 SENSITIVE Sensitive     CEFTRIAXONE <=1 SENSITIVE Sensitive     CIPROFLOXACIN <=0.25 SENSITIVE Sensitive     GENTAMICIN <=1 SENSITIVE Sensitive     IMIPENEM <=0.25 SENSITIVE Sensitive     TRIMETH/SULFA <=20 SENSITIVE Sensitive     AMPICILLIN/SULBACTAM <=2 SENSITIVE Sensitive     PIP/TAZO <=4 SENSITIVE Sensitive     Extended ESBL NEGATIVE Sensitive     * FEW KLEBSIELLA PNEUMONIAE       Blood Culture    Component Value  Date/Time   SDES ABSCESS 11/19/2016 1707   SPECREQUEST LEFT ELBOW PATIENT ON FOLLOWING ZOSYN 11/19/2016 1707   CULT  11/19/2016 1707    FEW KLEBSIELLA PNEUMONIAE NO ANAEROBES ISOLATED; CULTURE IN PROGRESS FOR 5 DAYS    REPTSTATUS PENDING 11/19/2016 1707      Labs: Results for orders placed or performed during the hospital encounter of 11/19/16 (from the past 48 hour(s))  Creatinine, serum     Status: None   Collection Time: 11/22/16  5:09 AM  Result Value Ref Range   Creatinine, Ser 0.77 0.61 - 1.24 mg/dL   GFR calc non Af Amer >60 >60 mL/min   GFR calc Af Amer >60 >60 mL/min    Comment: (NOTE) The eGFR has been calculated using the CKD EPI equation. This calculation has not been validated in all clinical situations. eGFR's persistently <60 mL/min signify possible Chronic Kidney Disease.      Lipid Panel  No results found for: CHOL, TRIG, HDL, CHOLHDL, VLDL, LDLCALC, LDLDIRECT   No results found for: HGBA1C   Lab Results  Component Value Date   CREATININE 0.77 11/22/2016     HPI :  31 yo m with PMH of endocarditis, periotonsillar abscess, polysubstance abuse (etoh, heroin, methamphetamines, cocaine) who notes last use about 2-3 weeks ago since with left arm abscess, admitted for  further management, status post incision and drainage of antecubital abscess by Dr. Marcelino Scot on 11/19/2016. Culture growing Klebsiella, currently on Rocephin, currently on hydrotherapy.   HOSPITAL COURSE:   Left arm antecubital abscess and cellulitis  - Secondary to IV drug abuse, status post incision and drainage by Dr. Marcelino Scot 8/9 . - Deep surgical surgical Gram stain showing gram-positive cocci in pairs, and rare gram-negative rods, culture growing Klebsiella pneumonia , pansensitive, initially on vancomycin and Zosyn, currently transitioned to Rocephin., Now transition to Keflex for 10 days - Continue with hydrotherapy per PT.  IVDU: - States his last use was a few weeks ago.  He does not think he needs any resources at this time for substance abuse.  ETOH Use: - On CIWA protocol.  No evidence of withdrawals  HIV antibody nonreactive   Discharge Exam:  Blood pressure (!) 138/97, pulse 68, temperature 97.8 F (36.6 C), temperature source Oral, resp. rate 20, height '5\' 11"'$  (1.803 m), weight 77.1 kg (170 lb), SpO2 100 %.    Awake alert 3, no apparent distress Quadrants bilaterally, no wheezing, no rales or rhonchi Regular rate and rhythm, no rubs or murmurs gallops Abdomen soft, nontender, nondistended Left arm swelling has significantly subsided, has good capillary refill , left forearm Ace wrap.   Follow-up Information    Altamese Artemus, MD Follow up on 11/25/2016.   Specialty:  Orthopedic Surgery Why:  follow up for L arm wound  Contact information: [address] [place] [address] [phone]           Signed: Reyne Dumas 11/23/2016, 11:57 AM        Time spent >1 hour

## 2016-11-23 NOTE — Discharge Instructions (Signed)
Orthopaedic/wound care discharge instructions  Daily packing changes to L arm Cover with 4x4's, ABD, kerlix (rolled gauze), and ACE

## 2016-11-23 NOTE — Progress Notes (Signed)
   11/23/16 1400  Subjective Assessment  Subjective I am glad you're here  Pt instructed in dressing changes/frequency/necessary supplies; pt verbalizes understanding  Patient and Family Stated Goals wound healed  Date of Onset 11/21/16  Prior Treatments surgical I and D  Evaluation and Treatment  Evaluation and Treatment Procedures Explained to Patient/Family Yes  Evaluation and Treatment Procedures agreed to  Wound / Incision (Open or Dehisced) 11/21/16 Incision - Open Elbow Left;Anterior;Mid **PT** open I & D L antecubital space  Date First Assessed/Time First Assessed: 11/21/16 1400   Wound Type: Incision - Open  Location: Elbow  Location Orientation: Left;Anterior;Mid  Wound Description (Comments): **PT** open I & D L antecubital space  Dressing Type (iodoform, 1/4 ABD, kerlix, ace)  Dressing Change Frequency Daily (outer dressing, packing ok qod)  % Wound base Red or Granulating 100%  % Wound base Yellow/Fibrinous Exudate 0%  Hydrotherapy  Pulsed lavage therapy - wound location L elbow  Pulsed Lavage with Suction (psi) 4 psi  Pulsed Lavage with Suction - Normal Saline Used 1000 mL  Pulsed Lavage Tip Tip with splash shield  Wound Therapy - Assess/Plan/Recommendations  Wound Therapy - Clinical Statement pt will benefit from hydrotherapy to facilitate wound healing  Wound Therapy - Functional Problem List decr functional use L UE  Factors Delaying/Impairing Wound Healing Infection - systemic/local;Substance abuse  Hydrotherapy Plan Debridement;Dressing change;Patient/family education;Pulsatile lavage with suction  Wound Therapy - Frequency 6X / week  Wound Therapy - Follow Up Recommendations Other (comment) (MD f/u)  Wound Plan pulsed lavage 6x/wk  for cleansing and debridement to facilitated wound healing  Wound Therapy Goals - Improve the function of patient's integumentary system by progressing the wound(s) through the phases of wound healing by:  Decrease Necrotic Tissue to 5   Decrease Necrotic Tissue - Progress Met  Increase Granulation Tissue to 95  Increase Granulation Tissue - Progress Met  Patient/Family will be able to  verbalize dressing changes and return demo HEP/AROM LUE  Patient/Family Instruction Goal - Progress Met  Time For Goal Achievement 7 days  Wound Therapy - Potential for Goals Good

## 2016-11-23 NOTE — Op Note (Signed)
[employer]:  Hanisch, Hardy                ACCOUNT NO.:  [id number]  MEDICAL RECORD NO.:  [id number]  LOCATION:  [facility]                         FACILITY:  [facility]  PHYSICIAN:  Doralee AlbinoMichael H. Carola FrostHandy, M.D. DATE OF BIRTH:  03-16-1986  DATE OF PROCEDURE:  11/19/2016 DATE OF DISCHARGE:                              OPERATIVE REPORT   PREOPERATIVE DIAGNOSIS:  Left antecubital elbow abscess.  POSTOPERATIVE DIAGNOSIS:  Left antecubital elbow abscess.  PROCEDURE:  Incision and drainage of left elbow deep abscess.  SURGEON:  Doralee AlbinoMichael H. Carola FrostHandy, M.D.  ASSISTANT:  None.  ANESTHESIA:  General.  COMPLICATIONS:  None.  TOURNIQUET:  None.  DISPOSITION:  To PACU.  CONDITION:  Stable.  BRIEF SUMMARY OF INDICATIONS FOR PROCEDURE:  Sheila Oatsathan Goodgame is a 31 year old male with a history of polysubstance abuse, who sustained a left antecubital abscess secondary to IV drug use.  The patient has been on the Medical Service.  He did undergo blood cultures, but no specimen from the area locally.  There has been no active drainage.  Pain has remained severe, well localized, as has the erythema and swelling.  No sensory or motor loss distally.  I discussed with him the risks and benefits of surgery including potential for persistent infection, the need for further surgery, DVT, PE, neurovascular injury, loss of motion, and multiple others.  He strongly wished to proceed.  BRIEF SUMMARY OF PROCEDURE:  The patient was taken to the operating room where general anesthesia was induced.  His left upper extremity was prepped and draped in usual sterile fashion.  He had a large keloid on the edge of the abscess and the patient had noted preoperatively that he was susceptible to keloid formation.  Consequently, a 4 cm incision was made through the keloid, which allowed for direct access into the abscess.  After the incision, a blunt Kelly retractor was used to push through the fascia into the abscess, which released an  eruption of purulent material, which was close to 15 to 20 mL.  This had 2 components, 1 which went deep and 1 which was more superficial.  Both limbs of the abscess were thoroughly explored removing fibrinous debris and thoroughly lavaging the area with a combination of soap and 3000 mL of saline.  The wound was then packed open with iodoform gauze into both cavities.  Sterile gently compressive dressing was applied.  The patient was then taken to the PACU in stable condition.  PROGNOSIS:  Mr. Colon BranchCarson will undergo packing changes and hydrotherapy beginning in 48 to 72 hours.  He will remain on IV antibiotics.  The anaerobic and aerobic cultures obtained at the time of initial abscess puncture today will be followed and the Medical Service will tailor their regimen accordingly.  We would anticipate dressing changes until closure from the inside out conversion from IV to oral antibiotics would be expected at the time of discharge.     Doralee AlbinoMichael H. Carola FrostHandy, M.D.     [facility]/MEDQ  D:  11/22/2016  T:  11/23/2016  Job:  [id number]

## 2016-11-24 LAB — CULTURE, BLOOD (ROUTINE X 2)
CULTURE: NO GROWTH
Culture: NO GROWTH
SPECIAL REQUESTS: ADEQUATE
Special Requests: ADEQUATE

## 2016-11-24 LAB — AEROBIC/ANAEROBIC CULTURE W GRAM STAIN (SURGICAL/DEEP WOUND)

## 2016-12-29 ENCOUNTER — Inpatient Hospital Stay (HOSPITAL_COMMUNITY)
Admission: EM | Admit: 2016-12-29 | Discharge: 2017-01-01 | DRG: 093 | Disposition: A | Discharge status: Home / Self Care | Payer: Self-pay | Attending: Nephrology | Admitting: Nephrology

## 2016-12-29 ENCOUNTER — Encounter (HOSPITAL_COMMUNITY): Payer: Self-pay | Admitting: Emergency Medicine

## 2016-12-29 ENCOUNTER — Emergency Department (HOSPITAL_COMMUNITY): Payer: Self-pay

## 2016-12-29 DIAGNOSIS — R6510 Systemic inflammatory response syndrome (SIRS) of non-infectious origin without acute organ dysfunction: Secondary | ICD-10-CM

## 2016-12-29 DIAGNOSIS — R6520 Severe sepsis without septic shock: Secondary | ICD-10-CM | POA: Diagnosis present

## 2016-12-29 DIAGNOSIS — I10 Essential (primary) hypertension: Secondary | ICD-10-CM | POA: Diagnosis present

## 2016-12-29 DIAGNOSIS — R509 Fever, unspecified: Secondary | ICD-10-CM

## 2016-12-29 DIAGNOSIS — R7989 Other specified abnormal findings of blood chemistry: Secondary | ICD-10-CM | POA: Diagnosis present

## 2016-12-29 DIAGNOSIS — G92 Toxic encephalopathy: Principal | ICD-10-CM | POA: Diagnosis present

## 2016-12-29 DIAGNOSIS — A419 Sepsis, unspecified organism: Secondary | ICD-10-CM | POA: Diagnosis present

## 2016-12-29 DIAGNOSIS — G9341 Metabolic encephalopathy: Secondary | ICD-10-CM | POA: Diagnosis present

## 2016-12-29 DIAGNOSIS — M549 Dorsalgia, unspecified: Secondary | ICD-10-CM | POA: Diagnosis present

## 2016-12-29 DIAGNOSIS — F1410 Cocaine abuse, uncomplicated: Secondary | ICD-10-CM | POA: Diagnosis present

## 2016-12-29 DIAGNOSIS — F1110 Opioid abuse, uncomplicated: Secondary | ICD-10-CM | POA: Diagnosis present

## 2016-12-29 DIAGNOSIS — F1010 Alcohol abuse, uncomplicated: Secondary | ICD-10-CM | POA: Diagnosis present

## 2016-12-29 DIAGNOSIS — R945 Abnormal results of liver function studies: Secondary | ICD-10-CM | POA: Diagnosis present

## 2016-12-29 DIAGNOSIS — F1910 Other psychoactive substance abuse, uncomplicated: Secondary | ICD-10-CM

## 2016-12-29 MED ORDER — PIPERACILLIN-TAZOBACTAM 3.375 G IVPB 30 MIN
3.3750 g | Freq: Once | INTRAVENOUS | Status: AC
  Administered 2016-12-30: 3.375 g via INTRAVENOUS
  Filled 2016-12-29: qty 50

## 2016-12-29 MED ORDER — SODIUM CHLORIDE 0.9 % IV BOLUS (SEPSIS)
1000.0000 mL | Freq: Once | INTRAVENOUS | Status: AC
  Administered 2016-12-30: 1000 mL via INTRAVENOUS

## 2016-12-29 MED ORDER — SODIUM CHLORIDE 0.9 % IV BOLUS (SEPSIS)
500.0000 mL | Freq: Once | INTRAVENOUS | Status: AC
  Administered 2016-12-30: 500 mL via INTRAVENOUS

## 2016-12-29 MED ORDER — VANCOMYCIN HCL IN DEXTROSE 1-5 GM/200ML-% IV SOLN
1000.0000 mg | Freq: Once | INTRAVENOUS | Status: AC
  Administered 2016-12-30: 1000 mg via INTRAVENOUS
  Filled 2016-12-29: qty 200

## 2016-12-29 NOTE — ED Provider Notes (Signed)
TIME SEEN: 11:46 PM  CHIEF COMPLAINT: altered mental status  HPI: Pt is a 31 y.o. male with history of hypertension and IV heroin abuse who presents to the emergency department after he was found confused in a parking lot. Patient is unable to provide any details. Found to be febrile here. He denies cough, vomiting or diarrhea. Denies headache, neck pain, chest pain, abdominal pain. No rash. Denies sick contacts or recent travel. Was hospitalized in August for cellulitis. He states he is not on antibiotics. Does endorse alcohol use today. Denies drug use.  ROS: See HPI Constitutional:  fever  Eyes: no drainage  ENT: no runny nose   Cardiovascular:  no chest pain  Resp: no SOB  GI: no vomiting GU: no dysuria Integumentary: no rash  Allergy: no hives  Musculoskeletal: no leg swelling  Neurological: no slurred speech ROS otherwise negative  PAST MEDICAL HISTORY/PAST SURGICAL HISTORY:  Past Medical History:  Diagnosis Date  . Heroin abuse   . Hypertension   . IV drug user   . Overdose     MEDICATIONS:  Prior to Admission medications   Medication Sig Start Date End Date Taking? Authorizing Provider  thiamine 100 MG tablet Take 1 tablet (100 mg total) by mouth daily. Patient not taking: Reported on 12/29/2016 11/24/16   Richarda Overlie, MD    ALLERGIES:  No Known Allergies  SOCIAL HISTORY:  Social History  Substance Use Topics  . Smoking status: Never Smoker  . Smokeless tobacco: Never Used  . Alcohol use Yes    FAMILY HISTORY: History reviewed. No pertinent family history.  EXAM: BP 100/73 (BP Location: Left Arm)   Pulse (!) 126   Temp 100.2 F (37.9 C) (Oral)   Resp (!) 22   SpO2 92%  CONSTITUTIONAL: Alert and oriented3 but drowsy but is arousable to voice. Chronically ill-appearing, febrile, does not answer many questions HEAD: Normocephalic, atraumatic EYES: Conjunctivae clear, pupils appear equal, EOMI ENT: normal nose; moist mucous membranes NECK: Supple, no  meningismus, no nuchal rigidity, no LAD  CARD: regular and tachycardic; S1 and S2 appreciated; no murmurs, no clicks, no rubs, no gallops RESP: Normal chest excursion without splinting, mildly tachypneic; breath sounds clear and equal bilaterally; no wheezes, no rhonchi, no rales, no hypoxia or respiratory distress, speaking full sentences ABD/GI: Normal bowel sounds; non-distended; soft, non-tender, no rebound, no guarding, no peritoneal signs, no hepatosplenomegaly BACK:  The back appears normal and is non-tender to palpation, there is no CVA tenderness EXT: Normal ROM in all joints; non-tender to palpation; no edema; normal capillary refill; no cyanosis, no calf tenderness or swelling    SKIN: Normal color for age and race; warm; no rash; no sign of cellulitis NEURO: Moves all extremities equally, reports normal sensation diffusely, cranial nerves II through XII intact, normal speech PSYCH: The patient's mood and manner are appropriate. Grooming and personal hygiene are appropriate.  MEDICAL DECISION MAKING: Patient here with altered mental status. He is febrile, tachycardic and tachypneic. He denies any pain at this time. No sign of trauma on exam. Patient is asking to leave but appears intoxicated. I do not feel he is safe to go at this time. He refuses admission. I have placed him under a 24-hour hold and will reassess him when he is clinically sober. Will obtain labs, cultures, urine, chest x-ray. He has no focal neurologic deficits on exam. He is drowsy but is arousable to voice.  ED PROGRESS: Pt's labs show leukocytosis of 14.6 with left shift.  AST and ALT are elevated which is likely chronic from a drug and alcohol abuse. Normal lactate. Alcohol level is 34. Patient refusing rectal temperature. We are waiting on a urinalysis. He has received IV fluids and broad-spectrum antibiotics.  3:45 AM  Pt under a 48 hour hold at this time. His urine shows no sign of infection. Drugs and positive for  opiates, cocaine and amphetamines. I feel he needs admission to the hospital.  I do not feel this time he has capacity to make the decision to leave. I'm concerned for sepsis and possible bacteremia given his history of IV drug abuse. Also at risk for endocarditis. I do not think this is meningitis. He has no headache, meningismus is neurologically intact at this time.   3:58 AM Discussed patient's case with hospitalist, Dr. Clyde Lundborg.  I have recommended admission and patient (and family if present) agree with this plan. Admitting physician will place admission orders.   I reviewed all nursing notes, vitals, pertinent previous records, EKGs, lab and urine results, imaging (as available).     EKG Interpretation  Date/Time:  Wednesday December 30 2016 00:11:05 EDT Ventricular Rate:  108 PR Interval:    QRS Duration: 107 QT Interval:  324 QTC Calculation: 435 R Axis:   39 Text Interpretation:  Sinus tachycardia RSR' in V1 or V2, right VCD or RVH ST elev, probable normal early repol pattern No significant change since last tracing Confirmed by Dwayna Kentner, Baxter Hire [phone]) on 12/30/2016 12:36:33 AM         CRITICAL CARE Performed by: Raelyn Number   Total critical care time: 45 minutes  Critical care time was exclusive of separately billable procedures and treating other patients.  Critical care was necessary to treat or prevent imminent or life-threatening deterioration.  Critical care was time spent personally by me on the following activities: development of treatment plan with patient and/or surrogate as well as nursing, discussions with consultants, evaluation of patient's response to treatment, examination of patient, obtaining history from patient or surrogate, ordering and performing treatments and interventions, ordering and review of laboratory studies, ordering and review of radiographic studies, pulse oximetry and re-evaluation of patient's condition.    Twana Wileman, Layla Maw,  DO 12/30/16 [phone]

## 2016-12-29 NOTE — ED Triage Notes (Signed)
Pt brought in by EMS after he was found laying supine in the [employer] parking lot  Police on scene called EMS due to pt's behavior  Pt able to tell name and birthday only  Pt smells of alcohol  Able to walk with assistance

## 2016-12-29 NOTE — Progress Notes (Signed)
Pharmacy - Brief Note  A consult was received from an ED physician for vancomycin and zosyn per pharmacy dosing.  The patient's profile has been reviewed for ht/wt/allergies/indication/available labs.   Continue with the one-time vancomycin and zosyn orders.  Further antibiotics/pharmacy consults should be ordered by admitting physician if indicated.                       Thank you,  Juliette Alcide, PharmD, BCPS.   Pager: [phone] 12/29/2016 11:51 PM

## 2016-12-30 ENCOUNTER — Inpatient Hospital Stay (HOSPITAL_COMMUNITY): Payer: Self-pay

## 2016-12-30 DIAGNOSIS — M545 Low back pain: Secondary | ICD-10-CM

## 2016-12-30 DIAGNOSIS — M549 Dorsalgia, unspecified: Secondary | ICD-10-CM | POA: Diagnosis present

## 2016-12-30 DIAGNOSIS — F1910 Other psychoactive substance abuse, uncomplicated: Secondary | ICD-10-CM | POA: Insufficient documentation

## 2016-12-30 DIAGNOSIS — R945 Abnormal results of liver function studies: Secondary | ICD-10-CM | POA: Diagnosis present

## 2016-12-30 DIAGNOSIS — F1410 Cocaine abuse, uncomplicated: Secondary | ICD-10-CM

## 2016-12-30 DIAGNOSIS — R7989 Other specified abnormal findings of blood chemistry: Secondary | ICD-10-CM | POA: Diagnosis present

## 2016-12-30 DIAGNOSIS — F1110 Opioid abuse, uncomplicated: Secondary | ICD-10-CM

## 2016-12-30 DIAGNOSIS — A419 Sepsis, unspecified organism: Secondary | ICD-10-CM

## 2016-12-30 LAB — COMPREHENSIVE METABOLIC PANEL
ALBUMIN: 4 g/dL (ref 3.5–5.0)
ALK PHOS: 89 U/L (ref 38–126)
ALT: 211 U/L — AB (ref 17–63)
AST: 177 U/L — AB (ref 15–41)
Anion gap: 12 (ref 5–15)
BILIRUBIN TOTAL: 0.7 mg/dL (ref 0.3–1.2)
BUN: 17 mg/dL (ref 6–20)
CALCIUM: 9.1 mg/dL (ref 8.9–10.3)
CO2: 23 mmol/L (ref 22–32)
CREATININE: 0.98 mg/dL (ref 0.61–1.24)
Chloride: 104 mmol/L (ref 101–111)
GFR calc Af Amer: >60 mL/min (ref >60)
GFR calc non Af Amer: >60 mL/min (ref >60)
Glucose, Bld: 110 mg/dL — ABNORMAL HIGH (ref 65–99)
Potassium: 4.2 mmol/L (ref 3.5–5.1)
Sodium: 139 mmol/L (ref 135–145)
Total Protein: 7.9 g/dL (ref 6.5–8.1)

## 2016-12-30 LAB — RAPID URINE DRUG SCREEN, HOSP PERFORMED
Amphetamines: POSITIVE — AB
Barbiturates: NOT DETECTED
Benzodiazepines: NOT DETECTED
Cocaine: POSITIVE — AB
Opiates: POSITIVE — AB
Tetrahydrocannabinol: NOT DETECTED

## 2016-12-30 LAB — URINALYSIS, ROUTINE W REFLEX MICROSCOPIC
Bilirubin Urine: NEGATIVE
GLUCOSE, UA: NEGATIVE mg/dL
HGB URINE DIPSTICK: NEGATIVE
Ketones, ur: NEGATIVE mg/dL
Leukocytes, UA: NEGATIVE
Nitrite: NEGATIVE
Protein, ur: NEGATIVE mg/dL
SPECIFIC GRAVITY, URINE: 1.021 (ref 1.005–1.030)
pH: 5 (ref 5.0–8.0)

## 2016-12-30 LAB — CBC WITH DIFFERENTIAL/PLATELET
Basophils Absolute: 0 10*3/uL (ref 0.0–0.1)
Basophils Relative: 0 %
Eosinophils Absolute: 0 10*3/uL (ref 0.0–0.7)
Eosinophils Relative: 0 %
HEMATOCRIT: 42.1 % (ref 39.0–52.0)
HEMOGLOBIN: 14.2 g/dL (ref 13.0–17.0)
LYMPHS ABS: 1.4 10*3/uL (ref 0.7–4.0)
LYMPHS PCT: 10 %
MCH: 29.9 pg (ref 26.0–34.0)
MCHC: 33.7 g/dL (ref 30.0–36.0)
MCV: 88.6 fL (ref 78.0–100.0)
MONOS PCT: 5 %
Monocytes Absolute: 0.7 10*3/uL (ref 0.1–1.0)
NEUTROS ABS: 12.4 10*3/uL — AB (ref 1.7–7.7)
NEUTROS PCT: 85 %
Platelets: 255 10*3/uL (ref 150–400)
RBC: 4.75 MIL/uL (ref 4.22–5.81)
RDW: 13.9 % (ref 11.5–15.5)
WBC: 14.6 10*3/uL — ABNORMAL HIGH (ref 4.0–10.5)

## 2016-12-30 LAB — I-STAT CG4 LACTIC ACID, ED: Lactic Acid, Venous: 1.66 mmol/L (ref 0.5–1.9)

## 2016-12-30 LAB — GLUCOSE, CAPILLARY: GLUCOSE-CAPILLARY: 114 mg/dL — AB (ref 65–99)

## 2016-12-30 LAB — PROTIME-INR
INR: 1.12
PROTHROMBIN TIME: 14.3 s (ref 11.4–15.2)

## 2016-12-30 LAB — SEDIMENTATION RATE: Sed Rate: 14 mm/hr (ref 0–16)

## 2016-12-30 LAB — ACETAMINOPHEN LEVEL

## 2016-12-30 LAB — PROCALCITONIN: Procalcitonin: 7.27 ng/mL

## 2016-12-30 LAB — LACTIC ACID, PLASMA: LACTIC ACID, VENOUS: 1 mmol/L (ref 0.5–1.9)

## 2016-12-30 LAB — ETHANOL: ALCOHOL ETHYL (B): 34 mg/dL — AB (ref <5)

## 2016-12-30 MED ORDER — LORAZEPAM 1 MG PO TABS: 1.0000 mg | ORAL_TABLET | Freq: Four times a day (QID) | ORAL | Status: DC | PRN

## 2016-12-30 MED ORDER — IBUPROFEN 200 MG PO TABS
400.0000 mg | ORAL_TABLET | Freq: Four times a day (QID) | ORAL | Status: DC | PRN
  Administered 2016-12-30 – 2016-12-31 (×2): 400 mg via ORAL
  Filled 2016-12-30 (×2): qty 2

## 2016-12-30 MED ORDER — ADULT MULTIVITAMIN W/MINERALS CH
1.0000 | ORAL_TABLET | Freq: Every day | ORAL | Status: DC
  Administered 2016-12-30 – 2017-01-01 (×3): 1 via ORAL
  Filled 2016-12-30 (×3): qty 1

## 2016-12-30 MED ORDER — LORAZEPAM 2 MG/ML IJ SOLN
0.0000 mg | Freq: Two times a day (BID) | INTRAMUSCULAR | Status: DC
  Filled 2016-12-30: qty 2

## 2016-12-30 MED ORDER — ONDANSETRON HCL 4 MG/2ML IJ SOLN
4.0000 mg | Freq: Three times a day (TID) | INTRAMUSCULAR | Status: DC | PRN
Start: 2016-12-30 — End: 2017-01-01
  Administered 2016-12-30 – 2016-12-31 (×2): 4 mg via INTRAVENOUS
  Filled 2016-12-30 (×2): qty 2

## 2016-12-30 MED ORDER — LORAZEPAM 2 MG/ML IJ SOLN
0.0000 mg | Freq: Four times a day (QID) | INTRAMUSCULAR | Status: AC
  Administered 2016-12-30: 2 mg via INTRAVENOUS
  Administered 2016-12-31: 4 mg via INTRAVENOUS
  Administered 2016-12-31 (×2): 2 mg via INTRAVENOUS
  Administered 2017-01-01: 4 mg via INTRAVENOUS
  Filled 2016-12-30: qty 2
  Filled 2016-12-30 (×6): qty 1

## 2016-12-30 MED ORDER — ZOLPIDEM TARTRATE 5 MG PO TABS: 5.0000 mg | ORAL_TABLET | Freq: Every evening | ORAL | Status: DC | PRN

## 2016-12-30 MED ORDER — PIPERACILLIN-TAZOBACTAM 3.375 G IVPB
3.3750 g | Freq: Three times a day (TID) | INTRAVENOUS | Status: DC
  Administered 2016-12-30 – 2016-12-31 (×4): 3.375 g via INTRAVENOUS
  Filled 2016-12-30 (×5): qty 50

## 2016-12-30 MED ORDER — HYDRALAZINE HCL 20 MG/ML IJ SOLN: 5.0000 mg | INTRAMUSCULAR | Status: DC | PRN

## 2016-12-30 MED ORDER — ENOXAPARIN SODIUM 40 MG/0.4ML ~~LOC~~ SOLN
40.0000 mg | SUBCUTANEOUS | Status: DC
  Administered 2016-12-30 – 2016-12-31 (×2): 40 mg via SUBCUTANEOUS
  Filled 2016-12-30 (×3): qty 0.4

## 2016-12-30 MED ORDER — PIPERACILLIN-TAZOBACTAM 3.375 G IVPB 30 MIN
INTRAVENOUS | Status: AC
  Filled 2016-12-30: qty 50

## 2016-12-30 MED ORDER — VITAMIN B-1 100 MG PO TABS
100.0000 mg | ORAL_TABLET | Freq: Every day | ORAL | Status: DC
  Administered 2016-12-30 – 2017-01-01 (×3): 100 mg via ORAL
  Filled 2016-12-30 (×3): qty 1

## 2016-12-30 MED ORDER — LORAZEPAM 2 MG/ML IJ SOLN
1.0000 mg | Freq: Four times a day (QID) | INTRAMUSCULAR | Status: DC | PRN
  Administered 2016-12-30: 1 mg via INTRAVENOUS
  Filled 2016-12-30: qty 1

## 2016-12-30 MED ORDER — VANCOMYCIN HCL IN DEXTROSE 1-5 GM/200ML-% IV SOLN
1000.0000 mg | Freq: Three times a day (TID) | INTRAVENOUS | Status: DC
  Administered 2016-12-30 – 2016-12-31 (×4): 1000 mg via INTRAVENOUS
  Filled 2016-12-30 (×4): qty 200

## 2016-12-30 MED ORDER — FOLIC ACID 1 MG PO TABS
1.0000 mg | ORAL_TABLET | Freq: Every day | ORAL | Status: DC
  Administered 2016-12-30 – 2017-01-01 (×3): 1 mg via ORAL
  Filled 2016-12-30 (×3): qty 1

## 2016-12-30 MED ORDER — THIAMINE HCL 100 MG/ML IJ SOLN
100.0000 mg | Freq: Every day | INTRAMUSCULAR | Status: DC
  Filled 2016-12-30: qty 2

## 2016-12-30 MED ORDER — SODIUM CHLORIDE 0.9 % IV SOLN
INTRAVENOUS | Status: DC
  Administered 2016-12-30: 06:00:00 via INTRAVENOUS

## 2016-12-30 NOTE — ED Notes (Signed)
Pt. Documented in error DG Chest 2 View Completed.

## 2016-12-30 NOTE — Progress Notes (Signed)
Attempted echo at 2:37 pm. Patient refusing at time and asking for food.

## 2016-12-30 NOTE — Progress Notes (Signed)
Pharmacy Antibiotic Note  Grant Tapia is a 31 y.o. male admitted on 12/29/2016 with sepsis.  Pharmacy has been consulted for vancomycin and zosyn dosing. Sepsis of unknown source. Patient is IVDA found confused in [employer] parking lot.   Today, 12/30/2016   Renal: SCr WNL  Tm 100.2  Plan:  Vancomycin 1gm IV q8h  Zosyn 3.375gm IV q8h over 4h infusion  Daily SCr  Check trough at steady state  F/u ability to narrow antibiotics - await cultures    Temp (24hrs), Avg:100.2 F (37.9 C), Min:100.2 F (37.9 C), Max:100.2 F (37.9 C)   Recent Labs Lab 12/29/16 2355 12/30/16 0009  WBC 14.6*  --   CREATININE 0.98  --   LATICACIDVEN  --  1.66    CrCl cannot be calculated (Unknown ideal weight.).    No Known Allergies  Antimicrobials this admission: 9/19 vanco >> 9/19 zosyn >>  Dose adjustments this admission:   Microbiology results: 9/19 BCx:  9/19 UCx:    Thank you for allowing pharmacy to be a part of this patient's care.  Juliette Alcide, PharmD, BCPS.   Pager: [phone] 12/30/2016 5:38 AM

## 2016-12-30 NOTE — ED Notes (Signed)
Pt. Documented in error Document vital signs within 1-hour of fluid bolus completion and notify provider of bolus completion.

## 2016-12-30 NOTE — H&P (Signed)
History and Physical    Grant Tapia ZOX:[id number] DOB: 1986-03-23 DOA: 12/29/2016  Referring MD/NP/PA:   PCP: Patient, No Pcp Per   Patient coming from:  The patient is coming from home.  At baseline, pt is independent for most of ADL.   Chief Complaint: Altered mental status, back pain, left hand numbness, fever  HPI: Grant Tapia is a 31 y.o. male with medical history significant of hypertension, polysubstance abuse (heroin, alcohol, methamphetamine, cocaine), IV drug user, who presents with altered mental status, back pain, left hand numbness and fever.  Per report, pt was found confused with smells of alcohol in [place] parking lot. Police on scene called EMS. His mental status has gradually improved in ED. When I saw pt in ED, he is drowsy, but oriented 3. Patient reports lower back pain, which is constant, 8 out of 10 in severity, nonradiating. Denies weakness in legs. He reports that he has left hand numbness. No facial droop, slurred speech, vision change or hearing loss. No numbness in legs. No loss control for bladder or bowel movement. Patient denies nausea, vomiting, diarrhea, abdominal pain. No chest pain, shortness rest, cough. No symptoms of UTI. No neck rigidity.  ED Course: pt was found to have WBC 14.6, lactic acid 1.66, electrolytes renal function okay, abnormal liver functions with AST 177, ALT 211, total bilirubin 0.7, ALP 89, negative urinalysis, positive UDS for cocaine, opiates and amphetamines, temperature 100.2, tachycardia, tachypnea, oxygen saturation 92% on room air. Chest x-ray showed mild left basilar atelectasis. Patient is admitted to telemetry bed as inpatient. Pt is IVC'ed.  Review of Systems:   General: has fevers, no chills, no body weight gain, has fatigue HEENT: no blurry vision, hearing changes or sore throat Respiratory: no dyspnea, coughing, wheezing CV: no chest pain, no palpitations GI: no nausea, vomiting, abdominal pain,  diarrhea, constipation GU: no dysuria, burning on urination, increased urinary frequency, hematuria  Ext: no leg edema Neuro: has left hand numbness. Skin: no rash, no skin tear. MSK: has back pain Neurology: Has altered mental status and left hand numbness Heme: No easy bruising.  Travel history: No recent long distant travel.  Allergy: No Known Allergies  Past Medical History:  Diagnosis Date  . Heroin abuse   . Hypertension   . IV drug user   . Overdose     Past Surgical History:  Procedure Laterality Date  . I&D EXTREMITY Left 11/19/2016   Procedure: INCISION AND DRAINAGE LEFT ELBOW  ABSCESS;  Surgeon: Myrene Galas, MD;  Location: [facility] OR;  Service: Orthopedics;  Laterality: Left;    Social History:  reports that he has never smoked. He has never used smokeless tobacco. He reports that he drinks alcohol. He reports that he uses drugs, including IV.  Family History: Reviewed with patient, but patient does not know family history.  Prior to Admission medications   Medication Sig Start Date End Date Taking? Authorizing Provider  thiamine 100 MG tablet Take 1 tablet (100 mg total) by mouth daily. Patient not taking: Reported on 12/29/2016 11/24/16   Richarda Overlie, MD    Physical Exam: Vitals:   12/29/16 2253 12/29/16 2255 12/30/16 0219  BP:  100/73 109/64  Pulse:  (!) 126 95  Resp:  (!) 22 13  Temp:  100.2 F (37.9 C)   TempSrc:  Oral   SpO2: 98% 92% 96%   General: Not in acute distress HEENT:       Eyes: PERRL, EOMI, no scleral icterus.  ENT: No discharge from the ears and nose, no pharynx injection, no tonsillar enlargement.        Neck: No JVD, no bruit, no mass felt. Heme: No neck lymph node enlargement. Cardiac: S1/S2, RRR, No murmurs, No gallops or rubs. Respiratory: No rales, wheezing, rhonchi or rubs. GI: Soft, nondistended, nontender, no rebound pain, no organomegaly, BS present. GU: No hematuria Ext: No pitting leg edema bilaterally. 2+DP/PT pulse  bilaterally. Musculoskeletal: No joint deformities, No joint redness or warmth, no limitation of ROM in spin. Skin: No rashes.  Neuro: Drowsy, oriented X3, cranial nerves II-XII grossly intact, moves all extremities normally. Muscle strength 5/5 in all extremities, sensation to light touch intact. Brachial reflex 2+ bilaterally. Negative Babinski's sign. Psych: Patient is not psychotic, no suicidal or hemocidal ideation.  Labs on Admission: I have personally reviewed following labs and imaging studies  CBC:  Recent Labs Lab 12/29/16 2355  WBC 14.6*  NEUTROABS 12.4*  HGB 14.2  HCT 42.1  MCV 88.6  PLT 255   Basic Metabolic Panel:  Recent Labs Lab 12/29/16 2355  NA 139  K 4.2  CL 104  CO2 23  GLUCOSE 110*  BUN 17  CREATININE 0.98  CALCIUM 9.1   GFR: CrCl cannot be calculated (Unknown ideal weight.). Liver Function Tests:  Recent Labs Lab 12/29/16 2355  AST 177*  ALT 211*  ALKPHOS 89  BILITOT 0.7  PROT 7.9  ALBUMIN 4.0   No results for input(s): LIPASE, AMYLASE in the last 168 hours. No results for input(s): AMMONIA in the last 168 hours. Coagulation Profile: No results for input(s): INR, PROTIME in the last 168 hours. Cardiac Enzymes: No results for input(s): CKTOTAL, CKMB, CKMBINDEX, TROPONINI in the last 168 hours. BNP (last 3 results) No results for input(s): PROBNP in the last 8760 hours. HbA1C: No results for input(s): HGBA1C in the last 72 hours. CBG: No results for input(s): GLUCAP in the last 168 hours. Lipid Profile: No results for input(s): CHOL, HDL, LDLCALC, TRIG, CHOLHDL, LDLDIRECT in the last 72 hours. Thyroid Function Tests: No results for input(s): TSH, T4TOTAL, FREET4, T3FREE, THYROIDAB in the last 72 hours. Anemia Panel: No results for input(s): VITAMINB12, FOLATE, FERRITIN, TIBC, IRON, RETICCTPCT in the last 72 hours. Urine analysis:    Component Value Date/Time   COLORURINE YELLOW 12/30/2016 0215   APPEARANCEUR CLEAR 12/30/2016  0215   LABSPEC 1.021 12/30/2016 0215   PHURINE 5.0 12/30/2016 0215   GLUCOSEU NEGATIVE 12/30/2016 0215   HGBUR NEGATIVE 12/30/2016 0215   BILIRUBINUR NEGATIVE 12/30/2016 0215   KETONESUR NEGATIVE 12/30/2016 0215   PROTEINUR NEGATIVE 12/30/2016 0215   UROBILINOGEN 0.2 03/26/2014 2255   NITRITE NEGATIVE 12/30/2016 0215   LEUKOCYTESUR NEGATIVE 12/30/2016 0215   Sepsis Labs: (procalcitonin:4,lacticidven:4) )No results found for this or any previous visit (from the past 240 hour(s)).   Radiological Exams on Admission: Dg Chest 2 View  Result Date: 12/29/2016 CLINICAL DATA:  Fever, found unconscious EXAM: CHEST  2 VIEW COMPARISON:  07/20/2016 FINDINGS: Mild atelectasis at the left base. No consolidation or effusion. Normal cardiomediastinal silhouette. No pneumothorax. IMPRESSION: Mild left basilar atelectasis.  No acute infiltrate or edema. Electronically Signed   By: Jasmine Pang M.D.   On: 12/29/2016 23:25     EKG: Independently reviewed. Sinus rhythm, QTC 435, early R-wave progression.  Assessment/Plan Principal Problem:   Sepsis (HCC) Active Problems:   Alcohol abuse   Heroin abuse   Cocaine abuse   Acute metabolic encephalopathy   Back pain  Abnormal LFTs   Sepsis [facility]): Patient meets criteria for sepsis with leukocytosis, fever, tachycardia and tachypnea. Lactic acid is normal. Hemodynamically stable. Etiology is not clear. Urinalysis negative. Chest x-ray did not show infiltration. He has history of IV drug use, indicating possible bacteremia. Patient complains back pain and left hand numbness, will need to rule out spinal abscesses. No meningeal sign. Neck is supple. Mental status is improving, less likely to have meningitis.  -will admit to tele bed as inpt -start IV vancomycin and Zosyn -Follow-up blood culture and urine culture -will get Procalcitonin and trend lactic acid levels per sepsis protocol. -IVF: 2.5L of NS bolus in ED, followed by 125 cc/h    -will get MRI of C-, T- and L spine  Abnormal LFTs: AST 177, ALT 211, total bilirubin 0.7, ALP 89. Etiology is not clear. Alcohol abuse may have partially contributed. -check tylenol level -check hepatitis panel and HIV antibody -Avoid using Tylenol  Acute metabolic encephalopathy: Likely multifactorial etiology, including polysubstance abuse and sepsis -Treat underlying issues as above -Frequent neuro check  Polysubstance abuse:  Alcohol abuse, Heroin abuse and Cocaine abuse. UDS positive for cocaine, opiates and amphetamines, -Needed constantly when patient mental status, back to normal -CiWA protocol    DVT ppx: SQ Lovenox Code Status: Full code Family Communication: None at bed side.   Disposition Plan:  Anticipate discharge back to previous home environment Consults called:  none Admission status: Inpatient/tele     Date of Service 12/30/2016    Lorretta Harp [facility] Pager [phone]  If 7PM-7AM, please contact night-coverage [email or web address] Password [email or web address] 12/30/2016, 5:30 AM

## 2016-12-30 NOTE — ED Notes (Signed)
Call report to [facility] [phone] @ [phone]

## 2016-12-30 NOTE — ED Notes (Signed)
Patient refused rectal temperature, EDP notified.

## 2016-12-30 NOTE — ED Notes (Signed)
Patient refused rectal Temperature. Patient attempting to obtain a urine sample.

## 2016-12-30 NOTE — Progress Notes (Signed)
   Progress Note    Grant Tapia  MRN:[id number] DOB: 05/29/1985  DOA: 12/29/2016 PCP: Patient, No Pcp Per    Brief Narrative:   Chief complaint: Altered mental status, back pain, left hand numbness and fever  Medical records reviewed and are as summarized below:  Grant Tapia is an 31 y.o. male with a PMH of polysubstance abuse including heroin, alcohol, methamphetamine and cocaine who was admitted 12/29/16 with altered mental status, back pain, left hand numbness and fever. He was found confused in a [employer] parking lot and was subsequently brought to the hospital by EMS.  Assessment/Plan:   Principal Problem:   Sepsis (HCC) with back pain Patient had leukocytosis, fever, tachycardia and tachypnea on admission concerning for sepsis. No obvious source given negative urinalysis, negative chest x-ray. Lactic acid is not elevated however Pro calcitonin was markedly elevated at 7.27. Given his back pain, he may have osteomyelitis of the spine. Unfortunately, could not tolerate an MRI. Blood cultures have been sent, but he refused a 2-D echocardiogram. Continue empiric vancomycin and Zosyn.Given that he could not tolerate MRI, will start with plain films and check an ESR. If he is bacteremic, will need MRI under anesthesia. Has had 3 hospital admissions in the past 6 months.  Active Problems:   Alcohol abuse Continue detox protocol with Ativan. Score currently 0.    Heroin abuse/Cocaine abuse SW consult. Has had 3 hospitalizations in the past (cellulitis/abscess, drug OD, delirium).    Acute metabolic encephalopathy Secondary to toxic effects of substances. Improved.    Abnormal LFTs Likely from alcohol/drugs.  Family Communication/Anticipated D/C date and plan/Code Status   DVT prophylaxis: Lovenox ordered. Code Status: Full Code.  Family Communication: No family at the bedside. Disposition Plan: Home when workup completed. Suspect he may have an underlying  serious infection that will warrant prolonged course of IV antibiotics and may be here for several more days.  Medical Consultants:    None.   Anti-Infectives:    Vancomycin 12/30/16--->  Zosyn 12/30/16--->  Subjective:   Lethargic. Mumbles when I try to engage him and does not interact with me.  Objective:    Vitals:   12/29/16 2255 12/30/16 0219 12/30/16 0611 12/30/16 1155  BP: 100/73 109/64 (!) 112/45 117/60  Pulse: (!) 126 95 90 100  Resp: (!) 22 13 16 16  Temp: 100.2 F (37.9 C)  98.3 F (36.8 C) 98.4 F (36.9 C)  TempSrc: Oral  Oral Axillary  SpO2: 92% 96% 96% 98%  Weight:   78.4 kg (172 lb 13.5 oz)   Height:   5' 11" (1.803 m)     Intake/Output Summary (Last 24 hours) at 12/30/16 1537 Last data filed at 12/30/16 1500  Gross per 24 hour  Intake          1839.58 ml  Output                0 ml  Net          1839.58 ml   Filed Weights   12/30/16 0611  Weight: 78.4 kg (172 lb 13.5 oz)    Exam: General: No acute distress. Cardiovascular: Heart sounds show a regular rate, and rhythm. No gallops or rubs. II/VI murmurs. No JVD. Lungs: Clear to auscultation bilaterally with good air movement. No rales, rhonchi or wheezes. Abdomen: Soft, nontender, nondistended with normal active bowel sounds. No masses. No hepatosplenomegaly. Neurological: Lethargic. Not cooperative with exam. Skin: Warm and dry. No rashes or lesions.   Extremities: No clubbing or cyanosis. No edema. Pedal pulses 2+. Psychiatric: Mood and affect are flat. Insight and judgment are poor.   Data Reviewed:   I have personally reviewed following labs and imaging studies:  Labs: Labs show the following: Na 139, K 4.2, CL 104, CO2 23, glucose 110, BUN 17, creatinine 0.98, calcium 9.1, AST 177, ALT 211, alkaline phosphate 211, bilirubin 0.7, protein 7.9, albumin 4.0, INR 1.12, WBC 14.6, hemoglobin 14.2, Hct 42.1, platelets 255, lactic acid 1.0, procalcitonin 7.27.   Microbiology No results found  for this or any previous visit (from the past 240 hour(s)).  Procedures and diagnostic studies:  Dg Chest 2 View  Result Date: 12/29/2016 CLINICAL DATA:  Fever, found unconscious EXAM: CHEST  2 VIEW COMPARISON:  07/20/2016 FINDINGS: Mild atelectasis at the left base. No consolidation or effusion. Normal cardiomediastinal silhouette. No pneumothorax. IMPRESSION: Mild left basilar atelectasis.  No acute infiltrate or edema. Electronically Signed   By: Donavan Foil M.D.   On: 12/29/2016 23:25   Grant Tapia  Result Date: 12/30/2016 CLINICAL DATA:  Altered mental status with back pain and left hand numbness. Fever and sepsis. EXAM: MRI CERVICAL SPINE WITHOUT Tapia TECHNIQUE: Multiplanar, multisequence Grant imaging of the cervical spine was performed. No intravenous Tapia was administered. COMPARISON:  03/27/2014 FINDINGS: Only sagittal motion degraded acquisitions could be obtained before the patient terminated the scan. STIR sequence is negative for discitis or facet arthritis. There is no evidence of cord impingement or cord signal abnormality. No noted degenerative changes or visible impingement. No paravertebral edema. IMPRESSION: 1. Incomplete and motion degraded study, only sagittal acquisitions could be obtained. 2. Negative for discitis or facet arthritis.  No cord impingement. Electronically Signed   By: Monte Fantasia M.D.   On: 12/30/2016 08:43    Medications:   . enoxaparin (LOVENOX) injection  40 mg Subcutaneous Q24H  . folic acid  1 mg Oral Daily  . LORazepam  0-4 mg Intravenous Q6H   Followed by  . [START ON 01/01/2017] LORazepam  0-4 mg Intravenous Q12H  . multivitamin with minerals  1 tablet Oral Daily  . thiamine  100 mg Oral Daily   Or  . thiamine  100 mg Intravenous Daily   Continuous Infusions: . sodium chloride 125 mL/hr at 12/30/16 0617  . piperacillin-tazobactam (ZOSYN)  IV 3.375 g (12/30/16 1200)  . vancomycin Stopped (12/30/16 1125)     LOS:  0 days   RAMA,CHRISTINA  [facility] Pager [phone]. If unable to reach me by pager, please call my cell phone at [phone].  *Please refer to [email or web address], password [email or web address] to get updated schedule on who will round on this patient, as hospitalists switch teams weekly. If 7PM-7AM, please contact night-coverage at [email or web address], password [email or web address] for any overnight needs.  12/30/2016, 3:37 PM

## 2016-12-31 ENCOUNTER — Inpatient Hospital Stay (HOSPITAL_COMMUNITY): Payer: Self-pay

## 2016-12-31 DIAGNOSIS — R509 Fever, unspecified: Secondary | ICD-10-CM

## 2016-12-31 LAB — HEPATITIS PANEL, ACUTE
HCV Ab: >11 s/co ratio — ABNORMAL HIGH (ref 0.0–0.9)
HEP B C IGM: NEGATIVE
HEP B S AG: NEGATIVE
Hep A IgM: NEGATIVE

## 2016-12-31 LAB — COMPREHENSIVE METABOLIC PANEL
ALT: 162 U/L — ABNORMAL HIGH (ref 17–63)
AST: 137 U/L — AB (ref 15–41)
Albumin: 3.3 g/dL — ABNORMAL LOW (ref 3.5–5.0)
Alkaline Phosphatase: 71 U/L (ref 38–126)
Anion gap: 6 (ref 5–15)
BUN: 17 mg/dL (ref 6–20)
CO2: 26 mmol/L (ref 22–32)
Calcium: 8.6 mg/dL — ABNORMAL LOW (ref 8.9–10.3)
Chloride: 108 mmol/L (ref 101–111)
Creatinine, Ser: 0.99 mg/dL (ref 0.61–1.24)
Glucose, Bld: 106 mg/dL — ABNORMAL HIGH (ref 65–99)
POTASSIUM: 4.1 mmol/L (ref 3.5–5.1)
Sodium: 140 mmol/L (ref 135–145)
Total Bilirubin: 0.5 mg/dL (ref 0.3–1.2)
Total Protein: 6.5 g/dL (ref 6.5–8.1)

## 2016-12-31 LAB — CBC
HEMATOCRIT: 38.9 % — AB (ref 39.0–52.0)
HEMOGLOBIN: 13 g/dL (ref 13.0–17.0)
MCH: 29.3 pg (ref 26.0–34.0)
MCHC: 33.4 g/dL (ref 30.0–36.0)
MCV: 87.8 fL (ref 78.0–100.0)
Platelets: 270 10*3/uL (ref 150–400)
RBC: 4.43 MIL/uL (ref 4.22–5.81)
RDW: 14.1 % (ref 11.5–15.5)
WBC: 8.5 10*3/uL (ref 4.0–10.5)

## 2016-12-31 LAB — ECHOCARDIOGRAM COMPLETE
HEIGHTINCHES: 71 in
WEIGHTICAEL: 2765.45 [oz_av]

## 2016-12-31 LAB — URINE CULTURE: CULTURE: NO GROWTH

## 2016-12-31 LAB — GLUCOSE, CAPILLARY: Glucose-Capillary: 109 mg/dL — ABNORMAL HIGH (ref 65–99)

## 2016-12-31 MED ORDER — KETOROLAC TROMETHAMINE 30 MG/ML IJ SOLN
30.0000 mg | Freq: Four times a day (QID) | INTRAMUSCULAR | Status: DC | PRN
  Administered 2016-12-31 – 2017-01-01 (×2): 30 mg via INTRAVENOUS
  Filled 2016-12-31 (×3): qty 1

## 2016-12-31 MED ORDER — BUPRENORPHINE HCL-NALOXONE HCL 2-0.5 MG SL SUBL
1.0000 | SUBLINGUAL_TABLET | Freq: Every day | SUBLINGUAL | Status: DC
  Administered 2016-12-31 – 2017-01-01 (×2): 1 via SUBLINGUAL
  Filled 2016-12-31 (×2): qty 1

## 2016-12-31 NOTE — Progress Notes (Signed)
  Echocardiogram 2D Echocardiogram has been performed.  Grant Tapia L Androw 12/31/2016, 10:27 AM

## 2016-12-31 NOTE — Progress Notes (Signed)
CSW notified by patient's RN that patient needs opiate withdrawal program that does suboxone treatment. CSW attempted to assess patient again, patient declined to speak with CSW. CSW inquired about patient's interest in substance abuse treatment options, patient declined. CSW signing off, patient declined assessment and resources. Please re-consult if new needs arise.   Grant Tapia, [place] Clinical Social Worker [facility] Cell#: [phone]

## 2016-12-31 NOTE — Progress Notes (Addendum)
Progress Note    Grant Tapia  HMC:[id number] DOB: 11-Jun-1985  DOA: 12/29/2016 PCP: Patient, No Pcp Per    Brief Narrative:   Chief complaint: Altered mental status, back pain, left hand numbness and fever  Medical records reviewed and are as summarized below:  Grant Tapia is an 31 y.o. male with a PMH of polysubstance abuse including heroin, alcohol, methamphetamine and cocaine who was admitted 12/29/16 with altered mental status, back pain, left hand numbness and fever. He was found confused in a [employer] parking lot and was subsequently brought to the hospital by EMS.  Assessment/Plan:   Principal Problem:   Sepsis ([place]) with back pain Patient had leukocytosis, fever, tachycardia and tachypnea on admission concerning for sepsis. No obvious source given negative urinalysis, negative chest x-ray. Lactic acid is not elevated however Pro calcitonin was markedly elevated at 7.27. Given his back pain, he may have osteomyelitis of the spine. Unfortunately, could not tolerate an MRI. ESR WNL and plain films of the cervical, thoracic and lumbar spine normal. If blood cultures negative, would not pursue further MRI screening at this time. 2 D Echo negative for vegetations.  Active Problems:   Alcohol abuse Continue detox protocol with Ativan. Score currently 2.    Heroin abuse/Cocaine abuse SW consult. Has had 3 hospitalizations in the past (cellulitis/abscess, drug OD, delirium). Suboxone therapy started w/ SW consult to obtain post hospitalization detox treatment.     Acute metabolic encephalopathy Secondary to toxic effects of substances. Improved.    Abnormal LFTs Likely from alcohol/drugs.  Family Communication/Anticipated D/C date and plan/Code Status   DVT prophylaxis: Lovenox ordered. Code Status: Full Code.  Family Communication: No family at the bedside. Disposition Plan: Home when blood cultures finalized as negative.  Medical Consultants:     None.   Anti-Infectives:    Vancomycin 12/30/16--->  Zosyn 12/30/16--->  Subjective:   Awake. Reports lower back pain.  Starting to feel like he is detoxing and feels "hot and cold", nauseated.  Objective:    Vitals:   12/30/16 1155 12/30/16 1548 12/31/16 0019 12/31/16 0658  BP: 117/60 128/79 (!) 144/87 118/83  Pulse: 100 98 72 72  Resp: 16 18    Temp: 98.4 F (36.9 C) (!) 97.5 F (36.4 C) 98.4 F (36.9 C) 98.7 F (37.1 C)  TempSrc: Axillary Axillary Oral Oral  SpO2: 98% 99% 100% 98%  Weight:      Height:        Intake/Output Summary (Last 24 hours) at 12/31/16 0846 Last data filed at 12/31/16 0600  Gross per 24 hour  Intake          3627.08 ml  Output                0 ml  Net          3627.08 ml   Filed Weights   12/30/16 0611  Weight: 78.4 kg (172 lb 13.5 oz)    Exam: General: No acute distress. Cardiovascular: Heart sounds show a regular rate, and rhythm. No gallops or rubs. No murmurs. No JVD. Lungs: Clear to auscultation bilaterally with good air movement. No rales, rhonchi or wheezes. Abdomen: Soft, nontender, nondistended with normal active bowel sounds. No masses. No hepatosplenomegaly. Skin: Warm and dry. No rashes or lesions. Tattoos. Extremities: No clubbing or cyanosis. No edema. Pedal pulses 2+. Psychiatric: Mood and affect are anxious. Insight and judgment are poor.   Data Reviewed:   I have personally reviewed following  labs and imaging studies:  Labs: Labs show the following: Na 139, K 4.2, CL 104, CO2 23, glucose 110, BUN 17, creatinine 0.98, calcium 9.1, AST 177, ALT 211, alkaline phosphate 211, bilirubin 0.7, protein 7.9, albumin 4.0, INR 1.12, WBC 14.6, hemoglobin 14.2, Hct 42.1, platelets 255, lactic acid 1.0, procalcitonin 7.27.   Microbiology Recent Results (from the past 240 hour(s))  Urine culture     Status: None   Collection Time: 12/30/16  2:15 AM  Result Value Ref Range Status   Specimen Description URINE, RANDOM   Final   Special Requests NONE  Final   Culture   Final    NO GROWTH Performed at [facility], [address], [place], [place] [address]    Report Status 12/31/2016 FINAL  Final    Procedures and diagnostic studies:  Dg Chest 2 View  Result Date: 12/29/2016 CLINICAL DATA:  Fever, found unconscious EXAM: CHEST  2 VIEW COMPARISON:  07/20/2016 FINDINGS: Mild atelectasis at the left base. No consolidation or effusion. Normal cardiomediastinal silhouette. No pneumothorax. IMPRESSION: Mild left basilar atelectasis.  No acute infiltrate or edema. Electronically Signed   By: Donavan Foil M.D.   On: 12/29/2016 23:25   Dg Cervical Spine 2 Or 3 Views  Result Date: 12/30/2016 CLINICAL DATA:  Polysubstance abuse. Back pain and left hand numbness. Fever. EXAM: CERVICAL SPINE - 2-3 VIEW COMPARISON:  MRI cervical spine 12/30/2016 FINDINGS: There is no evidence of cervical spine fracture or prevertebral soft tissue swelling. Alignment is normal. No endplate erosions. No other significant bone abnormalities are identified. IMPRESSION: Negative cervical spine radiographs. Electronically Signed   By: Lucienne Capers M.D.   On: 12/30/2016 22:05   Dg Thoracic Spine 2 View  Result Date: 12/30/2016 CLINICAL DATA:  Polysubstance abuse. Back pain, left hand numbness, and fever. EXAM: THORACIC SPINE 2 VIEWS COMPARISON:  Two-view chest 12/29/2016 FINDINGS: There is no evidence of thoracic spine fracture. Alignment is normal. No other significant bone abnormalities are identified. IMPRESSION: Negative. Electronically Signed   By: Lucienne Capers M.D.   On: 12/30/2016 22:04   Dg Lumbar Spine 2-3 Views  Result Date: 12/30/2016 CLINICAL DATA:  Polysubstance abuse. Admitted 12/29/2016 with altered mental status. Patient has back pain, left hand numbness, and fever. EXAM: LUMBAR SPINE - 2-3 VIEW COMPARISON:  None. FINDINGS: There is no evidence of lumbar spine fracture. Alignment is normal. Intervertebral disc  spaces are maintained. No endplate erosion is demonstrated. IMPRESSION: Negative. Electronically Signed   By: Lucienne Capers M.D.   On: 12/30/2016 22:03   Mr Cervical Spine Wo Contrast  Result Date: 12/30/2016 CLINICAL DATA:  Altered mental status with back pain and left hand numbness. Fever and sepsis. EXAM: MRI CERVICAL SPINE WITHOUT CONTRAST TECHNIQUE: Multiplanar, multisequence MR imaging of the cervical spine was performed. No intravenous contrast was administered. COMPARISON:  03/27/2014 FINDINGS: Only sagittal motion degraded acquisitions could be obtained before the patient terminated the scan. STIR sequence is negative for discitis or facet arthritis. There is no evidence of cord impingement or cord signal abnormality. No noted degenerative changes or visible impingement. No paravertebral edema. IMPRESSION: 1. Incomplete and motion degraded study, only sagittal acquisitions could be obtained. 2. Negative for discitis or facet arthritis.  No cord impingement. Electronically Signed   By: Monte Fantasia M.D.   On: 12/30/2016 08:43    Medications:   . enoxaparin (LOVENOX) injection  40 mg Subcutaneous Q24H  . folic acid  1 mg Oral Daily  . LORazepam  0-4 mg Intravenous Q6H   Followed by  . [START ON 01/01/2017] LORazepam  0-4 mg Intravenous Q12H  . multivitamin with minerals  1 tablet Oral Daily  . thiamine  100 mg Oral Daily   Or  . thiamine  100 mg Intravenous Daily   Continuous Infusions: . sodium chloride 125 mL/hr at 12/30/16 0617  . piperacillin-tazobactam (ZOSYN)  IV 3.375 g (12/31/16 0550)  . vancomycin Stopped (12/31/16 0346)     LOS: 1 day   Grant Tapia  [facility] Pager [phone]. If unable to reach me by pager, please call my cell phone at [phone].  *Please refer to [email or web address], password [email or web address] to get updated schedule on who will round on this patient, as hospitalists switch teams weekly. If 7PM-7AM, please contact night-coverage at [email or web address],  password [email or web address] for any overnight needs.  12/31/2016, 8:46 AM

## 2016-12-31 NOTE — Progress Notes (Signed)
CSW acknowledged consult for current substance abuse and attempted to assess patient. Patient declined noting that he just went to sleep. CSW will attempt to assess patient again later.   Celso Sickle, [place] Clinical Social Worker [facility] Cell#: [phone]

## 2016-12-31 NOTE — Progress Notes (Signed)
CSW will follow for an opiate withdrawal program that does suboxone treatment.

## 2016-12-31 NOTE — Progress Notes (Signed)
Pharmacy Antibiotic Note  Grant Tapia is a 31 y.o. IVDA male presented to the ED on  12/29/2016 after [employer] found him lying on ground in the [place] parking lot.  Patient was started on broad abx with vancomycin and zosyn on admission for suspected sepsis.  Today, 12/31/2016: - day #2 abx - with back pain, pt is being evaluated for r/o osteomyelitis - pt unable to tolerate spine MRI, spine Xrays with no signif findings - afeb, wbc down wnl, scr stable (crcl~100), PCT 7.27, LA down 1.12  Plan: - continue zosyn 3.375 gm IV q8h (infuse over 4 hrs) - continue vancomycin 1gm IV q8h. Will check steady state level prior to the 6pm dose today to assess current regimen - The combination of vancomycin and zosyn can be nephrotoxic.  Consider d/c prn ibuprofen to minimize this risk.  ____________________________________  Height:  (180.3 cm) Weight: 172 lb 13.5 oz (78.4 kg) IBW/kg (Calculated) : 75.3  Temp (24hrs), Avg:98.3 F (36.8 C), Min:97.5 F (36.4 C), Max:98.7 F (37.1 C)   Recent Labs Lab 12/29/16 2355 12/30/16 0009 12/30/16 0533 12/31/16 0431  WBC 14.6*  --   --  8.5  CREATININE 0.98  --   --  0.99  LATICACIDVEN  --  1.66 1.0  --     Estimated Creatinine Clearance: 118.3 mL/min (by C-G formula based on SCr of 0.99 mg/dL).    No Known Allergies  Antimicrobials this admission: 9/19 vanco >> 9/19 zosyn >>   Dose adjustments this admission: --    Microbiology results: 9/19 BCx x2:  9/19 UCx:  neg FINAL    Thank you for allowing pharmacy to be a part of this patient's care.  Lucia Gaskins 12/31/2016 11:28 AM

## 2017-01-01 DIAGNOSIS — F1910 Other psychoactive substance abuse, uncomplicated: Secondary | ICD-10-CM

## 2017-01-01 DIAGNOSIS — F1010 Alcohol abuse, uncomplicated: Secondary | ICD-10-CM

## 2017-01-01 DIAGNOSIS — G9341 Metabolic encephalopathy: Secondary | ICD-10-CM

## 2017-01-01 DIAGNOSIS — R945 Abnormal results of liver function studies: Secondary | ICD-10-CM

## 2017-01-01 LAB — GLUCOSE, CAPILLARY: GLUCOSE-CAPILLARY: 89 mg/dL (ref 65–99)

## 2017-01-01 MED ORDER — ADULT MULTIVITAMIN W/MINERALS CH
1.0000 | ORAL_TABLET | Freq: Every day | ORAL | 0 refills | Status: DC
Start: 2017-01-02 — End: 2017-05-08

## 2017-01-01 MED ORDER — IBUPROFEN 400 MG PO TABS: 400.0000 mg | ORAL_TABLET | Freq: Four times a day (QID) | ORAL | 0 refills | Status: DC | PRN

## 2017-01-01 NOTE — Progress Notes (Signed)
PHARMACIST - PHYSICIAN COMMUNICATION  DR:   TRH  CONCERNING: IV to Oral Route Change Policy  RECOMMENDATION: This patient is receiving Thiamine by the intravenous route.  Based on criteria approved by the Pharmacy and Therapeutics Committee, the intravenous medication(s) is/are being converted to the equivalent oral dose form(s).   DESCRIPTION: These criteria include:  The patient is eating (either orally or via tube) and/or has been taking other orally administered medications for a least 24 hours  The patient has no evidence of active gastrointestinal bleeding or impaired GI absorption (gastrectomy, short bowel, patient on TNA or NPO).  If you have questions about this conversion, please contact the Pharmacy Department    [phone] )  [facility]   [phone] )  [facility]   [phone] )  [facility]   [phone] )  [facility]   [phone] )  [facility]   Juliette Alcide [place], [facility] 01/01/2017 10:40 AM

## 2017-01-01 NOTE — Care Management Note (Signed)
Case Management Note  Patient Details  Name: Grant Tapia MRN: [id number] Date of Birth: Jan 04, 1986  Subjective/Objective:                    Action/Plan:d/c home.   Expected Discharge Date:  01/01/17               Expected Discharge Plan:  Home/Self Care  In-House Referral:  Clinical Social Work  Discharge planning Services  CM Consult  Post Acute Care Choice:    Choice offered to:     DME Arranged:    DME Agency:     HH Arranged:    HH Agency:     Status of Service:  Completed, signed off  If discussed at [employer], dates discussed:    Additional Comments:  Lanier Clam, RN 01/01/2017, 12:54 PM

## 2017-01-01 NOTE — Discharge Summary (Addendum)
Physician Discharge Summary  Grant Tapia:[id number] DOB: 07/19/1985 DOA: 12/29/2016  PCP: Patient, No Pcp Per  Admit date: 12/29/2016 Discharge date: 01/01/2017  Admitted From:home Disposition:home  Recommendations for Outpatient Follow-up:  1. Follow up with PCP in 1-2 weeks 2. Please obtain BMP/CBC in one week 3. Follow up final blood culture result  Home Health:no Equipment/Devices:no Discharge Condition:stable CODE STATUS:full code Diet recommendation:regular  Brief/Interim Summary: 31 year old male with history of polysubstance abuse, urine toxicology positive for heroin, alcohol, amphetamine and cocaine, admitted with altered mental status, back pain, left hand numbness and fever. He was found confused in [employer] parking lot and was subsequently brought to the hospital by EMS.  #Possible sepsis on admission, ruled out: Cultures negative. -Patient had leukocytosis, fever, tachycardia and tachypnea on admission concerning for sepsis. No obvious source. Urine negative. X-ray negative for pneumonia. No sign of infection including negative cultures. Antibiotics discontinued. Sedimentation rate not elevated. Given back pain x-ray of the back obtained which was negative. Echo negative for vegetation. Patient is a sking to go home today. He is afebrile and feels better.  #Alcohol abuse: Child psychotherapist was consulted. Patient was educated at the bedside. No sign of withdrawal.  #Polysubstance abuse including heroin and cocaine abuse: Patient refused to discuss outpatient resources with Child psychotherapist. I recommended patient to follow-up with PCP and outpatient detox program.  #Acute metabolic/toxic encephalopathy on admission due to substance abuse. Currently improved  #Transaminitis, chronic likely due to alcohol use. Hep C antibody positive. Recommended outpatient follow-up.  Patient was sleeping comfortable. Patient's wife at bedside. He was asking if he can go home  today. He feels good. Medically stable to transfer his care to outpatient. Recommended outpatient follow-up.  Discharge Diagnoses:  Principal Problem: Acute metabolic/toxic encephalopathy   Active Problems:   Alcohol abuse   Heroin abuse   Cocaine abuse      Back pain   Abnormal LFTs    Discharge Instructions  Discharge Instructions    Call MD for:  difficulty breathing, headache or visual disturbances    Complete by:  As directed    Call MD for:  extreme fatigue    Complete by:  As directed    Call MD for:  hives    Complete by:  As directed    Call MD for:  persistant dizziness or light-headedness    Complete by:  As directed    Call MD for:  persistant nausea and vomiting    Complete by:  As directed    Call MD for:  severe uncontrolled pain    Complete by:  As directed    Call MD for:  temperature >100.4    Complete by:  As directed    Diet general    Complete by:  As directed    Discharge instructions    Complete by:  As directed    Please follow up with your PCP in one week.   Increase activity slowly    Complete by:  As directed      Allergies as of 01/01/2017   No Known Allergies     Medication List    TAKE these medications   ibuprofen 400 MG tablet Commonly known as:  ADVIL,MOTRIN Take 1 tablet (400 mg total) by mouth every 6 (six) hours as needed for fever, headache or mild pain.   multivitamin with minerals Tabs tablet Take 1 tablet by mouth daily.   thiamine 100 MG tablet Take 1 tablet (100 mg total) by mouth daily.  Discharge Care Instructions        Start     Ordered   01/02/17 0000  Multiple Vitamin (MULTIVITAMIN WITH MINERALS) TABS tablet  Daily     01/01/17 1140   01/01/17 0000  ibuprofen (ADVIL,MOTRIN) 400 MG tablet  Every 6 hours PRN     01/01/17 1140   01/01/17 0000  Increase activity slowly     01/01/17 1140   01/01/17 0000  Discharge instructions    Comments:  Please follow up with your PCP in one week.    01/01/17 1140   01/01/17 0000  Diet general     01/01/17 1140   01/01/17 0000  Call MD for:  temperature >100.4     01/01/17 1140   01/01/17 0000  Call MD for:  persistant nausea and vomiting     01/01/17 1140   01/01/17 0000  Call MD for:  severe uncontrolled pain     01/01/17 1140   01/01/17 0000  Call MD for:  difficulty breathing, headache or visual disturbances     01/01/17 1140   01/01/17 0000  Call MD for:  hives     01/01/17 1140   01/01/17 0000  Call MD for:  persistant dizziness or light-headedness     01/01/17 1140   01/01/17 0000  Call MD for:  extreme fatigue     01/01/17 1140     Follow-up Information     [facility]. Schedule an appointment as soon as possible for a visit in 1 week(s).   Contact information: [address] [place] [address] [phone]         No Known Allergies  Consultations: None  Procedures/Studies: None  Subjective: Seen and examined at bedside. Denied headache, dizziness, nausea vomiting chest or shortness of breath. Wanted to go home today.  Discharge Exam: Vitals:   12/31/16 2337 01/01/17 0650  BP: (!) 142/90 (!) 133/93  Pulse: 84 64  Resp: 18 16  Temp: 98.8 F (37.1 C) 97.6 F (36.4 C)  SpO2: 99% 98%   Vitals:   12/31/16 0658 12/31/16 1502 12/31/16 2337 01/01/17 0650  BP: 118/83 138/77 (!) 142/90 (!) 133/93  Pulse: 72 86 84 64  Resp:  Temp: 98.7 F (37.1 C) 98.5 F (36.9 C) 98.8 F (37.1 C) 97.6 F (36.4 C)  TempSrc: Oral Oral Oral Oral  SpO2: 98% 100% 99% 98%  Weight:      Height:        General: Pt is alert, awake, not in acute distress Cardiovascular: RRR, S1/S2 +, no rubs, no gallops Respiratory: CTA bilaterally, no wheezing, no rhonchi Abdominal: Soft, NT, ND, bowel sounds + Extremities: no edema, no cyanosis    The results of significant diagnostics from this hospitalization (including imaging, microbiology, ancillary and laboratory)  are listed below for reference.     Microbiology: Recent Results (from the past 240 hour(s))  Blood culture (routine x 2)     Status: None (Preliminary result)   Collection Time: 12/29/16 11:55 PM  Result Value Ref Range Status   Specimen Description BLOOD RIGHT ANTECUBITAL  Final   Special Requests   Final    BOTTLES DRAWN AEROBIC AND ANAEROBIC Blood Culture adequate volume   Culture   Final    NO GROWTH 1 DAY Performed at [facility], [address] [place], [place] [address]    Report Status PENDING  Incomplete  Blood culture (routine x 2)  Status: None (Preliminary result)   Collection Time: 12/30/16  1:33 AM  Result Value Ref Range Status   Specimen Description BLOOD LEFT ANTECUBITAL  Final   Special Requests   Final    BOTTLES DRAWN AEROBIC AND ANAEROBIC Blood Culture adequate volume   Culture   Final    NO GROWTH 1 DAY Performed at [facility], [address] [place], [place] [address]    Report Status PENDING  Incomplete  Urine culture     Status: None   Collection Time: 12/30/16  2:15 AM  Result Value Ref Range Status   Specimen Description URINE, RANDOM  Final   Special Requests NONE  Final   Culture   Final    NO GROWTH Performed at [facility], [address], [place], [place] [address]    Report Status 12/31/2016 FINAL  Final     Labs: BNP (last 3 results) No results for input(s): BNP in the last 8760 hours. Basic Metabolic Panel:  Recent Labs Lab 12/29/16 2355 12/31/16 0431  NA 139 140  K 4.2 4.1  CL 104 108  CO2 23 26  GLUCOSE 110* 106*  BUN 17 17  CREATININE 0.98 0.99  CALCIUM 9.1 8.6*   Liver Function Tests:  Recent Labs Lab 12/29/16 2355 12/31/16 0431  AST 177* 137*  ALT 211* 162*  ALKPHOS 89 71  BILITOT 0.7 0.5  PROT 7.9 6.5  ALBUMIN 4.0 3.3*   No results for input(s): LIPASE, AMYLASE in the last 168 hours. No results for input(s): AMMONIA in the last 168 hours. CBC:  Recent Labs Lab 12/29/16 2355  12/31/16 0431  WBC 14.6* 8.5  NEUTROABS 12.4*  --   HGB 14.2 13.0  HCT 42.1 38.9*  MCV 88.6 87.8  PLT 255 270   Cardiac Enzymes: No results for input(s): CKTOTAL, CKMB, CKMBINDEX, TROPONINI in the last 168 hours. BNP: Invalid input(s): POCBNP CBG:  Recent Labs Lab 12/30/16 0824 12/31/16 0942 01/01/17 0757  GLUCAP 114* 109* 89   D-Dimer No results for input(s): DDIMER in the last 72 hours. Hgb A1c No results for input(s): HGBA1C in the last 72 hours. Lipid Profile No results for input(s): CHOL, HDL, LDLCALC, TRIG, CHOLHDL, LDLDIRECT in the last 72 hours. Thyroid function studies No results for input(s): TSH, T4TOTAL, T3FREE, THYROIDAB in the last 72 hours.  Invalid input(s): FREET3 Anemia work up No results for input(s): VITAMINB12, FOLATE, FERRITIN, TIBC, IRON, RETICCTPCT in the last 72 hours. Urinalysis    Component Value Date/Time   COLORURINE YELLOW 12/30/2016 0215   APPEARANCEUR CLEAR 12/30/2016 0215   LABSPEC 1.021 12/30/2016 0215   PHURINE 5.0 12/30/2016 0215   GLUCOSEU NEGATIVE 12/30/2016 0215   HGBUR NEGATIVE 12/30/2016 0215   BILIRUBINUR NEGATIVE 12/30/2016 0215   KETONESUR NEGATIVE 12/30/2016 0215   PROTEINUR NEGATIVE 12/30/2016 0215   UROBILINOGEN 0.2 03/26/2014 2255   NITRITE NEGATIVE 12/30/2016 0215   LEUKOCYTESUR NEGATIVE 12/30/2016 0215   Sepsis Labs Invalid input(s): PROCALCITONIN,  WBC,  LACTICIDVEN Microbiology Recent Results (from the past 240 hour(s))  Blood culture (routine x 2)     Status: None (Preliminary result)   Collection Time: 12/29/16 11:55 PM  Result Value Ref Range Status   Specimen Description BLOOD RIGHT ANTECUBITAL  Final   Special Requests   Final    BOTTLES DRAWN AEROBIC AND ANAEROBIC Blood Culture adequate volume   Culture   Final    NO GROWTH 1 DAY Performed at [facility], [address], [place], [place] [address]    Report Status PENDING  Incomplete  Blood culture (routine x 2)     Status: None  (Preliminary result)   Collection Time: 12/30/16  1:33 AM  Result Value Ref Range Status   Specimen Description BLOOD LEFT ANTECUBITAL  Final   Special Requests   Final    BOTTLES DRAWN AEROBIC AND ANAEROBIC Blood Culture adequate volume   Culture   Final    NO GROWTH 1 DAY Performed at [facility], [address], [place], [place] [address]    Report Status PENDING  Incomplete  Urine culture     Status: None   Collection Time: 12/30/16  2:15 AM  Result Value Ref Range Status   Specimen Description URINE, RANDOM  Final   Special Requests NONE  Final   Culture   Final    NO GROWTH Performed at [facility], [address] [place], [place] [address]    Report Status 12/31/2016 FINAL  Final     Time coordinating discharge: 26 minutes  SIGNED:   Maxie Barb, MD  [facility] 01/01/2017, 11:41 AM  If 7PM-7AM, please contact night-coverage [email or web address] Password [email or web address]

## 2017-01-01 NOTE — Progress Notes (Signed)
Patient is stable for discharge. Discharge instructions and medications have been reviewed with the patient and all questions answered. AVS and prescriptions given to patient.   Arta Bruce [facility] 01/01/2017

## 2017-01-01 NOTE — Progress Notes (Signed)
Patient is refusing IV to be restarted. It came out overnight and he refused the night RN to restart and refused again this morning. Also refuses Lovenox injection for VTE prophylaxis.   Arta Bruce [facility] 01/01/2017 10:08 AM

## 2017-01-04 LAB — CULTURE, BLOOD (ROUTINE X 2)
CULTURE: NO GROWTH
CULTURE: NO GROWTH
Special Requests: ADEQUATE
Special Requests: ADEQUATE

## 2017-05-07 ENCOUNTER — Emergency Department (HOSPITAL_COMMUNITY)
Admission: EM | Admit: 2017-05-07 | Discharge: 2017-05-08 | Disposition: A | Discharge status: Rehabilitation Facility | Payer: Self-pay | Attending: Emergency Medicine | Admitting: Emergency Medicine

## 2017-05-07 DIAGNOSIS — I10 Essential (primary) hypertension: Secondary | ICD-10-CM | POA: Insufficient documentation

## 2017-05-07 DIAGNOSIS — F1910 Other psychoactive substance abuse, uncomplicated: Secondary | ICD-10-CM | POA: Insufficient documentation

## 2017-05-07 DIAGNOSIS — T401X1A Poisoning by heroin, accidental (unintentional), initial encounter: Secondary | ICD-10-CM | POA: Insufficient documentation

## 2017-05-07 DIAGNOSIS — F1120 Opioid dependence, uncomplicated: Secondary | ICD-10-CM | POA: Insufficient documentation

## 2017-05-07 DIAGNOSIS — T50904A Poisoning by unspecified drugs, medicaments and biological substances, undetermined, initial encounter: Secondary | ICD-10-CM

## 2017-05-07 MED ORDER — SODIUM CHLORIDE 0.9 % IV BOLUS (SEPSIS)
1000.0000 mL | Freq: Once | INTRAVENOUS | Status: AC
  Administered 2017-05-08: 1000 mL via INTRAVENOUS

## 2017-05-07 MED ORDER — SODIUM CHLORIDE 0.9 % IV SOLN
INTRAVENOUS | Status: DC
  Administered 2017-05-08: 01:00:00 via INTRAVENOUS

## 2017-05-08 ENCOUNTER — Emergency Department (HOSPITAL_COMMUNITY): Payer: Self-pay

## 2017-05-08 ENCOUNTER — Encounter (HOSPITAL_COMMUNITY): Payer: Self-pay | Admitting: Emergency Medicine

## 2017-05-08 ENCOUNTER — Other Ambulatory Visit: Payer: Self-pay

## 2017-05-08 DIAGNOSIS — F1120 Opioid dependence, uncomplicated: Secondary | ICD-10-CM | POA: Diagnosis present

## 2017-05-08 DIAGNOSIS — T401X1A Poisoning by heroin, accidental (unintentional), initial encounter: Secondary | ICD-10-CM | POA: Diagnosis present

## 2017-05-08 LAB — COMPREHENSIVE METABOLIC PANEL
ALBUMIN: 4.4 g/dL (ref 3.5–5.0)
ALT: 202 U/L — ABNORMAL HIGH (ref 17–63)
ANION GAP: 16 — AB (ref 5–15)
AST: 92 U/L — ABNORMAL HIGH (ref 15–41)
Alkaline Phosphatase: 62 U/L (ref 38–126)
BILIRUBIN TOTAL: 2 mg/dL — AB (ref 0.3–1.2)
BUN: 17 mg/dL (ref 6–20)
CALCIUM: 8.9 mg/dL (ref 8.9–10.3)
CO2: 21 mmol/L — AB (ref 22–32)
Chloride: 102 mmol/L (ref 101–111)
Creatinine, Ser: 1.1 mg/dL (ref 0.61–1.24)
GFR calc Af Amer: >60 mL/min (ref >60)
GFR calc non Af Amer: >60 mL/min (ref >60)
GLUCOSE: 87 mg/dL (ref 65–99)
POTASSIUM: 3.7 mmol/L (ref 3.5–5.1)
SODIUM: 139 mmol/L (ref 135–145)
TOTAL PROTEIN: 7.7 g/dL (ref 6.5–8.1)

## 2017-05-08 LAB — SALICYLATE LEVEL: Salicylate Lvl: <7 mg/dL (ref 2.8–30.0)

## 2017-05-08 LAB — CBC WITH DIFFERENTIAL/PLATELET
Basophils Absolute: 0 10*3/uL (ref 0.0–0.1)
Basophils Relative: 0 %
EOS ABS: 0 10*3/uL (ref 0.0–0.7)
Eosinophils Relative: 0 %
HEMATOCRIT: 36.7 % — AB (ref 39.0–52.0)
HEMOGLOBIN: 12.9 g/dL — AB (ref 13.0–17.0)
LYMPHS ABS: 2.7 10*3/uL (ref 0.7–4.0)
LYMPHS PCT: 20 %
MCH: 29.7 pg (ref 26.0–34.0)
MCHC: 35.1 g/dL (ref 30.0–36.0)
MCV: 84.4 fL (ref 78.0–100.0)
MONOS PCT: 15 %
Monocytes Absolute: 2.1 10*3/uL — ABNORMAL HIGH (ref 0.1–1.0)
NEUTROS PCT: 65 %
Neutro Abs: 8.8 10*3/uL — ABNORMAL HIGH (ref 1.7–7.7)
Platelets: 246 10*3/uL (ref 150–400)
RBC: 4.35 MIL/uL (ref 4.22–5.81)
RDW: 13.3 % (ref 11.5–15.5)
WBC: 13.7 10*3/uL — AB (ref 4.0–10.5)

## 2017-05-08 LAB — ETHANOL

## 2017-05-08 LAB — RAPID URINE DRUG SCREEN, HOSP PERFORMED
AMPHETAMINES: POSITIVE — AB
Barbiturates: NOT DETECTED
Benzodiazepines: NOT DETECTED
Cocaine: NOT DETECTED
Opiates: POSITIVE — AB
Tetrahydrocannabinol: NOT DETECTED

## 2017-05-08 LAB — CBG MONITORING, ED: GLUCOSE-CAPILLARY: 79 mg/dL (ref 65–99)

## 2017-05-08 LAB — ACETAMINOPHEN LEVEL

## 2017-05-08 MED ORDER — NALOXONE HCL 2 MG/2ML IJ SOSY
1.0000 mg | PREFILLED_SYRINGE | Freq: Once | INTRAMUSCULAR | Status: AC
  Administered 2017-05-08: 1 mg via INTRAVENOUS
  Filled 2017-05-08: qty 2

## 2017-05-08 MED ORDER — ALUM & MAG HYDROXIDE-SIMETH 200-200-20 MG/5ML PO SUSP: 30.0000 mL | Freq: Four times a day (QID) | ORAL | Status: DC | PRN

## 2017-05-08 MED ORDER — NICOTINE 21 MG/24HR TD PT24: 21.0000 mg | MEDICATED_PATCH | Freq: Every day | TRANSDERMAL | Status: DC

## 2017-05-08 MED ORDER — ONDANSETRON HCL 4 MG PO TABS: 4.0000 mg | ORAL_TABLET | Freq: Three times a day (TID) | ORAL | Status: DC | PRN

## 2017-05-08 NOTE — BH Assessment (Signed)
[facility] Assessment Progress Note  Per Nira ConnJason Berry, NP pt is recommended for continued monitoring for safety and stabilization and reassessment by psych in the AM. EDP Palumbo, April, MD aware and in agreement with disposition. Pt's nurse Roseanna RainbowIlene, RN aware of recommendation.   Princess BruinsAquicha Shalom Ware, MSW, LCSW Therapeutic Triage Specialist  [phone]

## 2017-05-08 NOTE — BH Assessment (Addendum)
Assessment Note  Grant Tapia is an 32 y.o. male who presents to the ED voluntarily due to an unintentional OD of IV heroin. Pt is visibly trembling during the assessment, constantly fidgeting in the bed. Pt is difficult to comprehend at times as his speech is slurred and sometimes inaudible. Pt did report to TTS that he is not suicidal and he states the OD was not a suicide attempt. Pt denies HI and denies AVH. Pt states he is unable to return to his living situation because he was living with his girlfriend and she put him out. TTS spoke with EDP Dr. Nicanor Alcon, MD who states she spoke with his girlfriend and he reportedly told her that he was "done with everything and wanted to end it all." Upon review of the chart, pt has been seen in the ED multiple times of polysubstance abuse. Pt was last seen in September 2018 and per chart, pt declined substance abuse treatment at that time. It is unknown to this Clinical research associate if the pt has ever received SA treatment as his hx is limited due to his current mental state.   Per Nira Conn, NP pt is recommended for continued monitoring for safety and stabilization and reassessment by psych in the AM. EDP Palumbo, April, MD aware and in agreement with disposition. Pt's nurse Roseanna Rainbow, RN aware of recommendation.   Diagnosis: Opioid Use Disorder, severe  Past Medical History:  Past Medical History:  Diagnosis Date  . Heroin abuse (HCC)   . Hypertension   . IV drug user   . Overdose    Past Surgical History:  Procedure Laterality Date  . I&D EXTREMITY Left 11/19/2016   Procedure: INCISION AND DRAINAGE LEFT ELBOW  ABSCESS;  Surgeon: Myrene Galas, MD;  Location: [facility];  Service: Orthopedics;  Laterality: Left;    Family History: History reviewed. No pertinent family history.  Social History:  reports that  has never smoked. he has never used smokeless tobacco. He reports that he drinks alcohol. He reports that he uses drugs. Drug: IV.  Additional Social  History:  Alcohol / Drug Use Pain Medications: See MAR Prescriptions: See MAR Over the Counter: See MAR History of alcohol / drug use?: Yes Longest period of sobriety (when/how long): none reported Substance #1 Name of Substance 1: Heroin 1 - Age of First Use: unknown 1 - Amount (size/oz): varies 1 - Frequency: daily 1 - Duration: ongoing 1 - Last Use / Amount: 05/07/17  CIWA: CIWA-Ar BP: 110/64 Pulse Rate: (!) 106 COWS:    Allergies: No Known Allergies  Home Medications:  (Not in a hospital admission)  OB/GYN Status:  No LMP for male patient.  General Assessment Data Location of Assessment: [facility] TTS Assessment: In system Is this a Tele or Face-to-Face Assessment?: Face-to-Face Is this an Initial Assessment or a Re-assessment for this encounter?: Initial Assessment Marital status: Single Is patient pregnant?: No Pregnancy Status: No Living Arrangements: Spouse/significant other Can pt return to current living arrangement?: No(pt states his girlfriend put him out of the home ) Admission Status: Voluntary Is patient capable of signing voluntary admission?: Yes Referral Source: Self/Family/Friend Insurance type: none     Crisis Care Plan Living Arrangements: Spouse/significant other Name of Psychiatrist: UTA due to AMS Name of Therapist: UTA due to AMS  Education Status Is patient currently in school?: No Highest grade of school patient has completed: UTA due to AMS  Risk to self with the past 6 months Suicidal Ideation: No(pt denies) Has patient  been a risk to self within the past 6 months prior to admission? : Yes Suicidal Intent: No Has patient had any suicidal intent within the past 6 months prior to admission? : No Is patient at risk for suicide?: No Suicidal Plan?: No Has patient had any suicidal plan within the past 6 months prior to admission? : No Access to Means: No What has been your use of drugs/alcohol within the last 12 months?: reports to  daily heroin use  Previous Attempts/Gestures: No Triggers for Past Attempts: None known Intentional Self Injurious Behavior: None Family Suicide History: No Recent stressful life event(s): Other (Comment)(daily IV heroin use ) Persecutory voices/beliefs?: No Depression: No Substance abuse history and/or treatment for substance abuse?: Yes Suicide prevention information given to non-admitted patients: Not applicable  Risk to Others within the past 6 months Homicidal Ideation: No Does patient have any lifetime risk of violence toward others beyond the six months prior to admission? : No Thoughts of Harm to Others: No Current Homicidal Intent: No Current Homicidal Plan: No Access to Homicidal Means: No History of harm to others?: No Assessment of Violence: None Noted Does patient have access to weapons?: (UTA due to AMS) Criminal Charges Pending?: (UTA due to AMS) Is patient on probation?: Unknown  Psychosis Hallucinations: None noted(pt denies) Delusions: None noted  Mental Status Report Appearance/Hygiene: Disheveled, Revealing clothes/seductive clothing Eye Contact: Poor Motor Activity: Restlessness, Shuffling, Tremors, Unsteady Speech: Slurred, Incoherent Level of Consciousness: Drowsy Mood: Anxious Affect: Anxious Anxiety Level: Severe Thought Processes: Coherent, Relevant Judgement: Impaired Orientation: Person, Place Obsessive Compulsive Thoughts/Behaviors: Minimal  Cognitive Functioning Concentration: Poor Memory: Remote Intact, Recent Intact IQ: Average Insight: Poor Impulse Control: Poor Appetite: Good Sleep: Unable to Assess Total Hours of Sleep: (UTA due to AMS) Vegetative Symptoms: Unable to Assess  ADLScreening [facility]) Patient's cognitive ability adequate to safely complete daily activities?: Yes Patient able to express need for assistance with ADLs?: Yes Independently performs ADLs?: Yes (appropriate for developmental age)  Prior  Inpatient Therapy Prior Inpatient Therapy: (UTA due to AMS)  Prior Outpatient Therapy Prior Outpatient Therapy: (UTA due to AMS) Does patient have an ACCT team?: Unknown Does patient have Intensive In-House Services?  : Unknown Does patient have [facility] services? : Unknown Does patient have P4CC services?: Unknown  ADL Screening (condition at time of admission) Patient's cognitive ability adequate to safely complete daily activities?: Yes Is the patient deaf or have difficulty hearing?: No Does the patient have difficulty seeing, even when wearing glasses/contacts?: No Does the patient have difficulty concentrating, remembering, or making decisions?: No Patient able to express need for assistance with ADLs?: Yes Does the patient have difficulty dressing or bathing?: No Independently performs ADLs?: Yes (appropriate for developmental age) Does the patient have difficulty walking or climbing stairs?: No Weakness of Legs: None Weakness of Arms/Hands: None  Home Assistive Devices/Equipment Home Assistive Devices/Equipment: None    Abuse/Neglect Assessment (Assessment to be complete while patient is alone) Abuse/Neglect Assessment Can Be Completed: Unable to assess, patient is non-responsive or altered mental status     Advance Directives (For Healthcare) Does Patient Have a Medical Advance Directive?: No Would patient like information on creating a medical advance directive?: No - Patient declined    Additional Information 1:1 In Past 12 Months?: No CIRT Risk: No Elopement Risk: No Does patient have medical clearance?: (pending)     Disposition: Per Nira ConnJason Berry, NP pt is recommended for continued monitoring for safety and stabilization and reassessment by psych in the  AM. EDP Palumbo, April, MD aware and in agreement with disposition. Pt's nurse Roseanna Rainbow, RN aware of recommendation.    Disposition Initial Assessment Completed for this Encounter: Yes Disposition of Patient:  Re-evaluation by Psychiatry recommended(per Nira Conn, NP)  On Site Evaluation by:   Reviewed with Physician:    Karolee Ohs 05/08/2017 6:16 AM

## 2017-05-08 NOTE — ED Triage Notes (Signed)
Pt presents by family for possible overdose. Pt agitated and uncertain of what was taken tonight.

## 2017-05-08 NOTE — Patient Outreach (Signed)
ED Peer Support Specialist Patient Intake (Complete at intake & 30-60 Day Follow-up)  Name: Grant Tapia  MRN: [id number]  Age: 32 y.o.   Date of Admission: 05/08/2017  Intake: Initial Comments:      Primary Reason Admitted:Pt is visibly trembling during the assessment, constantly fidgeting in the bed. Pt is difficult to comprehend at times as his speech is slurred and sometimes inaudible. Pt did report to TTS that he is not suicidal and he states the OD was not a suicide attempt. Pt denies HI and denies AVH. Pt states he is unable to return to his living situation because he was living with his girlfriend and she put him out. TTS spoke with EDP Dr. Nicanor Alcon, MD who states she spoke with his girlfriend and he reportedly told her that he was "done with everything and wanted to end it all." Upon review of the chart, pt has been seen in the ED multiple times of polysubstance abuse. Pt was last seen in September 2018 and per chart, pt declined substance abuse treatment at that time. It is unknown to this Clinical research associate if the pt has ever received SA treatment as his hx is limited due to his current mental state.       Lab values: Alcohol/ETOH: Positive Positive UDS?   Amphetamines: Yes Barbiturates: No Benzodiazepines: No Cocaine: No Opiates: Yes Cannabinoids: No  Demographic information: Gender: Male Ethnicity: African American Marital Status: Single Insurance Status: Uninsured/Self-pay Control and instrumentation engineer (Work Engineer, agricultural, Sales executive, etc.: No Lives with: Partner/Spouse Living situation: House/Apartment  Reported Patient History: Patient reported health conditions: Depression, Anxiety disorders Patient aware of HIV and hepatitis status: No  In past year, has patient visited ED for any reason? No  Number of ED visits:    Reason(s) for visit:    In past year, has patient been hospitalized for any reason? No  Number of hospitalizations:    Reason(s)  for hospitalization:    In past year, has patient been arrested? Yes  Number of arrests:    Reason(s) for arrest: Probation Violations  In past year, has patient been incarcerated? Yes  Number of incarcerations:    Reason(s) for incarceration: Just got out 4 days ago  In past year, has patient received medication-assisted treatment? Yes, [facility]  In past year, patient received the following treatments:    In past year, has patient received any harm reduction services? No  Did this include any of the following?    In past year, has patient received care from a mental health provider for diagnosis other than SUD? No  In past year, is this first time patient has overdosed? No  Number of past overdoses:    In past year, is this first time patient has been hospitalized for an overdose? No  Number of hospitalizations for overdose(s):    Is patient currently receiving treatment for a mental health diagnosis? No  Patient reports experiencing difficulty participating in SUD treatment: No    Most important reason(s) for this difficulty?    Has patient received prior services for treatment? No  In past, patient has received services from following agencies:    Plan of Care:  Suggested follow up at these agencies/treatment centers: [facility]), [facility])  Other information: CPSS processed with Pt for motivational interviewing CPSS was able to ask pt if he wanted treatment and is he willing to go today if he gets accepted. [facility] CPSS talked with Pt to understand what plan of care Pt wanted. [facility]PSS made Pt aware that [facility]    Arlys JohnBrian Zea Kostka, CPSS  05/08/2017 10:42 AM

## 2017-05-08 NOTE — Progress Notes (Signed)
32 yo male who presented to the ED after an accidental overdose on heroin.  He denies suicidal/homicidal ideations, hallucinations, or withdrawal symptoms.  Agreeable to meet with Peer Support who got him into [facility] and he agrees to go.  No prior inpatient rehab.  [facility]  Nanine MeansJamison Lord, PMHNP

## 2017-05-08 NOTE — ED Notes (Signed)
SBAR Report received from previous nurse. Pt received calm and visible on unit. Pt denies current SI/ HI, A/V H, depression, anxiety, or pain at this time, and appears otherwise stable and free of distress. Pt reminded of camera surveillance, q 15 min rounds, and rules of the milieu. Will continue to assess. 
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
## 2017-05-08 NOTE — ED Notes (Signed)
Pt having periods of apnea with SPO2 saturation in the 70% range. Dr.Palumbo aware. Pt aroused and SPO2 improved to 100%

## 2017-05-08 NOTE — [facility] Suicide Risk Assessment (Signed)
Suicide Risk Assessment  Discharge Assessment   [facility]   Principal Problem: Poisoning by heroin, accidental (unintentional), initial encounter [facility](HCC) Discharge Diagnoses:  Patient Active Problem List   Diagnosis Date Noted  . Poisoning by heroin, accidental (unintentional), initial encounter (HCC) [T40.1X1A] 05/08/2017    Priority: High  . Heroin dependence (HCC) [F11.20] 05/08/2017    Priority: High  . Back pain [M54.9] 12/30/2016  . Abnormal LFTs [R94.5] 12/30/2016  . Polysubstance abuse (HCC) [F19.10]   . Cellulitis [L03.90] 11/19/2016  . IV drug user [F19.90] 11/19/2016  . Abscess [L02.91] 11/19/2016  . Delirium [R41.0]   . Tachycardia [R00.0]   . Overdose [T50.901A] 07/18/2016  . Drug withdrawal (HCC) [F19.939] 07/16/2016  . AKI (acute kidney injury) (HCC) [N17.9] 07/15/2016  . Alcohol abuse [F10.10] 07/15/2016  . Heroin abuse (HCC) [F11.10] 07/15/2016  . 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 Cocaine abuse (HCC) [F14.10] 07/15/2016  . Acute metabolic encephalopathy [G93.41] 07/15/2016  . Lactic acidosis [E87.2] 03/29/2014  . Sepsis (HCC) [A41.9] 03/27/2014  . Chest pain [R07.9]   . Headache [R51]   . Neck pain [M54.2]     Total Time spent with patient: 45 minutes  Musculoskeletal: Strength & Muscle Tone: within normal limits Gait & Station: normal Patient leans: N/A  Psychiatric Specialty Exam:   Blood pressure 110/64, pulse (!) 106, temperature 99.5 F (37.5 C), temperature source Oral, resp. rate 16, height 5\' 11"  (1.803 m), weight 86.2 kg (190 lb), SpO2 96 %.Body mass index is 26.5 kg/m.  General Appearance: Casual  Eye Contact::  Good  Speech:  Normal Rate409  Volume:  Normal  Mood:  Euthymic  Affect:  Congruent  Thought Process:  Coherent and Descriptions of Associations: Intact  Orientation:  Full (Time, Place, and Person)  Thought Content:  WDL and Logical  Suicidal Thoughts:  No  Homicidal Thoughts:  No   Memory:  Immediate;   Good Recent;   Good Remote;   Good  Judgement:  Fair  Insight:  Good  Psychomotor Activity:  Normal  Concentration:  Good  Recall:  Good  Fund of Knowledge:Good  Language: Good  Akathisia:  No  Handed:  Right  AIMS (if indicated):     Assets:  Leisure Time Physical Health Resilience Social Support  Sleep:     Cognition: WNL  ADL's:  Intact   Mental Status Per Nursing Assessment::   On Admission:   32 yo male who presented to the ED after an accidental overdose on heroin.  He denies suicidal/homicidal ideations, hallucinations, or withdrawal symptoms.  Agreeable to meet with Peer Support who got him into [facility] and he agrees to go.  No prior inpatient rehab.  [facility]  Demographic Factors:  Male and Adolescent or young adult  Loss Factors: Legal issues  Historical Factors: NA  Risk Reduction Factors:   Sense of responsibility to family and Positive social support  Continued Clinical Symptoms:  None   Cognitive Features That Contribute To Risk:  None    Suicide Risk:  Minimal: No identifiable suicidal ideation.  Patients presenting with no risk factors but with morbid ruminations; may be classified as minimal risk based on the severity of the depressive symptoms    Plan Of Care/Follow-up recommendations:  Activity:  as tolerated Diet:  heart healthy diet  Blakelee Allington, NP 05/08/2017, 2:04 PM

## 2017-05-08 NOTE — ED Notes (Signed)
[facility] Expected date:  Expected time:  Means of arrival:  Comments: Colon Brancharson

## 2017-09-09 ENCOUNTER — Emergency Department (HOSPITAL_COMMUNITY)
Admission: EM | Admit: 2017-09-09 | Discharge: 2017-09-09 | Disposition: A | Discharge status: Home / Self Care | Payer: Self-pay | Attending: Emergency Medicine | Admitting: Emergency Medicine

## 2017-09-09 ENCOUNTER — Encounter (HOSPITAL_COMMUNITY): Payer: Self-pay

## 2017-09-09 DIAGNOSIS — R252 Cramp and spasm: Secondary | ICD-10-CM | POA: Insufficient documentation

## 2017-09-09 DIAGNOSIS — F1920 Other psychoactive substance dependence, uncomplicated: Secondary | ICD-10-CM | POA: Insufficient documentation

## 2017-09-09 DIAGNOSIS — R110 Nausea: Secondary | ICD-10-CM | POA: Insufficient documentation

## 2017-09-09 DIAGNOSIS — F1190 Opioid use, unspecified, uncomplicated: Secondary | ICD-10-CM | POA: Insufficient documentation

## 2017-09-09 DIAGNOSIS — I10 Essential (primary) hypertension: Secondary | ICD-10-CM | POA: Insufficient documentation

## 2017-09-09 MED ORDER — LACTATED RINGERS IV BOLUS
1000.0000 mL | Freq: Once | INTRAVENOUS | Status: AC
  Administered 2017-09-09: 1000 mL via INTRAVENOUS

## 2017-09-09 MED ORDER — LORAZEPAM 2 MG/ML IJ SOLN
1.0000 mg | Freq: Once | INTRAMUSCULAR | Status: AC
  Administered 2017-09-09: 1 mg via INTRAVENOUS
  Filled 2017-09-09: qty 1

## 2017-09-09 NOTE — ED Triage Notes (Signed)
Pt presents with onset of cramping, palpitations, shortness of breath after injecting heroine at 1545 today.  Pt reports h/o same, used same Consulting civil engineer.

## 2017-09-09 NOTE — ED Notes (Signed)
Pt verbalized understanding of discharge paperwork and understanding return precautions. IV access removed

## 2017-09-09 NOTE — ED Provider Notes (Signed)
[facility] EMERGENCY DEPARTMENT Provider Note   CSN: [id number] Arrival date & time: 09/09/17  1714     History   Chief Complaint No chief complaint on file.   HPI Grant Tapia is a 32 y.o. male.  HPI   32 year old male with leg cramping shortly after intravenous drug use.  Patient injected what he thought was heroin.  He states that within a few minutes he began having severe progressive cramps in his lower extremities.  He states pain from "my butt crack down to my toes."  Some abdominal cramping.  He denies chest pain or shortness of breath to me.  Mild nausea.  Past Medical History:  Diagnosis Date  . Heroin abuse (HCC)   . Hypertension   . IV drug user   . Overdose     Patient Active Problem List   Diagnosis Date Noted  . Poisoning by heroin, accidental (unintentional), initial encounter (HCC) 05/08/2017  . Heroin dependence (HCC) 05/08/2017  . Back pain 12/30/2016  . Abnormal LFTs 12/30/2016  . Polysubstance abuse (HCC)   . Cellulitis 11/19/2016  . IV drug user 11/19/2016  . Abscess 11/19/2016  . Delirium   . Tachycardia   . Overdose 07/18/2016  . Drug withdrawal (HCC) 07/16/2016  . AKI (acute kidney injury) (HCC) 07/15/2016  . Alcohol abuse 07/15/2016  . Heroin abuse (HCC) 07/15/2016  . 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 Cocaine abuse (HCC) 07/15/2016  . Acute metabolic encephalopathy 07/15/2016  . Lactic acidosis 03/29/2014  . Sepsis (HCC) 03/27/2014  . Chest pain   . Headache   . Neck pain     Past Surgical History:  Procedure Laterality Date  . I&D EXTREMITY Left 11/19/2016   Procedure: INCISION AND DRAINAGE LEFT ELBOW  ABSCESS;  Surgeon: Myrene Galas, MD;  Location: [facility] OR;  Service: Orthopedics;  Laterality: Left;        Home Medications    Prior to Admission medications   Not on File    Family History History reviewed. No pertinent family history.  Social History Social History   Tobacco Use    . Smoking status: Never Smoker  . Smokeless tobacco: Never Used  Substance Use Topics  . Alcohol use: Yes  . Drug use: Yes    Types: IV    Comment: heroin     Allergies   Patient has no known allergies.   Review of Systems Review of Systems All systems reviewed and negative, other than as noted in HPI.   Physical Exam Updated Vital Signs BP (!) 138/96   Pulse (!) 115   Temp 99.8 F (37.7 C) (Oral)   Resp 18   Ht  (1.753 m)   Wt 74.8 kg (165 lb)   SpO2 100%   BMI 24.37 kg/m   Physical Exam  Constitutional: He appears well-developed and well-nourished. No distress.  HENT:  Head: Normocephalic and atraumatic.  Eyes: Pupils are equal, round, and reactive to light. Conjunctivae and EOM are normal. Right eye exhibits no discharge. Left eye exhibits no discharge.  Neck: Neck supple.  Cardiovascular: Regular rhythm and normal heart sounds. Exam reveals no gallop and no friction rub.  No murmur heard. Tachycardic  Pulmonary/Chest: Effort normal and breath sounds normal. No respiratory distress.  Abdominal: Soft. He exhibits no distension. There is no tenderness.  Musculoskeletal: He exhibits no edema or tenderness.  Neurological: He is alert.  Skin: Skin is warm and dry.  Psychiatric:  Extremely anxious.  Answers questions  appropriately.  Following commands.  No focal motor deficits.  Nursing note and vitals reviewed.    ED Treatments / Results  Labs (all labs ordered are listed, but only abnormal results are displayed) Labs Reviewed - No data to display  EKG EKG Interpretation  Date/Time:  Thursday Sep 09 2017 17:28:04 EDT Ventricular Rate:  116 PR Interval:    QRS Duration: 103 QT Interval:  344 QTC Calculation: 478 R Axis:   18 Text Interpretation:  Sinus tachycardia Abnormal inferior Q waves ST elev, probable normal early repol pattern Borderline prolonged QT interval no significant change since Jan 2019 Confirmed by Pricilla Loveless [phone]) on  09/10/2017 4:16:38 PM   Radiology No results found.  Procedures Procedures (including critical care time)  Medications Ordered in ED Medications  LORazepam (ATIVAN) injection 1 mg (1 mg Intravenous Given 09/09/17 1740)  lactated ringers bolus 1,000 mL (0 mLs Intravenous Stopped 09/09/17 1858)     Initial Impression / Assessment and Plan / ED Course  I have reviewed the triage vital signs and the nursing notes.  Pertinent labs & imaging results that were available during my care of the patient were reviewed by me and considered in my medical decision making (see chart for details).     32 year old male with severe leg cramping after intravenous drug usage.  Likely not purely heroin given clinical picture.  Heart rate decreasing to.  Of observation.  Cramps much improved after dose of Ativan.  I doubt emergent process.  Patient requesting detox.  He is not suicidal or homicidal.  Outpatient resources were provided.  Final Clinical Impressions(s) / ED Diagnoses   Final diagnoses:  Leg cramps  Heroin use    ED Discharge Orders    None       Raeford Razor, MD 09/11/17 [phone]

## 2017-09-10 ENCOUNTER — Other Ambulatory Visit: Payer: Self-pay

## 2017-09-10 ENCOUNTER — Encounter (HOSPITAL_BASED_OUTPATIENT_CLINIC_OR_DEPARTMENT_OTHER): Payer: Self-pay

## 2017-09-10 ENCOUNTER — Emergency Department (HOSPITAL_BASED_OUTPATIENT_CLINIC_OR_DEPARTMENT_OTHER)
Admission: EM | Admit: 2017-09-10 | Discharge: 2017-09-10 | Disposition: A | Discharge status: Home / Self Care | Payer: Self-pay | Attending: Emergency Medicine | Admitting: Emergency Medicine

## 2017-09-10 DIAGNOSIS — F1123 Opioid dependence with withdrawal: Secondary | ICD-10-CM | POA: Insufficient documentation

## 2017-09-10 DIAGNOSIS — I10 Essential (primary) hypertension: Secondary | ICD-10-CM | POA: Insufficient documentation

## 2017-09-10 LAB — COMPREHENSIVE METABOLIC PANEL
ALT: 51 U/L (ref 17–63)
ANION GAP: 6 (ref 5–15)
AST: 63 U/L — ABNORMAL HIGH (ref 15–41)
Albumin: 3.9 g/dL (ref 3.5–5.0)
Alkaline Phosphatase: 47 U/L (ref 38–126)
BILIRUBIN TOTAL: 0.8 mg/dL (ref 0.3–1.2)
BUN: 14 mg/dL (ref 6–20)
CALCIUM: 8.4 mg/dL — AB (ref 8.9–10.3)
CO2: 24 mmol/L (ref 22–32)
CREATININE: 0.92 mg/dL (ref 0.61–1.24)
Chloride: 108 mmol/L (ref 101–111)
GFR calc non Af Amer: >60 mL/min (ref >60)
Glucose, Bld: 81 mg/dL (ref 65–99)
Potassium: 4.8 mmol/L (ref 3.5–5.1)
Sodium: 138 mmol/L (ref 135–145)
TOTAL PROTEIN: 7.6 g/dL (ref 6.5–8.1)

## 2017-09-10 LAB — CBC
HCT: 43.1 % (ref 39.0–52.0)
Hemoglobin: 14.7 g/dL (ref 13.0–17.0)
MCH: 29.5 pg (ref 26.0–34.0)
MCHC: 34.1 g/dL (ref 30.0–36.0)
MCV: 86.5 fL (ref 78.0–100.0)
PLATELETS: 294 10*3/uL (ref 150–400)
RBC: 4.98 MIL/uL (ref 4.22–5.81)
RDW: 13.4 % (ref 11.5–15.5)
WBC: 4.9 10*3/uL (ref 4.0–10.5)

## 2017-09-10 LAB — ETHANOL: Alcohol, Ethyl (B): <10 mg/dL (ref <10)

## 2017-09-10 LAB — RAPID URINE DRUG SCREEN, HOSP PERFORMED
AMPHETAMINES: POSITIVE — AB
BENZODIAZEPINES: NOT DETECTED
Barbiturates: NOT DETECTED
Cocaine: NOT DETECTED
OPIATES: POSITIVE — AB
Tetrahydrocannabinol: NOT DETECTED

## 2017-09-10 MED ORDER — CLONIDINE HCL 0.2 MG PO TABS: 0.2000 mg | ORAL_TABLET | Freq: Two times a day (BID) | ORAL | 0 refills | Status: DC

## 2017-09-10 MED ORDER — DICYCLOMINE HCL 20 MG PO TABS: 20.0000 mg | ORAL_TABLET | Freq: Two times a day (BID) | ORAL | 0 refills | Status: DC

## 2017-09-10 MED ORDER — CYCLOBENZAPRINE HCL 10 MG PO TABS
10.0000 mg | ORAL_TABLET | Freq: Once | ORAL | Status: AC
  Administered 2017-09-10: 10 mg via ORAL
  Filled 2017-09-10: qty 1

## 2017-09-10 MED ORDER — CYCLOBENZAPRINE HCL 10 MG PO TABS: 10.0000 mg | ORAL_TABLET | Freq: Three times a day (TID) | ORAL | 0 refills | Status: DC | PRN

## 2017-09-10 MED ORDER — ONDANSETRON 4 MG PO TBDP
4.0000 mg | ORAL_TABLET | Freq: Once | ORAL | Status: AC
  Administered 2017-09-10: 4 mg via ORAL
  Filled 2017-09-10: qty 1

## 2017-09-10 MED ORDER — ONDANSETRON 4 MG PO TBDP: 4.0000 mg | ORAL_TABLET | Freq: Three times a day (TID) | ORAL | 0 refills | Status: DC | PRN

## 2017-09-10 MED ORDER — DICYCLOMINE HCL 10 MG PO CAPS
10.0000 mg | ORAL_CAPSULE | Freq: Once | ORAL | Status: AC
  Administered 2017-09-10: 10 mg via ORAL
  Filled 2017-09-10: qty 1
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
## 2017-09-10 NOTE — Discharge Instructions (Signed)
Substance Abuse Treatment Programs ° °Intensive Outpatient Programs °[facility]     °[address]      °[place], [place]                   °[phone]      ° °[facility] °[address] °[place], [place] °[phone] ° °[facility]     °(Inpatient and outpatient)     °[address]           °[phone]   ° °[facility] °[phone] (Suboxone and Methadone) ° °[address]      °[place], [place] [address]      °[phone]      ° °[address] °[place], [place] °[phone] ° °[facility] (Outpatient/Inpatient, Chemical)    °(insurance only) [phone]      °       °[facility] (Groups & Residential) °[place], [place] °[phone] ° °   °[facility]     °[address]     °[place], [place]      °[phone]      ° °[facility] (for caregivers and family) °[address] °[place], [place] °336-299-4655 ° ° ° ° ° °Residential Treatment Programs °[facility]      °[address], [place], [place] [address]  °[phone]      ° °[facility] °[address] [place], [place] [address] °[phone] ° °[facility]        °[phone]      ° °[facility] °[phone] ° °[facility]             °[address]                                         °[place], [place]                                                °877-615-2722 or 336-784-9470                              ° °[facility] [place] °[address] °[place], [address] °[phone] ° °[facility]    °[address]      °[place], [place]     °[phone]      ° °[facility] [place] [facility] °[address] °[place], [place] °[phone] ° °[facility]   °[address]     °[place], [place] [address]     °[phone]      °Admissions: 8am-3pm M-F ° °[facility] °[address] °[place],  [place] °[phone] ° °[facility]: Residential Program (90 Days)   °[place], [place]      °336-725-8389 or 800-758-6077    ° °[facility] °[place], [place] °(Walk in Hours over the weekend or by referral) ° °[facility] °[address], [place], [place] [address] °([phone] ° °Crisis Mobile: [facility]:  1-877-626-1772 (for crisis response 24 hours a day) °[facility] Hotline:      1-800-256-2452 °Outpatient Psychiatry and Counseling ° °[facility]: Mobile Crisis   Management 24 hours:  1-877-626-1772 ° °[facility] sliding scale fee and walk in schedule: M-F 8am-12pm/1pm-3pm °[address]  °[place], [place] [address] °[phone] ° °[facility] °[address] °[place], [place] [address] °336-703-9650 ° °[facility] ([facility])- new patient walk-in appointments available Monday - Friday 8am -3pm.          °[address] °[place], [place] [address] °336-676-6840 or crisis line- 336-676-6905 ° °[facility] Intensive Outpatient Therapy Program °[address] °[place], [place] [address] °336-832-9804 ° °[facility]                  °Crisis Services      °[phone]      °[address]     °[place], [place] [address]                ° °[facility]   °[facility] °[phone] °[address] °[place], [place] [address] ° ° °[facility]          °[address]  °[place], [place] [address]       °[phone] ° °[facility] °[address] °[place], [place] [address] °336-292-1510 ° °[facility]    °[address]    °[place], [place] [address]     °[phone]      ° °Parish McKinney, MD     °[address]     °[place] [address]     °336-282-1251     °  °[facility] °[address] °[place] [address] ° °[facility]     °[address]     °[place], [place]      °336-542-2076      ° °[facility] °Shamsher Ahluwalia, MD °[address] °[place], [place] [address] °[phone] ° °[facility]     °[address]     °[place], [place] [address]     °336-274-1237      ° °[facility] °[address] °[place], [place] [address] °336-854-4450 °Resources for Temporary Residential Assistance/Crisis Centers ° °DAY CENTERS °[facility] °M-F 8am-3pm   °[address] [place], [place] [address]   336-332-0824 °Services include: laundry, barbering, support groups, case management, phone  & computer access, showers, AA/NA mtgs, mental health/substance abuse nurse, job skills class, disability information, VA assistance, spiritual classes, etc.  ° °HOMELESS SHELTERS ° °[facility]     °[facility]   °[address], [place]     °[phone]       °       °[facility] (women and children)       °[address] °[place], [place] [address] °336-275-0820 °[email or web address] for application and process °Application Required ° °[facility]   °[address]    °[place] [address]     °[phone]       °             °[facility] °[address] °[place],  [address] °336.273.5572 °336-235-0363(schedule application appt.) °Application Required ° °[facility] (women only)    °[address]     °[place],  [address]     °336-884-1039      °  Intake starts 6pm daily °Need valid ID, SSC, & Police report °[facility] [place] °[address] °[place], [place] °336-881-5420 °Application Required ° °[facility] (men only)     °[address]      °[place], [place]     °[phone]      ° °[facility] °(Pregnant women only) °[address] °[place], [place] °336-275-0206 ° °[facility]      °[address]      °[place], [place] [address]     °[phone]      °       °[facility] °[address] °[place], [place] °336-723-1848 °90 day commitment/SA/Application process ° °[facility](men only)     °[address]     °[place], [place]     °[phone]       °Check-in at 7pm     °       °[facility] °[address] °[place], [place] [address] °336-248-6684 °Men/Women/Women and Children must be there by 7 pm ° °[facility] °[place], [place] °336-722-8721                ° °

## 2017-09-10 NOTE — ED Provider Notes (Signed)
Emergency Department Provider Note   I have reviewed the triage vital signs and the nursing notes.   HISTORY  Chief Complaint Medical Clearance   HPI Grant Tapia is a 32 y.o. male with PMH of IVDA and heroine abuse to the emergency department seeking detox.  The patient states he went to [facility] yesterday with same complaint and was discharged home with outpatient resources.  He continued to use heroin last night and this morning but returns today seeking detox.  He is reporting some cramping abdominal pain with restless leg symptoms and nausea.  He has had some associated diarrhea.  He states in the past he saw a International aid/development worker and was set up with [facility].  Patient denies any alcohol or benzodiazepine abuse.  He states that he does have Narcan at home.  Denies any fevers or chills.  No areas of redness or concern for abscess.   Past Medical History:  Diagnosis Date  . Heroin abuse (HCC)   . Hypertension   . IV drug user   . Overdose     Patient Active Problem List   Diagnosis Date Noted  . Poisoning by heroin, accidental (unintentional), initial encounter (HCC) 05/08/2017  . Heroin dependence (HCC) 05/08/2017  . Back pain 12/30/2016  . Abnormal LFTs 12/30/2016  . Polysubstance abuse (HCC)   . Cellulitis 11/19/2016  . IV drug user 11/19/2016  . Abscess 11/19/2016  . Delirium   . Tachycardia   . Overdose 07/18/2016  . Drug withdrawal (HCC) 07/16/2016  . AKI (acute kidney injury) (HCC) 07/15/2016  . Alcohol abuse 07/15/2016  . Heroin abuse (HCC) 07/15/2016  . 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 Cocaine abuse (HCC) 07/15/2016  . Acute metabolic encephalopathy 07/15/2016  . Lactic acidosis 03/29/2014  . Sepsis (HCC) 03/27/2014  . Chest pain   . Headache   . Neck pain     Past Surgical History:  Procedure Laterality Date  . I&D EXTREMITY Left 11/19/2016   Procedure: INCISION AND DRAINAGE LEFT ELBOW  ABSCESS;  Surgeon: Myrene Galas, MD;  Location:  [facility] OR;  Service: Orthopedics;  Laterality: Left;      Allergies Patient has no known allergies.  No family history on file.  Social History Social History   Tobacco Use  . Smoking status: Never Smoker  . Smokeless tobacco: Never Used  Substance Use Topics  . Alcohol use: Yes  . Drug use: Yes    Types: IV    Comment: heroin    Review of Systems  Constitutional: No fever/chills Eyes: No visual changes. ENT: No sore throat. Cardiovascular: Denies chest pain. Respiratory: Denies shortness of breath. Gastrointestinal: Abdominal cramping abdominal pain. Positive nausea, no vomiting. Positive diarrhea.  No constipation. Genitourinary: Negative for dysuria. Musculoskeletal: Negative for back pain. Positive body aches.  Skin: Negative for rash. Neurological: Negative for focal weakness or numbness. Positive HA.   10-point ROS otherwise negative.  ____________________________________________   PHYSICAL EXAM:  VITAL SIGNS: ED Triage Vitals  Enc Vitals Group     BP 09/10/17 1557 118/83     Pulse Rate 09/10/17 1557 92     Resp 09/10/17 1557 18     Temp 09/10/17 1557 98.5 F (36.9 C)     Temp Source 09/10/17 1557 Oral     SpO2 09/10/17 1557 100 %     Weight 09/10/17 1559 160 lb (72.6 kg)     Height 09/10/17 1559 5\' 9"  (1.753 m)     Pain Score 09/10/17 1559  10   Constitutional: Alert and oriented. Well appearing and in no acute distress. Eyes: Conjunctivae are normal.  Head: Atraumatic. Nose: No congestion/rhinnorhea. Mouth/Throat: Mucous membranes are moist.  Neck: No stridor.   Cardiovascular: Normal rate, regular rhythm. Good peripheral circulation. Grossly normal heart sounds.   Respiratory: Normal respiratory effort.  No retractions. Lungs CTAB. Gastrointestinal: Soft and nontender. No distention.  Musculoskeletal: No lower extremity tenderness nor edema. No gross deformities of extremities. Neurologic:  Normal speech and language. No gross focal neurologic  deficits are appreciated.  Skin:  Skin is warm, dry and intact. No rash noted.  ____________________________________________   LABS (all labs ordered are listed, but only abnormal results are displayed)  Labs Reviewed  COMPREHENSIVE METABOLIC PANEL - Abnormal; Notable for the following components:      Result Value   Calcium 8.4 (*)    AST 63 (*)    All other components within normal limits  RAPID URINE DRUG SCREEN, HOSP PERFORMED - Abnormal; Notable for the following components:   Opiates POSITIVE (*)    Amphetamines POSITIVE (*)    All other components within normal limits  ETHANOL  CBC   ____________________________________________  RADIOLOGY  None ____________________________________________   PROCEDURES  Procedure(s) performed:   Procedures  None ____________________________________________   INITIAL IMPRESSION / ASSESSMENT AND PLAN / ED COURSE  Pertinent labs & imaging results that were available during my care of the patient were reviewed by me and considered in my medical decision making (see chart for details).  Patient presents to the emergency department for evaluation of heroin detox.  He is having symptoms of moderate withdrawal at this time.  No history of alcohol or benzodiazepine abuse which would necessitate inpatient detox.  I discussed this with him in detail.  I called the peers support counselor at [facility] who will fax me outpatient resources and his contact information for the patient to use for follow-up purposes.  He will have the peer counselor who dealt with him last time call him tomorrow and attempt to help him coordinate care as an outpatient.  Patient reports having Narcan at home already.  Labs ordered from triage reviewed with no acute findings.  At this time, I do not feel there is any life-threatening condition present. I have reviewed and discussed all results (EKG, imaging, lab, urine as appropriate), exam findings with patient. I  have reviewed nursing notes and appropriate previous records.  I feel the patient is safe to be discharged home without further emergent workup. Discussed usual and customary return precautions. Patient and family (if present) verbalize understanding and are comfortable with this plan.  Patient will follow-up with their primary care provider. If they do not have a primary care provider, information for follow-up has been provided to them. All questions have been answered.  ____________________________________________  FINAL CLINICAL IMPRESSION(S) / ED DIAGNOSES  Final diagnoses:  Opioid dependence with withdrawal (HCC)     MEDICATIONS GIVEN DURING THIS VISIT:  Medications  cyclobenzaprine (FLEXERIL) tablet 10 mg (10 mg Oral Given 09/10/17 1832)  dicyclomine (BENTYL) capsule 10 mg (10 mg Oral Given 09/10/17 1832)  ondansetron (ZOFRAN-ODT) disintegrating tablet 4 mg (4 mg Oral Given 09/10/17 1832)     NEW OUTPATIENT MEDICATIONS STARTED DURING THIS VISIT:  Discharge Medication List as of 09/10/2017  6:50 PM    START taking these medications   Details  cloNIDine (CATAPRES) 0.2 MG tablet Take 1 tablet (0.2 mg total) by mouth 2 (two) times daily for 3 days., Starting  Fri 09/10/2017, Until Mon 09/13/2017, Print    cyclobenzaprine (FLEXERIL) 10 MG tablet Take 1 tablet (10 mg total) by mouth 3 (three) times daily as needed for muscle spasms., Starting Fri 09/10/2017, Print    dicyclomine (BENTYL) 20 MG tablet Take 1 tablet (20 mg total) by mouth 2 (two) times daily., Starting Fri 09/10/2017, Print    ondansetron (ZOFRAN ODT) 4 MG disintegrating tablet Take 1 tablet (4 mg total) by mouth every 8 (eight) hours as needed for nausea or vomiting., Starting Fri 09/10/2017, Print        Note:  This document was prepared using Dragon voice recognition software and may include unintentional dictation errors.  Alona BeneJoshua Leyanna Bittman, MD Emergency Medicine    Lidia Clavijo, Arlyss RepressJoshua G, MD 09/10/17 [phone]

## 2017-09-10 NOTE — ED Triage Notes (Signed)
Pt requesting detox from heroin. [facility] and told to come here for detox they don't do it there. States went home and used again this morning. Pt denies si/hi

## 2017-09-14 ENCOUNTER — Telehealth (HOSPITAL_COMMUNITY): Payer: Self-pay

## 2017-10-26 ENCOUNTER — Emergency Department (HOSPITAL_COMMUNITY)
Admission: EM | Admit: 2017-10-26 | Discharge: 2017-10-27 | Disposition: A | Discharge status: Home / Self Care | Payer: Self-pay | Attending: Emergency Medicine | Admitting: Emergency Medicine

## 2017-10-26 ENCOUNTER — Encounter (HOSPITAL_COMMUNITY): Payer: Self-pay | Admitting: *Deleted

## 2017-10-26 ENCOUNTER — Other Ambulatory Visit: Payer: Self-pay

## 2017-10-26 DIAGNOSIS — F17200 Nicotine dependence, unspecified, uncomplicated: Secondary | ICD-10-CM | POA: Insufficient documentation

## 2017-10-26 DIAGNOSIS — F1120 Opioid dependence, uncomplicated: Secondary | ICD-10-CM

## 2017-10-26 DIAGNOSIS — I10 Essential (primary) hypertension: Secondary | ICD-10-CM | POA: Insufficient documentation

## 2017-10-26 DIAGNOSIS — R7401 Elevation of levels of liver transaminase levels: Secondary | ICD-10-CM

## 2017-10-26 DIAGNOSIS — F1410 Cocaine abuse, uncomplicated: Secondary | ICD-10-CM | POA: Insufficient documentation

## 2017-10-26 DIAGNOSIS — R740 Nonspecific elevation of levels of transaminase and lactic acid dehydrogenase [LDH]: Secondary | ICD-10-CM | POA: Insufficient documentation

## 2017-10-26 DIAGNOSIS — F1910 Other psychoactive substance abuse, uncomplicated: Secondary | ICD-10-CM | POA: Insufficient documentation

## 2017-10-26 NOTE — ED Triage Notes (Signed)
Pt stated "[facility] told me to come here for detox.  I need detox from heroin."

## 2017-10-27 ENCOUNTER — Telehealth (HOSPITAL_COMMUNITY): Payer: Self-pay

## 2017-10-27 LAB — RAPID URINE DRUG SCREEN, HOSP PERFORMED
Amphetamines: POSITIVE — AB
Benzodiazepines: NOT DETECTED
Cocaine: POSITIVE — AB
Opiates: POSITIVE — AB
TETRAHYDROCANNABINOL: NOT DETECTED

## 2017-10-27 LAB — CBC WITH DIFFERENTIAL/PLATELET
BASOS ABS: 0.1 10*3/uL (ref 0.0–0.1)
BASOS PCT: 1 %
EOS ABS: 0.6 10*3/uL (ref 0.0–0.7)
Eosinophils Relative: 7 %
HCT: 36.5 % — ABNORMAL LOW (ref 39.0–52.0)
HEMOGLOBIN: 12.3 g/dL — AB (ref 13.0–17.0)
LYMPHS ABS: 3.3 10*3/uL (ref 0.7–4.0)
Lymphocytes Relative: 41 %
MCH: 29.9 pg (ref 26.0–34.0)
MCHC: 33.7 g/dL (ref 30.0–36.0)
MCV: 88.8 fL (ref 78.0–100.0)
Monocytes Absolute: 0.5 10*3/uL (ref 0.1–1.0)
Monocytes Relative: 6 %
NEUTROS PCT: 45 %
Neutro Abs: 3.6 10*3/uL (ref 1.7–7.7)
Platelets: 288 10*3/uL (ref 150–400)
RBC: 4.11 MIL/uL — AB (ref 4.22–5.81)
RDW: 13.7 % (ref 11.5–15.5)
WBC: 8 10*3/uL (ref 4.0–10.5)

## 2017-10-27 LAB — COMPREHENSIVE METABOLIC PANEL
ALT: 199 U/L — AB (ref 0–44)
AST: 168 U/L — AB (ref 15–41)
Albumin: 3.4 g/dL — ABNORMAL LOW (ref 3.5–5.0)
Alkaline Phosphatase: 62 U/L (ref 38–126)
Anion gap: 7 (ref 5–15)
BILIRUBIN TOTAL: 0.4 mg/dL (ref 0.3–1.2)
BUN: 8 mg/dL (ref 6–20)
CHLORIDE: 107 mmol/L (ref 98–111)
CO2: 31 mmol/L (ref 22–32)
CREATININE: 0.92 mg/dL (ref 0.61–1.24)
Calcium: 8.6 mg/dL — ABNORMAL LOW (ref 8.9–10.3)
GFR calc Af Amer: >60 mL/min (ref >60)
Glucose, Bld: 99 mg/dL (ref 70–99)
Potassium: 3.4 mmol/L — ABNORMAL LOW (ref 3.5–5.1)
Sodium: 145 mmol/L (ref 135–145)
Total Protein: 6.9 g/dL (ref 6.5–8.1)

## 2017-10-27 LAB — ACETAMINOPHEN LEVEL: Acetaminophen (Tylenol), Serum: <10 ug/mL — ABNORMAL LOW (ref 10–30)

## 2017-10-27 LAB — SALICYLATE LEVEL

## 2017-10-27 LAB — ETHANOL: ALCOHOL ETHYL (B): 62 mg/dL — AB (ref <10)

## 2017-10-27 NOTE — ED Provider Notes (Signed)
[facility]-EMERGENCY DEPT Provider Note  CSN: [id number] Arrival date & time: 10/26/17 2210  Chief Complaint(s) Addiction Problem  HPI Grant Tapia is a 32 y.o. male with a history of polysubstance abuse and heroin dependence presents to the emergency department for rehabilitation.  Patient reports that he was seen at [facility] and told he can come to [facility] for rehabilitation.  Patient denies any physical complaints.  Denies any suicidal/homicidal ideations.  No auditory/visual hallucinations.  HPI  Past Medical History Past Medical History:  Diagnosis Date  . Heroin abuse (HCC)   . Hypertension   . IV drug user   . Overdose    Patient Active Problem List   Diagnosis Date Noted  . Poisoning by heroin, accidental (unintentional), initial encounter (HCC) 05/08/2017  . Heroin dependence (HCC) 05/08/2017  . Back pain 12/30/2016  . Abnormal LFTs 12/30/2016  . Polysubstance abuse (HCC)   . Cellulitis 11/19/2016  . IV drug user 11/19/2016  . Abscess 11/19/2016  . Delirium   . Tachycardia   . Overdose 07/18/2016  . Drug withdrawal (HCC) 07/16/2016  . AKI (acute kidney injury) (HCC) 07/15/2016  . Alcohol abuse 07/15/2016  . Heroin abuse (HCC) 07/15/2016  . 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 Cocaine abuse (HCC) 07/15/2016  . Acute metabolic encephalopathy 07/15/2016  . Lactic acidosis 03/29/2014  . Sepsis (HCC) 03/27/2014  . Chest pain   . Headache   . Neck pain    Home Medication(s) Prior to Admission medications   Medication Sig Start Date End Date Taking? Authorizing Provider  cloNIDine (CATAPRES) 0.2 MG tablet Take 1 tablet (0.2 mg total) by mouth 2 (two) times daily for 3 days. Patient not taking: Reported on 10/26/2017 09/10/17 09/13/17  Long, Arlyss Repress, MD  cyclobenzaprine (FLEXERIL) 10 MG tablet Take 1 tablet (10 mg total) by mouth 3 (three) times daily as needed for muscle spasms. Patient not taking: Reported on  10/26/2017 09/10/17   Long, Arlyss Repress, MD  dicyclomine (BENTYL) 20 MG tablet Take 1 tablet (20 mg total) by mouth 2 (two) times daily. Patient not taking: Reported on 10/26/2017 09/10/17   Long, Arlyss Repress, MD  ondansetron (ZOFRAN ODT) 4 MG disintegrating tablet Take 1 tablet (4 mg total) by mouth every 8 (eight) hours as needed for nausea or vomiting. Patient not taking: Reported on 10/26/2017 09/10/17   Long, Arlyss Repress, MD                                                                                                                                    Past Surgical History Past Surgical History:  Procedure Laterality Date  . I&D EXTREMITY Left 11/19/2016   Procedure: INCISION AND DRAINAGE LEFT ELBOW  ABSCESS;  Surgeon: Myrene Galas, MD;  Location: [facility] OR;  Service: Orthopedics;  Laterality: Left;   Family History History reviewed. No pertinent family history.  Social History Social History  Tobacco Use  . Smoking status: Current Some Day Smoker  . Smokeless tobacco: Never Used  Substance Use Topics  . Alcohol use: Yes  . Drug use: Yes    Types: IV    Comment: heroin   Allergies Patient has no known allergies.  Review of Systems Review of Systems All other systems are reviewed and are negative for acute change except as noted in the HPI  Physical Exam Vital Signs  I have reviewed the triage vital signs BP (!) 123/94 (BP Location: Left Arm)   Pulse 91   Temp 98.7 F (37.1 C) (Oral)   Resp 16   Ht 5\' 11"  (1.803 m)   Wt 74.8 kg (165 lb)   SpO2 100%   BMI 23.01 kg/m   Physical Exam  Constitutional: He is oriented to person, place, and time. He appears well-developed and well-nourished. No distress.  HENT:  Head: Normocephalic and atraumatic.  Right Ear: External ear normal.  Left Ear: External ear normal.  Nose: Nose normal.  Mouth/Throat: Mucous membranes are normal. No trismus in the jaw.  Eyes: Conjunctivae and EOM are normal. No scleral icterus.  Neck: Normal range of  motion and phonation normal.  Cardiovascular: Normal rate and regular rhythm.  Pulmonary/Chest: Effort normal. No stridor. No respiratory distress.  Abdominal: He exhibits no distension.  Musculoskeletal: Normal range of motion. He exhibits no edema.  Neurological: He is alert and oriented to person, place, and time.  Skin: He is not diaphoretic.  Psychiatric: He has a normal mood and affect. His behavior is normal.  Vitals reviewed.   ED Results and Treatments Labs (all labs ordered are listed, but only abnormal results are displayed) Labs Reviewed  COMPREHENSIVE METABOLIC PANEL - Abnormal; Notable for the following components:      Result Value   Potassium 3.4 (*)    Calcium 8.6 (*)    Albumin 3.4 (*)    AST 168 (*)    ALT 199 (*)    All other components within normal limits  ETHANOL - Abnormal; Notable for the following components:   Alcohol, Ethyl (B) 62 (*)    All other components within normal limits  CBC WITH DIFFERENTIAL/PLATELET - Abnormal; Notable for the following components:   RBC 4.11 (*)    Hemoglobin 12.3 (*)    HCT 36.5 (*)    All other components within normal limits  ACETAMINOPHEN LEVEL - Abnormal; Notable for the following components:   Acetaminophen (Tylenol), Serum <10 (*)    All other components within normal limits  SALICYLATE LEVEL  RAPID URINE DRUG SCREEN, HOSP PERFORMED                                                                                                                         EKG  EKG Interpretation  Date/Time:    Ventricular Rate:    PR Interval:    QRS Duration:   QT Interval:    QTC Calculation:   R Axis:  Text Interpretation:        Radiology No results found. Pertinent labs & imaging results that were available during my care of the patient were reviewed by me and considered in my medical decision making (see chart for details).  Medications Ordered in ED Medications - No data to display                                                                                                                                   Procedures Procedures  (including critical care time)  Medical Decision Making / ED Course I have reviewed the nursing notes for this encounter and the patient's prior records (if available in EHR or on provided paperwork).    Medical screening labs were obtained in triage.  Notable elevated EtOH.  Patient is currently clinically sober.  Also notable for transaminitis.  Patient is no immediate threat to himself or others.  Peers support consult was ordered.  Patient will be provided with resources for outpatient counseling and rehab centers.  The patient appears reasonably screened and/or stabilized for discharge and I doubt any other medical condition or other [facility] requiring further screening, evaluation, or treatment in the ED at this time prior to discharge.  The patient is safe for discharge with strict return precautions.   Final Clinical Impression(s) / ED Diagnoses Final diagnoses:  Polysubstance abuse (HCC)  Uncomplicated opioid dependence (HCC)  Transaminitis   Disposition: Discharge  Condition: Good  I have discussed the results, Dx and Tx plan with the patient who expressed understanding and agree(s) with the plan. Discharge instructions discussed at great length. The patient was given strict return precautions who verbalized understanding of the instructions. No further questions at time of discharge.    ED Discharge Orders    None       Follow Up: Outpatient counseling/rehab facilities         This chart was dictated using voice recognition software.  Despite best efforts to proofread,  errors can occur which can change the documentation meaning.   Nira Conn, MD 10/27/17 [phone]

## 2018-10-02 ENCOUNTER — Other Ambulatory Visit: Payer: Self-pay

## 2018-10-02 ENCOUNTER — Encounter (HOSPITAL_BASED_OUTPATIENT_CLINIC_OR_DEPARTMENT_OTHER): Payer: Self-pay | Admitting: *Deleted

## 2018-10-02 ENCOUNTER — Emergency Department (HOSPITAL_BASED_OUTPATIENT_CLINIC_OR_DEPARTMENT_OTHER)
Admission: EM | Admit: 2018-10-02 | Discharge: 2018-10-02 | Disposition: A | Discharge status: Home / Self Care | Payer: Self-pay | Attending: Emergency Medicine | Admitting: Emergency Medicine

## 2018-10-02 DIAGNOSIS — I10 Essential (primary) hypertension: Secondary | ICD-10-CM | POA: Insufficient documentation

## 2018-10-02 DIAGNOSIS — G8929 Other chronic pain: Secondary | ICD-10-CM | POA: Insufficient documentation

## 2018-10-02 DIAGNOSIS — Z87891 Personal history of nicotine dependence: Secondary | ICD-10-CM | POA: Insufficient documentation

## 2018-10-02 DIAGNOSIS — K089 Disorder of teeth and supporting structures, unspecified: Secondary | ICD-10-CM | POA: Insufficient documentation

## 2018-10-02 MED ORDER — LIDOCAINE VISCOUS HCL 2 % MT SOLN
15.0000 mL | Freq: Once | OROMUCOSAL | Status: AC
  Administered 2018-10-02: 15 mL via OROMUCOSAL
  Filled 2018-10-02: qty 15

## 2018-10-02 MED ORDER — PENICILLIN V POTASSIUM 500 MG PO TABS: 500.0000 mg | ORAL_TABLET | Freq: Four times a day (QID) | ORAL | 0 refills | Status: AC

## 2018-10-02 MED ORDER — LIDOCAINE VISCOUS HCL 2 % MT SOLN: 15.0000 mL | OROMUCOSAL | 0 refills | Status: DC | PRN

## 2018-10-02 MED ORDER — CHLORHEXIDINE GLUCONATE 0.12 % MT SOLN: 15.0000 mL | Freq: Two times a day (BID) | OROMUCOSAL | 0 refills | Status: DC

## 2018-10-02 MED ORDER — BUPIVACAINE-EPINEPHRINE (PF) 0.5% -1:200000 IJ SOLN
1.8000 mL | Freq: Once | INTRAMUSCULAR | Status: AC
  Administered 2018-10-02: 1.8 mL
  Filled 2018-10-02: qty 1.8

## 2018-10-02 MED ORDER — PENICILLIN V POTASSIUM 250 MG PO TABS
500.0000 mg | ORAL_TABLET | Freq: Once | ORAL | Status: AC
  Administered 2018-10-02: 500 mg via ORAL
  Filled 2018-10-02: qty 2

## 2018-10-02 NOTE — ED Provider Notes (Signed)
[facility] EMERGENCY DEPARTMENT Provider Note   CSN: [id number] Arrival date & time: 10/02/18  1837     History   Chief Complaint Chief Complaint  Patient presents with  . Dental Pain    HPI Grant HarpsLouis Nathaniel Marken is a 33 y.o. male.     HPI   Grant HarpsLouis Nathaniel Rane is a 33 y.o. male who presents to the Emergency Department complaining of persistent, gradually worsening, right upper and left lower dental pain beginning 2-3 months ago but worsening recently.  He reports that tonight he was going out to eat and his pain was too severe to eat dinner.  Pt describes their pain as throbbing. Pt has been taking acetaminophen at home with minimal relief of pain. They are not currently followed by dentistry.  Pt denies facial swelling, fever, chills, difficulty breathing, difficulty swallowing.   Past Medical History:  Diagnosis Date  . Heroin abuse (HCC)   . Hypertension   . IV drug user   . Overdose     Patient Active Problem List   Diagnosis Date Noted  . Poisoning by heroin, accidental (unintentional), initial encounter (HCC) 05/08/2017  . Heroin dependence (HCC) 05/08/2017  . Back pain 12/30/2016  . Abnormal LFTs 12/30/2016  . Polysubstance abuse (HCC)   . Cellulitis 11/19/2016  . IV drug user 11/19/2016  . Abscess 11/19/2016  . Delirium   . Tachycardia   . Overdose 07/18/2016  . Drug withdrawal (HCC) 07/16/2016  . AKI (acute kidney injury) (HCC) 07/15/2016  . Alcohol abuse 07/15/2016  . Heroin abuse (HCC) 07/15/2016  . 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 Cocaine abuse (HCC) 07/15/2016  . Acute metabolic encephalopathy 07/15/2016  . Lactic acidosis 03/29/2014  . Sepsis (HCC) 03/27/2014  . Chest pain   . Headache   . Neck pain     Past Surgical History:  Procedure Laterality Date  . I&D EXTREMITY Left 11/19/2016   Procedure: INCISION AND DRAINAGE LEFT ELBOW  ABSCESS;  Surgeon: Myrene GalasHandy, Michael, MD;  Location: [facility] OR;  Service: Orthopedics;   Laterality: Left;        Home Medications    Prior to Admission medications   Medication Sig Start Date End Date Taking? Authorizing Provider  cloNIDine (CATAPRES) 0.2 MG tablet Take 1 tablet (0.2 mg total) by mouth 2 (two) times daily for 3 days. Patient not taking: Reported on 10/26/2017 09/10/17 09/13/17  Long, Arlyss RepressJoshua G, MD  cyclobenzaprine (FLEXERIL) 10 MG tablet Take 1 tablet (10 mg total) by mouth 3 (three) times daily as needed for muscle spasms. Patient not taking: Reported on 10/26/2017 09/10/17   Long, Arlyss RepressJoshua G, MD  dicyclomine (BENTYL) 20 MG tablet Take 1 tablet (20 mg total) by mouth 2 (two) times daily. Patient not taking: Reported on 10/26/2017 09/10/17   Long, Arlyss RepressJoshua G, MD  ondansetron (ZOFRAN ODT) 4 MG disintegrating tablet Take 1 tablet (4 mg total) by mouth every 8 (eight) hours as needed for nausea or vomiting. Patient not taking: Reported on 10/26/2017 09/10/17   Long, Arlyss RepressJoshua G, MD    Family History No family history on file.  Social History Social History   Tobacco Use  . Smoking status: Former Smoker    Quit date: 12/12/2017    Years since quitting: 0.8  . Smokeless tobacco: Never Used  Substance Use Topics  . Alcohol use: Yes    Comment: occasional wine  . Drug use: Not Currently    Types: IV    Comment: heroin  Allergies   Patient has no known allergies.   Review of Systems Review of Systems  Constitutional: Negative for chills and fever.  HENT: Positive for dental problem. Negative for sore throat, trouble swallowing and voice change.   Respiratory: Negative for stridor.      Physical Exam Updated Vital Signs BP (!) 132/99 (BP Location: Right Arm)   Pulse 88   Temp 98.5 F (36.9 C) (Oral)   Resp 18   Ht 5\' 10"  (1.778 m)   Wt 86.2 kg   SpO2 97%   BMI 27.26 kg/m   Physical Exam Vitals signs and nursing note reviewed.  Constitutional:      General: He is not in acute distress.    Appearance: He is well-developed. He is not diaphoretic.      Comments: Sitting comfortably in bed.  HENT:     Head: Normocephalic and atraumatic.     Mouth/Throat:      Comments: Dental cavities and poor oral dentition noted. Pain along teeth as depicted in image. No abscess noted. Midline uvula. No trismus. OP clear and moist. No oropharyngeal erythema or edema. Neck supple with no tenderness. No facial edema. Eyes:     General:        Right eye: No discharge.        Left eye: No discharge.     Conjunctiva/sclera: Conjunctivae normal.     Comments: EOMs normal to gross examination.  Neck:     Musculoskeletal: Normal range of motion.  Cardiovascular:     Rate and Rhythm: Normal rate and regular rhythm.     Comments: Intact, 2+ radial pulse. Pulmonary:     Comments: Converses comfortably.  No audible wheeze or stridor. Abdominal:     General: There is no distension.  Musculoskeletal: Normal range of motion.  Skin:    General: Skin is warm and dry.  Neurological:     Mental Status: He is alert.     Comments: Cranial nerves intact to gross observation. Patient moves extremities without difficulty.  Psychiatric:        Behavior: Behavior normal.        Thought Content: Thought content normal.        Judgment: Judgment normal.      ED Treatments / Results  Labs (all labs ordered are listed, but only abnormal results are displayed) Labs Reviewed - No data to display  EKG    Radiology No results found.  Procedures Dental Block  Date/Time: 10/02/2018 10:15 PM Performed by: Albesa Seen, PA-C Authorized by: Albesa Seen, PA-C   Consent:    Consent obtained:  Verbal   Consent given by:  Patient   Risks discussed:  Swelling, unsuccessful block and pain   Alternatives discussed:  No treatment Indications:    Indications: dental pain   Location:    Block type:  Supraperiosteal   Supraperiosteal location:  Lower teeth   Lower teeth location:  17/LL 3rd molar Procedure details (see MAR for exact dosages):     Topical anesthetic:  Benzocaine gel   Syringe type:  Aspirating dental syringe   Needle gauge:  25 G   Anesthetic injected:  Bupivacaine 0.5% WITH epi   Injection procedure:  Anatomic landmarks identified, introduced needle, incremental injection, negative aspiration for blood and anatomic landmarks palpated Post-procedure details:    Outcome:  Anesthesia achieved   Patient tolerance of procedure:  Tolerated well, no immediate complications   (including critical care time)  Medications Ordered in  ED Medications  penicillin v potassium (VEETID) tablet 500 mg (has no administration in time range)  lidocaine (XYLOCAINE) 2 % viscous mouth solution 15 mL (has no administration in time range)     Initial Impression / Assessment and Plan / ED Course  I have reviewed the triage vital signs and the nursing notes.  Pertinent labs & imaging results that were available during my care of the patient were reviewed by me and considered in my medical decision making (see chart for details).        Grant Tapia is a 33 y.o. male who presents to ED for dental pain. No abscess requiring immediate incision and drainage. Patient is afebrile, non toxic appearing, and swallowing secretions well. Exam not concerning for Ludwig's angina or pharyngeal abscess. Will treat with Penicillin, viscous lidocaine, and chlorhexidine rinses. I provided dental resource guide and stressed the importance of dental follow up for ultimate management of dental pain. Patient voices understanding and is agreeable to plan.  Final Clinical Impressions(s) / ED Diagnoses   Final diagnoses:  Chronic dental pain    ED Discharge Orders         Ordered    penicillin v potassium (VEETID) 500 MG tablet  4 times daily     10/02/18 1941    lidocaine (XYLOCAINE) 2 % solution  As needed     10/02/18 1941    chlorhexidine (PERIDEX) 0.12 % solution  2 times daily     10/02/18 1941           Delia ChimesMurray, Alyssa B, PA-C  10/02/18 2215    Maia PlanLong, Joshua G, MD 10/03/18 1306

## 2018-10-02 NOTE — ED Notes (Signed)
ED Provider at bedside. 

## 2018-10-02 NOTE — Discharge Instructions (Addendum)
Please see the information and instructions below regarding your visit.  Your diagnoses today include:  1. Chronic dental pain    You have a dental infection. It is very important that you get evaluated by a dentist as soon as possible. Call tomorrow to schedule an appointment. Ibuprofen as needed for pain. Take your full course of antibiotics. Read the instructions below.  Tests performed today include: See side panel of your discharge paperwork for testing performed today. Vital signs are listed at the bottom of these instructions.   Medications prescribed:    Take any prescribed medications only as prescribed, and any over the counter medications only as directed on the packaging.  1. You are prescribed Penicillin, an antibiotic. Please take all of your antibiotics until finished.   You may develop abdominal discomfort or nausea from the antibiotic. If this occurs, you may take it with food. Some patients also get diarrhea with antibiotics. You may help offset this with probiotics which you can buy or get in yogurt. Do not eat or take the probiotics until 2 hours after your antibiotic. Some women develop vaginal yeast infections after antibiotics. If you develop unusual vaginal discharge after being on this medication, please see your primary care provider.   Some people develop allergies to antibiotics. Symptoms of antibiotic allergy can be mild and include a flat rash and itching. They can also be more serious and include:  ?Hives - Hives are raised, red patches of skin that are usually very itchy.  ?Lip or tongue swelling  ?Trouble swallowing or breathing  ?Blistering of the skin or mouth.  If you have any of these serious symptoms, please seek emergency medical care immediately.  2. I recommend ibuprofen , a non-steroidal anti-inflammatory agent (NSAID) for pain. You may take mg every 6 hours as needed for pain. If still requiring this medication around the clock for acute pain  after 10 days, please see your primary healthcare provider.  You may combine this medication with Tylenol, 650 mg every 6 hours, so you are receiving something for pain every 3 hours.  This is not a long-term medication unless under the care and direction of your primary provider. Taking this medication long-term and not under the supervision of a healthcare provider could increase the risk of stomach ulcers, kidney problems, and cardiovascular problems such as high blood pressure.   3. You are prescribed viscous lidocaine swish and spit every 6 hours as needed for tooth and gum discomfort. Do not swallow.   4. You are prescribed chlorhexidine solution swish and spit twice daily. Do not swallow. This is to clear bacteria from your mouth.    Home care instructions:  Please follow any educational materials contained in this packet.   Eat a soft or liquid diet and rinse your mouth out after meals with warm water. You should see a dentist or return here at once if you have increased swelling, increased pain or uncontrolled bleeding from the site of your injury.  Follow-up instructions: It is very important that you see a dentist as soon as possible. There is a list of dentists attached to this packet if you do not have care established with a dentist already. Please give a call to a dentist of your choice tomorrow.  Return instructions:  Please return to the Emergency Department if you experience worsening symptoms.  Please seek care if you note any of the following about your dental pain:  You have increased pain not controlled with medicines.  You have swelling around your tooth, in your face or neck.  You have bleeding which starts, continues, or gets worse.  You have a fever >101 If you are unable to open your mouth Please return if you have any other emergent concerns.  Additional Information:   Your vital signs today were: BP (!) 132/99 (BP Location: Right Arm)    Pulse 88    Temp  98.5 F (36.9 C) (Oral)    Resp 18    Ht 5\' 10"  (1.778 m)    Wt 86.2 kg    SpO2 97%    BMI 27.26 kg/m  If your blood pressure (BP) was elevated on multiple readings during this visit above 130 for the top number or above 80 for the bottom number, please have this repeated by your primary care provider within one month. --------------  Thank you for allowing us to participate in your care today.

## 2018-10-02 NOTE — ED Triage Notes (Signed)
Pt reports dental pain for a while. Pain worse today. Right upper molar is broken. He has lower wisdom teeth erupting which are also painful, worse on left side. States he was tested for covid in March and was negative. Denies cough, fever, sob.

## 2018-10-02 NOTE — ED Notes (Signed)
Pt given Rx x 3 for penicillin, viscous lidocaine, and peridex

## 2018-11-27 ENCOUNTER — Emergency Department (HOSPITAL_BASED_OUTPATIENT_CLINIC_OR_DEPARTMENT_OTHER)
Admission: EM | Admit: 2018-11-27 | Discharge: 2018-11-27 | Disposition: A | Discharge status: Home / Self Care | Payer: Self-pay | Attending: Emergency Medicine | Admitting: Emergency Medicine

## 2018-11-27 ENCOUNTER — Other Ambulatory Visit: Payer: Self-pay

## 2018-11-27 ENCOUNTER — Encounter (HOSPITAL_BASED_OUTPATIENT_CLINIC_OR_DEPARTMENT_OTHER): Payer: Self-pay | Admitting: Emergency Medicine

## 2018-11-27 DIAGNOSIS — Z87891 Personal history of nicotine dependence: Secondary | ICD-10-CM | POA: Insufficient documentation

## 2018-11-27 DIAGNOSIS — K0889 Other specified disorders of teeth and supporting structures: Secondary | ICD-10-CM | POA: Insufficient documentation

## 2018-11-27 DIAGNOSIS — I10 Essential (primary) hypertension: Secondary | ICD-10-CM | POA: Insufficient documentation

## 2018-11-27 MED ORDER — CLINDAMYCIN HCL 300 MG PO CAPS: 300.0000 mg | ORAL_CAPSULE | Freq: Three times a day (TID) | ORAL | 0 refills | Status: AC

## 2018-11-27 MED ORDER — KETOROLAC TROMETHAMINE 60 MG/2ML IM SOLN
60.0000 mg | Freq: Once | INTRAMUSCULAR | Status: AC
  Administered 2018-11-27: 60 mg via INTRAMUSCULAR
  Filled 2018-11-27: qty 2

## 2018-11-27 NOTE — ED Provider Notes (Signed)
[facility] EMERGENCY DEPARTMENT Provider Note   CSN: [id number] Arrival date & time: 11/27/18  0630     History   Chief Complaint Chief Complaint  Patient presents with  . Dental Pain    HPI Grant Tapia is a 33 y.o. male.     The history is provided by the patient.  Dental Pain Location:  Lower Quality:  Aching Severity:  Mild Onset quality:  Gradual Timing:  Intermittent Progression:  Waxing and waning Chronicity:  Recurrent Context comment:  Wisdom tooth pain Relieved by:  Nothing Worsened by:  Nothing Associated symptoms: no congestion, no difficulty swallowing, no drooling, no facial pain, no facial swelling, no fever, no gum swelling, no headaches, no neck pain, no neck swelling, no oral bleeding, no oral lesions and no trismus   Risk factors: lack of dental care   Risk factors: no periodontal disease     Past Medical History:  Diagnosis Date  . Heroin abuse (HCC)   . Hypertension   . IV drug user   . Overdose     Patient Active Problem List   Diagnosis Date Noted  . Poisoning by heroin, accidental (unintentional), initial encounter (HCC) 05/08/2017  . Heroin dependence (HCC) 05/08/2017  . Back pain 12/30/2016  . Abnormal LFTs 12/30/2016  . Polysubstance abuse (HCC)   . Cellulitis 11/19/2016  . IV drug user 11/19/2016  . Abscess 11/19/2016  . Delirium   . Tachycardia   . Overdose 07/18/2016  . Drug withdrawal (HCC) 07/16/2016  . AKI (acute kidney injury) (HCC) 07/15/2016  . Alcohol abuse 07/15/2016  . Heroin abuse (HCC) 07/15/2016  . 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 Cocaine abuse (HCC) 07/15/2016  . Acute metabolic encephalopathy 07/15/2016  . Lactic acidosis 03/29/2014  . Sepsis (HCC) 03/27/2014  . Chest pain   . Headache   . Neck pain     Past Surgical History:  Procedure Laterality Date  . I&D EXTREMITY Left 11/19/2016   Procedure: INCISION AND DRAINAGE LEFT ELBOW  ABSCESS;  Surgeon: Myrene GalasHandy, Michael, MD;   Location: [facility] OR;  Service: Orthopedics;  Laterality: Left;        Home Medications    Prior to Admission medications   Medication Sig Start Date End Date Taking? Authorizing Provider  chlorhexidine (PERIDEX) 0.12 % solution Use as directed 15 mLs in the mouth or throat 2 (two) times daily. Swish and spit.  Do not swallow. 10/02/18   Aviva KluverMurray, Alyssa B, PA-C  clindamycin (CLEOCIN) 300 MG capsule Take 1 capsule (300 mg total) by mouth 3 (three) times daily for 7 days. 11/27/18 12/04/18  Raliegh Scobie, DO  cloNIDine (CATAPRES) 0.2 MG tablet Take 1 tablet (0.2 mg total) by mouth 2 (two) times daily for 3 days. Patient not taking: Reported on 10/26/2017 09/10/17 09/13/17  Long, Arlyss RepressJoshua G, MD  cyclobenzaprine (FLEXERIL) 10 MG tablet Take 1 tablet (10 mg total) by mouth 3 (three) times daily as needed for muscle spasms. Patient not taking: Reported on 10/26/2017 09/10/17   Long, Arlyss RepressJoshua G, MD  dicyclomine (BENTYL) 20 MG tablet Take 1 tablet (20 mg total) by mouth 2 (two) times daily. Patient not taking: Reported on 10/26/2017 09/10/17   Long, Arlyss RepressJoshua G, MD  lidocaine (XYLOCAINE) 2 % solution Use as directed 15 mLs in the mouth or throat as needed for mouth pain. Swish and spit.  Do not swallow. 10/02/18   Aviva KluverMurray, Alyssa B, PA-C  ondansetron (ZOFRAN ODT) 4 MG disintegrating tablet Take 1 tablet (4 mg  total) by mouth every 8 (eight) hours as needed for nausea or vomiting. Patient not taking: Reported on 10/26/2017 09/10/17   Long, Arlyss RepressJoshua G, MD    Family History No family history on file.  Social History Social History   Tobacco Use  . Smoking status: Former Smoker    Quit date: 12/12/2017    Years since quitting: 0.9  . Smokeless tobacco: Never Used  Substance Use Topics  . Alcohol use: Yes    Comment: occasional wine  . Drug use: Not Currently    Types: IV    Comment: heroin     Allergies   Patient has no known allergies.   Review of Systems Review of Systems  Constitutional: Negative for fever.   HENT: Negative for congestion, drooling, facial swelling and mouth sores.   Musculoskeletal: Negative for neck pain.  Neurological: Negative for headaches.     Physical Exam Updated Vital Signs BP (!) 160/108   Pulse 72   Temp 98.4 F (36.9 C)   Resp 18   Ht 5\' 10"  (1.778 m)   Wt 81.6 kg   SpO2 100%   BMI 25.83 kg/m   Physical Exam Vitals signs and nursing note reviewed.  Constitutional:      Appearance: He is well-developed.  HENT:     Head: Normocephalic and atraumatic.     Comments: Overall good dentition, appears to have some lower impacted wisdom teethn Eyes:     Conjunctiva/sclera: Conjunctivae normal.  Neck:     Musculoskeletal: Neck supple.  Cardiovascular:     Rate and Rhythm: Normal rate and regular rhythm.     Heart sounds: No murmur.  Pulmonary:     Effort: Pulmonary effort is normal. No respiratory distress.     Breath sounds: Normal breath sounds.  Abdominal:     Palpations: Abdomen is soft.     Tenderness: There is no abdominal tenderness.  Skin:    General: Skin is warm and dry.  Neurological:     Mental Status: He is alert.      ED Treatments / Results  Labs (all labs ordered are listed, but only abnormal results are displayed) Labs Reviewed - No data to display  EKG None  Radiology No results found.  Procedures Procedures (including critical care time)  Medications Ordered in ED Medications  ketorolac (TORADOL) injection 60 mg (has no administration in time range)     Initial Impression / Assessment and Plan / ED Course  I have reviewed the triage vital signs and the nursing notes.  Pertinent labs & imaging results that were available during my care of the patient were reviewed by me and considered in my medical decision making (see chart for details).        Grant Tapia is a 33 year old male who presents to the ED with lower dental pain.  Patient with normal vitals.  No fever.  Patient with ongoing lower  dental pain intermittently.  Appears to have likely impacted wisdom teeth.  No signs of obvious infection but will cover with clindamycin.  Was given IM Toradol for acute pain control.  Recommend continue use of Tylenol/Motrin.  No other concern for severe infectious process.  Given return precautions.  Given information for dental follow-up and discharged in the ED in good condition.  This chart was dictated using voice recognition software.  Despite best efforts to proofread,  errors can occur which can change the documentation meaning.   Final Clinical Impressions(s) / ED Diagnoses  Final diagnoses:  Pain, dental    ED Discharge Orders         Ordered    clindamycin (CLEOCIN) 300 MG capsule  3 times daily     11/27/18 0719           Lennice Sites, DO 11/27/18 5631

## 2018-11-27 NOTE — ED Triage Notes (Signed)
Pt states that his wisdom teeth are coming in. States he has a molar with a hole in it. States the pain is behind his jaw and feels in his year. Pain started 2-3 days ago. Tried goody powder, tylenol, and tylenol pm. Last dose of tylenol pm was 0100-0200

## 2018-12-30 ENCOUNTER — Emergency Department (HOSPITAL_COMMUNITY): Admission: EM | Admit: 2018-12-30 | Discharge: 2018-12-30 | Discharge status: Against Medical Advice | Payer: Self-pay

## 2018-12-30 NOTE — ED Notes (Signed)
Called for triage and unable to loctate

## 2019-01-05 ENCOUNTER — Other Ambulatory Visit: Payer: Self-pay

## 2019-01-05 ENCOUNTER — Encounter (HOSPITAL_BASED_OUTPATIENT_CLINIC_OR_DEPARTMENT_OTHER): Payer: Self-pay | Admitting: Emergency Medicine

## 2019-01-05 ENCOUNTER — Emergency Department (HOSPITAL_BASED_OUTPATIENT_CLINIC_OR_DEPARTMENT_OTHER): Payer: Self-pay

## 2019-01-05 ENCOUNTER — Emergency Department (HOSPITAL_BASED_OUTPATIENT_CLINIC_OR_DEPARTMENT_OTHER)
Admission: EM | Admit: 2019-01-05 | Discharge: 2019-01-05 | Disposition: A | Discharge status: Home / Self Care | Payer: Self-pay | Attending: Emergency Medicine | Admitting: Emergency Medicine

## 2019-01-05 DIAGNOSIS — M898X1 Other specified disorders of bone, shoulder: Secondary | ICD-10-CM | POA: Insufficient documentation

## 2019-01-05 DIAGNOSIS — Z87891 Personal history of nicotine dependence: Secondary | ICD-10-CM | POA: Insufficient documentation

## 2019-01-05 DIAGNOSIS — R52 Pain, unspecified: Secondary | ICD-10-CM

## 2019-01-05 DIAGNOSIS — I10 Essential (primary) hypertension: Secondary | ICD-10-CM | POA: Insufficient documentation

## 2019-01-05 HISTORY — DX: Other psychoactive substance abuse, uncomplicated: F19.10

## 2019-01-05 MED ORDER — LIDOCAINE 5 % EX PTCH: 1.0000 | MEDICATED_PATCH | CUTANEOUS | 0 refills | Status: DC

## 2019-01-05 MED ORDER — DICLOFENAC SODIUM 1 % TD GEL: 4.0000 g | Freq: Four times a day (QID) | TRANSDERMAL | 0 refills | Status: DC

## 2019-01-05 MED ORDER — KETOROLAC TROMETHAMINE 60 MG/2ML IM SOLN
30.0000 mg | Freq: Once | INTRAMUSCULAR | Status: AC
  Administered 2019-01-05: 30 mg via INTRAMUSCULAR
  Filled 2019-01-05: qty 2

## 2019-01-05 NOTE — ED Provider Notes (Signed)
[facility] Provider Note   CSN: [id number] Arrival date & time: 01/05/19  0145     History   Chief Complaint No chief complaint on file.   HPI Grant Tapia is a 33 y.o. male.     The history is provided by the patient.  Hip Pain This is a new problem. The current episode started more than 1 week ago. The problem occurs constantly. The problem has not changed since onset.Pertinent negatives include no chest pain, no abdominal pain, no headaches and no shortness of breath. Associated symptoms comments: Also has pain in the left clavicle. Nothing aggravates the symptoms. Nothing relieves the symptoms. He has tried acetaminophen for the symptoms. The treatment provided no relief.  Pain and "swelling" of the left clavicle and hip x 1 week.  No f/c/r.  No weakness.  No Cough. No SOB. No DOE.  No gait abnormality.  No trauma.  No rashes on the skin.  No abscesses.    Past Medical History:  Diagnosis Date  . Heroin abuse ([place])   . Hypertension   . IV drug user   . Overdose     Patient Active Problem List   Diagnosis Date Noted  . Poisoning by heroin, accidental (unintentional), initial encounter ([place]) 05/08/2017  . Heroin dependence ([place]) 05/08/2017  . Back pain 12/30/2016  . Abnormal LFTs 12/30/2016  . Polysubstance abuse ([place])   . Cellulitis 11/19/2016  . IV drug user 11/19/2016  . Abscess 11/19/2016  . Delirium   . Tachycardia   . Overdose 07/18/2016  . Drug withdrawal ([place]) 07/16/2016  . AKI (acute kidney injury) ([place]) 07/15/2016  . Alcohol abuse 07/15/2016  . Heroin abuse ([place]) 07/15/2016  . 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 Cocaine abuse ([place]) 07/15/2016  . Acute metabolic encephalopathy 24/23/5361  . Lactic acidosis 03/29/2014  . Sepsis ([place]) 03/27/2014  . Chest pain   . Headache   . Neck pain     Past Surgical History:  Procedure Laterality Date  . I&D EXTREMITY Left 11/19/2016   Procedure: INCISION AND  DRAINAGE LEFT ELBOW  ABSCESS;  Surgeon: Altamese [place], MD;  Location: [place];  Service: Orthopedics;  Laterality: Left;        Home Medications    Prior to Admission medications   Medication Sig Start Date End Date Taking? Authorizing Provider  chlorhexidine (PERIDEX) 0.12 % solution Use as directed 15 mLs in the mouth or throat 2 (two) times daily. Swish and spit.  Do not swallow. 10/02/18   Langston Masker B, PA-C  cloNIDine (CATAPRES) 0.2 MG tablet Take 1 tablet (0.2 mg total) by mouth 2 (two) times daily for 3 days. Patient not taking: Reported on 10/26/2017 09/10/17 09/13/17  Long, Wonda Olds, MD  cyclobenzaprine (FLEXERIL) 10 MG tablet Take 1 tablet (10 mg total) by mouth 3 (three) times daily as needed for muscle spasms. Patient not taking: Reported on 10/26/2017 09/10/17   Long, Wonda Olds, MD  dicyclomine (BENTYL) 20 MG tablet Take 1 tablet (20 mg total) by mouth 2 (two) times daily. Patient not taking: Reported on 10/26/2017 09/10/17   Long, Wonda Olds, MD  lidocaine (XYLOCAINE) 2 % solution Use as directed 15 mLs in the mouth or throat as needed for mouth pain. Swish and spit.  Do not swallow. 10/02/18   Langston Masker B, PA-C  ondansetron (ZOFRAN ODT) 4 MG disintegrating tablet Take 1 tablet (4 mg total) by mouth every 8 (eight) hours as needed for nausea or  vomiting. Patient not taking: Reported on 10/26/2017 09/10/17   Long, Arlyss Repress, MD    Family History No family history on file.  Social History Social History   Tobacco Use  . Smoking status: Former Smoker    Quit date: 12/12/2017    Years since quitting: 1.0  . Smokeless tobacco: Never Used  Substance Use Topics  . Alcohol use: Yes    Comment: occasional wine  . Drug use: Not Currently    Types: IV    Comment: heroin     Allergies   Patient has no known allergies.   Review of Systems Review of Systems  Constitutional: Negative for chills, diaphoresis, fatigue and fever.  HENT: Negative for congestion and sore throat.    Eyes: Negative for visual disturbance.  Respiratory: Negative for cough, chest tightness, shortness of breath, wheezing and stridor.   Cardiovascular: Negative for chest pain.  Gastrointestinal: Negative for abdominal pain.  Genitourinary: Negative for difficulty urinating and dysuria.  Musculoskeletal: Positive for arthralgias. Negative for back pain, gait problem, joint swelling, neck pain and neck stiffness.  Skin: Negative for color change and rash.  Neurological: Negative for weakness, numbness and headaches.  Hematological: Negative for adenopathy.  Psychiatric/Behavioral: Negative for agitation.  All other systems reviewed and are negative.    Physical Exam Updated Vital Signs There were no vitals taken for this visit.  Physical Exam Vitals signs and nursing note reviewed.  Constitutional:      General: He is not in acute distress.    Appearance: He is normal weight.  HENT:     Head: Normocephalic and atraumatic.     Nose: Nose normal.  Eyes:     Conjunctiva/sclera: Conjunctivae normal.     Pupils: Pupils are equal, round, and reactive to light.  Neck:     Musculoskeletal: Normal range of motion and neck supple. No neck rigidity.  Cardiovascular:     Rate and Rhythm: Normal rate and regular rhythm.     Pulses: Normal pulses.     Heart sounds: Normal heart sounds.  Pulmonary:     Effort: Pulmonary effort is normal.     Breath sounds: Normal breath sounds.  Abdominal:     General: Abdomen is flat. Bowel sounds are normal.     Palpations: Abdomen is soft.     Tenderness: There is no abdominal tenderness. There is no guarding.  Musculoskeletal: Normal range of motion.        General: No swelling.     Left shoulder: Normal.     Left hip: Normal.     Left knee: Normal.     Cervical back: Normal.     Thoracic back: Normal.     Lumbar back: Normal.     Right lower leg: No edema.     Left lower leg: No edema.  Lymphadenopathy:     Head:     Right side of head: No  submental, submandibular, preauricular, posterior auricular or occipital adenopathy.     Left side of head: No submental, submandibular, preauricular, posterior auricular or occipital adenopathy.     Cervical: No cervical adenopathy.     Upper Body:     Right upper body: No supraclavicular, axillary, pectoral or epitrochlear adenopathy.     Left upper body: No supraclavicular, axillary, pectoral or epitrochlear adenopathy.     Lower Body: No right inguinal adenopathy. No left inguinal adenopathy.  Skin:    General: Skin is warm and dry.     Capillary Refill:  Capillary refill takes less than 2 seconds.     Findings: No bruising or rash.     Comments: No warmth no erythema no fluctuance no signs of infection over the clavicle or the hip.    Neurological:     General: No focal deficit present.     Mental Status: He is alert and oriented to person, place, and time.     Deep Tendon Reflexes: Reflexes normal.  Psychiatric:        Mood and Affect: Mood normal.        Behavior: Behavior normal.      ED Treatments / Results  Labs (all labs ordered are listed, but only abnormal results are displayed) Labs Reviewed - No data to display  EKG None  Radiology Results for orders placed or performed during the hospital encounter of 10/26/17  Comprehensive metabolic panel  Result Value Ref Range   Sodium 145 135 - 145 mmol/L   Potassium 3.4 (L) 3.5 - 5.1 mmol/L   Chloride 107 98 - 111 mmol/L   CO2 31 22 - 32 mmol/L   Glucose, Bld 99 70 - 99 mg/dL   BUN 8 6 - 20 mg/dL   Creatinine, Ser 6.960.92 0.61 - 1.24 mg/dL   Calcium 8.6 (L) 8.9 - 10.3 mg/dL   Total Protein 6.9 6.5 - 8.1 g/dL   Albumin 3.4 (L) 3.5 - 5.0 g/dL   AST 295168 (H) 15 - 41 U/L   ALT 199 (H) 0 - 44 U/L   Alkaline Phosphatase 62 38 - 126 U/L   Total Bilirubin 0.4 0.3 - 1.2 mg/dL   GFR calc non Af Amer >60 >60 mL/min   GFR calc Af Amer >60 >60 mL/min   Anion gap 7 5 - 15  Ethanol  Result Value Ref Range   Alcohol, Ethyl (B)  62 (H) <10 mg/dL  Urine rapid drug screen (hosp performed)  Result Value Ref Range   Opiates POSITIVE (A) NONE DETECTED   Cocaine POSITIVE (A) NONE DETECTED   Benzodiazepines NONE DETECTED NONE DETECTED   Amphetamines POSITIVE (A) NONE DETECTED   Tetrahydrocannabinol NONE DETECTED NONE DETECTED   Barbiturates (A) NONE DETECTED    Result not available. Reagent lot number recalled by manufacturer.  CBC with Diff  Result Value Ref Range   WBC 8.0 4.0 - 10.5 K/uL   RBC 4.11 (L) 4.22 - 5.81 MIL/uL   Hemoglobin 12.3 (L) 13.0 - 17.0 g/dL   HCT 28.436.5 (L) 13.239.0 - 44.052.0 %   MCV 88.8 78.0 - 100.0 fL   MCH 29.9 26.0 - 34.0 pg   MCHC 33.7 30.0 - 36.0 g/dL   RDW 10.213.7 72.511.5 - 36.615.5 %   Platelets 288 150 - 400 K/uL   Neutrophils Relative % 45 %   Neutro Abs 3.6 1.7 - 7.7 K/uL   Lymphocytes Relative 41 %   Lymphs Abs 3.3 0.7 - 4.0 K/uL   Monocytes Relative 6 %   Monocytes Absolute 0.5 0.1 - 1.0 K/uL   Eosinophils Relative 7 %   Eosinophils Absolute 0.6 0.0 - 0.7 K/uL   Basophils Relative 1 %   Basophils Absolute 0.1 0.0 - 0.1 K/uL  Salicylate level  Result Value Ref Range   Salicylate Lvl <7.0 2.8 - 30.0 mg/dL  Acetaminophen level  Result Value Ref Range   Acetaminophen (Tylenol), Serum <10 (L) 10 - 30 ug/mL   Dg Chest Portable 1 View  Result Date: 01/05/2019 CLINICAL DATA:  33 year old male with chest pain.  EXAM: PORTABLE CHEST 1 VIEW COMPARISON:  Chest radiograph dated 05/08/2017 FINDINGS: The heart size and mediastinal contours are within normal limits. Both lungs are clear. The visualized skeletal structures are unremarkable. IMPRESSION: No active disease. Electronically Signed   By: Elgie Collard M.D.   On: 01/05/2019 02:39   Dg Hip Unilat With Pelvis Min 4 Views Left  Result Date: 01/05/2019 CLINICAL DATA:  Left hip pain. EXAM: DG HIP (WITH OR WITHOUT PELVIS) 4+V LEFT COMPARISON:  None. FINDINGS: The cortical margins of the bony pelvis and left hip are intact. No fracture. Pubic  symphysis and sacroiliac joints are congruent. Sclerotic density in the intertrochanteric left femur is most consistent with bone island. Os acetabulum. No bony destructive change or periosteal reaction. Both femoral heads are well-seated in the respective acetabula. Mild soft tissue edema laterally. IMPRESSION: Soft tissue edema about the lateral hip. No acute osseous abnormalities. Incidental bone island in the proximal femur. Electronically Signed   By: Narda Rutherford M.D.   On: 01/05/2019 03:06    Procedures Procedures (including critical care time)  Medications Ordered in ED Medications  ketorolac (TORADOL) injection 30 mg (30 mg Intramuscular Refused 01/05/19 0203)    I do not believe the patient is septic. There are no signs of joint effusions clinically.   There are no signs of infection on exam.  There are no track marks.  There are no signs of cellulitis.  The Xrays are negative for gas in the soft tissues, negative for fractures and negative for osteomyelitis.  I will prescribe lidoderm and naproxen.  I have advised close follow up with PMD.  Return for fevers, weakness, swelling.    Trevaughn Schear was evaluated in Emergency Department on 01/05/2019 for the symptoms described in the history of present illness. He was evaluated in the context of the global COVID-19 pandemic, which necessitated consideration that the patient might be at risk for infection with the SARS-CoV-2 virus that causes COVID-19. Institutional protocols and algorithms that pertain to the evaluation of patients at risk for COVID-19 are in a state of rapid change based on information released by regulatory bodies including the CDC and federal and state organizations. These policies and algorithms were followed during the patient's care in the ED.  Final Clinical Impressions(s) / ED Diagnoses   Return for intractable cough, coughing up blood,fevers >100.4 unrelieved by medication, shortness of breath,  intractable vomiting, chest pain, shortness of breath, weakness,numbness, changes in speech, facial asymmetry,abdominal pain, passing out,Inability to tolerate liquids or food, cough, altered mental status or any concerns. No signs of systemic illness or infection. The patient is nontoxic-appearing on exam and vital signs are within normal limits.   I have reviewed the triage vital signs and the nursing notes. Pertinent labs &imaging results that were available during my care of the patient were reviewed by me and considered in my medical decision making (see chart for details).After history, exam, and medical workup I feel the patient has beenappropriately medically screened and is safe for discharge home. Pertinent diagnoses were discussed with the patient. Patient was given return precautions.       Lauree Yurick, MD 01/05/19 4098

## 2019-01-05 NOTE — ED Notes (Signed)
Pt refused hip xray. MD aware.

## 2019-01-05 NOTE — ED Triage Notes (Signed)
Pt states he has a knot on his chest x 1 week that is painful.

## 2019-01-05 NOTE — ED Notes (Signed)
Pt pain and tenderness to palpation over left clavicle with no obvious swelling noted. Pt also has pain and tenderness to left hip with no obvious swelling. Lymph nodes are unremarkable.

## 2019-01-07 ENCOUNTER — Other Ambulatory Visit: Payer: Self-pay

## 2019-01-07 DIAGNOSIS — Z20828 Contact with and (suspected) exposure to other viral communicable diseases: Secondary | ICD-10-CM | POA: Diagnosis present

## 2019-01-07 DIAGNOSIS — F1010 Alcohol abuse, uncomplicated: Secondary | ICD-10-CM | POA: Diagnosis present

## 2019-01-07 DIAGNOSIS — M130 Polyarthritis, unspecified: Principal | ICD-10-CM | POA: Diagnosis present

## 2019-01-07 DIAGNOSIS — F1410 Cocaine abuse, uncomplicated: Secondary | ICD-10-CM | POA: Diagnosis present

## 2019-01-07 DIAGNOSIS — M7120 Synovial cyst of popliteal space [Baker], unspecified knee: Secondary | ICD-10-CM | POA: Diagnosis present

## 2019-01-07 DIAGNOSIS — Z87891 Personal history of nicotine dependence: Secondary | ICD-10-CM

## 2019-01-07 DIAGNOSIS — F1510 Other stimulant abuse, uncomplicated: Secondary | ICD-10-CM | POA: Diagnosis present

## 2019-01-07 DIAGNOSIS — R011 Cardiac murmur, unspecified: Secondary | ICD-10-CM | POA: Diagnosis present

## 2019-01-07 DIAGNOSIS — M109 Gout, unspecified: Secondary | ICD-10-CM | POA: Diagnosis present

## 2019-01-07 DIAGNOSIS — F1110 Opioid abuse, uncomplicated: Secondary | ICD-10-CM | POA: Diagnosis present

## 2019-01-07 MED ORDER — IBUPROFEN 200 MG PO TABS
400.0000 mg | ORAL_TABLET | Freq: Once | ORAL | Status: AC | PRN
  Administered 2019-01-07: 400 mg via ORAL
  Filled 2019-01-07: qty 2

## 2019-01-07 NOTE — ED Triage Notes (Signed)
Pt reports pain in the left hip that started about 1.5 weeks ago which subsided about 3 hours ago when he started having pain in the left knee. Denies injury to the same.  Pt says he "has felt hot" since this morning. No cough, SOB, or sick contacts.

## 2019-01-07 NOTE — ED Triage Notes (Signed)
Pt started having hip pain three days ago that radiated to the knee about 2 hours ago. Now hip pain is relieved. Denies injury. Pt said he felt hot when he was picked up, temp en route was 102. 152/90, hr 130, 97% RA,.

## 2019-01-08 ENCOUNTER — Inpatient Hospital Stay (HOSPITAL_COMMUNITY): Payer: Self-pay

## 2019-01-08 ENCOUNTER — Inpatient Hospital Stay (HOSPITAL_COMMUNITY)
Admission: EM | Admit: 2019-01-08 | Discharge: 2019-01-12 | DRG: 554 | Disposition: A | Discharge status: Home / Self Care | Payer: Self-pay | Attending: Internal Medicine | Admitting: Internal Medicine

## 2019-01-08 ENCOUNTER — Encounter (HOSPITAL_COMMUNITY): Payer: Self-pay | Admitting: *Deleted

## 2019-01-08 ENCOUNTER — Emergency Department (HOSPITAL_COMMUNITY): Payer: Self-pay

## 2019-01-08 DIAGNOSIS — F1410 Cocaine abuse, uncomplicated: Secondary | ICD-10-CM

## 2019-01-08 DIAGNOSIS — M25561 Pain in right knee: Secondary | ICD-10-CM

## 2019-01-08 DIAGNOSIS — A419 Sepsis, unspecified organism: Secondary | ICD-10-CM

## 2019-01-08 DIAGNOSIS — I38 Endocarditis, valve unspecified: Secondary | ICD-10-CM

## 2019-01-08 DIAGNOSIS — F1110 Opioid abuse, uncomplicated: Secondary | ICD-10-CM

## 2019-01-08 DIAGNOSIS — F1010 Alcohol abuse, uncomplicated: Secondary | ICD-10-CM

## 2019-01-08 DIAGNOSIS — F1510 Other stimulant abuse, uncomplicated: Secondary | ICD-10-CM | POA: Diagnosis present

## 2019-01-08 DIAGNOSIS — M1380 Other specified arthritis, unspecified site: Secondary | ICD-10-CM

## 2019-01-08 DIAGNOSIS — R011 Cardiac murmur, unspecified: Secondary | ICD-10-CM

## 2019-01-08 DIAGNOSIS — R6520 Severe sepsis without septic shock: Secondary | ICD-10-CM | POA: Diagnosis present

## 2019-01-08 DIAGNOSIS — R6510 Systemic inflammatory response syndrome (SIRS) of non-infectious origin without acute organ dysfunction: Secondary | ICD-10-CM

## 2019-01-08 DIAGNOSIS — F1990 Other psychoactive substance use, unspecified, uncomplicated: Secondary | ICD-10-CM

## 2019-01-08 LAB — BASIC METABOLIC PANEL
Anion gap: 11 (ref 5–15)
BUN: 11 mg/dL (ref 6–20)
CO2: 22 mmol/L (ref 22–32)
Calcium: 9 mg/dL (ref 8.9–10.3)
Chloride: 103 mmol/L (ref 98–111)
Creatinine, Ser: 0.78 mg/dL (ref 0.61–1.24)
GFR calc Af Amer: >60 mL/min (ref >60)
GFR calc non Af Amer: >60 mL/min (ref >60)
Glucose, Bld: 100 mg/dL — ABNORMAL HIGH (ref 70–99)
Potassium: 4 mmol/L (ref 3.5–5.1)
Sodium: 136 mmol/L (ref 135–145)

## 2019-01-08 LAB — CBC WITH DIFFERENTIAL/PLATELET
Abs Immature Granulocytes: 0.06 10*3/uL (ref 0.00–0.07)
Basophils Absolute: 0 10*3/uL (ref 0.0–0.1)
Basophils Relative: 0 %
Eosinophils Absolute: 0.3 10*3/uL (ref 0.0–0.5)
Eosinophils Relative: 2 %
HCT: 34.5 % — ABNORMAL LOW (ref 39.0–52.0)
Hemoglobin: 11.1 g/dL — ABNORMAL LOW (ref 13.0–17.0)
Immature Granulocytes: 0 %
Lymphocytes Relative: 17 %
Lymphs Abs: 2.6 10*3/uL (ref 0.7–4.0)
MCH: 28 pg (ref 26.0–34.0)
MCHC: 32.2 g/dL (ref 30.0–36.0)
MCV: 86.9 fL (ref 80.0–100.0)
Monocytes Absolute: 1.1 10*3/uL — ABNORMAL HIGH (ref 0.1–1.0)
Monocytes Relative: 7 %
Neutro Abs: 11.4 10*3/uL — ABNORMAL HIGH (ref 1.7–7.7)
Neutrophils Relative %: 74 %
Platelets: 352 10*3/uL (ref 150–400)
RBC: 3.97 MIL/uL — ABNORMAL LOW (ref 4.22–5.81)
RDW: 13.5 % (ref 11.5–15.5)
WBC: 15.3 10*3/uL — ABNORMAL HIGH (ref 4.0–10.5)
nRBC: 0 % (ref 0.0–0.2)

## 2019-01-08 LAB — CBC
HCT: 32.5 % — ABNORMAL LOW (ref 39.0–52.0)
Hemoglobin: 10.6 g/dL — ABNORMAL LOW (ref 13.0–17.0)
MCH: 28.6 pg (ref 26.0–34.0)
MCHC: 32.6 g/dL (ref 30.0–36.0)
MCV: 87.6 fL (ref 80.0–100.0)
Platelets: 318 10*3/uL (ref 150–400)
RBC: 3.71 MIL/uL — ABNORMAL LOW (ref 4.22–5.81)
RDW: 13.7 % (ref 11.5–15.5)
WBC: 11.9 10*3/uL — ABNORMAL HIGH (ref 4.0–10.5)
nRBC: 0 % (ref 0.0–0.2)

## 2019-01-08 LAB — COMPREHENSIVE METABOLIC PANEL
ALT: 28 U/L (ref 0–44)
AST: 28 U/L (ref 15–41)
Albumin: 2.9 g/dL — ABNORMAL LOW (ref 3.5–5.0)
Alkaline Phosphatase: 49 U/L (ref 38–126)
Anion gap: 7 (ref 5–15)
BUN: 10 mg/dL (ref 6–20)
CO2: 24 mmol/L (ref 22–32)
Calcium: 8.5 mg/dL — ABNORMAL LOW (ref 8.9–10.3)
Chloride: 107 mmol/L (ref 98–111)
Creatinine, Ser: 0.74 mg/dL (ref 0.61–1.24)
GFR calc Af Amer: >60 mL/min (ref >60)
GFR calc non Af Amer: >60 mL/min (ref >60)
Glucose, Bld: 99 mg/dL (ref 70–99)
Potassium: 3.7 mmol/L (ref 3.5–5.1)
Sodium: 138 mmol/L (ref 135–145)
Total Bilirubin: 0.4 mg/dL (ref 0.3–1.2)
Total Protein: 6.4 g/dL — ABNORMAL LOW (ref 6.5–8.1)

## 2019-01-08 LAB — ECHOCARDIOGRAM COMPLETE

## 2019-01-08 LAB — SARS CORONAVIRUS 2 (TAT 6-24 HRS): SARS Coronavirus 2: NEGATIVE

## 2019-01-08 LAB — LACTIC ACID, PLASMA: Lactic Acid, Venous: 0.6 mmol/L (ref 0.5–1.9)

## 2019-01-08 LAB — SEDIMENTATION RATE: Sed Rate: 63 mm/hr — ABNORMAL HIGH (ref 0–16)

## 2019-01-08 MED ORDER — VANCOMYCIN HCL 10 G IV SOLR
1750.0000 mg | Freq: Once | INTRAVENOUS | Status: AC
  Administered 2019-01-08: 1750 mg via INTRAVENOUS
  Filled 2019-01-08: qty 1750

## 2019-01-08 MED ORDER — BUPRENORPHINE HCL-NALOXONE HCL 2-0.5 MG SL SUBL
1.0000 | SUBLINGUAL_TABLET | SUBLINGUAL | Status: AC | PRN
  Administered 2019-01-08 – 2019-01-09 (×2): 1 via SUBLINGUAL
  Filled 2019-01-08 (×2): qty 1

## 2019-01-08 MED ORDER — ONDANSETRON HCL 4 MG PO TABS: 4.0000 mg | ORAL_TABLET | Freq: Four times a day (QID) | ORAL | Status: DC | PRN

## 2019-01-08 MED ORDER — BUPRENORPHINE HCL-NALOXONE HCL 8-2 MG SL SUBL: 1.0000 | SUBLINGUAL_TABLET | Freq: Every day | SUBLINGUAL | Status: DC

## 2019-01-08 MED ORDER — CLONIDINE HCL 0.1 MG PO TABS: 0.1000 mg | ORAL_TABLET | Freq: Every day | ORAL | Status: DC

## 2019-01-08 MED ORDER — BUPRENORPHINE HCL-NALOXONE HCL 8-2 MG SL SUBL
1.0000 | SUBLINGUAL_TABLET | Freq: Two times a day (BID) | SUBLINGUAL | Status: DC
  Administered 2019-01-09 – 2019-01-12 (×6): 1 via SUBLINGUAL
  Filled 2019-01-08 (×6): qty 1

## 2019-01-08 MED ORDER — METHOCARBAMOL 500 MG PO TABS
500.0000 mg | ORAL_TABLET | Freq: Three times a day (TID) | ORAL | Status: DC | PRN
  Administered 2019-01-10 – 2019-01-12 (×4): 500 mg via ORAL
  Filled 2019-01-08 (×4): qty 1

## 2019-01-08 MED ORDER — SODIUM CHLORIDE 0.9 % IV SOLN
2.0000 g | INTRAVENOUS | Status: DC
  Administered 2019-01-08 – 2019-01-09 (×7): 2 g via INTRAVENOUS
  Filled 2019-01-08: qty 2
  Filled 2019-01-08: qty 2000
  Filled 2019-01-08: qty 2
  Filled 2019-01-08 (×5): qty 2000
  Filled 2019-01-08 (×3): qty 2
  Filled 2019-01-08: qty 2000

## 2019-01-08 MED ORDER — GENTAMICIN IN SALINE 1.6-0.9 MG/ML-% IV SOLN
80.0000 mg | Freq: Three times a day (TID) | INTRAVENOUS | Status: DC
  Administered 2019-01-08 – 2019-01-09 (×4): 80 mg via INTRAVENOUS
  Filled 2019-01-08 (×6): qty 50

## 2019-01-08 MED ORDER — HYDROXYZINE HCL 25 MG PO TABS
25.0000 mg | ORAL_TABLET | Freq: Four times a day (QID) | ORAL | Status: DC | PRN
  Administered 2019-01-08 – 2019-01-11 (×9): 25 mg via ORAL
  Filled 2019-01-08 (×9): qty 1

## 2019-01-08 MED ORDER — SODIUM CHLORIDE 0.9 % IV SOLN
INTRAVENOUS | Status: DC
  Administered 2019-01-08 – 2019-01-09 (×2): via INTRAVENOUS

## 2019-01-08 MED ORDER — KETOROLAC TROMETHAMINE 30 MG/ML IJ SOLN
15.0000 mg | Freq: Once | INTRAMUSCULAR | Status: AC
  Administered 2019-01-08: 15 mg via INTRAVENOUS
  Filled 2019-01-08: qty 1

## 2019-01-08 MED ORDER — DICYCLOMINE HCL 20 MG PO TABS: 20.0000 mg | ORAL_TABLET | Freq: Four times a day (QID) | ORAL | Status: DC | PRN

## 2019-01-08 MED ORDER — KETOROLAC TROMETHAMINE 30 MG/ML IJ SOLN: 30.0000 mg | Freq: Once | INTRAMUSCULAR | Status: DC

## 2019-01-08 MED ORDER — VANCOMYCIN HCL 10 G IV SOLR
1250.0000 mg | Freq: Three times a day (TID) | INTRAVENOUS | Status: DC
  Administered 2019-01-08 – 2019-01-11 (×9): 1250 mg via INTRAVENOUS
  Filled 2019-01-08 (×10): qty 1250

## 2019-01-08 MED ORDER — ONDANSETRON HCL 4 MG/2ML IJ SOLN: 4.0000 mg | Freq: Four times a day (QID) | INTRAMUSCULAR | Status: DC | PRN

## 2019-01-08 MED ORDER — LOPERAMIDE HCL 2 MG PO CAPS: 2.0000 mg | ORAL_CAPSULE | ORAL | Status: DC | PRN

## 2019-01-08 MED ORDER — VANCOMYCIN HCL 10 G IV SOLR: 2000.0000 mg | Freq: Once | INTRAVENOUS | Status: DC

## 2019-01-08 MED ORDER — ACETAMINOPHEN 325 MG PO TABS
650.0000 mg | ORAL_TABLET | Freq: Four times a day (QID) | ORAL | Status: DC | PRN
  Administered 2019-01-08: 10:00:00 650 mg via ORAL
  Filled 2019-01-08: qty 2

## 2019-01-08 MED ORDER — ACETAMINOPHEN 650 MG RE SUPP: 650.0000 mg | Freq: Four times a day (QID) | RECTAL | Status: DC | PRN

## 2019-01-08 MED ORDER — SODIUM CHLORIDE 0.9 % IV BOLUS
1000.0000 mL | Freq: Once | INTRAVENOUS | Status: AC
  Administered 2019-01-08: 05:00:00 1000 mL via INTRAVENOUS

## 2019-01-08 MED ORDER — CLONIDINE HCL 0.1 MG PO TABS
0.1000 mg | ORAL_TABLET | ORAL | Status: AC
  Administered 2019-01-10 – 2019-01-12 (×4): 0.1 mg via ORAL
  Filled 2019-01-08 (×4): qty 1

## 2019-01-08 MED ORDER — KETOROLAC TROMETHAMINE 30 MG/ML IJ SOLN
15.0000 mg | Freq: Four times a day (QID) | INTRAMUSCULAR | Status: DC | PRN
  Administered 2019-01-08 – 2019-01-12 (×6): 15 mg via INTRAVENOUS
  Filled 2019-01-08 (×6): qty 1

## 2019-01-08 MED ORDER — CLONIDINE HCL 0.1 MG PO TABS
0.1000 mg | ORAL_TABLET | Freq: Four times a day (QID) | ORAL | Status: AC
  Administered 2019-01-08 – 2019-01-10 (×8): 0.1 mg via ORAL
  Filled 2019-01-08 (×8): qty 1

## 2019-01-08 NOTE — Progress Notes (Signed)
  Echocardiogram 2D Echocardiogram has been performed.  [facility] 01/08/2019, 9:32 AM

## 2019-01-08 NOTE — Progress Notes (Signed)
Attempted to call and get report with no answer.

## 2019-01-08 NOTE — ED Notes (Signed)
Pt transported to XR.  

## 2019-01-08 NOTE — H&P (Addendum)
History and Physical   Grant Tapia UMP:[id number] DOB: 1986/03/12 DOA: 01/08/2019  Referring MD/NP/PA: Genelle Bal  PCP: Patient, No Pcp Per   Outpatient Specialists: None  Patient coming from: Home  Chief Complaint: Right knee pain  HPI: Grant Tapia is a 33 y.o. male with medical history significant of IV drug abuse especially heroin and hypertension who came in with right knee pain that happened within 2 to 3 hours before coming to the ER.  There is also swelling of the knee.  Pain is rated as 7 out of 10 worse with movement or putting weight on the knee.  No history of trauma.  No fever or chills.  Patient was sent only to days earlier with pain in the left medial clavicle and left hip.  That has subsided.  At that time temperature was 102 and was having sinus tachycardia.  Patient was treated and came back today again with fever and tachycardia.  He has some mild murmur on exam apparently so patient is being admitted with suspicion for acute bacterial endocarditis due to his IV drug abuse.  Denied any dysuria.  Denied any nausea or vomiting..  ED Course: Temperature is 100.1 blood pressure 149/101 pulse 123 respiratory rate of 17 oxygen sats 95% room air.  White count 15.3 hemoglobin 11.1 and platelets of 352.  Chemistry appeared to be within normal.  COVID-19 screen is pending.  Blood cultures were obtained and patient is being admitted with suspected endocarditis and sepsis.  Review of Systems: As per HPI otherwise 10 point review of systems negative.    Past Medical History:  Diagnosis Date  . Heroin abuse ([place])   . Hypertension   . IV drug user   . Overdose   . Polysubstance abuse [facility])     Past Surgical History:  Procedure Laterality Date  . I&D EXTREMITY Left 11/19/2016   Procedure: INCISION AND DRAINAGE LEFT ELBOW  ABSCESS;  Surgeon: Altamese Millry, MD;  Location: [place];  Service: Orthopedics;  Laterality: Left;     reports that he quit smoking about  12 months ago. He has never used smokeless tobacco. He reports current alcohol use. He reports current drug use. Drugs: IV and Cocaine.  Allergies  Allergen Reactions  . Bee Venom Swelling    No family history on file.   Prior to Admission medications   Medication Sig Start Date End Date Taking? Authorizing Provider  acetaminophen (TYLENOL) 500 MG tablet Take 1,000 mg by mouth every 6 (six) hours as needed for mild pain.   Yes [provider]  ibuprofen (ADVIL) 200 MG tablet Take 600-800 mg by mouth every 6 (six) hours as needed for moderate pain.   Yes [provider]  diclofenac sodium (VOLTAREN) 1 % GEL Apply 4 g topically 4 (four) times daily. Patient not taking: Reported on 01/08/2019 01/05/19   Palumbo, April, MD  lidocaine (LIDODERM) 5 % Place 1 patch onto the skin daily. Remove & Discard patch within 12 hours or as directed by MD Patient not taking: Reported on 01/08/2019 01/05/19   Randal Buba, April, MD    Physical Exam: Vitals:   01/07/19 2343 01/08/19 0226 01/08/19 0507  BP: (!) 149/101 (!) 145/102 (!) 144/96  Pulse: (!) 123 (!) 107 (!) 103  Resp: 16 16 17   Temp: 100.1 F (37.8 C) 99.8 F (37.7 C)   TempSrc: Oral Oral   SpO2: 95% 98% 100%  Weight: 83.9 kg    Height: 5\' 10"  (1.778 m)  Constitutional: Very anxious Vitals:   01/07/19 2343 01/08/19 0226 01/08/19 0507  BP: (!) 149/101 (!) 145/102 (!) 144/96  Pulse: (!) 123 (!) 107 (!) 103  Resp: 16 16 17   Temp: 100.1 F (37.8 C) 99.8 F (37.7 C)   TempSrc: Oral Oral   SpO2: 95% 98% 100%  Weight: 83.9 kg    Height: 5\' 10"  (1.778 m)     Eyes: PERRL, lids and conjunctivae normal ENMT: Mucous membranes are moist. Posterior pharynx clear of any exudate or lesions.Normal dentition.  Neck: normal, supple, no masses, no thyromegaly Respiratory: clear to auscultation bilaterally, no wheezing, no crackles. Normal respiratory effort. No accessory muscle use.  Cardiovascular: Sinus tachycardia grade 2  out of 5 systolic murmur/ rubs / gallops. No extremity edema. 2+ pedal pulses. No carotid bruits.  Abdomen: no tenderness, no masses palpated. No hepatosplenomegaly. Bowel sounds positive.  Musculoskeletal: no clubbing / cyanosis.  Right knee is warm, mildly swollen, no erythema, slightly decreased range of motion good ROM, no contractures. Normal muscle tone.  Skin: no rashes, lesions, ulcers. No induration Neurologic: CN 2-12 grossly intact. Sensation intact, DTR normal. Strength 5/5 in all 4.  Psychiatric: Normal judgment and insight. Alert and oriented x 3. Normal mood.     Labs on Admission: I have personally reviewed following labs and imaging studies  CBC: Recent Labs  Lab 01/08/19 0248  WBC 15.3*  NEUTROABS 11.4*  HGB 11.1*  HCT 34.5*  MCV 86.9  PLT 352   Basic Metabolic Panel: Recent Labs  Lab 01/08/19 0248  NA 136  K 4.0  CL 103  CO2 22  GLUCOSE 100*  BUN 11  CREATININE 0.78  CALCIUM 9.0   GFR: Estimated Creatinine Clearance: 139.4 mL/min (by C-G formula based on SCr of 0.78 mg/dL). Liver Function Tests: No results for input(s): AST, ALT, ALKPHOS, BILITOT, PROT, ALBUMIN in the last 168 hours. No results for input(s): LIPASE, AMYLASE in the last 168 hours. No results for input(s): AMMONIA in the last 168 hours. Coagulation Profile: No results for input(s): INR, PROTIME in the last 168 hours. Cardiac Enzymes: No results for input(s): CKTOTAL, CKMB, CKMBINDEX, TROPONINI in the last 168 hours. BNP (last 3 results) No results for input(s): PROBNP in the last 8760 hours. HbA1C: No results for input(s): HGBA1C in the last 72 hours. CBG: No results for input(s): GLUCAP in the last 168 hours. Lipid Profile: No results for input(s): CHOL, HDL, LDLCALC, TRIG, CHOLHDL, LDLDIRECT in the last 72 hours. Thyroid Function Tests: No results for input(s): TSH, T4TOTAL, FREET4, T3FREE, THYROIDAB in the last 72 hours. Anemia Panel: No results for input(s): VITAMINB12,  FOLATE, FERRITIN, TIBC, IRON, RETICCTPCT in the last 72 hours. Urine analysis:    Component Value Date/Time   COLORURINE YELLOW 12/30/2016 0215   APPEARANCEUR CLEAR 12/30/2016 0215   LABSPEC 1.021 12/30/2016 0215   PHURINE 5.0 12/30/2016 0215   GLUCOSEU NEGATIVE 12/30/2016 0215   HGBUR NEGATIVE 12/30/2016 0215   BILIRUBINUR NEGATIVE 12/30/2016 0215   KETONESUR NEGATIVE 12/30/2016 0215   PROTEINUR NEGATIVE 12/30/2016 0215   UROBILINOGEN 0.2 03/26/2014 2255   NITRITE NEGATIVE 12/30/2016 0215   LEUKOCYTESUR NEGATIVE 12/30/2016 0215   Sepsis Labs: @LABRCNTIP (procalcitonin:4,lacticidven:4) )No results found for this or any previous visit (from the past 240 hour(s)).   Radiological Exams on Admission: Dg Knee Complete 4 Views Right  Result Date: 01/08/2019 CLINICAL DATA:  Initial evaluation for acute knee pain, no injury. EXAM: RIGHT KNEE - COMPLETE 4+ VIEW COMPARISON:  None. FINDINGS: No evidence  of fracture, dislocation, or joint effusion. No evidence of arthropathy or other focal bone abnormality. Soft tissues are unremarkable. IMPRESSION: Negative. Electronically Signed   By: Rise Mu M.D.   On: 01/08/2019 03:33     Assessment/Plan Principal Problem:   Sepsis (HCC) Active Problems:   Alcohol abuse   Heroin abuse (HCC)   Methamphetamine abuse (HCC)   Cocaine abuse (HCC)     #1 early sepsis: Has evidence of SIRS with suspected infection.  Patient is an IV drug abuser no obvious source of infection otherwise.  Chest x-ray pending urine UA also will be ordered.  Knee x-rays showed no evidence of any significant finding.  He may be having early septic arthritis.  At this point will admit the patient initiate IV vancomycin gentamicin and ampicillin for possible endocarditis which should also treat possible septic arthritis if present.  Counseling provided and await culture results.  Echocardiogram will be done.  #2 suspected endocarditis: We will proceed as above.  TEE  will be done if cultures come back positive despite the TTE.  #3 possible right knee infection: Again supportive care and await culture results.  If knee continues to get swollen patient may need Ortho consult with aspiration.  #4 polysubstance abuse: Patient uses multiple substances including alcohol heroin and meth and cocaine.  Drug screen will be ordered.  Wait for any signs of withdrawals.  May have to initiate CIWA protocol if visible signs of withdrawal show   DVT prophylaxis: Heparin Code Status: Full code Family Communication: No family at bedside Disposition Plan: Home Consults called: None Admission status: Inpatient  Severity of Illness: The appropriate patient status for this patient is INPATIENT. Inpatient status is judged to be reasonable and necessary in order to provide the required intensity of service to ensure the patient's safety. The patient's presenting symptoms, physical exam findings, and initial radiographic and laboratory data in the context of their chronic comorbidities is felt to place them at high risk for further clinical deterioration. Furthermore, it is not anticipated that the patient will be medically stable for discharge from the hospital within 2 midnights of admission. The following factors support the patient status of inpatient.   " The patient's presenting symptoms include right knee pain and fever. " The worrisome physical exam findings include warm right knee. " The initial radiographic and laboratory data are worrisome because of evidence of SIRS. " The chronic co-morbidities include IV drug abuse.   * I certify that at the point of admission it is my clinical judgment that the patient will require inpatient hospital care spanning beyond 2 midnights from the point of admission due to high intensity of service, high risk for further deterioration and high frequency of surveillance required.Lonia Blood MD [facility] Pager [phone]  If 7PM-7AM, please contact night-coverage [email or web address] Password [facility]  01/08/2019, 5:46 AM

## 2019-01-08 NOTE — ED Provider Notes (Signed)
[facility]-EMERGENCY DEPT Provider Note   CSN: [id number] Arrival date & time: 01/07/19  2331     History   Chief Complaint Chief Complaint  Patient presents with  . Knee Pain    HPI Grant Tapia is a 33 y.o. male.     33 y/o male with hx of HTN presents to the emergency department for evaluation of right knee pain.  Right knee pain began approximately 2 hours prior to arrival and has been associated with swelling of the knee.  Pain is aggravated with weightbearing.  He denies any history of trauma or injury to the extremity.  Was seen 2 days ago for pain to his medial left clavicle as well as his left hip.  States that these pains subsided shortly before onset of his knee pain tonight.  Has not had any associated numbness or paresthesias in the extremity, but did start to feel "feverish" after his pain began.  Was reportedly febrile with EMS to 102F, tachy to 130's.  Has not taken any medications prior to arrival for pain.  Denies concern for STDs including syphilis, gonorrhea, chlamydia. 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 Denies sharing needles or reusing needles.   Knee Pain   Past Medical History:  Diagnosis Date  . Heroin abuse (HCC)   . Hypertension   . IV drug user   . Overdose   . Polysubstance abuse [facility](HCC)     Patient Active Problem List   Diagnosis Date Noted  . Poisoning by heroin, accidental (unintentional), initial encounter (HCC) 05/08/2017  . Heroin dependence (HCC) 05/08/2017  . Back pain 12/30/2016  . Abnormal LFTs 12/30/2016  . Polysubstance abuse (HCC)   . Cellulitis 11/19/2016  . IV drug user 11/19/2016  . Abscess 11/19/2016  . Delirium   . Tachycardia   . Overdose 07/18/2016  . Drug withdrawal (HCC) 07/16/2016  . AKI (acute kidney injury) (HCC) 07/15/2016  . Alcohol abuse 07/15/2016  . Heroin abuse (HCC) 07/15/2016  . 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 Cocaine abuse (HCC) 07/15/2016  . Acute metabolic  encephalopathy 07/15/2016  . Lactic acidosis 03/29/2014  . Sepsis (HCC) 03/27/2014  . Chest pain   . Headache   . Neck pain     Past Surgical History:  Procedure Laterality Date  . I&D EXTREMITY Left 11/19/2016   Procedure: INCISION AND DRAINAGE LEFT ELBOW  ABSCESS;  Surgeon: Myrene GalasHandy, Michael, MD;  Location: [facility] OR;  Service: Orthopedics;  Laterality: Left;        Home Medications    Prior to Admission medications   Medication Sig Start Date End Date Taking? Authorizing Provider  acetaminophen (TYLENOL) 500 MG tablet Take 1,000 mg by mouth every 6 (six) hours as needed for mild pain.   Yes [provider]  ibuprofen (ADVIL) 200 MG tablet Take 600-800 mg by mouth every 6 (six) hours as needed for moderate pain.   Yes [provider]  diclofenac sodium (VOLTAREN) 1 % GEL Apply 4 g topically 4 (four) times daily. Patient not taking: Reported on 01/08/2019 01/05/19   Palumbo, April, MD  lidocaine (LIDODERM) 5 % Place 1 patch onto the skin daily. Remove & Discard patch within 12 hours or as directed by MD Patient not taking: Reported on 01/08/2019 01/05/19   Nicanor AlconPalumbo, April, MD    Family History No family history on file.  Social History Social History   Tobacco Use  . Smoking status: Former Smoker    Quit date: 12/12/2017  Years since quitting: 1.0  . Smokeless tobacco: Never Used  Substance Use Topics  . Alcohol use: Yes    Comment: occasional wine  . Drug use: Yes    Types: IV, Cocaine    Comment: heroin     Allergies   Bee venom   Review of Systems Review of Systems Ten systems reviewed and are negative for acute change, except as noted in the HPI.    Physical Exam Updated Vital Signs BP (!) 144/96   Pulse (!) 103   Temp 99.8 F (37.7 C) (Oral)   Resp 17   Ht 5\' 10"  (1.778 m)   Wt 83.9 kg   SpO2 100%   BMI 26.54 kg/m   Physical Exam Vitals signs and nursing note reviewed.  Constitutional:      General: He is not in acute distress.     Appearance: He is well-developed. He is not diaphoretic.     Comments: Nontoxic appearing and in NAD  HENT:     Head: Normocephalic and atraumatic.  Eyes:     General: No scleral icterus.    Conjunctiva/sclera: Conjunctivae normal.  Neck:     Musculoskeletal: Normal range of motion.  Cardiovascular:     Rate and Rhythm: Regular rhythm. Tachycardia present.     Pulses: Normal pulses.     Comments: Faint I/VI SEM Pulmonary:     Effort: Pulmonary effort is normal. No respiratory distress.     Breath sounds: No stridor. No wheezing, rhonchi or rales.     Comments: Lungs CTAB. Respirations even and unlabored. Musculoskeletal:     Comments: There is mild swelling to the right knee without palpable effusion.  No erythema or significant heat to touch.  There is active and passive range of motion of the right knee, though this is limited with flexion past ~70 degrees as well as hyperextension. No deformity or crepitus.  Skin:    General: Skin is warm and dry.     Coloration: Skin is not pale.     Findings: No erythema or rash.  Neurological:     General: No focal deficit present.     Mental Status: He is alert and oriented to person, place, and time.     Coordination: Coordination normal.     Comments: Sensation to light touch intact in BLE. Ambulatory with antalgic gait.  Psychiatric:        Behavior: Behavior normal.      ED Treatments / Results  Labs (all labs ordered are listed, but only abnormal results are displayed) Labs Reviewed  CBC WITH DIFFERENTIAL/PLATELET - Abnormal; Notable for the following components:      Result Value   WBC 15.3 (*)    RBC 3.97 (*)    Hemoglobin 11.1 (*)    HCT 34.5 (*)    Neutro Abs 11.4 (*)    Monocytes Absolute 1.1 (*)    All other components within normal limits  BASIC METABOLIC PANEL - Abnormal; Notable for the following components:   Glucose, Bld 100 (*)    All other components within normal limits  CULTURE, BLOOD (ROUTINE X 2)   CULTURE, BLOOD (ROUTINE X 2)  CULTURE, BLOOD (SINGLE)  SARS CORONAVIRUS 2 (TAT 6-24 HRS)  LACTIC ACID, PLASMA  SEDIMENTATION RATE    EKG None  Radiology Dg Knee Complete 4 Views Right  Result Date: 01/08/2019 CLINICAL DATA:  Initial evaluation for acute knee pain, no injury. EXAM: RIGHT KNEE - COMPLETE 4+ VIEW COMPARISON:  None. FINDINGS:  No evidence of fracture, dislocation, or joint effusion. No evidence of arthropathy or other focal bone abnormality. Soft tissues are unremarkable. IMPRESSION: Negative. Electronically Signed   By: Rise Mu M.D.   On: 01/08/2019 03:33    Procedures .Critical Care Performed by: Antony Madura, PA-C Authorized by: Antony Madura, PA-C   Critical care provider statement:    Critical care time (minutes):  45   Critical care was necessary to treat or prevent imminent or life-threatening deterioration of the following conditions: SIRS.   Critical care was time spent personally by me on the following activities:  Discussions with consultants, evaluation of patient's response to treatment, examination of patient, ordering and performing treatments and interventions, ordering and review of laboratory studies, ordering and review of radiographic studies, pulse oximetry, re-evaluation of patient's condition, obtaining history from patient or surrogate and review of old charts   (including critical care time)  Medications Ordered in ED Medications  vancomycin (VANCOCIN) 1,750 mg in sodium chloride 0.9 % 500 mL IVPB (has no administration in time range)  ampicillin (OMNIPEN) 2 g in sodium chloride 0.9 % 100 mL IVPB (2 g Intravenous New Bag/Given 01/08/19 0512)  gentamicin (GARAMYCIN) IVPB 80 mg (has no administration in time range)  ibuprofen (ADVIL) tablet 400 mg (400 mg Oral Given 01/07/19 2356)  sodium chloride 0.9 % bolus 1,000 mL (1,000 mLs Intravenous New Bag/Given 01/08/19 0511)  ketorolac (TORADOL) 30 MG/ML injection 15 mg (15 mg Intravenous  Given 01/08/19 0511)     Initial Impression / Assessment and Plan / ED Course  I have reviewed the triage vital signs and the nursing notes.  Pertinent labs & imaging results that were available during my care of the patient were reviewed by me and considered in my medical decision making (see chart for details).        33 year old male with a history of IV drug use presents to the emergency department for evaluation of right knee pain.  Was seen 2 days ago for pain in his left hip and medial left clavicle.  Noted to be febrile with EMS to 102 F.  Low-grade fever on arrival to the ED with tachycardia.  He has a leukocytosis of 15.3 with left shift.  Patient neurovascularly intact on exam.  His exam is not overtly concerning for septic right knee joint, though he is an IV drug abuser and does have a 1/6 systolic ejection murmur on exam.  While this may be an innocent murmur, his fever, leukocytosis, history of IV drug use, migratory polyarthralgia raises concern for possible early/developing endocarditis.  The patient appears clinically well at this time.  His temperature has improved with antipyretics.  Started on IV fluids as well as IV antibiotics.  Will admit to [facility] for ongoing evaluation.   Final Clinical Impressions(s) / ED Diagnoses   Final diagnoses:  SIRS (systemic inflammatory response syndrome) (HCC)  Migratory polyarthritis  IVDU (intravenous drug user)  Systolic ejection murmur    ED Discharge Orders    None       Antony Madura, PA-C 01/08/19 0518    Palumbo, April, MD 01/08/19 [id number]

## 2019-01-08 NOTE — ED Notes (Signed)
Pt ambulated without assistance with slight limp. Gait steady

## 2019-01-08 NOTE — Progress Notes (Signed)
Patient seen and examined at bedside, patient admitted after midnight, please see earlier detailed admission note by Elwyn Reach, MD. Briefly, patient presented with right knee pain with associated fever and chills concerning for bacteremia and ?endocarditis. Obtained knee x-ray which was unremarkable. Exam significant for mild effusion but otherwise swelling resolved. Long discussion with patient regarding opiate use. Patient uses heroin and injects antecubital. Last use was yesterday. Discussed managing opiate withdrawal vs management with Suboxone. He has used Suboxone film in the past with adverse reaction but is willing to try tablet. Will continue clonidine for withdrawal symptoms in addition to symptom management with Bentyl, Atarax, imodium, Robaxin, Zofran. Transthoracic Echocardiogram not significant for vegetation, and will hold off on Transesophageal Echocardiogram unless bacteremia present. If right knee worsens, will need ortho consult for aspiration.   Cordelia Poche, MD [facility] 01/08/2019, 3:43 PM

## 2019-01-08 NOTE — ED Notes (Signed)
ED TO INPATIENT HANDOFF REPORT  Name/Age/Gender Grant Tapia 33 y.o. male  Code Status    Code Status Orders  (From admission, onward)         Start     Ordered   01/08/19 0540  Full code  Continuous     01/08/19 0540        Code Status History    Date Active Date Inactive Code Status Order ID Comments User Context   05/08/2017 0527 05/08/2017 1812 Full Code [id number]  Palumbo, April, MD ED   12/30/2016 0511 01/01/2017 1627 Full Code [id number]  Ivor Costa, MD ED   11/19/2016 0728 11/23/2016 1834 Full Code [id number]  Vashti Hey, MD Inpatient   07/18/2016 2108 07/20/2016 2239 Full Code [id number]  Beverely Low, MD Inpatient   07/15/2016 1352 07/17/2016 2027 Full Code [id number]  Debbe Odea, MD Inpatient   07/15/2016 0415 07/15/2016 1351 Full Code [id number]  Ward, Delice Bison, DO ED   03/27/2014 0255 03/30/2014 1550 Full Code [id number]  Corey Harold, NP Inpatient   Advance Care Planning Activity      Home/SNF/Other Home  Chief Complaint right knee pain/fever  Level of Care/Admitting Diagnosis ED Disposition    ED Disposition Condition [facility] Area: [facility] [[id number]]  Level of Care: Telemetry [5]  Admit to tele based on following criteria: Complex arrhythmia (Bradycardia/Tachycardia)  Covid Evaluation: Asymptomatic Screening Protocol (No Symptoms)  Diagnosis: Sepsis ([place]) [[id number]]  Admitting Physician: Elwyn Reach [[id number]]  Attending Physician: Elwyn Reach [[id number]]  Estimated length of stay: past midnight tomorrow  Certification:: I certify this patient will need inpatient services for at least 2 midnights  PT Class (Do Not Modify): Inpatient [101]  PT Acc Code (Do Not Modify): Private [1]       Medical History Past Medical History:  Diagnosis Date  . Heroin abuse ([place])   . Hypertension   . IV drug user   . Overdose   . Polysubstance abuse (HCC)     Allergies Allergies  Allergen Reactions  .  Bee Venom Swelling    IV Location/Drains/Wounds Patient Lines/Drains/Airways Status   Active Line/Drains/Airways    Name:   Placement date:   Placement time:   Site:   Days:   Peripheral IV 01/08/19 Left Forearm   01/08/19    0432    Forearm   less than 1          Labs/Imaging Results for orders placed or performed during the hospital encounter of 01/08/19 (from the past 48 hour(s))  CBC with Differential     Status: Abnormal   Collection Time: 01/08/19  2:48 AM  Result Value Ref Range   WBC 15.3 (H) 4.0 - 10.5 K/uL   RBC 3.97 (L) 4.22 - 5.81 MIL/uL   Hemoglobin 11.1 (L) 13.0 - 17.0 g/dL   HCT 34.5 (L) 39.0 - 52.0 %   MCV 86.9 80.0 - 100.0 fL   MCH 28.0 26.0 - 34.0 pg   MCHC 32.2 30.0 - 36.0 g/dL   RDW 13.5 11.5 - 15.5 %   Platelets 352 150 - 400 K/uL   nRBC 0.0 0.0 - 0.2 %   Neutrophils Relative % 74 %   Neutro Abs 11.4 (H) 1.7 - 7.7 K/uL   Lymphocytes Relative 17 %   Lymphs Abs 2.6 0.7 - 4.0 K/uL   Monocytes Relative 7 %   Monocytes Absolute 1.1 (H) 0.1 - 1.0 K/uL   Eosinophils  Relative 2 %   Eosinophils Absolute 0.3 0.0 - 0.5 K/uL   Basophils Relative 0 %   Basophils Absolute 0.0 0.0 - 0.1 K/uL   Immature Granulocytes 0 %   Abs Immature Granulocytes 0.06 0.00 - 0.07 K/uL    Comment: Performed at [facility], [address] [place], [place] [address]  Basic metabolic panel     Status: Abnormal   Collection Time: 01/08/19  2:48 AM  Result Value Ref Range   Sodium 136 135 - 145 mmol/L   Potassium 4.0 3.5 - 5.1 mmol/L   Chloride 103 98 - 111 mmol/L   CO2 22 22 - 32 mmol/L   Glucose, Bld 100 (H) 70 - 99 mg/dL   BUN 11 6 - 20 mg/dL   Creatinine, Ser 6.040.78 0.61 - 1.24 mg/dL   Calcium 9.0 8.9 - 54.010.3 mg/dL   GFR calc non Af Amer >60 >60 mL/min   GFR calc Af Amer >60 >60 mL/min   Anion gap 11 5 - 15    Comment: Performed at [facility], [address] [place], [place] [address]  Sedimentation rate     Status: Abnormal    Collection Time: 01/08/19  2:48 AM  Result Value Ref Range   Sed Rate 63 (H) 0 - 16 mm/hr    Comment: Performed at [facility], [address] [place], [place] [address]  Lactic acid, plasma     Status: None   Collection Time: 01/08/19  2:54 AM  Result Value Ref Range   Lactic Acid, Venous 0.6 0.5 - 1.9 mmol/L    Comment: Performed at [facility], [address] [place], [place] [address]  Comprehensive metabolic panel     Status: Abnormal   Collection Time: 01/08/19  5:40 AM  Result Value Ref Range   Sodium 138 135 - 145 mmol/L   Potassium 3.7 3.5 - 5.1 mmol/L   Chloride 107 98 - 111 mmol/L   CO2 24 22 - 32 mmol/L   Glucose, Bld 99 70 - 99 mg/dL   BUN 10 6 - 20 mg/dL   Creatinine, Ser 3.080.74 0.61 - 1.24 mg/dL   Calcium 8.5 (L) 8.9 - 10.3 mg/dL   Total Protein 6.4 (L) 6.5 - 8.1 g/dL   Albumin 2.9 (L) 3.5 - 5.0 g/dL   AST 28 15 - 41 U/L   ALT 28 0 - 44 U/L   Alkaline Phosphatase 49 38 - 126 U/L   Total Bilirubin 0.4 0.3 - 1.2 mg/dL   GFR calc non Af Amer >60 >60 mL/min   GFR calc Af Amer >60 >60 mL/min   Anion gap 7 5 - 15    Comment: Performed at [facility], [address] [place], [place] [address]  CBC     Status: Abnormal   Collection Time: 01/08/19  5:40 AM  Result Value Ref Range   WBC 11.9 (H) 4.0 - 10.5 K/uL   RBC 3.71 (L) 4.22 - 5.81 MIL/uL   Hemoglobin 10.6 (L) 13.0 - 17.0 g/dL   HCT 69.632.5 (L) 29.539.0 - 28.452.0 %   MCV 87.6 80.0 - 100.0 fL   MCH 28.6 26.0 - 34.0 pg   MCHC 32.6 30.0 - 36.0 g/dL   RDW 13.213.7 44.011.5 - 10.215.5 %   Platelets 318 150 - 400 K/uL   nRBC 0.0 0.0 - 0.2 %    Comment: Performed at [facility], [address]  [place], [place] [address]   Dg Knee Complete 4 Views Right  Result Date: 01/08/2019 CLINICAL DATA:  Initial evaluation for acute knee pain, no injury. EXAM: RIGHT KNEE - COMPLETE 4+ VIEW COMPARISON:  None. FINDINGS: No evidence of fracture, dislocation, or joint  effusion. No evidence of arthropathy or other focal bone abnormality. Soft tissues are unremarkable. IMPRESSION: Negative. Electronically Signed   By: Rise Mu M.D.   On: 01/08/2019 03:33    Pending Labs Unresulted Labs (From admission, onward)    Start     Ordered   01/08/19 0540  HIV Antibody  (Routine Testing)  Once,   STAT     01/08/19 0540   01/08/19 0441  SARS CORONAVIRUS 2 (TAT 6-24 HRS) Nasopharyngeal Nasopharyngeal Swab  (Asymptomatic/Tier 2 Patients Labs)  ONCE - STAT,   STAT    Question Answer Comment  Is this test for diagnosis or screening Screening   Symptomatic for COVID-19 as defined by CDC No   Hospitalized for COVID-19 No   Admitted to ICU for COVID-19 No   Previously tested for COVID-19 No   Resident in a congregate (group) care setting No   Employed in healthcare setting No      01/08/19 0440   01/08/19 0432  Culture, blood (single)  ONCE - STAT,   STAT     01/08/19 0431   01/08/19 0226  Culture, blood (Routine X 2) w Reflex to ID Panel  BLOOD CULTURE X 2,   STAT     01/08/19 0225          Vitals/Pain Today's Vitals   01/08/19 1001 01/08/19 1108 01/08/19 1109 01/08/19 1230  BP: (!) 137/103  (!) 147/108 (!) 148/93  Pulse: 92  89 91  Resp: 16  16 (!) 21  Temp:   98.6 F (37 C)   TempSrc:   Oral   SpO2: 100%  99% 100%  Weight:      Height:      PainSc:  6       Isolation Precautions No active isolations  Medications Medications  ampicillin (OMNIPEN) 2 g in sodium chloride 0.9 % 100 mL IVPB (2 g Intravenous New Bag/Given 01/08/19 1009)  gentamicin (GARAMYCIN) IVPB 80 mg (0 mg Intravenous Stopped 01/08/19 0634)  0.9 %  sodium chloride infusion ( Intravenous New Bag/Given 01/08/19 0604)  ondansetron (ZOFRAN) tablet 4 mg (has no administration in time range)    Or  ondansetron (ZOFRAN) injection 4 mg (has no administration in time range)  acetaminophen (TYLENOL) tablet 650 mg (650 mg Oral Given 01/08/19 1002)    Or  acetaminophen (TYLENOL)  suppository 650 mg ( Rectal See Alternative 01/08/19 1002)  vancomycin (VANCOCIN) 1,250 mg in sodium chloride 0.9 % 250 mL IVPB (has no administration in time range)  ketorolac (TORADOL) 30 MG/ML injection 15 mg (15 mg Intravenous Given 01/08/19 1214)  ibuprofen (ADVIL) tablet 400 mg (400 mg Oral Given 01/07/19 2356)  sodium chloride 0.9 % bolus 1,000 mL (0 mLs Intravenous Stopped 01/08/19 0604)  ketorolac (TORADOL) 30 MG/ML injection 15 mg (15 mg Intravenous Given 01/08/19 0511)  vancomycin (VANCOCIN) 1,750 mg in sodium chloride 0.9 % 500 mL IVPB (0 mg Intravenous Stopped 01/08/19 1007)    Mobility walks

## 2019-01-08 NOTE — ED Notes (Addendum)
Only able to obtain 1 set of cultures. Attempted to stick x2 for second set. Lovena Le, RN attempted to stick x2 without success.

## 2019-01-08 NOTE — ED Notes (Signed)
Spoke with lab and they are adding on HIV

## 2019-01-08 NOTE — Progress Notes (Signed)
Pharmacy Antibiotic Note  Grant Tapia is a 33 y.o. 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  Pharmacy has been consulted for Vancomycin, ampicillin, and gentamicin dosing.  Plan: Vancomycin 1750mg  iv x1 Vancomycin 1250 mg IV Q 8 hrs. Goal AUC 400-550. Expected AUC: 516 SCr used: 0.8 (adjusted)  Ampicillin 2gm iv q4hr  Gentamicin 80mg  iv q8hr (3mg /kg/day)   Height: 5\' 10"  (177.8 cm) Weight: 185 lb (83.9 kg) IBW/kg (Calculated) : 73  Temp (24hrs), Avg:100 F (37.8 C), Min:99.8 F (37.7 C), Max:100.1 F (37.8 C)  Recent Labs  Lab 01/08/19 0248 01/08/19 0254  WBC 15.3*  --   CREATININE 0.78  --   LATICACIDVEN  --  0.6    Estimated Creatinine Clearance: 139.4 mL/min (by C-G formula based on SCr of 0.78 mg/dL).    Allergies  Allergen Reactions  . Bee Venom Swelling    Antimicrobials this admission: Vancomycin 01/08/2019 >> Ampicillin 01/08/2019 >>  Gentamicin 01/08/2019 >>  Dose adjustments this admission: -  Microbiology results: -  Thank you for allowing pharmacy to be a part of this patient's care.  Nani Skillern Crowford 01/08/2019 6:09 AM

## 2019-01-09 LAB — RAPID URINE DRUG SCREEN, HOSP PERFORMED
Amphetamines: POSITIVE — AB
Barbiturates: NOT DETECTED
Benzodiazepines: NOT DETECTED
Cocaine: NOT DETECTED
Opiates: NOT DETECTED
Tetrahydrocannabinol: NOT DETECTED

## 2019-01-09 LAB — CREATININE, SERUM
Creatinine, Ser: 0.72 mg/dL (ref 0.61–1.24)
GFR calc Af Amer: >60 mL/min (ref >60)
GFR calc non Af Amer: >60 mL/min (ref >60)

## 2019-01-09 LAB — HIV ANTIBODY (ROUTINE TESTING W REFLEX): HIV Screen 4th Generation wRfx: NONREACTIVE

## 2019-01-09 MED ORDER — SODIUM CHLORIDE 0.9 % IV SOLN
2.0000 g | INTRAVENOUS | Status: DC
  Administered 2019-01-09 – 2019-01-11 (×3): 2 g via INTRAVENOUS
  Filled 2019-01-09 (×3): qty 2

## 2019-01-09 NOTE — Progress Notes (Signed)
PROGRESS NOTE    Grant Tapia  HYQ:[id number] DOB: 07-04-1985 DOA: 01/08/2019 PCP: Patient, No Pcp Per    Brief Narrative:  Patient is 33 year old male with history of IV drug abuse, hypertension who came to the emergency room with right knee pain of sudden onset with swelling.  No history of trauma.  No fever or chills.  In the emergency room, temperature 100.1, blood pressure is stable.  WBC count 15.3.  Blood cultures were obtained and patient was admitted with antibiotics.   Assessment & Plan:   Principal Problem:   Sepsis (HCC) Active Problems:   Alcohol abuse   Heroin abuse (HCC)   Methamphetamine abuse (HCC)   Cocaine abuse (HCC)  Sepsis, present on admission in a patient with IV drug abuse: Has knee swelling, but no effusion and erythema. Blood cultures are negative so far. Treated with vancomycin and cefepime, now remains on Rocephin and vancomycin. We will continue until final cultures for 48 hours. Echocardiogram did not show any vegetation, with negative blood cultures there is no indication to do TEE.  Polysubstance abuse: Patient using multiple substances, injectable drugs and alcohol.  Heroin and cocaine.  Does not have obvious evidence of withdrawal. On symptomatic treatment with clonidine baclofen Tylenol. Patient was started on Suboxone by earlier provider.  He is motivated to use Suboxone and follow-up outpatient.  Will have case management give him resources.  Advance activities.  Ambulate in the hallway.  Monitor for symptoms. Recheck CBC and electrolytes with magnesium and phosphorus in the morning.  DVT prophylaxis: SCDs Code Status: Full code Family Communication: None Disposition Plan: Home.  Pending improvement.   Consultants:   None  Procedures:   None  Antimicrobials:   Vancomycin, 01/08/2019---  Rocephin, 01/01/2019---    Subjective: Patient seen and examined.  Afebrile overnight.  He looks stable, however, he states that  he is developing some withdrawal.  Right knee is subsiding.  Pain is less.  Objective: Vitals:   01/09/19 0759 01/09/19 0950 01/09/19 1203 01/09/19 1625  BP: 128/81 125/65 117/75 120/72  Pulse: 76 86 71 70  Resp: 20  18 18   Temp: 98.6 F (37 C)  98.8 F (37.1 C) 98.8 F (37.1 C)  TempSrc: Oral  Oral   SpO2: 100%  99% 100%  Weight:      Height:        Intake/Output Summary (Last 24 hours) at 01/09/2019 1641 Last data filed at 01/09/2019 1400 Gross per 24 hour  Intake 1497.72 ml  Output -  Net 1497.72 ml   Filed Weights   01/07/19 2343  Weight: 83.9 kg    Examination:  General exam: Appears calm and comfortable  Respiratory system: Clear to auscultation. Respiratory effort normal. Cardiovascular system: S1 & S2 heard, RRR. No JVD, murmurs, rubs, gallops or clicks. No pedal edema. Gastrointestinal system: Abdomen is nondistended, soft and nontender. No organomegaly or masses felt. Normal bowel sounds heard. Central nervous system: Alert and oriented. No focal neurological deficits. Extremities: Symmetric 5 x 5 power. Asymmetrical knees, right larger than left.  Patellar tap negative.  No erythema or fluctuation. Skin: No rashes, lesions or ulcers Psychiatry: Judgement and insight appear normal. Mood & affect appropriate.     Data Reviewed: I have personally reviewed following labs and imaging studies  CBC: Recent Labs  Lab 01/08/19 0248 01/08/19 0540  WBC 15.3* 11.9*  NEUTROABS 11.4*  --   HGB 11.1* 10.6*  HCT 34.5* 32.5*  MCV 86.9 87.6  PLT 352 318  Basic Metabolic Panel: Recent Labs  Lab 01/08/19 0248 01/08/19 0540 01/09/19 0841  NA 136 138  --   K 4.0 3.7  --   CL 103 107  --   CO2 22 24  --   GLUCOSE 100* 99  --   BUN 11 10  --   CREATININE 0.78 0.74 0.72  CALCIUM 9.0 8.5*  --    GFR: Estimated Creatinine Clearance: 139.4 mL/min (by C-G formula based on SCr of 0.72 mg/dL). Liver Function Tests: Recent Labs  Lab 01/08/19 0540  AST 28  ALT  28  ALKPHOS 49  BILITOT 0.4  PROT 6.4*  ALBUMIN 2.9*   No results for input(s): LIPASE, AMYLASE in the last 168 hours. No results for input(s): AMMONIA in the last 168 hours. Coagulation Profile: No results for input(s): INR, PROTIME in the last 168 hours. Cardiac Enzymes: No results for input(s): CKTOTAL, CKMB, CKMBINDEX, TROPONINI in the last 168 hours. BNP (last 3 results) No results for input(s): PROBNP in the last 8760 hours. HbA1C: No results for input(s): HGBA1C in the last 72 hours. CBG: No results for input(s): GLUCAP in the last 168 hours. Lipid Profile: No results for input(s): CHOL, HDL, LDLCALC, TRIG, CHOLHDL, LDLDIRECT in the last 72 hours. Thyroid Function Tests: No results for input(s): TSH, T4TOTAL, FREET4, T3FREE, THYROIDAB in the last 72 hours. Anemia Panel: No results for input(s): VITAMINB12, FOLATE, FERRITIN, TIBC, IRON, RETICCTPCT in the last 72 hours. Sepsis Labs: Recent Labs  Lab 01/08/19 0254  LATICACIDVEN 0.6    Recent Results (from the past 240 hour(s))  Culture, blood (Routine X 2) w Reflex to ID Panel     Status: None (Preliminary result)   Collection Time: 01/08/19  2:49 AM   Specimen: BLOOD  Result Value Ref Range Status   Specimen Description   Final    BLOOD LEFT ANTECUBITAL Performed at [facility] [address], [place], [place] [address]    Special Requests   Final    BOTTLES DRAWN AEROBIC AND ANAEROBIC Blood Culture adequate volume Performed at [facility] [address], [place], [place] [address]    Culture   Final    NO GROWTH 1 DAY Performed at [facility], [place] [address], [place], [place] [address]    Report Status PENDING  Incomplete  Culture, blood (Routine X 2) w Reflex to ID Panel     Status: None (Preliminary result)   Collection Time: 01/08/19  4:37 AM   Specimen: BLOOD LEFT FOREARM  Result Value Ref Range Status   Specimen Description   Final    BLOOD LEFT FOREARM  Performed at [facility] [address], [place], [place] [address]    Special Requests   Final    BOTTLES DRAWN AEROBIC AND ANAEROBIC Blood Culture adequate volume Performed at [facility] [address], [place],  [address]    Culture   Final    NO GROWTH 1 DAY Performed at [facility], [place] [address], [place],  [address]    Report Status PENDING  Incomplete  SARS CORONAVIRUS 2 (TAT 6-24 HRS) Nasopharyngeal Nasopharyngeal Swab     Status: None   Collection Time: 01/08/19  4:41 AM   Specimen: Nasopharyngeal Swab  Result Value Ref Range Status   SARS Coronavirus 2 NEGATIVE NEGATIVE Final    Comment: (NOTE) SARS-CoV-2 target nucleic acids are NOT DETECTED. The SARS-CoV-2 RNA is generally detectable in upper and  lower respiratory specimens during the acute phase of infection. Negative results do not preclude SARS-CoV-2 infection, do not rule out co-infections with other pathogens, and should not be used as the sole basis for treatment or other patient management decisions. Negative results must be combined with clinical observations, patient history, and epidemiological information. The expected result is Negative. Fact Sheet for Patients: HairSlick.no Fact Sheet for Healthcare Providers: quierodirigir.com This test is not yet approved or cleared by the [place] FDA and  has been authorized for detection and/or diagnosis of SARS-CoV-2 by FDA under an Emergency Use Authorization (EUA). This EUA will remain  in effect (meaning this test can be used) for the duration of the COVID-19 declaration under Section 56 4(b)(1) of the Act, 21 U.S.C. section 360bbb-3(b)(1), unless the authorization is terminated or revoked sooner. Performed at [facility], [address] [place], [place] [address]          Radiology Studies: Dg Knee Complete 4 Views Right   Result Date: 01/08/2019 CLINICAL DATA:  Initial evaluation for acute knee pain, no injury. EXAM: RIGHT KNEE - COMPLETE 4+ VIEW COMPARISON:  None. FINDINGS: No evidence of fracture, dislocation, or joint effusion. No evidence of arthropathy or other focal bone abnormality. Soft tissues are unremarkable. IMPRESSION: Negative. Electronically Signed   By: Rise Mu M.D.   On: 01/08/2019 03:33        Scheduled Meds: . buprenorphine-naloxone  1 tablet Sublingual BID  . cloNIDine  0.1 mg Oral QID   Followed by  . [START ON 01/10/2019] cloNIDine  0.1 mg Oral BH-qamhs   Followed by  . [START ON 01/13/2019] cloNIDine  0.1 mg Oral QAC breakfast   Continuous Infusions: . sodium chloride 75 mL/hr at 01/09/19 1400  . cefTRIAXone (ROCEPHIN)  IV 2 g (01/09/19 1342)  . vancomycin 1,250 mg (01/09/19 1629)     LOS: 1 day    Time spent: 25 minutes    Dorcas Carrow, MD [facility] Pager [phone]  If 7PM-7AM, please contact night-coverage [email or web address] Password [facility] 01/09/2019, 4:41 PM

## 2019-01-10 LAB — BASIC METABOLIC PANEL
Anion gap: 6 (ref 5–15)
BUN: 12 mg/dL (ref 6–20)
CO2: 26 mmol/L (ref 22–32)
Calcium: 8.8 mg/dL — ABNORMAL LOW (ref 8.9–10.3)
Chloride: 107 mmol/L (ref 98–111)
Creatinine, Ser: 0.81 mg/dL (ref 0.61–1.24)
GFR calc Af Amer: >60 mL/min (ref >60)
GFR calc non Af Amer: >60 mL/min (ref >60)
Glucose, Bld: 123 mg/dL — ABNORMAL HIGH (ref 70–99)
Potassium: 4.2 mmol/L (ref 3.5–5.1)
Sodium: 139 mmol/L (ref 135–145)

## 2019-01-10 LAB — CBC WITH DIFFERENTIAL/PLATELET
Abs Immature Granulocytes: 0.09 10*3/uL — ABNORMAL HIGH (ref 0.00–0.07)
Basophils Absolute: 0.1 10*3/uL (ref 0.0–0.1)
Basophils Relative: 1 %
Eosinophils Absolute: 0.4 10*3/uL (ref 0.0–0.5)
Eosinophils Relative: 3 %
HCT: 34.2 % — ABNORMAL LOW (ref 39.0–52.0)
Hemoglobin: 11.1 g/dL — ABNORMAL LOW (ref 13.0–17.0)
Immature Granulocytes: 1 %
Lymphocytes Relative: 20 %
Lymphs Abs: 2.6 10*3/uL (ref 0.7–4.0)
MCH: 28.3 pg (ref 26.0–34.0)
MCHC: 32.5 g/dL (ref 30.0–36.0)
MCV: 87.2 fL (ref 80.0–100.0)
Monocytes Absolute: 0.9 10*3/uL (ref 0.1–1.0)
Monocytes Relative: 7 %
Neutro Abs: 8.7 10*3/uL — ABNORMAL HIGH (ref 1.7–7.7)
Neutrophils Relative %: 68 %
Platelets: 363 10*3/uL (ref 150–400)
RBC: 3.92 MIL/uL — ABNORMAL LOW (ref 4.22–5.81)
RDW: 13.5 % (ref 11.5–15.5)
WBC: 12.8 10*3/uL — ABNORMAL HIGH (ref 4.0–10.5)
nRBC: 0 % (ref 0.0–0.2)

## 2019-01-10 LAB — BLOOD CULTURE ID PANEL (REFLEXED)

## 2019-01-10 LAB — VANCOMYCIN, PEAK: Vancomycin Pk: 39 ug/mL (ref 30–40)

## 2019-01-10 LAB — MAGNESIUM: Magnesium: 1.7 mg/dL (ref 1.7–2.4)

## 2019-01-10 LAB — VANCOMYCIN, TROUGH: Vancomycin Tr: 20 ug/mL (ref 15–20)

## 2019-01-10 LAB — PHOSPHORUS: Phosphorus: 4.3 mg/dL (ref 2.5–4.6)

## 2019-01-10 NOTE — Progress Notes (Signed)
PROGRESS NOTE    Grant Tapia  XHB:[id number] DOB: 01/04/1986 DOA: 01/08/2019 PCP: Patient, No Pcp Per    Brief Narrative:  Patient is 33 year old male with history of IV drug abuse, hypertension who came to the emergency room with right knee pain of sudden onset with swelling.  No history of trauma.  No fever or chills.  In the emergency room, temperature 100.1, blood pressure is stable.  WBC count 15.3.  Blood cultures were obtained and patient was admitted with antibiotics.  01/10/2019: 1 of the 2 blood culture bottles with gram-positive rods.  Assessment & Plan:   Principal Problem:   Sepsis ([place]) Active Problems:   Alcohol abuse   Heroin abuse ([place])   Methamphetamine abuse ([place])   Cocaine abuse ([place])  Sepsis, present on admission in a patient with IV drug abuse: Has knee swelling, but no effusion and erythema. Blood cultures , 1 of the 2+ with gram-positive rods. Treated with vancomycin and cefepime, now remains on Rocephin and vancomycin. We will continue until final cultures. Echocardiogram did not show any vegetation, if final blood culture shows Staphylococcus, will order TEE.  Polysubstance abuse: Patient using multiple substances, injectable drugs and alcohol.  Heroin and cocaine.  Does not have obvious evidence of withdrawal. On symptomatic treatment with clonidine, baclofen, Tylenol. Patient was started on Suboxone by earlier provider.  He is motivated to use Suboxone and follow-up outpatient.  Will have case management give him resources.  DVT prophylaxis: SCDs Code Status: Full code Family Communication: None Disposition Plan: Home.  Pending improvement.   Consultants:   None  Procedures:   None  Antimicrobials:   Vancomycin, 01/08/2019---  Rocephin, 01/08/2019---    Subjective: Patient seen and examined.  Afebrile overnight.  Still has some swelling on the right knee, however better than before. Objective: Vitals:   01/09/19 1951  01/10/19 0557 01/10/19 0807 01/10/19 1210  BP: 131/76 133/61 (!) 143/97 118/82  Pulse: 89 70 72 65  Resp: 17 18 20 18   Temp: 98.4 F (36.9 C) 98.9 F (37.2 C) 98.3 F (36.8 C) 98.7 F (37.1 C)  TempSrc:  Oral Oral Oral  SpO2: 98% 100% 100% 100%  Weight:      Height:        Intake/Output Summary (Last 24 hours) at 01/10/2019 1216 Last data filed at 01/10/2019 0900 Gross per 24 hour  Intake 2494.55 ml  Output -  Net 2494.55 ml   Filed Weights   01/07/19 2343  Weight: 83.9 kg    Examination:  General exam: Appears calm and comfortable  Respiratory system: Clear to auscultation. Respiratory effort normal. Cardiovascular system: S1 & S2 heard, RRR. No JVD, murmurs, rubs, gallops or clicks. No pedal edema. Gastrointestinal system: Abdomen is nondistended, soft and nontender. No organomegaly or masses felt. Normal bowel sounds heard. Central nervous system: Alert and oriented. No focal neurological deficits. Extremities: Symmetric 5 x 5 power. Asymmetrical knees, right larger than left.  Patellar tap negative.  No erythema or fluctuation. Skin: No rashes, lesions or ulcers Psychiatry: Judgement and insight appear normal. Mood & affect appropriate.     Data Reviewed: I have personally reviewed following labs and imaging studies  CBC: Recent Labs  Lab 01/08/19 0248 01/08/19 0540 01/10/19 0704  WBC 15.3* 11.9* 12.8*  NEUTROABS 11.4*  --  8.7*  HGB 11.1* 10.6* 11.1*  HCT 34.5* 32.5* 34.2*  MCV 86.9 87.6 87.2  PLT 352 318 810   Basic Metabolic Panel: Recent Labs  Lab 01/08/19 0248  01/08/19 0540 01/09/19 0841 01/10/19 0704  NA 136 138  --  139  K 4.0 3.7  --  4.2  CL 103 107  --  107  CO2 22 24  --  26  GLUCOSE 100* 99  --  123*  BUN 11 10  --  12  CREATININE 0.78 0.74 0.72 0.81  CALCIUM 9.0 8.5*  --  8.8*  MG  --   --   --  1.7  PHOS  --   --   --  4.3   GFR: Estimated Creatinine Clearance: 137.7 mL/min (by C-G formula based on SCr of 0.81 mg/dL). Liver  Function Tests: Recent Labs  Lab 01/08/19 0540  AST 28  ALT 28  ALKPHOS 49  BILITOT 0.4  PROT 6.4*  ALBUMIN 2.9*   No results for input(s): LIPASE, AMYLASE in the last 168 hours. No results for input(s): AMMONIA in the last 168 hours. Coagulation Profile: No results for input(s): INR, PROTIME in the last 168 hours. Cardiac Enzymes: No results for input(s): CKTOTAL, CKMB, CKMBINDEX, TROPONINI in the last 168 hours. BNP (last 3 results) No results for input(s): PROBNP in the last 8760 hours. HbA1C: No results for input(s): HGBA1C in the last 72 hours. CBG: No results for input(s): GLUCAP in the last 168 hours. Lipid Profile: No results for input(s): CHOL, HDL, LDLCALC, TRIG, CHOLHDL, LDLDIRECT in the last 72 hours. Thyroid Function Tests: No results for input(s): TSH, T4TOTAL, FREET4, T3FREE, THYROIDAB in the last 72 hours. Anemia Panel: No results for input(s): VITAMINB12, FOLATE, FERRITIN, TIBC, IRON, RETICCTPCT in the last 72 hours. Sepsis Labs: Recent Labs  Lab 01/08/19 0254  LATICACIDVEN 0.6    Recent Results (from the past 240 hour(s))  Culture, blood (Routine X 2) w Reflex to ID Panel     Status: None (Preliminary result)   Collection Time: 01/08/19  2:49 AM   Specimen: BLOOD  Result Value Ref Range Status   Specimen Description   Final    BLOOD LEFT ANTECUBITAL Performed at [facility], [address], [place], [place] [address]    Special Requests   Final    BOTTLES DRAWN AEROBIC AND ANAEROBIC Blood Culture adequate volume Performed at [facility], [address], [place], [place] [address]    Culture  Setup Time   Final    ANAEROBIC BOTTLE ONLY GRAM POSITIVE RODS CRITICAL RESULT CALLED TO, READ BACK BY AND VERIFIED WITH: Hershal Coria PHARM @ 9485 ON 01/10/19 BY ROBINSON Z.     Culture   Final    NO GROWTH 2 DAYS Performed at [facility], [address] [place], [place] [address]    Report Status PENDING   Incomplete  Culture, blood (Routine X 2) w Reflex to ID Panel     Status: None (Preliminary result)   Collection Time: 01/08/19  4:37 AM   Specimen: BLOOD LEFT FOREARM  Result Value Ref Range Status   Specimen Description   Final    BLOOD LEFT FOREARM Performed at [facility], [address] [place], [place] [address]    Special Requests   Final    BOTTLES DRAWN AEROBIC AND ANAEROBIC Blood Culture adequate volume Performed at [facility], [address] [place], [place] [address]    Culture  Setup Time Organism ID to follow  Final   Culture   Final    NO GROWTH 1 DAY Performed at [facility], [address] [place], [place] [address]    Report Status PENDING  Incomplete  Blood Culture ID Panel (Reflexed)     Status: None   Collection Time: 01/08/19  4:37 AM  Result Value Ref Range Status   Enterococcus species NOT DETECTED NOT DETECTED Final   Listeria monocytogenes NOT DETECTED NOT DETECTED Final   Staphylococcus species NOT DETECTED NOT DETECTED Final   Staphylococcus aureus (BCID) NOT DETECTED NOT DETECTED Final   Streptococcus species NOT DETECTED NOT DETECTED Final   Streptococcus agalactiae NOT DETECTED NOT DETECTED Final   Streptococcus pneumoniae NOT DETECTED NOT DETECTED Final   Streptococcus pyogenes NOT DETECTED NOT DETECTED Final   Acinetobacter baumannii NOT DETECTED NOT DETECTED Final   Enterobacteriaceae species NOT DETECTED NOT DETECTED Final   Enterobacter cloacae complex NOT DETECTED NOT DETECTED Final   Escherichia coli NOT DETECTED NOT DETECTED Final   Klebsiella oxytoca NOT DETECTED NOT DETECTED Final   Klebsiella pneumoniae NOT DETECTED NOT DETECTED Final   Proteus species NOT DETECTED NOT DETECTED Final   Serratia marcescens NOT DETECTED NOT DETECTED Final   Haemophilus influenzae NOT DETECTED NOT DETECTED Final   Neisseria meningitidis NOT DETECTED NOT DETECTED Final   Pseudomonas aeruginosa NOT DETECTED  NOT DETECTED Final   Candida albicans NOT DETECTED NOT DETECTED Final   Candida glabrata NOT DETECTED NOT DETECTED Final   Candida krusei NOT DETECTED NOT DETECTED Final   Candida parapsilosis NOT DETECTED NOT DETECTED Final   Candida tropicalis NOT DETECTED NOT DETECTED Final    Comment: Performed at [facility], [address] [place], [place] [address]  SARS CORONAVIRUS 2 (TAT 6-24 HRS) Nasopharyngeal Nasopharyngeal Swab     Status: None   Collection Time: 01/08/19  4:41 AM   Specimen: Nasopharyngeal Swab  Result Value Ref Range Status   SARS Coronavirus 2 NEGATIVE NEGATIVE Final    Comment: (NOTE) SARS-CoV-2 target nucleic acids are NOT DETECTED. The SARS-CoV-2 RNA is generally detectable in upper and lower respiratory specimens during the acute phase of infection. Negative results do not preclude SARS-CoV-2 infection, do not rule out co-infections with other pathogens, and should not be used as the sole basis for treatment or other patient management decisions. Negative results must be combined with clinical observations, patient history, and epidemiological information. The expected result is Negative. Fact Sheet for Patients: HairSlick.nohttps://www.fda.gov/media/138098/download Fact Sheet for Healthcare Providers: quierodirigir.comhttps://www.fda.gov/media/138095/download This test is not yet approved or cleared by the [place] FDA and  has been authorized for detection and/or diagnosis of SARS-CoV-2 by FDA under an Emergency Use Authorization (EUA). This EUA will remain  in effect (meaning this test can be used) for the duration of the COVID-19 declaration under Section 56 4(b)(1) of the Act, 21 U.S.C. section 360bbb-3(b)(1), unless the authorization is terminated or revoked sooner. Performed at [facility], [address] [place], [place] [address]          Radiology Studies: No results found.      Scheduled Meds: . buprenorphine-naloxone  1 tablet Sublingual BID   . cloNIDine  0.1 mg Oral QID   Followed by  . cloNIDine  0.1 mg Oral BH-qamhs   Followed by  . [START ON 01/13/2019] cloNIDine  0.1 mg Oral QAC breakfast   Continuous Infusions: . cefTRIAXone (ROCEPHIN)  IV 2 g (01/09/19 1342)  . vancomycin 1,250 mg (01/10/19 0803)     LOS: 2 days    Time spent: 25 minutes    Dorcas CarrowKuber Dakotah Orrego, MD [facility] Pager  [phone]  If 7PM-7AM, please contact night-coverage [email or web address] Password [email or web address] 01/10/2019, 12:16 PM

## 2019-01-10 NOTE — Progress Notes (Signed)
   01/10/19 1100  Clinical Encounter Type  Visited With Patient  Visit Type Initial;Psychological support;Spiritual support  Referral From Nurse  Consult/Referral To Chaplain  Spiritual Encounters  Spiritual Needs Emotional;Other (Comment) (Spiritual Care Conversation/Support)  Stress Factors  Patient Stress Factors None identified   No needs were identified at this time.   Please, contact Spiritual Care for further assistance.   Chaplain Shanon Ace M.Div., [facility]

## 2019-01-10 NOTE — Progress Notes (Signed)
Brief note: Vancomycin  See note by A Lilliston 9/29 for full details  Vtr = 20 Vpk = 39 AUC = 817 on 1250 mg IV q8h  Plan: Decrease Vancomycin to 1250 mg IV q12h for est AUC = 544  Thanks Lawana Pai R 01/10/2019 10:00 PM

## 2019-01-10 NOTE — Progress Notes (Signed)
Pharmacy Antibiotic Note  Grant Tapia is a 33 y.o. 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  Pharmacy has been consulted for Vancomycin. He is also on Rocephin.  01/10/2019:  Day# 3 Vancomycin  Afebrile  Scr stable- 0.81 today  WBC slightly increased today- 12.8  1/2 Blood cx + GPR, sensitivities pending  Vanc trough =20  Plan:  Check Vancomycin peak ~7p tonight  Adjust Vanc dose as needed to target AUC 400-550  Monitor renal function and cx data   Height: 5\' 10"  (177.8 cm) Weight: 185 lb (83.9 kg) IBW/kg (Calculated) : 73  Temp (24hrs), Avg:98.7 F (37.1 C), Min:98.3 F (36.8 C), Max:99.3 F (37.4 C)  Recent Labs  Lab 01/08/19 0248 01/08/19 0254 01/08/19 0540 01/09/19 0841 01/10/19 0704 01/10/19 1502  WBC 15.3*  --  11.9*  --  12.8*  --   CREATININE 0.78  --  0.74 0.72 0.81  --   LATICACIDVEN  --  0.6  --   --   --   --   VANCOTROUGH  --   --   --   --   --  20    Estimated Creatinine Clearance: 137.7 mL/min (by C-G formula based on SCr of 0.81 mg/dL).    Allergies  Allergen Reactions  . Bee Venom Swelling    Antimicrobials this admission: 9/27 Vancomycin>> 9/27 Ampicillin>> 9/28 9/27 Gentamicin>>9/28 9/28 CTX>>  Dose adjustments this admission: -  Microbiology results: 9/27 BCx x2: 1 of 4 bottle GPR 9/27 COVID: negative   Thank you for allowing pharmacy to be a part of this patient's care.  Netta Cedars, PharmD, BCPS 01/10/2019 4:35 PM

## 2019-01-10 NOTE — Progress Notes (Signed)
PHARMACY - PHYSICIAN COMMUNICATION CRITICAL VALUE ALERT - BLOOD CULTURE IDENTIFICATION (BCID)  Grant Tapia is an 33 y.o. male who presented to [facility] on 01/08/2019 with a chief complaint of right knee pain and swelling  Assessment:  Sepsis  (include suspected source if known) 1 of 4 bottle GPR- lab will not run BCID on this. Probable contaminant.   Name of physician (or Provider) Contacted: Lamar Blinks, NP  Current antibiotics: Roc/Vanc  Changes to prescribed antibiotics recommended:  Patient is on recommended antibiotics - No changes needed  No results found for this or any previous visit.  Dorrene German 01/10/2019  5:11 AM

## 2019-01-11 LAB — CREATININE, SERUM
Creatinine, Ser: 0.94 mg/dL (ref 0.61–1.24)
GFR calc Af Amer: >60 mL/min (ref >60)
GFR calc non Af Amer: >60 mL/min (ref >60)

## 2019-01-11 MED ORDER — VANCOMYCIN HCL 10 G IV SOLR
1250.0000 mg | Freq: Two times a day (BID) | INTRAVENOUS | Status: DC
  Administered 2019-01-12: 1250 mg via INTRAVENOUS
  Filled 2019-01-11 (×2): qty 1250

## 2019-01-11 NOTE — Progress Notes (Signed)
PROGRESS NOTE    Grant Tapia  ZDG:[id number] DOB: [birth year] DOA: 01/08/2019 PCP: Patient, No Pcp Per    Brief Narrative:  Patient is 33 year old male with history of IV drug abuse, hypertension who came to the emergency room with right knee pain of sudden onset with swelling.  No history of trauma.  No fever or chills.  In the emergency room, temperature 100.1, blood pressure is stable.  WBC count 15.3.  Blood cultures were obtained and patient was admitted with antibiotics.  01/10/2019: 1 of the 2 blood culture bottles with gram-positive rods, likely bacillus.  Assessment & Plan:   Principal Problem:   Sepsis ([facility]) Active Problems:   Alcohol abuse   Heroin abuse ([facility])   Methamphetamine abuse ([facility])   Cocaine abuse ([facility])  Sepsis, present on admission in a patient with IV drug abuse: Had knee swelling, but no effusion and erythema. Blood cultures , 1 of the 2+ with gram positive rods.  Possibly bacillus. Treated with vancomycin and cefepime, now remains on Rocephin and vancomycin. Discontinue Rocephin. Vancomycin should be enough to cover for gram-positive rods. Echocardiogram did not show any vegetation, if final blood culture shows Staphylococcus, will order TEE. Will repeat blood cultures today. Clinically improved.  Polysubstance abuse: Patient using multiple substances, injectable drugs and alcohol.  Heroin and cocaine.  Does not have obvious evidence of withdrawal. On symptomatic treatment with clonidine, baclofen, Tylenol. Patient was started on Suboxone by earlier provider.  He is motivated to use Suboxone and follow-up outpatient.  Will have case management give him resources.  DVT prophylaxis: SCDs Code Status: Full code Family Communication: None Disposition Plan: Home.  Pending improvement.  Anticipate tomorrow.   Consultants:   None  Procedures:   None  Antimicrobials:   Vancomycin, 01/08/2019---  Rocephin, 01/08/2019--- 01/11/2019    Subjective: Patient seen and examined.  Afebrile overnight.  Right knee swelling is mostly improved.   Objective: Vitals:   01/10/19 1607 01/10/19 2046 01/11/19 0542 01/11/19 1344  BP: 123/72 126/83 106/65 122/78  Pulse: 72 74 (!) 54 72  Resp: 18 16 16 18   Temp: 99.3 F (37.4 C) 99.1 F (37.3 C) 98 F (36.7 C) 99 F (37.2 C)  TempSrc: Oral Oral Oral   SpO2: 99% 100% 100% 100%  Weight:      Height:        Intake/Output Summary (Last 24 hours) at 01/11/2019 1514 Last data filed at 01/11/2019 1300 Gross per 24 hour  Intake 2125.32 ml  Output -  Net 2125.32 ml   Filed Weights   01/07/19 2343  Weight: 83.9 kg    Examination:  General exam: Appears calm and comfortable  Respiratory system: Clear to auscultation. Respiratory effort normal. Cardiovascular system: S1 & S2 heard, RRR. No JVD, murmurs, rubs, gallops or clicks. No pedal edema. Gastrointestinal system: Abdomen is nondistended, soft and nontender. No organomegaly or masses felt. Normal bowel sounds heard. Central nervous system: Alert and oriented. No focal neurological deficits. Extremities: Symmetric 5 x 5 power. Slightly asymmetrical knees, right larger than left.  Patellar tap negative.  No erythema or fluctuation. Skin: No rashes, lesions or ulcers Psychiatry: Judgement and insight appear normal. Mood & affect appropriate.     Data Reviewed: I have personally reviewed following labs and imaging studies  CBC: Recent Labs  Lab 01/08/19 0248 01/08/19 0540 01/10/19 0704  WBC 15.3* 11.9* 12.8*  NEUTROABS 11.4*  --  8.7*  HGB 11.1* 10.6* 11.1*  HCT 34.5* 32.5* 34.2*  MCV 86.9  87.6 87.2  PLT 352 318 363   Basic Metabolic Panel: Recent Labs  Lab 01/08/19 0248 01/08/19 0540 01/09/19 0841 01/10/19 0704 01/11/19 0839  NA 136 138  --  139  --   K 4.0 3.7  --  4.2  --   CL 103 107  --  107  --   CO2 22 24  --  26  --   GLUCOSE 100* 99  --  123*  --   BUN 11 10  --  12  --   CREATININE 0.78 0.74 0.72  0.81 0.94  CALCIUM 9.0 8.5*  --  8.8*  --   MG  --   --   --  1.7  --   PHOS  --   --   --  4.3  --    GFR: Estimated Creatinine Clearance: 118.6 mL/min (by C-G formula based on SCr of 0.94 mg/dL). Liver Function Tests: Recent Labs  Lab 01/08/19 0540  AST 28  ALT 28  ALKPHOS 49  BILITOT 0.4  PROT 6.4*  ALBUMIN 2.9*   No results for input(s): LIPASE, AMYLASE in the last 168 hours. No results for input(s): AMMONIA in the last 168 hours. Coagulation Profile: No results for input(s): INR, PROTIME in the last 168 hours. Cardiac Enzymes: No results for input(s): CKTOTAL, CKMB, CKMBINDEX, TROPONINI in the last 168 hours. BNP (last 3 results) No results for input(s): PROBNP in the last 8760 hours. HbA1C: No results for input(s): HGBA1C in the last 72 hours. CBG: No results for input(s): GLUCAP in the last 168 hours. Lipid Profile: No results for input(s): CHOL, HDL, LDLCALC, TRIG, CHOLHDL, LDLDIRECT in the last 72 hours. Thyroid Function Tests: No results for input(s): TSH, T4TOTAL, FREET4, T3FREE, THYROIDAB in the last 72 hours. Anemia Panel: No results for input(s): VITAMINB12, FOLATE, FERRITIN, TIBC, IRON, RETICCTPCT in the last 72 hours. Sepsis Labs: Recent Labs  Lab 01/08/19 0254  LATICACIDVEN 0.6    Recent Results (from the past 240 hour(s))  Culture, blood (Routine X 2) w Reflex to ID Panel     Status: None (Preliminary result)   Collection Time: 01/08/19  2:49 AM   Specimen: BLOOD  Result Value Ref Range Status   Specimen Description   Final    BLOOD LEFT ANTECUBITAL Performed at [facility], [address] [place], [place] [address]    Special Requests   Final    BOTTLES DRAWN AEROBIC AND ANAEROBIC Blood Culture adequate volume Performed at [facility], [address] [place], [place] [address]    Culture  Setup Time   Final    IN BOTH AEROBIC AND ANAEROBIC BOTTLES GRAM POSITIVE RODS CRITICAL RESULT CALLED TO, READ  BACK BY AND VERIFIED WITH: Hershal CoriaBETH GREENE PHARM @ [id number] ON 01/10/19 BY ROBINSON Z.  Performed at [facility], [address] [place], [place] [address]    Culture GRAM POSITIVE RODS  Final   Report Status PENDING  Incomplete  Culture, blood (Routine X 2) w Reflex to ID Panel     Status: None (Preliminary result)   Collection Time: 01/08/19  4:37 AM   Specimen: BLOOD LEFT FOREARM  Result Value Ref Range Status   Specimen Description   Final    BLOOD LEFT FOREARM Performed at [facility], [address] [place], [place] [address]    Special Requests   Final    BOTTLES DRAWN AEROBIC AND ANAEROBIC Blood Culture adequate volume Performed at  [facility], [address] [place], [place] [address]    Culture  Setup Time Organism ID to follow  Final   Culture   Final    NO GROWTH 3 DAYS Performed at [facility], [address], [place], [place] [address]    Report Status PENDING  Incomplete  Blood Culture ID Panel (Reflexed)     Status: None   Collection Time: 01/08/19  4:37 AM  Result Value Ref Range Status   Enterococcus species NOT DETECTED NOT DETECTED Final   Listeria monocytogenes NOT DETECTED NOT DETECTED Final   Staphylococcus species NOT DETECTED NOT DETECTED Final   Staphylococcus aureus (BCID) NOT DETECTED NOT DETECTED Final   Streptococcus species NOT DETECTED NOT DETECTED Final   Streptococcus agalactiae NOT DETECTED NOT DETECTED Final   Streptococcus pneumoniae NOT DETECTED NOT DETECTED Final   Streptococcus pyogenes NOT DETECTED NOT DETECTED Final   Acinetobacter baumannii NOT DETECTED NOT DETECTED Final   Enterobacteriaceae species NOT DETECTED NOT DETECTED Final   Enterobacter cloacae complex NOT DETECTED NOT DETECTED Final   Escherichia coli NOT DETECTED NOT DETECTED Final   Klebsiella oxytoca NOT DETECTED NOT DETECTED Final   Klebsiella pneumoniae NOT DETECTED NOT DETECTED Final   Proteus species NOT DETECTED NOT  DETECTED Final   Serratia marcescens NOT DETECTED NOT DETECTED Final   Haemophilus influenzae NOT DETECTED NOT DETECTED Final   Neisseria meningitidis NOT DETECTED NOT DETECTED Final   Pseudomonas aeruginosa NOT DETECTED NOT DETECTED Final   Candida albicans NOT DETECTED NOT DETECTED Final   Candida glabrata NOT DETECTED NOT DETECTED Final   Candida krusei NOT DETECTED NOT DETECTED Final   Candida parapsilosis NOT DETECTED NOT DETECTED Final   Candida tropicalis NOT DETECTED NOT DETECTED Final    Comment: Performed at [facility], [address], [place], [place] [address]  SARS CORONAVIRUS 2 (TAT 6-24 HRS) Nasopharyngeal Nasopharyngeal Swab     Status: None   Collection Time: 01/08/19  4:41 AM   Specimen: Nasopharyngeal Swab  Result Value Ref Range Status   SARS Coronavirus 2 NEGATIVE NEGATIVE Final    Comment: (NOTE) SARS-CoV-2 target nucleic acids are NOT DETECTED. The SARS-CoV-2 RNA is generally detectable in upper and lower respiratory specimens during the acute phase of infection. Negative results do not preclude SARS-CoV-2 infection, do not rule out co-infections with other pathogens, and should not be used as the sole basis for treatment or other patient management decisions. Negative results must be combined with clinical observations, patient history, and epidemiological information. The expected result is Negative. Fact Sheet for Patients: HairSlick.no Fact Sheet for Healthcare Providers: quierodirigir.com This test is not yet approved or cleared by the [place] FDA and  has been authorized for detection and/or diagnosis of SARS-CoV-2 by FDA under an Emergency Use Authorization (EUA). This EUA will remain  in effect (meaning this test can be used) for the duration of the COVID-19 declaration under Section 56 4(b)(1) of the Act, 21 U.S.C. section 360bbb-3(b)(1), unless the authorization is terminated or  revoked sooner. Performed at [facility], [address], [place], [place] [address]          Radiology Studies: No results found.      Scheduled Meds: . buprenorphine-naloxone  1 tablet Sublingual BID  . cloNIDine  0.1 mg Oral BH-qamhs   Followed by  . [START ON 01/13/2019] cloNIDine  0.1 mg Oral QAC breakfast   Continuous Infusions: . [START ON 01/12/2019] vancomycin  LOS: 3 days    Time spent: 25 minutes    Barb Merino, MD [facility] Pager [phone]  If 7PM-7AM, please contact night-coverage [email or web address] Password [email or web address] 01/11/2019, 3:14 PM

## 2019-01-12 DIAGNOSIS — B9689 Other specified bacterial agents as the cause of diseases classified elsewhere: Secondary | ICD-10-CM

## 2019-01-12 DIAGNOSIS — Z91030 Bee allergy status: Secondary | ICD-10-CM

## 2019-01-12 DIAGNOSIS — R7881 Bacteremia: Secondary | ICD-10-CM

## 2019-01-12 DIAGNOSIS — M109 Gout, unspecified: Secondary | ICD-10-CM

## 2019-01-12 DIAGNOSIS — Z87891 Personal history of nicotine dependence: Secondary | ICD-10-CM

## 2019-01-12 LAB — CULTURE, BLOOD (ROUTINE X 2): Special Requests: ADEQUATE

## 2019-01-12 NOTE — Progress Notes (Signed)
Patient has discharged to home on 01/12/2019. Discharge instruction including medication and appointment was given to patient. Patient has no question at this time.

## 2019-01-12 NOTE — Discharge Summary (Signed)
Physician Discharge Summary  Grant Tapia UKG:[id number] DOB: [birth year] DOA: 01/08/2019  PCP: Patient, No Pcp Per  Admit date: 01/08/2019 Discharge date: 01/12/2019  Admitted From: Home. Disposition: Home.  Recommendations for Outpatient Follow-up:  1. Follow up with PCP in 1-2 weeks 2. Follow-up with Suboxone clinic tonight or tomorrow morning as you have scheduled.  Home Health: Not applicable Equipment/Devices: Not applicable  Discharge Condition: Stable CODE STATUS: Full code Diet recommendation: Regular diet  Discharge summary:  33 year old male with history of IV drug abuse who came to the emergency room with right knee pain of sudden onset and swelling.  He also had complained of left shoulder pain few days ago that had improved.  No history of trauma.  No fever or chills.  In the emergency room temperature 100.1, blood pressures were stable.  WBC count was 15.3.  Due to patient's history of IV drug use and elevated WBC count with low-grade temperature, he was admitted to the hospital and treated empirically for possible bloodstream infection.  He was treated with broad-spectrum antibiotics, did good clinical recovery, had generalized swelling of the right knee with no effusion or erythema.  1 of the 4 blood culture bottles positive for bacillus species.  BC ID with no organism.  Seen by infectious disease in consultation and they suggested that this is probably contaminant.  Echocardiogram did not show any evidence of endocarditis.  Right knee swelling is probably some polyarthritis, suggested NSAID's.  He is asymptomatic today.  He is going home.  Patient was extensively counseled against IV drug use.  He is motivated and wants to start on Suboxone.  Patient was started on Suboxone in the hospital.  Patient is stated that he is driving straight to the Suboxone clinic today evening for his appointment to start treatment plan, advised to keep of the follow-up.  Since he is  going to Suboxone clinic today, no prescriptions were provided.   Discharge Diagnoses:  Principal Problem:   Sepsis ([place]) Active Problems:   Alcohol abuse   Heroin abuse ([place])   Methamphetamine abuse ([place])   Cocaine abuse [facility])    Discharge Instructions  Discharge Instructions    Diet - low sodium heart healthy   Complete by: As directed    Discharge instructions   Complete by: As directed    Schedule follow up with Suboxone clinic as you planning to do.   Increase activity slowly   Complete by: As directed      Allergies as of 01/12/2019      Reactions   Bee Venom Swelling      Medication List    TAKE these medications   acetaminophen 500 MG tablet Commonly known as: TYLENOL Take 1,000 mg by mouth every 6 (six) hours as needed for mild pain.   diclofenac sodium 1 % Gel Commonly known as: VOLTAREN Apply 4 g topically 4 (four) times daily.   ibuprofen 200 MG tablet Commonly known as: ADVIL Take 600-800 mg by mouth every 6 (six) hours as needed for moderate pain.   lidocaine 5 % Commonly known as: Lidoderm Place 1 patch onto the skin daily. Remove & Discard patch within 12 hours or as directed by MD       Allergies  Allergen Reactions  . Bee Venom Swelling    Consultations:  Infectious disease   Procedures/Studies: Dg Chest Portable 1 View  Result Date: 01/05/2019 CLINICAL DATA:  33 year old male with chest pain. EXAM: PORTABLE CHEST 1 VIEW COMPARISON:  Chest radiograph  dated 05/08/2017 FINDINGS: The heart size and mediastinal contours are within normal limits. Both lungs are clear. The visualized skeletal structures are unremarkable. IMPRESSION: No active disease. Electronically Signed   By: Elgie Collard M.D.   On: 01/05/2019 02:39   Dg Knee Complete 4 Views Right  Result Date: 01/08/2019 CLINICAL DATA:  Initial evaluation for acute knee pain, no injury. EXAM: RIGHT KNEE - COMPLETE 4+ VIEW COMPARISON:  None. FINDINGS: No evidence of fracture,  dislocation, or joint effusion. No evidence of arthropathy or other focal bone abnormality. Soft tissues are unremarkable. IMPRESSION: Negative. Electronically Signed   By: Rise Mu M.D.   On: 01/08/2019 03:33   Dg Hip Unilat With Pelvis Min 4 Views Left  Result Date: 01/05/2019 CLINICAL DATA:  Left hip pain. EXAM: DG HIP (WITH OR WITHOUT PELVIS) 4+V LEFT COMPARISON:  None. FINDINGS: The cortical margins of the bony pelvis and left hip are intact. No fracture. Pubic symphysis and sacroiliac joints are congruent. Sclerotic density in the intertrochanteric left femur is most consistent with bone island. Os acetabulum. No bony destructive change or periosteal reaction. Both femoral heads are well-seated in the respective acetabula. Mild soft tissue edema laterally. IMPRESSION: Soft tissue edema about the lateral hip. No acute osseous abnormalities. Incidental bone island in the proximal femur. Electronically Signed   By: Narda Rutherford M.D.   On: 01/05/2019 03:06      Subjective: Patient seen and examined.  No overnight events.  Eager to go home.  Minimal pain remains on the right knee.  Afebrile. Case discussed with infectious disease before discharge.   Discharge Exam: Vitals:   01/11/19 2055 01/12/19 0603  BP: (!) 150/84 120/76  Pulse: 83 74  Resp: 18 18  Temp: 98.4 F (36.9 C) 98.1 F (36.7 C)  SpO2: 100% 100%   Vitals:   01/11/19 0542 01/11/19 1344 01/11/19 2055 01/12/19 0603  BP: 106/65 122/78 (!) 150/84 120/76  Pulse: (!) 54 72 83 74  Resp: Temp: 98 F (36.7 C) 99 F (37.2 C) 98.4 F (36.9 C) 98.1 F (36.7 C)  TempSrc: Oral  Oral Oral  SpO2: 100% 100% 100% 100%  Weight:      Height:        General: Pt is alert, awake, not in acute distress Cardiovascular: RRR, S1/S2 +, no rubs, no gallops Respiratory: CTA bilaterally, no wheezing, no rhonchi Abdominal: Soft, NT, ND, bowel sounds + Extremities: no edema, no cyanosis, mild asymmetric swelling  of the right knee otherwise no other findings.    The results of significant diagnostics from this hospitalization (including imaging, microbiology, ancillary and laboratory) are listed below for reference.     Microbiology: Recent Results (from the past 240 hour(s))  Culture, blood (Routine X 2) w Reflex to ID Panel     Status: Abnormal   Collection Time: 01/08/19  2:49 AM   Specimen: BLOOD  Result Value Ref Range Status   Specimen Description   Final    BLOOD LEFT ANTECUBITAL Performed at [facility], [address], [place], [place] [address]    Special Requests   Final    BOTTLES DRAWN AEROBIC AND ANAEROBIC Blood Culture adequate volume Performed at [facility], [address], [place], [place] [address]    Culture  Setup Time   Final    IN BOTH AEROBIC AND ANAEROBIC BOTTLES GRAM POSITIVE RODS CRITICAL RESULT CALLED TO, READ BACK BY AND VERIFIED WITH: [facility] @  1610 ON 01/10/19 BY ROBINSON Z.     Culture (A)  Final    BACILLUS SPECIES Standardized susceptibility testing for this organism is not available. Performed at [facility], [address] [place], [place] [address]    Report Status 01/12/2019 FINAL  Final  Culture, blood (Routine X 2) w Reflex to ID Panel     Status: None (Preliminary result)   Collection Time: 01/08/19  4:37 AM   Specimen: BLOOD LEFT FOREARM  Result Value Ref Range Status   Specimen Description   Final    BLOOD LEFT FOREARM Performed at [facility], [address] [place], [place] [address]    Special Requests   Final    BOTTLES DRAWN AEROBIC AND ANAEROBIC Blood Culture adequate volume Performed at [facility], [address], [place], [place] [address]    Culture  Setup Time Organism ID to follow  Final   Culture   Final    CULTURE REINCUBATED FOR BETTER GROWTH Performed at [facility], [address], [place], [place] [address]     Report Status PENDING  Incomplete  Blood Culture ID Panel (Reflexed)     Status: None   Collection Time: 01/08/19  4:37 AM  Result Value Ref Range Status   Enterococcus species NOT DETECTED NOT DETECTED Final   Listeria monocytogenes NOT DETECTED NOT DETECTED Final   Staphylococcus species NOT DETECTED NOT DETECTED Final   Staphylococcus aureus (BCID) NOT DETECTED NOT DETECTED Final   Streptococcus species NOT DETECTED NOT DETECTED Final   Streptococcus agalactiae NOT DETECTED NOT DETECTED Final   Streptococcus pneumoniae NOT DETECTED NOT DETECTED Final   Streptococcus pyogenes NOT DETECTED NOT DETECTED Final   Acinetobacter baumannii NOT DETECTED NOT DETECTED Final   Enterobacteriaceae species NOT DETECTED NOT DETECTED Final   Enterobacter cloacae complex NOT DETECTED NOT DETECTED Final   Escherichia coli NOT DETECTED NOT DETECTED Final   Klebsiella oxytoca NOT DETECTED NOT DETECTED Final   Klebsiella pneumoniae NOT DETECTED NOT DETECTED Final   Proteus species NOT DETECTED NOT DETECTED Final   Serratia marcescens NOT DETECTED NOT DETECTED Final   Haemophilus influenzae NOT DETECTED NOT DETECTED Final   Neisseria meningitidis NOT DETECTED NOT DETECTED Final   Pseudomonas aeruginosa NOT DETECTED NOT DETECTED Final   Candida albicans NOT DETECTED NOT DETECTED Final   Candida glabrata NOT DETECTED NOT DETECTED Final   Candida krusei NOT DETECTED NOT DETECTED Final   Candida parapsilosis NOT DETECTED NOT DETECTED Final   Candida tropicalis NOT DETECTED NOT DETECTED Final    Comment: Performed at [facility], [address], [place], [place] [address]  SARS CORONAVIRUS 2 (TAT 6-24 HRS) Nasopharyngeal Nasopharyngeal Swab     Status: None   Collection Time: 01/08/19  4:41 AM   Specimen: Nasopharyngeal Swab  Result Value Ref Range Status   SARS Coronavirus 2 NEGATIVE NEGATIVE Final    Comment: (NOTE) SARS-CoV-2 target nucleic acids are NOT DETECTED. The SARS-CoV-2 RNA is generally  detectable in upper and lower respiratory specimens during the acute phase of infection. Negative results do not preclude SARS-CoV-2 infection, do not rule out co-infections with other pathogens, and should not be used as the sole basis for treatment or other patient management decisions. Negative results must be combined with clinical observations, patient history, and epidemiological information. The expected result is Negative. Fact Sheet for Patients: HairSlick.no Fact Sheet for Healthcare Providers: quierodirigir.com This test is not yet approved or cleared  by the [place] and  has been authorized for detection and/or diagnosis of SARS-CoV-2 by FDA under an Emergency Use Authorization (EUA). This EUA will remain  in effect (meaning this test can be used) for the duration of the COVID-19 declaration under Section 56 4(b)(1) of the Act, 21 U.S.C. section 360bbb-3(b)(1), unless the authorization is terminated or revoked sooner. Performed at [facility], [address] [place], [place] [address]   Culture, blood (routine x 2)     Status: None (Preliminary result)   Collection Time: 01/11/19  3:47 PM   Specimen: BLOOD  Result Value Ref Range Status   Specimen Description   Final    BLOOD RIGHT ANTECUBITAL Performed at [facility], [address], [place], [place] [address]    Special Requests   Final    BOTTLES DRAWN AEROBIC AND ANAEROBIC Blood Culture adequate volume Performed at [facility], [address], [place], [place] [address]    Culture   Final    NO GROWTH < 24 HOURS Performed at [facility], [address], [place], [place] [address]    Report Status PENDING  Incomplete  Culture, blood (routine x 2)     Status: None (Preliminary result)   Collection Time: 01/11/19  3:52 PM   Specimen: BLOOD LEFT ARM  Result Value Ref Range Status   Specimen  Description   Final    BLOOD LEFT ARM Performed at [facility], [address], [place], [place] [address]    Special Requests   Final    BOTTLES DRAWN AEROBIC AND ANAEROBIC Blood Culture adequate volume Performed at [facility], [address], [place], [place] [address]    Culture   Final    NO GROWTH < 24 HOURS Performed at [facility], [address], [place], [place] [address]    Report Status PENDING  Incomplete     Labs: BNP (last 3 results) No results for input(s): BNP in the last 8760 hours. Basic Metabolic Panel: Recent Labs  Lab 01/08/19 0248 01/08/19 0540 01/09/19 0841 01/10/19 0704 01/11/19 0839  NA 136 138  --  139  --   K 4.0 3.7  --  4.2  --   CL 103 107  --  107  --   CO2 22 24  --  26  --   GLUCOSE 100* 99  --  123*  --   BUN 11 10  --  12  --   CREATININE 0.78 0.74 0.72 0.81 0.94  CALCIUM 9.0 8.5*  --  8.8*  --   MG  --   --   --  1.7  --   PHOS  --   --   --  4.3  --    Liver Function Tests: Recent Labs  Lab 01/08/19 0540  AST 28  ALT 28  ALKPHOS 49  BILITOT 0.4  PROT 6.4*  ALBUMIN 2.9*   No results for input(s): LIPASE, AMYLASE in the last 168 hours. No results for input(s): AMMONIA in the last 168 hours. CBC: Recent Labs  Lab 01/08/19 0248 01/08/19 0540 01/10/19 0704  WBC 15.3* 11.9* 12.8*  NEUTROABS 11.4*  --  8.7*  HGB 11.1* 10.6* 11.1*  HCT 34.5* 32.5* 34.2*  MCV 86.9 87.6 87.2  PLT 352 318 363   Cardiac Enzymes: No results for input(s): CKTOTAL, CKMB, CKMBINDEX, TROPONINI in the last 168 hours. BNP: Invalid input(s): POCBNP  CBG: No results for input(s): GLUCAP in the last 168 hours. D-Dimer No results for input(s): DDIMER in the last 72 hours. Hgb A1c No results for input(s): HGBA1C in the last 72 hours. Lipid Profile No results for input(s): CHOL, HDL, LDLCALC, TRIG, CHOLHDL, LDLDIRECT in the last 72 hours. Thyroid function studies No results for input(s): TSH,  T4TOTAL, T3FREE, THYROIDAB in the last 72 hours.  Invalid input(s): FREET3 Anemia work up No results for input(s): VITAMINB12, FOLATE, FERRITIN, TIBC, IRON, RETICCTPCT in the last 72 hours. Urinalysis    Component Value Date/Time   COLORURINE YELLOW 12/30/2016 0215   APPEARANCEUR CLEAR 12/30/2016 0215   LABSPEC 1.021 12/30/2016 0215   PHURINE 5.0 12/30/2016 0215   GLUCOSEU NEGATIVE 12/30/2016 0215   HGBUR NEGATIVE 12/30/2016 0215   BILIRUBINUR NEGATIVE 12/30/2016 0215   KETONESUR NEGATIVE 12/30/2016 0215   PROTEINUR NEGATIVE 12/30/2016 0215   UROBILINOGEN 0.2 03/26/2014 2255   NITRITE NEGATIVE 12/30/2016 0215   LEUKOCYTESUR NEGATIVE 12/30/2016 0215   Sepsis Labs Invalid input(s): PROCALCITONIN,  WBC,  LACTICIDVEN Microbiology Recent Results (from the past 240 hour(s))  Culture, blood (Routine X 2) w Reflex to ID Panel     Status: Abnormal   Collection Time: 01/08/19  2:49 AM   Specimen: BLOOD  Result Value Ref Range Status   Specimen Description   Final    BLOOD LEFT ANTECUBITAL Performed at [facility], [address] [place], [place] [address]    Special Requests   Final    BOTTLES DRAWN AEROBIC AND ANAEROBIC Blood Culture adequate volume Performed at [facility], [address] [place], [place] [address]    Culture  Setup Time   Final    IN BOTH AEROBIC AND ANAEROBIC BOTTLES GRAM POSITIVE RODS CRITICAL RESULT CALLED TO, READ BACK BY AND VERIFIED WITH: [facility] @ [id number] ON 01/10/19 BY ROBINSON Z.     Culture (A)  Final    BACILLUS SPECIES Standardized susceptibility testing for this organism is not available. Performed at [facility], [address] [place], [place] [address]    Report Status 01/12/2019 FINAL  Final  Culture, blood (Routine X 2) w Reflex to ID Panel     Status: None (Preliminary result)   Collection Time: 01/08/19  4:37 AM   Specimen: BLOOD LEFT FOREARM  Result Value Ref Range Status    Specimen Description   Final    BLOOD LEFT FOREARM Performed at [facility], [address] [place], [place] [address]    Special Requests   Final    BOTTLES DRAWN AEROBIC AND ANAEROBIC Blood Culture adequate volume Performed at [facility], [address] [place], [place] [address]    Culture  Setup Time Organism ID to follow  Final   Culture   Final    CULTURE REINCUBATED FOR BETTER GROWTH Performed at [facility], [address] [place], [place] [address]    Report Status PENDING  Incomplete  Blood Culture ID Panel (Reflexed)     Status: None   Collection Time: 01/08/19  4:37 AM  Result Value Ref Range Status   Enterococcus species NOT DETECTED NOT DETECTED Final   Listeria monocytogenes NOT DETECTED NOT DETECTED Final   Staphylococcus species NOT DETECTED NOT DETECTED Final   Staphylococcus aureus (BCID) NOT DETECTED NOT DETECTED Final   Streptococcus species NOT DETECTED NOT DETECTED Final   Streptococcus agalactiae NOT DETECTED NOT DETECTED Final   Streptococcus pneumoniae  NOT DETECTED NOT DETECTED Final   Streptococcus pyogenes NOT DETECTED NOT DETECTED Final   Acinetobacter baumannii NOT DETECTED NOT DETECTED Final   Enterobacteriaceae species NOT DETECTED NOT DETECTED Final   Enterobacter cloacae complex NOT DETECTED NOT DETECTED Final   Escherichia coli NOT DETECTED NOT DETECTED Final   Klebsiella oxytoca NOT DETECTED NOT DETECTED Final   Klebsiella pneumoniae NOT DETECTED NOT DETECTED Final   Proteus species NOT DETECTED NOT DETECTED Final   Serratia marcescens NOT DETECTED NOT DETECTED Final   Haemophilus influenzae NOT DETECTED NOT DETECTED Final   Neisseria meningitidis NOT DETECTED NOT DETECTED Final   Pseudomonas aeruginosa NOT DETECTED NOT DETECTED Final   Candida albicans NOT DETECTED NOT DETECTED Final   Candida glabrata NOT DETECTED NOT DETECTED Final   Candida krusei NOT DETECTED NOT DETECTED Final   Candida  parapsilosis NOT DETECTED NOT DETECTED Final   Candida tropicalis NOT DETECTED NOT DETECTED Final    Comment: Performed at [facility], [address], [place], [place] [address]  SARS CORONAVIRUS 2 (TAT 6-24 HRS) Nasopharyngeal Nasopharyngeal Swab     Status: None   Collection Time: 01/08/19  4:41 AM   Specimen: Nasopharyngeal Swab  Result Value Ref Range Status   SARS Coronavirus 2 NEGATIVE NEGATIVE Final    Comment: (NOTE) SARS-CoV-2 target nucleic acids are NOT DETECTED. The SARS-CoV-2 RNA is generally detectable in upper and lower respiratory specimens during the acute phase of infection. Negative results do not preclude SARS-CoV-2 infection, do not rule out co-infections with other pathogens, and should not be used as the sole basis for treatment or other patient management decisions. Negative results must be combined with clinical observations, patient history, and epidemiological information. The expected result is Negative. Fact Sheet for Patients: HairSlick.no Fact Sheet for Healthcare Providers: quierodirigir.com This test is not yet approved or cleared by the [place] FDA and  has been authorized for detection and/or diagnosis of SARS-CoV-2 by FDA under an Emergency Use Authorization (EUA). This EUA will remain  in effect (meaning this test can be used) for the duration of the COVID-19 declaration under Section 56 4(b)(1) of the Act, 21 U.S.C. section 360bbb-3(b)(1), unless the authorization is terminated or revoked sooner. Performed at [facility], [address] [place], [place] [address]   Culture, blood (routine x 2)     Status: None (Preliminary result)   Collection Time: 01/11/19  3:47 PM   Specimen: BLOOD  Result Value Ref Range Status   Specimen Description   Final    BLOOD RIGHT ANTECUBITAL Performed at [facility], [address] [place], [place] [address]     Special Requests   Final    BOTTLES DRAWN AEROBIC AND ANAEROBIC Blood Culture adequate volume Performed at [facility], [address], [place], [place] [address]    Culture   Final    NO GROWTH < 24 HOURS Performed at [facility], [address] [place], [place] [address]    Report Status PENDING  Incomplete  Culture, blood (routine x 2)     Status: None (Preliminary result)   Collection Time: 01/11/19  3:52 PM   Specimen: BLOOD LEFT ARM  Result Value Ref Range Status   Specimen Description   Final    BLOOD LEFT ARM Performed at [facility], [address] [place], [place] [address]    Special Requests   Final    BOTTLES DRAWN AEROBIC AND ANAEROBIC Blood  Culture adequate volume Performed at [facility], [address], [place], [place] [address]    Culture   Final    NO GROWTH < 24 HOURS Performed at [facility], [address] [place], [place] [address]    Report Status PENDING  Incomplete     Time coordinating discharge: 32 minutes.  SIGNED:   Dorcas Carrow, MD  [facility] 01/12/2019, 12:31 PM

## 2019-01-12 NOTE — Consult Note (Signed)
[facility] for Infectious Disease       Reason for Consult: Bacillus in blood    Referring Physician: Dr. Raelyn Mora  Principal Problem:   Sepsis [facility]) Active Problems:   Alcohol abuse   Heroin abuse ([place])   Methamphetamine abuse ([place])   Cocaine abuse ([place])   . buprenorphine-naloxone  1 tablet Sublingual BID  . [START ON 01/13/2019] cloNIDine  0.1 mg Oral QAC breakfast    Recommendations: NSAIDS for probable gout flares Dietary recommendations given for gout No antibiotics indicated  If he is not discharged today, can check a hepatitis C RNA since he has a positive Ab previously; otherwise should get in to primary care and can check it then HIV negative  Assessment: He has multiple joints swollen including his left hip, clavicular area, right knee, all improving.  No signs of infection clinically and Bacillus in blood most c/w a contaminate.   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  He does report a history of gout-like symptoms in his toes.  I suspect his swollen knee developed a Baker's cyst with migration to his calf.  Overall though it has improved.  Doubt he had initial sepsis with a near normal WBC, no fever, normal lactate.  Amphetamines in his system certainly could lead to tachycardia, hypertension.    Antibiotics: Vancomycin day 5  HPI: Grant Tapia is a 33 y.o. male with substance abuse history and has had issues with joint pain including his hip, clavicular area and right knee came in mainly for the right knee swelling.  Started on empiric antibiotics with concern for septic arthritis though xray did not find any significant effusion. Knee has improved as has his hip and chest/clavicular area.  He has had issues with joint swelling in his toes before that are painful and very sensitive to touch.     Review of Systems:  Constitutional: negative for fevers, chills and anorexia Gastrointestinal: negative for nausea and  diarrhea Integument/breast: negative for rash Neurological: negative for headaches and dizziness Behavioral/Psych: negative for good mood All other systems reviewed and are negative    Past Medical History:  Diagnosis Date  . Heroin abuse ([place])   . Hypertension   . IV drug user   . Overdose   . Polysubstance abuse ([place])     Social History   Tobacco Use  . Smoking status: Former Smoker    Quit date: 12/12/2017    Years since quitting: 1.0  . Smokeless tobacco: Never Used  Substance Use Topics  . Alcohol use: Yes    Comment: occasional wine  . Drug use: Yes    Types: IV, Cocaine    Comment: heroin    [place]: maternal GM with gout  Allergies  Allergen Reactions  . Bee Venom Swelling    Physical Exam: Constitutional: in no apparent distress  Vitals:   01/11/19 2055 01/12/19 0603  BP: (!) 150/84 120/76  Pulse: 83 74  Resp: 18 18  Temp: 98.4 F (36.9 C) 98.1 F (36.7 C)  SpO2: 100% 100%   EYES: anicteric ENMT: no thrush Cardiovascular: Cor RRR Respiratory: CTA B; normal respiratory effort GI: Bowel sounds are normal, liver is not enlarged, spleen is not enlarged Musculoskeletal: no pedal edema noted; right knee with minimal swelling, some mild warmth compared to left, some tenderness over left sternoclavicular joint Skin: negatives: no rash Hematologic: no cervical lad  Lab Results  Component Value Date   WBC 12.8 (H)  01/10/2019   HGB 11.1 (L) 01/10/2019   HCT 34.2 (L) 01/10/2019   MCV 87.2 01/10/2019   PLT 363 01/10/2019    Lab Results  Component Value Date   CREATININE 0.94 01/11/2019   BUN 12 01/10/2019   NA 139 01/10/2019   K 4.2 01/10/2019   CL 107 01/10/2019   CO2 26 01/10/2019    Lab Results  Component Value Date   ALT 28 01/08/2019   AST 28 01/08/2019   ALKPHOS 49 01/08/2019     Microbiology: Recent Results (from the past 240 hour(s))  Culture, blood (Routine X 2) w Reflex to ID Panel     Status: Abnormal   Collection Time: 01/08/19   2:49 AM   Specimen: BLOOD  Result Value Ref Range Status   Specimen Description   Final    BLOOD LEFT ANTECUBITAL Performed at [facility], [address], [place], [place] [address]    Special Requests   Final    BOTTLES DRAWN AEROBIC AND ANAEROBIC Blood Culture adequate volume Performed at [facility], [address], [place], [place] [address]    Culture  Setup Time   Final    IN BOTH AEROBIC AND ANAEROBIC BOTTLES GRAM POSITIVE RODS CRITICAL RESULT CALLED TO, READ BACK BY AND VERIFIED WITH: Hershal Coria PHARM @ 1093 ON 01/10/19 BY ROBINSON Z.     Culture (A)  Final    BACILLUS SPECIES Standardized susceptibility testing for this organism is not available. Performed at [facility], [address] [place], [place] [address]    Report Status 01/12/2019 FINAL  Final  Culture, blood (Routine X 2) w Reflex to ID Panel     Status: None (Preliminary result)   Collection Time: 01/08/19  4:37 AM   Specimen: BLOOD LEFT FOREARM  Result Value Ref Range Status   Specimen Description   Final    BLOOD LEFT FOREARM Performed at [facility], [address], [place], [place] [address]    Special Requests   Final    BOTTLES DRAWN AEROBIC AND ANAEROBIC Blood Culture adequate volume Performed at [facility], [address] [place], [place] [address]    Culture  Setup Time Organism ID to follow  Final   Culture   Final    CULTURE REINCUBATED FOR BETTER GROWTH Performed at [facility], [address], [place], [place] [address]    Report Status PENDING  Incomplete  Blood Culture ID Panel (Reflexed)     Status: None   Collection Time: 01/08/19  4:37 AM  Result Value Ref Range Status   Enterococcus species NOT DETECTED NOT DETECTED Final   Listeria monocytogenes NOT DETECTED NOT DETECTED Final   Staphylococcus species NOT DETECTED NOT DETECTED Final   Staphylococcus aureus (BCID) NOT DETECTED NOT  DETECTED Final   Streptococcus species NOT DETECTED NOT DETECTED Final   Streptococcus agalactiae NOT DETECTED NOT DETECTED Final   Streptococcus pneumoniae NOT DETECTED NOT DETECTED Final   Streptococcus pyogenes NOT DETECTED NOT DETECTED Final   Acinetobacter baumannii NOT DETECTED NOT DETECTED Final   Enterobacteriaceae species NOT DETECTED NOT DETECTED Final   Enterobacter cloacae complex NOT DETECTED NOT DETECTED Final   Escherichia coli NOT DETECTED NOT DETECTED Final   Klebsiella oxytoca NOT DETECTED NOT DETECTED Final   Klebsiella pneumoniae NOT DETECTED NOT DETECTED Final   Proteus species NOT DETECTED NOT DETECTED Final   Serratia marcescens NOT DETECTED NOT DETECTED Final  Haemophilus influenzae NOT DETECTED NOT DETECTED Final   Neisseria meningitidis NOT DETECTED NOT DETECTED Final   Pseudomonas aeruginosa NOT DETECTED NOT DETECTED Final   Candida albicans NOT DETECTED NOT DETECTED Final   Candida glabrata NOT DETECTED NOT DETECTED Final   Candida krusei NOT DETECTED NOT DETECTED Final   Candida parapsilosis NOT DETECTED NOT DETECTED Final   Candida tropicalis NOT DETECTED NOT DETECTED Final    Comment: Performed at [facility], [address] [place], [place] [address]  SARS CORONAVIRUS 2 (TAT 6-24 HRS) Nasopharyngeal Nasopharyngeal Swab     Status: None   Collection Time: 01/08/19  4:41 AM   Specimen: Nasopharyngeal Swab  Result Value Ref Range Status   SARS Coronavirus 2 NEGATIVE NEGATIVE Final    Comment: (NOTE) SARS-CoV-2 target nucleic acids are NOT DETECTED. The SARS-CoV-2 RNA is generally detectable in upper and lower respiratory specimens during the acute phase of infection. Negative results do not preclude SARS-CoV-2 infection, do not rule out co-infections with other pathogens, and should not be used as the sole basis for treatment or other patient management decisions. Negative results must be combined with clinical observations, patient history,  and epidemiological information. The expected result is Negative. Fact Sheet for Patients: HairSlick.nohttps://www.fda.gov/media/138098/download Fact Sheet for Healthcare Providers: quierodirigir.comhttps://www.fda.gov/media/138095/download This test is not yet approved or cleared by the [place] FDA and  has been authorized for detection and/or diagnosis of SARS-CoV-2 by FDA under an Emergency Use Authorization (EUA). This EUA will remain  in effect (meaning this test can be used) for the duration of the COVID-19 declaration under Section 56 4(b)(1) of the Act, 21 U.S.C. section 360bbb-3(b)(1), unless the authorization is terminated or revoked sooner. Performed at [facility], [address] [place], [place] [address]   Culture, blood (routine x 2)     Status: None (Preliminary result)   Collection Time: 01/11/19  3:47 PM   Specimen: BLOOD  Result Value Ref Range Status   Specimen Description   Final    BLOOD RIGHT ANTECUBITAL Performed at [facility], [address] [place], [place] [address]    Special Requests   Final    BOTTLES DRAWN AEROBIC AND ANAEROBIC Blood Culture adequate volume Performed at [facility], [address] [place], [place] [address]    Culture   Final    NO GROWTH < 24 HOURS Performed at [facility], [address] [place], [place] [address]    Report Status PENDING  Incomplete  Culture, blood (routine x 2)     Status: None (Preliminary result)   Collection Time: 01/11/19  3:52 PM   Specimen: BLOOD LEFT ARM  Result Value Ref Range Status   Specimen Description   Final    BLOOD LEFT ARM Performed at [facility], [address] [place], [place] [address]    Special Requests   Final    BOTTLES DRAWN AEROBIC AND ANAEROBIC Blood Culture adequate volume Performed at [facility], [address] [place], [place] [address]    Culture   Final    NO GROWTH < 24 HOURS Performed at  [facility], [address] [place], [place] [address]    Report Status PENDING  Incomplete    Gardiner Barefootobert W Mariapaula Krist, MD [facility] [email or web address] 01/12/2019, 10:57 AM

## 2019-01-13 LAB — CULTURE, BLOOD (ROUTINE X 2): Special Requests: ADEQUATE

## 2019-01-16 LAB — CULTURE, BLOOD (ROUTINE X 2)
Culture: NO GROWTH
Culture: NO GROWTH
Special Requests: ADEQUATE
Special Requests: ADEQUATE

## 2019-02-18 ENCOUNTER — Other Ambulatory Visit: Payer: Self-pay

## 2019-02-18 ENCOUNTER — Encounter (HOSPITAL_COMMUNITY): Payer: Self-pay

## 2019-02-18 ENCOUNTER — Emergency Department (HOSPITAL_COMMUNITY)
Admission: EM | Admit: 2019-02-18 | Discharge: 2019-02-19 | Disposition: A | Discharge status: Home / Self Care | Payer: Self-pay | Attending: Emergency Medicine | Admitting: Emergency Medicine

## 2019-02-18 DIAGNOSIS — F1910 Other psychoactive substance abuse, uncomplicated: Secondary | ICD-10-CM | POA: Insufficient documentation

## 2019-02-18 DIAGNOSIS — T401X1A Poisoning by heroin, accidental (unintentional), initial encounter: Secondary | ICD-10-CM | POA: Insufficient documentation

## 2019-02-18 DIAGNOSIS — Z87891 Personal history of nicotine dependence: Secondary | ICD-10-CM | POA: Insufficient documentation

## 2019-02-18 DIAGNOSIS — I10 Essential (primary) hypertension: Secondary | ICD-10-CM | POA: Insufficient documentation

## 2019-02-18 DIAGNOSIS — R4182 Altered mental status, unspecified: Secondary | ICD-10-CM | POA: Insufficient documentation

## 2019-02-18 DIAGNOSIS — F1410 Cocaine abuse, uncomplicated: Secondary | ICD-10-CM | POA: Insufficient documentation

## 2019-02-18 LAB — CBC WITH DIFFERENTIAL/PLATELET
Abs Immature Granulocytes: 0.04 10*3/uL (ref 0.00–0.07)
Basophils Absolute: 0.1 10*3/uL (ref 0.0–0.1)
Basophils Relative: 1 %
Eosinophils Absolute: 0.4 10*3/uL (ref 0.0–0.5)
Eosinophils Relative: 4 %
HCT: 32.4 % — ABNORMAL LOW (ref 39.0–52.0)
Hemoglobin: 10.5 g/dL — ABNORMAL LOW (ref 13.0–17.0)
Immature Granulocytes: 0 %
Lymphocytes Relative: 12 %
Lymphs Abs: 1.5 10*3/uL (ref 0.7–4.0)
MCH: 28.7 pg (ref 26.0–34.0)
MCHC: 32.4 g/dL (ref 30.0–36.0)
MCV: 88.5 fL (ref 80.0–100.0)
Monocytes Absolute: 0.9 10*3/uL (ref 0.1–1.0)
Monocytes Relative: 8 %
Neutro Abs: 9.5 10*3/uL — ABNORMAL HIGH (ref 1.7–7.7)
Neutrophils Relative %: 75 %
Platelets: 430 10*3/uL — ABNORMAL HIGH (ref 150–400)
RBC: 3.66 MIL/uL — ABNORMAL LOW (ref 4.22–5.81)
RDW: 17.2 % — ABNORMAL HIGH (ref 11.5–15.5)
WBC: 12.5 10*3/uL — ABNORMAL HIGH (ref 4.0–10.5)
nRBC: 0 % (ref 0.0–0.2)

## 2019-02-18 LAB — COMPREHENSIVE METABOLIC PANEL
ALT: 42 U/L (ref 0–44)
AST: 46 U/L — ABNORMAL HIGH (ref 15–41)
Albumin: 3.3 g/dL — ABNORMAL LOW (ref 3.5–5.0)
Alkaline Phosphatase: 79 U/L (ref 38–126)
Anion gap: 11 (ref 5–15)
BUN: 14 mg/dL (ref 6–20)
CO2: 22 mmol/L (ref 22–32)
Calcium: 9 mg/dL (ref 8.9–10.3)
Chloride: 104 mmol/L (ref 98–111)
Creatinine, Ser: 0.94 mg/dL (ref 0.61–1.24)
GFR calc Af Amer: >60 mL/min (ref >60)
GFR calc non Af Amer: >60 mL/min (ref >60)
Glucose, Bld: 91 mg/dL (ref 70–99)
Potassium: 4.3 mmol/L (ref 3.5–5.1)
Sodium: 137 mmol/L (ref 135–145)
Total Bilirubin: 1.7 mg/dL — ABNORMAL HIGH (ref 0.3–1.2)
Total Protein: 7.9 g/dL (ref 6.5–8.1)

## 2019-02-18 LAB — ACETAMINOPHEN LEVEL: Acetaminophen (Tylenol), Serum: <10 ug/mL — ABNORMAL LOW (ref 10–30)

## 2019-02-18 LAB — SALICYLATE LEVEL: Salicylate Lvl: <7 mg/dL (ref 2.8–30.0)

## 2019-02-18 MED ORDER — LORAZEPAM 2 MG/ML IJ SOLN
2.0000 mg | Freq: Once | INTRAMUSCULAR | Status: AC
  Administered 2019-02-18: 2 mg via INTRAVENOUS
  Filled 2019-02-18: qty 1

## 2019-02-18 MED ORDER — SODIUM CHLORIDE 0.9 % IV BOLUS
1000.0000 mL | Freq: Once | INTRAVENOUS | Status: AC
  Administered 2019-02-18: 1000 mL via INTRAVENOUS

## 2019-02-18 MED ORDER — DIPHENHYDRAMINE HCL 50 MG/ML IJ SOLN
25.0000 mg | Freq: Once | INTRAMUSCULAR | Status: AC
  Administered 2019-02-18: 19:00:00 25 mg via INTRAVENOUS
  Filled 2019-02-18: qty 1

## 2019-02-18 NOTE — ED Notes (Signed)
Paramedics report they were notified by observers, who saw pt. To be "walking in traffic" and acting strangely. They arrives on scene to find pt. Confused and admitting to using heroin today "that had something in it". He was so agitated that paramedics administered 2.5 mg of IV Versed on scene. He arrives in our E.D. drowsy and in no distress. He is able to appropriately answer questions.

## 2019-02-19 LAB — RAPID URINE DRUG SCREEN, HOSP PERFORMED
Amphetamines: POSITIVE — AB
Barbiturates: NOT DETECTED
Benzodiazepines: POSITIVE — AB
Cocaine: POSITIVE — AB
Opiates: POSITIVE — AB
Tetrahydrocannabinol: NOT DETECTED

## 2019-02-19 NOTE — ED Notes (Signed)
Pt asleep not wanting to give blood or walk at this time

## 2019-02-19 NOTE — ED Notes (Signed)
Pt ambulated to the bathroom Pt c/o of feeling "groggy" Pt  has steady gait with no assistance EDP [place] made aware

## 2019-02-19 NOTE — Discharge Instructions (Addendum)
Substance Abuse Treatment Programs ° °Intensive Outpatient Programs °[facility]     °[address]      °[place], [place]                   °[phone]      ° °[facility] °[address] °[place], [place] °[phone] ° °[facility]     °(Inpatient and outpatient)     °[address]           °[phone]   ° °[facility] °336-288-1484 (Suboxone and Methadone) ° °[address]      °[place], [place] [address]      °336-882-2125      ° °[address] °[place], [place] °852-3033 ° °[facility] (Outpatient/Inpatient, Chemical)    °(insurance only) [phone]      °       °[facility] (Groups & Residential) °[place] °336-389-1413 ° °   °[facility]     °[address]     °[place], [place]      °[phone]      ° °[facility] (for caregivers and family) °[address] °[place], [place] °336-299-4655 ° ° ° ° ° °Residential Treatment Programs °[facility]      °[address], [place], [place] [address]  °([phone]      ° °[facility] °[address] [place], [place] [address] °[phone] ° °[facility]        °336-248-8914      ° °[facility] °1-800-659-3381 ° °[facility]             °[address]                                         °[place], [place]                                                °877-615-2722 or 336-784-9470                              ° °[facility] [place] °[address] °[place], [address] °1.877.941.8954 ° °[facility]    °[address]      °[place], [place]     °[phone]      ° °[facility] °[address] °[place], [place] °336-285-9073 ° °[facility]   °[address]     °[place], [place] [address]     °[phone]      °Admissions: 8am-3pm M-F ° °[facility] °[address] °[place] °336-227-7417 ° °[facility]: Residential Program (90 Days)   °[place], [place]      °336-725-8389 or 800-758-6077    ° °[facility] °[place], [place] °(Walk in Hours over the weekend or by referral) ° °[facility] °[address], [place],  [address] °([phone] ° °Crisis Mobile: [facility]:  1-877-626-1772 (for crisis response 24 hours a day) °[facility] Hotline:      1-800-256-2452 °Outpatient Psychiatry and Counseling ° °[facility]: Mobile Crisis   Management 24 hours:  1-877-626-1772 ° °[facility] sliding scale fee and walk in schedule: M-F 8am-12pm/1pm-3pm °[address]  °[place], [place] [address] °[phone] ° °[facility] °[address] °[place], [place] [address] °336-703-9650 ° °[facility] ([facility] new patient walk-in appointments available Monday - Friday 8am -3pm.          °[address] °[place], [place] [address] °336-676-6840 or crisis line- 336-676-6905 ° °[facility] Intensive [facility]patient Therapy Program °[address] °[place], [place] [address] °336-832-9804 ° °[facility]                  °Crisis Services      °[phone]      °[address]     °[place], [place] [address]                ° °[facility]   °[facility] °[phone] °[address] °[place], [place] [address] ° ° °[facility]          °[address]  °[place], [place] [address]       °([phone] ° °[facility] °[address] °[place], [place] [address] °336-292-1510 ° °[facility]    °[address]    °[place], [place] [address]     °[phone]      ° °Parish McKinney, MD     °[address]     °[place] [address]     °336-282-1251     °  °[facility] °[address] °[place] [address] ° °[facility]     °[address]     °[place], [place]      °336-542-2076      ° °[facility] °Shamsher Ahluwalia, MD °[address] °[place], [place] [address] °[phone] ° °[facility]     °[address]     °[place], [place] [address]     °336-274-1237      ° °[facility] °[address] °[place], [place] [address] °336-854-4450 °Resources for Temporary Residential Assistance/Crisis Centers ° °DAY CENTERS °[facility] °M-F 8am-3pm   °[address] [place], [place] [address]   336-332-0824 °Services include: laundry, barbering, support groups, case management, phone  & computer access, showers, AA/NA mtgs, mental health/substance abuse nurse, job skills class, disability information, VA assistance, spiritual classes, etc.  ° °HOMELESS SHELTERS ° °[facility]     °[facility]   °[address], [place]     °336.271.5959       °       °[facility] (women and children)       °[address] °[place], [place] [address] °336-275-0820 °[email or web address] for application and process °Application Required ° °[facility]   °[address]    °[place] [address]     °[phone]       °             °[facility] °[address] °[place],  [address] °336.273.5572 °336-235-0363(schedule application appt.) °Application Required ° °[facility] (women only)    °[address]     °[place],  [address]     °336-884-1039      °  Intake starts 6pm daily °Need valid ID, SSC, & Police report °[facility] [place] °[address] °[place], [place] °336-881-5420 °Application Required ° °[facility] (men only)     °[address]      °[place], [place]     °[phone]      ° °[facility] °(Pregnant women only) °[address] °[place], [place] °336-275-0206 ° °[facility]      °[address]      °[place], [place] [address]     °336-722-9951      °       °[facility] °[address] °[place], [place] °336-723-1848 °90 day commitment/SA/Application process ° °[facility]     °[address]     °[place], [place]     °[phone]       °Check-in at 7pm     °       °[facility] °[address] °[place], [place] [address] °336-248-6684 °Men/Women/Women and Children must be there by 7 pm ° °[facility] °[place], [place] °336-722-8721                ° °

## 2019-02-19 NOTE — ED Provider Notes (Signed)
Patient rested all night.  He is feeling improved.  Is able to ambulate.  He suspects that his heroin was laced with methamphetamine.  His drug screen was positive for amphetamines.  I counseled patient on the need to stop his substance abuse. Pt is alert/appropriate for d/c home    Ripley Fraise, MD 02/19/19 [phone]

## 2019-02-24 NOTE — ED Provider Notes (Signed)
[facility] Note   CSN: [id number] Arrival date & time: 02/18/19  1801     History   Chief Complaint Chief Complaint  Patient presents with  . Drug Overdose    HPI Grant Tapia is a 33 y.o. male.     HPI   33yo male with history of polysubstance abuse, hypertension, admission at the end of September for elevated temperature in setting if IVDU, who presents with concern for agitation and altered mental status after reportedly using IV heroin today.  Reports IVDU today with what he thought was heroin but acknowledges may have something else.  He was acting strangely, wandering into traffic.  Reports he feels strange, denies pain, fever. He is sleepy but able to provide history.  He was very agitated with EMS and received versed. Cannot lay still in the ED.  Denies HI/SI, use of other known drugs.   Past Medical History:  Diagnosis Date  . Heroin abuse (HCC)   . Hypertension   . IV drug user   . Overdose   . Polysubstance abuse [facility])     Patient Active Problem List   Diagnosis Date Noted  . Poisoning by heroin, accidental (unintentional), initial encounter (HCC) 05/08/2017  . Heroin dependence (HCC) 05/08/2017  . Back pain 12/30/2016  . Abnormal LFTs 12/30/2016  . Polysubstance abuse (HCC)   . Cellulitis 11/19/2016  . IV drug user 11/19/2016  . Abscess 11/19/2016  . Delirium   . Tachycardia   . Overdose 07/18/2016  . Drug withdrawal (HCC) 07/16/2016  . AKI (acute kidney injury) (HCC) 07/15/2016  . Alcohol abuse 07/15/2016  . Heroin abuse (HCC) 07/15/2016  . 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 Cocaine abuse (HCC) 07/15/2016  . Acute metabolic encephalopathy 07/15/2016  . Lactic acidosis 03/29/2014  . Sepsis (HCC) 03/27/2014  . Chest pain   . Headache   . Neck pain     Past Surgical History:  Procedure Laterality Date  . I&D EXTREMITY Left 11/19/2016   Procedure: INCISION AND DRAINAGE LEFT ELBOW   ABSCESS;  Surgeon: Myrene Galas, MD;  Location: [facility] OR;  Service: Orthopedics;  Laterality: Left;        Home Medications    Prior to Admission medications   Medication Sig Start Date End Date Taking? Authorizing Provider  diclofenac sodium (VOLTAREN) 1 % GEL Apply 4 g topically 4 (four) times daily. Patient not taking: Reported on 01/08/2019 01/05/19   Palumbo, April, MD  lidocaine (LIDODERM) 5 % Place 1 patch onto the skin daily. Remove & Discard patch within 12 hours or as directed by MD Patient not taking: Reported on 01/08/2019 01/05/19   Nicanor Alcon, April, MD    Family History No family history on file.  Social History Social History   Tobacco Use  . Smoking status: Former Smoker    Quit date: 12/12/2017    Years since quitting: 1.2  . Smokeless tobacco: Never Used  Substance Use Topics  . Alcohol use: Yes    Comment: occasional wine  . Drug use: Yes    Types: IV, Cocaine    Comment: heroin     Allergies   Bee venom   Review of Systems Review of Systems  Unable to perform ROS: Mental status change  Constitutional: Negative for fever.  Respiratory: Negative for shortness of breath.   Cardiovascular: Negative for chest pain.  Gastrointestinal: Negative for abdominal pain and vomiting.  Genitourinary: Negative for difficulty urinating.  Skin: Negative for rash.  Neurological: Negative for syncope and headaches.  Psychiatric/Behavioral: Positive for agitation and behavioral problems. Negative for suicidal ideas. The patient is hyperactive.      Physical Exam Updated Vital Signs BP 122/89   Pulse (!) 105   Temp 99.4 F (37.4 C) (Oral)   Resp 20   SpO2 100%   Physical Exam Vitals signs and nursing note reviewed.  Constitutional:      Appearance: He is well-developed. He is not diaphoretic.     Comments: Sleepy but with akathisia, constantly moving, cannot sit still, agitated, eyes closing, answering questions  HENT:     Head: Normocephalic and atraumatic.   Eyes:     Conjunctiva/sclera: Conjunctivae normal.  Neck:     Musculoskeletal: Normal range of motion.  Cardiovascular:     Rate and Rhythm: Regular rhythm. Tachycardia present.     Heart sounds: Normal heart sounds. No murmur. No friction rub. No gallop.   Pulmonary:     Effort: Pulmonary effort is normal. No respiratory distress.     Breath sounds: Normal breath sounds. No wheezing or rales.  Abdominal:     General: There is no distension.     Palpations: Abdomen is soft.     Tenderness: There is no abdominal tenderness. There is no guarding.  Skin:    General: Skin is warm and dry.  Neurological:     Mental Status: He is oriented to person, place, and time.      ED Treatments / Results  Labs (all labs ordered are listed, but only abnormal results are displayed) Labs Reviewed  CBC WITH DIFFERENTIAL/PLATELET - Abnormal; Notable for the following components:      Result Value   WBC 12.5 (*)    RBC 3.66 (*)    Hemoglobin 10.5 (*)    HCT 32.4 (*)    RDW 17.2 (*)    Platelets 430 (*)    Neutro Abs 9.5 (*)    All other components within normal limits  COMPREHENSIVE METABOLIC PANEL - Abnormal; Notable for the following components:   Albumin 3.3 (*)    AST 46 (*)    Total Bilirubin 1.7 (*)    All other components within normal limits  ACETAMINOPHEN LEVEL - Abnormal; Notable for the following components:   Acetaminophen (Tylenol), Serum <10 (*)    All other components within normal limits  RAPID URINE DRUG SCREEN, HOSP PERFORMED - Abnormal; Notable for the following components:   Opiates POSITIVE (*)    Cocaine POSITIVE (*)    Benzodiazepines POSITIVE (*)    Amphetamines POSITIVE (*)    All other components within normal limits  SALICYLATE LEVEL    EKG EKG Interpretation  Date/Time:  Saturday February 18 2019 18:24:09 EST Ventricular Rate:  154 PR Interval:    QRS Duration: 74 QT Interval:  288 QTC Calculation: 461 R Axis:   31 Text Interpretation: Sinus  tachycardia RSR' in V1 or V2, probably normal variant Borderline repolarization abnormality Since prior ECG< rate has increased Confirmed by Gareth Morgan [phone]) on 02/18/2019 8:39:41 PM   Radiology No results found.  Procedures Procedures (including critical care time)  Medications Ordered in ED Medications  sodium chloride 0.9 % bolus 1,000 mL (0 mLs Intravenous Stopped 02/18/19 2102)  diphenhydrAMINE (BENADRYL) injection 25 mg (25 mg Intravenous Given 02/18/19 1841)  LORazepam (ATIVAN) injection 2 mg (2 mg Intravenous Given 02/18/19 1924)  sodium chloride 0.9 % bolus 1,000 mL (0 mLs Intravenous Stopped 02/19/19 0201)     Initial Impression /  Assessment and Plan / ED Course  I have reviewed the triage vital signs and the nursing notes.  Pertinent labs & imaging results that were available during my care of the patient were reviewed by me and considered in my medical decision making (see chart for details).        33yo male with history of polysubstance abuse, hypertension, admission at the end of September for elevated temperature in setting if IVDU, who presents with concern for agitation and altered mental status after reportedly using IV heroin today. Labs without acute abnormalities. Pt altered but able to provide history, no sign of fever, infection, no hx of SI/HI.  Reports heroin but suspect polysubstance/likely stimulant such as cocaine or methamphetamine.  Given benadryl, ativan, fluids. Improving tachycardia, mental status but not stable for discharge at thsi time. Will continue to monitor until clinical sobriety in the ED. Care signed out to Dr. Bebe ShaggyWickline.  Final Clinical Impressions(s) / ED Diagnoses   Final diagnoses:  Polysubstance abuse [facility](HCC)    ED Discharge Orders    None       Alvira MondaySchlossman, Askari Kinley, MD 02/24/19 1219

## 2019-04-14 ENCOUNTER — Encounter (HOSPITAL_COMMUNITY): Payer: Self-pay

## 2019-04-14 ENCOUNTER — Inpatient Hospital Stay (HOSPITAL_COMMUNITY)
Admission: EM | Admit: 2019-04-14 | Discharge: 2019-04-20 | DRG: 603 | Disposition: A | Discharge status: Home / Self Care | Payer: Medicaid Other | Attending: Internal Medicine | Admitting: Internal Medicine

## 2019-04-14 ENCOUNTER — Other Ambulatory Visit: Payer: Self-pay

## 2019-04-14 DIAGNOSIS — R7989 Other specified abnormal findings of blood chemistry: Secondary | ICD-10-CM | POA: Diagnosis present

## 2019-04-14 DIAGNOSIS — I10 Essential (primary) hypertension: Secondary | ICD-10-CM | POA: Diagnosis present

## 2019-04-14 DIAGNOSIS — R6510 Systemic inflammatory response syndrome (SIRS) of non-infectious origin without acute organ dysfunction: Secondary | ICD-10-CM | POA: Diagnosis present

## 2019-04-14 DIAGNOSIS — F1910 Other psychoactive substance abuse, uncomplicated: Secondary | ICD-10-CM | POA: Diagnosis present

## 2019-04-14 DIAGNOSIS — A419 Sepsis, unspecified organism: Secondary | ICD-10-CM | POA: Diagnosis present

## 2019-04-14 DIAGNOSIS — L03221 Cellulitis of neck: Secondary | ICD-10-CM | POA: Diagnosis present

## 2019-04-14 DIAGNOSIS — L0211 Cutaneous abscess of neck: Principal | ICD-10-CM

## 2019-04-14 DIAGNOSIS — F1110 Opioid abuse, uncomplicated: Secondary | ICD-10-CM | POA: Diagnosis present

## 2019-04-14 DIAGNOSIS — R945 Abnormal results of liver function studies: Secondary | ICD-10-CM | POA: Diagnosis present

## 2019-04-14 DIAGNOSIS — M542 Cervicalgia: Secondary | ICD-10-CM | POA: Diagnosis present

## 2019-04-14 DIAGNOSIS — Z87891 Personal history of nicotine dependence: Secondary | ICD-10-CM

## 2019-04-14 DIAGNOSIS — Z91030 Bee allergy status: Secondary | ICD-10-CM

## 2019-04-14 DIAGNOSIS — Z20822 Contact with and (suspected) exposure to covid-19: Secondary | ICD-10-CM | POA: Diagnosis present

## 2019-04-14 HISTORY — DX: Cutaneous abscess of neck: L02.11

## 2019-04-14 LAB — CBC WITH DIFFERENTIAL/PLATELET
Abs Immature Granulocytes: 0.05 10*3/uL (ref 0.00–0.07)
Basophils Absolute: 0 10*3/uL (ref 0.0–0.1)
Basophils Relative: 0 %
Eosinophils Absolute: 0.2 10*3/uL (ref 0.0–0.5)
Eosinophils Relative: 2 %
HCT: 37.7 % — ABNORMAL LOW (ref 39.0–52.0)
Hemoglobin: 12 g/dL — ABNORMAL LOW (ref 13.0–17.0)
Immature Granulocytes: 0 %
Lymphocytes Relative: 15 %
Lymphs Abs: 1.9 10*3/uL (ref 0.7–4.0)
MCH: 29.3 pg (ref 26.0–34.0)
MCHC: 31.8 g/dL (ref 30.0–36.0)
MCV: 92 fL (ref 80.0–100.0)
Monocytes Absolute: 1.4 10*3/uL — ABNORMAL HIGH (ref 0.1–1.0)
Monocytes Relative: 11 %
Neutro Abs: 9.1 10*3/uL — ABNORMAL HIGH (ref 1.7–7.7)
Neutrophils Relative %: 72 %
Platelets: 228 10*3/uL (ref 150–400)
RBC: 4.1 MIL/uL — ABNORMAL LOW (ref 4.22–5.81)
RDW: 14.2 % (ref 11.5–15.5)
WBC: 12.7 10*3/uL — ABNORMAL HIGH (ref 4.0–10.5)
nRBC: 0 % (ref 0.0–0.2)

## 2019-04-14 LAB — COMPREHENSIVE METABOLIC PANEL
ALT: 66 U/L — ABNORMAL HIGH (ref 0–44)
AST: 64 U/L — ABNORMAL HIGH (ref 15–41)
Albumin: 3.5 g/dL (ref 3.5–5.0)
Alkaline Phosphatase: 49 U/L (ref 38–126)
Anion gap: 10 (ref 5–15)
BUN: 16 mg/dL (ref 6–20)
CO2: 25 mmol/L (ref 22–32)
Calcium: 8.5 mg/dL — ABNORMAL LOW (ref 8.9–10.3)
Chloride: 103 mmol/L (ref 98–111)
Creatinine, Ser: 1.02 mg/dL (ref 0.61–1.24)
GFR calc Af Amer: >60 mL/min (ref >60)
GFR calc non Af Amer: >60 mL/min (ref >60)
Glucose, Bld: 77 mg/dL (ref 70–99)
Potassium: 4.2 mmol/L (ref 3.5–5.1)
Sodium: 138 mmol/L (ref 135–145)
Total Bilirubin: 1.1 mg/dL (ref 0.3–1.2)
Total Protein: 7.1 g/dL (ref 6.5–8.1)

## 2019-04-14 LAB — ETHANOL: Alcohol, Ethyl (B): <10 mg/dL (ref <10)

## 2019-04-14 MED ORDER — SODIUM CHLORIDE 0.9 % IV BOLUS
1000.0000 mL | Freq: Once | INTRAVENOUS | Status: AC
  Administered 2019-04-14: 23:00:00 1000 mL via INTRAVENOUS

## 2019-04-14 NOTE — ED Notes (Signed)
This writer found needle in patients pocket while undressing patient. Needle given to security.

## 2019-04-14 NOTE — ED Notes (Signed)
Pt oxygen did not go above 89-90% on room air. Pt placed on 2L of nasal cannula

## 2019-04-14 NOTE — ED Triage Notes (Addendum)
Pt BIB [facility] from [facility] for a drug OD. They believe it was heroin cut with something. Pt found with manic behavior and dystonic movements. They say that he was never violent or aggressive. Bruising noted under R eye. 2 needles found on patient. Tachycardic. Given 5mg  IM of Versed and a 500 mL bolus NS en route. Pt will nod in response, but is not speaking at this time.

## 2019-04-15 ENCOUNTER — Inpatient Hospital Stay (HOSPITAL_COMMUNITY): Payer: Medicaid Other

## 2019-04-15 ENCOUNTER — Encounter (HOSPITAL_COMMUNITY): Payer: Self-pay

## 2019-04-15 ENCOUNTER — Emergency Department (HOSPITAL_COMMUNITY): Payer: Medicaid Other

## 2019-04-15 DIAGNOSIS — L0211 Cutaneous abscess of neck: Principal | ICD-10-CM

## 2019-04-15 DIAGNOSIS — I10 Essential (primary) hypertension: Secondary | ICD-10-CM | POA: Diagnosis present

## 2019-04-15 DIAGNOSIS — F1910 Other psychoactive substance abuse, uncomplicated: Secondary | ICD-10-CM

## 2019-04-15 DIAGNOSIS — Z87891 Personal history of nicotine dependence: Secondary | ICD-10-CM | POA: Diagnosis not present

## 2019-04-15 DIAGNOSIS — Z20822 Contact with and (suspected) exposure to covid-19: Secondary | ICD-10-CM | POA: Diagnosis present

## 2019-04-15 DIAGNOSIS — I361 Nonrheumatic tricuspid (valve) insufficiency: Secondary | ICD-10-CM

## 2019-04-15 DIAGNOSIS — Z91030 Bee allergy status: Secondary | ICD-10-CM | POA: Diagnosis not present

## 2019-04-15 DIAGNOSIS — R945 Abnormal results of liver function studies: Secondary | ICD-10-CM | POA: Diagnosis present

## 2019-04-15 DIAGNOSIS — R6510 Systemic inflammatory response syndrome (SIRS) of non-infectious origin without acute organ dysfunction: Secondary | ICD-10-CM | POA: Diagnosis present

## 2019-04-15 DIAGNOSIS — F1110 Opioid abuse, uncomplicated: Secondary | ICD-10-CM | POA: Diagnosis present

## 2019-04-15 DIAGNOSIS — M542 Cervicalgia: Secondary | ICD-10-CM

## 2019-04-15 DIAGNOSIS — L03221 Cellulitis of neck: Secondary | ICD-10-CM | POA: Diagnosis present

## 2019-04-15 LAB — PROTIME-INR
INR: 1.3 — ABNORMAL HIGH (ref 0.8–1.2)
Prothrombin Time: 15.6 seconds — ABNORMAL HIGH (ref 11.4–15.2)

## 2019-04-15 LAB — RESPIRATORY PANEL BY RT PCR (FLU A&B, COVID)
Influenza A by PCR: NEGATIVE
Influenza B by PCR: NEGATIVE
SARS Coronavirus 2 by RT PCR: NEGATIVE

## 2019-04-15 LAB — HEPATITIS PANEL, ACUTE
HCV Ab: REACTIVE — AB
Hep A IgM: UNDETERMINED — AB
Hep B C IgM: NONREACTIVE
Hepatitis B Surface Ag: NONREACTIVE

## 2019-04-15 LAB — SARS CORONAVIRUS 2 (TAT 6-24 HRS): SARS Coronavirus 2: NEGATIVE

## 2019-04-15 LAB — ECHOCARDIOGRAM COMPLETE

## 2019-04-15 MED ORDER — POLYETHYLENE GLYCOL 3350 17 G PO PACK: 17.0000 g | PACK | Freq: Every day | ORAL | Status: DC | PRN

## 2019-04-15 MED ORDER — HEPARIN SODIUM (PORCINE) 5000 UNIT/ML IJ SOLN
5000.0000 [IU] | Freq: Three times a day (TID) | INTRAMUSCULAR | Status: DC
  Administered 2019-04-15 – 2019-04-19 (×8): 5000 [IU] via SUBCUTANEOUS
  Filled 2019-04-15 (×9): qty 1

## 2019-04-15 MED ORDER — LORAZEPAM 2 MG/ML IJ SOLN
INTRAMUSCULAR | Status: AC
  Filled 2019-04-15: qty 1

## 2019-04-15 MED ORDER — PIPERACILLIN-TAZOBACTAM 3.375 G IVPB 30 MIN
3.3750 g | Freq: Once | INTRAVENOUS | Status: AC
  Administered 2019-04-15: 3.375 g via INTRAVENOUS
  Filled 2019-04-15: qty 50

## 2019-04-15 MED ORDER — ACETAMINOPHEN 650 MG RE SUPP: 650.0000 mg | Freq: Four times a day (QID) | RECTAL | Status: DC | PRN

## 2019-04-15 MED ORDER — LORAZEPAM 2 MG/ML IJ SOLN
1.0000 mg | INTRAMUSCULAR | Status: AC | PRN
  Administered 2019-04-15 – 2019-04-16 (×2): 2 mg via INTRAVENOUS
  Filled 2019-04-15: qty 1

## 2019-04-15 MED ORDER — DOCUSATE SODIUM 100 MG PO CAPS
100.0000 mg | ORAL_CAPSULE | Freq: Two times a day (BID) | ORAL | Status: DC
  Administered 2019-04-16 – 2019-04-20 (×8): 100 mg via ORAL
  Filled 2019-04-15 (×10): qty 1

## 2019-04-15 MED ORDER — PROMETHAZINE HCL 25 MG PO TABS
12.5000 mg | ORAL_TABLET | Freq: Four times a day (QID) | ORAL | Status: DC | PRN
  Administered 2019-04-15: 09:00:00 12.5 mg via ORAL
  Filled 2019-04-15: qty 1

## 2019-04-15 MED ORDER — IBUPROFEN 600 MG PO TABS
600.0000 mg | ORAL_TABLET | Freq: Four times a day (QID) | ORAL | Status: DC | PRN
  Administered 2019-04-15 – 2019-04-16 (×2): 600 mg via ORAL
  Filled 2019-04-15 (×2): qty 1

## 2019-04-15 MED ORDER — VANCOMYCIN HCL 1500 MG/300ML IV SOLN
1500.0000 mg | Freq: Two times a day (BID) | INTRAVENOUS | Status: DC
  Administered 2019-04-16 – 2019-04-20 (×9): 1500 mg via INTRAVENOUS
  Filled 2019-04-15 (×10): qty 300

## 2019-04-15 MED ORDER — ADULT MULTIVITAMIN W/MINERALS CH
1.0000 | ORAL_TABLET | Freq: Every day | ORAL | Status: DC
  Administered 2019-04-15 – 2019-04-20 (×6): 1 via ORAL
  Filled 2019-04-15 (×6): qty 1

## 2019-04-15 MED ORDER — ACETAMINOPHEN 325 MG PO TABS
650.0000 mg | ORAL_TABLET | Freq: Four times a day (QID) | ORAL | Status: DC | PRN
  Administered 2019-04-15 – 2019-04-17 (×7): 650 mg via ORAL
  Filled 2019-04-15 (×7): qty 2

## 2019-04-15 MED ORDER — THIAMINE HCL 100 MG/ML IJ SOLN
Freq: Once | INTRAVENOUS | Status: AC
  Filled 2019-04-15: qty 1000

## 2019-04-15 MED ORDER — PIPERACILLIN-TAZOBACTAM 3.375 G IVPB
3.3750 g | Freq: Three times a day (TID) | INTRAVENOUS | Status: DC
  Administered 2019-04-16 – 2019-04-18 (×8): 3.375 g via INTRAVENOUS
  Filled 2019-04-15 (×7): qty 50

## 2019-04-15 MED ORDER — SODIUM CHLORIDE 0.9 % IV SOLN: INTRAVENOUS | Status: DC

## 2019-04-15 MED ORDER — LORAZEPAM 2 MG/ML IJ SOLN
0.0000 mg | Freq: Two times a day (BID) | INTRAMUSCULAR | Status: AC
  Administered 2019-04-17 – 2019-04-18 (×2): 1 mg via INTRAVENOUS
  Filled 2019-04-15 (×3): qty 1

## 2019-04-15 MED ORDER — LORAZEPAM 2 MG/ML IJ SOLN
0.0000 mg | Freq: Four times a day (QID) | INTRAMUSCULAR | Status: AC
  Administered 2019-04-15: 10:00:00 2 mg via INTRAVENOUS
  Administered 2019-04-15: 17:00:00 1 mg via INTRAVENOUS
  Filled 2019-04-15 (×2): qty 1

## 2019-04-15 MED ORDER — LORAZEPAM 1 MG PO TABS: 1.0000 mg | ORAL_TABLET | ORAL | Status: AC | PRN

## 2019-04-15 MED ORDER — MORPHINE SULFATE (PF) 4 MG/ML IV SOLN
4.0000 mg | Freq: Once | INTRAVENOUS | Status: AC
  Administered 2019-04-15: 07:00:00 4 mg via INTRAVENOUS
  Filled 2019-04-15: qty 1

## 2019-04-15 MED ORDER — THIAMINE HCL 100 MG PO TABS
100.0000 mg | ORAL_TABLET | Freq: Every day | ORAL | Status: DC
  Administered 2019-04-15 – 2019-04-20 (×6): 100 mg via ORAL
  Filled 2019-04-15 (×6): qty 1

## 2019-04-15 MED ORDER — FOLIC ACID 1 MG PO TABS
1.0000 mg | ORAL_TABLET | Freq: Every day | ORAL | Status: DC
  Administered 2019-04-15 – 2019-04-20 (×6): 1 mg via ORAL
  Filled 2019-04-15 (×6): qty 1

## 2019-04-15 MED ORDER — IOHEXOL 300 MG/ML  SOLN
75.0000 mL | Freq: Once | INTRAMUSCULAR | Status: AC | PRN
  Administered 2019-04-15: 05:00:00 75 mL via INTRAVENOUS

## 2019-04-15 MED ORDER — CHLORDIAZEPOXIDE HCL 5 MG PO CAPS
10.0000 mg | ORAL_CAPSULE | Freq: Three times a day (TID) | ORAL | Status: DC
  Administered 2019-04-15 – 2019-04-20 (×14): 10 mg via ORAL
  Filled 2019-04-15 (×16): qty 2

## 2019-04-15 MED ORDER — VANCOMYCIN HCL IN DEXTROSE 1-5 GM/200ML-% IV SOLN
1000.0000 mg | Freq: Once | INTRAVENOUS | Status: AC
  Administered 2019-04-15: 07:00:00 1000 mg via INTRAVENOUS
  Filled 2019-04-15: qty 200

## 2019-04-15 NOTE — Progress Notes (Signed)
Rapid Response Note  RN called for patient being a red MEWS. HR 134, BP 103.64F, RR 24. RN on phone with attending MD who ordered blood cultures, ibuprofen, maintenance fluids, and librium to be given. Pt admitted for neck abscess. RN does not need rapid response at bedside, but states she wants rapid response to be aware of this patient. Chart reviewed, if fevers persists pt may need lactic acid blood draw. RN to call rapid response for further needs or if new orders are ineffective in treating patient's HR and fever.

## 2019-04-15 NOTE — Progress Notes (Signed)
Pharmacy Antibiotic Note  Grant Tapia is a 34 y.o. male admitted on 04/14/2019 with neck abscess.  Pharmacy has been consulted for Vancomycin/Zosyn dosing. WBC mildly elevated. Renal function good.   Plan: Vancomycin 1500 mg IV q12h >>Estimated AUC: 526 Zosyn 3.375G IV q8h to be infused over 4 hours Trend WBC, temp, renal function  F/U infectious work-up Drug levels as indicated  Temp (24hrs), Avg:100.1 F (37.8 C), Min:98.4 F (36.9 C), Max:103.1 F (39.5 C)  Recent Labs  Lab 04/14/19 2214  WBC 12.7*  CREATININE 1.02    CrCl cannot be calculated (Unknown ideal weight.).    Allergies  Allergen Reactions  . Bee Venom Swelling    Abran Duke, PharmD, BCPS Clinical Pharmacist Phone: [phone]

## 2019-04-15 NOTE — H&P (View-Only) (Signed)
Reason for Consult:left neck infection Referring Physician: er  Grant Tapia is an 34 y.o. male.  HPI: hx of swelling in the left anterior neck /chest. It occurred 1 month ago and spontaneously resolved. He now has pain and swelling again since 12/30. He has some limitation of movement of neck. He is IV drug user but denies and trauma or sticks to the neck. He has no dysphagia or odynophagia. No breathing issues.  Past Medical History:  Diagnosis Date  . Heroin abuse (HCC)   . Hypertension   . IV drug user   . Overdose   . Polysubstance abuse [facility])     Past Surgical History:  Procedure Laterality Date  . I & D EXTREMITY Left 11/19/2016   Procedure: INCISION AND DRAINAGE LEFT ELBOW  ABSCESS;  Surgeon: Myrene Galas, MD;  Location: [facility] OR;  Service: Orthopedics;  Laterality: Left;    History reviewed. No pertinent family history.  Social History:  reports that he quit smoking about 16 months ago. He has never used smokeless tobacco. He reports current alcohol use. He reports current drug use. Drugs: IV and Cocaine.  Allergies:  Allergies  Allergen Reactions  . Bee Venom Swelling    Medications: I have reviewed the patient's current medications.  Results for orders placed or performed during the hospital encounter of 04/14/19 (from the past 48 hour(s))  Comprehensive metabolic panel     Status: Abnormal   Collection Time: 04/14/19 10:14 PM  Result Value Ref Range   Sodium 138 135 - 145 mmol/L   Potassium 4.2 3.5 - 5.1 mmol/L   Chloride 103 98 - 111 mmol/L   CO2 25 22 - 32 mmol/L   Glucose, Bld 77 70 - 99 mg/dL   BUN 16 6 - 20 mg/dL   Creatinine, Ser 0.94 0.61 - 1.24 mg/dL   Calcium 8.5 (L) 8.9 - 10.3 mg/dL   Total Protein 7.1 6.5 - 8.1 g/dL   Albumin 3.5 3.5 - 5.0 g/dL   AST 64 (H) 15 - 41 U/L   ALT 66 (H) 0 - 44 U/L   Alkaline Phosphatase 49 38 - 126 U/L   Total Bilirubin 1.1 0.3 - 1.2 mg/dL   GFR calc non Af Amer >60 >60 mL/min   GFR calc Af Amer >60 >60  mL/min   Anion gap 10 5 - 15    Comment: Performed at [facility], [address], [place], [place] [address]  CBC with Differential     Status: Abnormal   Collection Time: 04/14/19 10:14 PM  Result Value Ref Range   WBC 12.7 (H) 4.0 - 10.5 K/uL   RBC 4.10 (L) 4.22 - 5.81 MIL/uL   Hemoglobin 12.0 (L) 13.0 - 17.0 g/dL   HCT 83.6 (L) 62.9 - 47.6 %   MCV 92.0 80.0 - 100.0 fL   MCH 29.3 26.0 - 34.0 pg   MCHC 31.8 30.0 - 36.0 g/dL   RDW 54.6 50.3 - 54.6 %   Platelets 228 150 - 400 K/uL   nRBC 0.0 0.0 - 0.2 %   Neutrophils Relative % 72 %   Neutro Abs 9.1 (H) 1.7 - 7.7 K/uL   Lymphocytes Relative 15 %   Lymphs Abs 1.9 0.7 - 4.0 K/uL   Monocytes Relative 11 %   Monocytes Absolute 1.4 (H) 0.1 - 1.0 K/uL   Eosinophils Relative 2 %   Eosinophils Absolute 0.2 0.0 - 0.5 K/uL   Basophils Relative 0 %   Basophils  Absolute 0.0 0.0 - 0.1 K/uL   Immature Granulocytes 0 %   Abs Immature Granulocytes 0.05 0.00 - 0.07 K/uL    Comment: Performed at [facility], [place] [address], [place], [place] [address]  Ethanol     Status: None   Collection Time: 04/14/19 10:14 PM  Result Value Ref Range   Alcohol, Ethyl (B) <10 <10 mg/dL    Comment: (NOTE) Lowest detectable limit for serum alcohol is 10 mg/dL. For medical purposes only. Performed at [facility], [place] [address], [place], [place] [address]   Respiratory Panel by RT PCR (Flu A&B, Covid) - Nasopharyngeal Swab     Status: None   Collection Time: 04/15/19  7:07 AM   Specimen: Nasopharyngeal Swab  Result Value Ref Range   SARS Coronavirus 2 by RT PCR NEGATIVE NEGATIVE    Comment: (NOTE) SARS-CoV-2 target nucleic acids are NOT DETECTED. The SARS-CoV-2 RNA is generally detectable in upper respiratoy specimens during the acute phase of infection. The lowest concentration of SARS-CoV-2 viral copies this assay can detect is 131 copies/mL. A negative result does not preclude  SARS-Cov-2 infection and should not be used as the sole basis for treatment or other patient management decisions. A negative result may occur with  improper specimen collection/handling, submission of specimen other than nasopharyngeal swab, presence of viral mutation(s) within the areas targeted by this assay, and inadequate number of viral copies (<131 copies/mL). A negative result must be combined with clinical observations, patient history, and epidemiological information. The expected result is Negative. Fact Sheet for Patients:  PinkCheek.be Fact Sheet for Healthcare Providers:  GravelBags.it This test is not yet ap proved or cleared by the [place] FDA and  has been authorized for detection and/or diagnosis of SARS-CoV-2 by FDA under an Emergency Use Authorization (EUA). This EUA will remain  in effect (meaning this test can be used) for the duration of the COVID-19 declaration under Section 564(b)(1) of the Act, 21 U.S.C. section 360bbb-3(b)(1), unless the authorization is terminated or revoked sooner.    Influenza A by PCR NEGATIVE NEGATIVE   Influenza B by PCR NEGATIVE NEGATIVE    Comment: (NOTE) The Xpert Xpress SARS-CoV-2/FLU/RSV assay is intended as an aid in  the diagnosis of influenza from Nasopharyngeal swab specimens and  should not be used as a sole basis for treatment. Nasal washings and  aspirates are unacceptable for Xpert Xpress SARS-CoV-2/FLU/RSV  testing. Fact Sheet for Patients: PinkCheek.be Fact Sheet for Healthcare Providers: GravelBags.it This test is not yet approved or cleared by the [place] FDA and  has been authorized for detection and/or diagnosis of SARS-CoV-2 by  FDA under an Emergency Use Authorization (EUA). This EUA will remain  in effect (meaning this test can be used) for the duration of the  Covid-19 declaration  under Section 564(b)(1) of the Act, 21  U.S.C. section 360bbb-3(b)(1), unless the authorization is  terminated or revoked. Performed at [facility], [place] [address], [place],  [address]     CT Soft Tissue Neck W Contrast  Result Date: 04/15/2019 CLINICAL DATA:  Initial evaluation for neck abscess. EXAM: CT NECK WITH CONTRAST TECHNIQUE: Multidetector CT imaging of the neck was performed using the standard protocol following the bolus administration of intravenous contrast. CONTRAST:  80mL OMNIPAQUE IOHEXOL 300 MG/ML  SOLN COMPARISON:  None. FINDINGS: Pharynx and larynx: Oral cavity within normal limits. Palatine tonsils symmetric and within normal limits. Parapharyngeal fat maintained. Nasopharynx and oropharynx within normal limits.  No retropharyngeal collection. Epiglottis normal. Vallecula clear. Remainder of the hypopharynx and supraglottic larynx within normal limits. True cords symmetric and normal. Subglottic airway clear. Salivary glands: Salivary glands including the parotid and submandibular glands are within normal limits. Thyroid: Normal. Lymph nodes: No pathologically enlarged lymph nodes seen within the neck. Vascular: Normal intravascular enhancement seen throughout the neck. Limited intracranial: Unremarkable. Visualized orbits: Unremarkable. Mastoids and visualized paranasal sinuses: Mild mucosal thickening within the right maxillary sinus. Visualized paranasal sinuses are otherwise clear. Mastoid air cells and middle ear cavities are well pneumatized and free of fluid. Skeleton: No acute osseous abnormality. No discrete or worrisome osseous lesions. Prominent dental caries noted at the left second mandibular molar as well as the right second maxillary molar. Upper chest: Visualized lungs are clear. Other: Extensive soft tissue swelling with inflammatory stranding seen involving the left paramedian soft tissues of the lower anterior neck, just superior to the left  sternoclavicular joint, consistent with cellulitis. Inflammatory stranding extends into the left upper mediastinum. There is a superimposed rim enhancing hypodense collection measuring 3.4 x 2.1 x 2.5 cm, consistent with abscess (series 2, image 102). This is closely positioned to the adjacent left sternoclavicular joint, which could conceivably be infected, although no definite osseous erosive changes are seen to suggest osteomyelitis. Inflammatory stranding with swelling extends inferiorly into the left anterior upper chest wall as well. IMPRESSION: Extensive soft tissue swelling with inflammatory stranding involving the left paramedian soft tissues of the lower anterior neck, just superior to the left sternoclavicular joint, consistent with cellulitis/infection. Superimposed 3.4 x 2.1 x 2.5 cm collection within this region, consistent with abscess. This is closely positioned to the adjacent left sternoclavicular joint, which could conceivably be infected, although no definite osseous erosive changes are seen to suggest osteomyelitis. Electronically Signed   By: Rise Mu M.D.   On: 04/15/2019 05:44    Review of Systems Blood pressure (!) 150/99, pulse (!) 116, temperature 99.7 F (37.6 C), temperature source Oral, resp. rate 18, SpO2 97 %. Physical Exam  Constitutional: He appears well-developed.  HENT:  Head: Normocephalic and atraumatic.  Nose: Nose normal.  Mouth/Throat: Oropharynx is clear and moist.  Eyes: Pupils are equal, round, and reactive to light. Conjunctivae are normal.  Neck:  Swelling over the left chest and clavicle. The epicenter of the infection is over the joint. It is very tender to palpation. Some skin cellulitis.     Assessment/Plan: Left neck/chest abscess- this seems to be centered around the clavicle joint. It has area of cellulitis and collection of fluid. I think this needs to be treated with IV antibiotics to cover staph/MRSA. Will see how he responds  first. If this is the joint than will need orthopedics to be consulted as I do not treat septic joints.   [name] 04/15/2019, 8:45 AM

## 2019-04-15 NOTE — ED Provider Notes (Addendum)
[facility]-EMERGENCY DEPT Provider Note   CSN: [id number] Arrival date & time: 04/14/19  2203     History Chief Complaint  Patient presents with  . Drug Overdose    Grant Tapia is a 34 y.o. male.  HPI Patient presents to the emergency department with overdose of heroin.  Patient was found by friends who have overdosed on heroin.  EMS was called and arrived to assist the patient with this.  Patient is unable to give any history on initial examination.  Patient has a history of drug abuse.    Past Medical History:  Diagnosis Date  . Heroin abuse (HCC)   . Hypertension   . IV drug user   . Overdose   . Polysubstance abuse [facility])     Patient Active Problem List   Diagnosis Date Noted  . Poisoning by heroin, accidental (unintentional), initial encounter (HCC) 05/08/2017  . Heroin dependence (HCC) 05/08/2017  . Back pain 12/30/2016  . Abnormal LFTs 12/30/2016  . Polysubstance abuse (HCC)   . Cellulitis 11/19/2016  . IV drug user 11/19/2016  . Abscess 11/19/2016  . Delirium   . Tachycardia   . Overdose 07/18/2016  . Drug withdrawal (HCC) 07/16/2016  . AKI (acute kidney injury) (HCC) 07/15/2016  . Alcohol abuse 07/15/2016  . Heroin abuse (HCC) 07/15/2016  . 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 Cocaine abuse (HCC) 07/15/2016  . Acute metabolic encephalopathy 07/15/2016  . Lactic acidosis 03/29/2014  . Sepsis (HCC) 03/27/2014  . Chest pain   . Headache   . Neck pain     Past Surgical History:  Procedure Laterality Date  . I & D EXTREMITY Left 11/19/2016   Procedure: INCISION AND DRAINAGE LEFT ELBOW  ABSCESS;  Surgeon: Myrene Galas, MD;  Location: [facility] OR;  Service: Orthopedics;  Laterality: Left;       History reviewed. No pertinent family history.  Social History   Tobacco Use  . Smoking status: Former Smoker    Quit date: 12/12/2017    Years since quitting: 1.3  . Smokeless tobacco: Never Used  Substance Use Topics  .  Alcohol use: Yes    Comment: occasional wine  . Drug use: Yes    Types: IV, Cocaine    Comment: heroin    Home Medications Prior to Admission medications   Medication Sig Start Date End Date Taking? Authorizing Provider  diclofenac sodium (VOLTAREN) 1 % GEL Apply 4 g topically 4 (four) times daily. Patient not taking: Reported on 01/08/2019 01/05/19   Palumbo, April, MD  lidocaine (LIDODERM) 5 % Place 1 patch onto the skin daily. Remove & Discard patch within 12 hours or as directed by MD Patient not taking: Reported on 01/08/2019 01/05/19   Palumbo, April, MD    Allergies    Bee venom  Review of Systems   Review of Systems Level 5 caveat applies due to drug overdose Physical Exam Updated Vital Signs BP (!) 157/93   Pulse (!) 116   Temp 99.7 F (37.6 C) (Oral)   Resp (!) 22   SpO2 96%   Physical Exam Vitals and nursing note reviewed.  Constitutional:      General: He is not in acute distress.    Appearance: He is well-developed.  HENT:     Head: Normocephalic and atraumatic.  Eyes:     Pupils: Pupils are equal, round, and reactive to light.  Neck:   Cardiovascular:     Rate and Rhythm: Normal rate and regular  rhythm.     Heart sounds: Normal heart sounds. No murmur. No friction rub. No gallop.   Pulmonary:     Effort: Pulmonary effort is normal. No respiratory distress.     Breath sounds: Normal breath sounds. No wheezing.  Musculoskeletal:     Cervical back: Normal range of motion and neck supple.  Skin:    General: Skin is warm and dry.     Capillary Refill: Capillary refill takes less than 2 seconds.     Findings: No erythema or rash.  Neurological:     Mental Status: He is unresponsive.     Motor: No abnormal muscle tone.     Coordination: Coordination normal.  Psychiatric:        Behavior: Behavior normal.     ED Results / Procedures / Treatments   Labs (all labs ordered are listed, but only abnormal results are displayed) Labs Reviewed    COMPREHENSIVE METABOLIC PANEL - Abnormal; Notable for the following components:      Result Value   Calcium 8.5 (*)    AST 64 (*)    ALT 66 (*)    All other components within normal limits  CBC WITH DIFFERENTIAL/PLATELET - Abnormal; Notable for the following components:   WBC 12.7 (*)    RBC 4.10 (*)    Hemoglobin 12.0 (*)    HCT 37.7 (*)    Neutro Abs 9.1 (*)    Monocytes Absolute 1.4 (*)    All other components within normal limits  ETHANOL    EKG None  Radiology No results found.  Procedures Procedures (including critical care time)  Medications Ordered in ED Medications  sodium chloride 0.9 % bolus 1,000 mL (0 mLs Intravenous Stopped 04/14/19 2332)  iohexol (OMNIPAQUE) 300 MG/ML solution 75 mL (75 mLs Intravenous Contrast Given 04/15/19 0516)    ED Course  I have reviewed the triage vital signs and the nursing notes.  Pertinent labs & imaging results that were available during my care of the patient were reviewed by me and considered in my medical decision making (see chart for details).    MDM Rules/Calculators/A&P                      Patient has been observed over many hours here in the emergency department and has come more awake and alert.  The patient states that he does not recall what happened after injecting heroin.  The patient is complaining of some swelling to the left side of his neck.  Will obtain a CT scan to further evaluate this area.  Patient continues to become more alert and responsive.  Patient is thus far fairly stable.  Patient has a CT scan that does show an abscess and infection in the anterior left neck.  I will speak with ENT and get the patient admitted by the [facility] hospitalist.  Patient's visit was hindered by the fact that he had been given Versed in route and was also under the influence of a drug.  Patient will be seen by Dr. Janace Hoard of ENT and he requests that he be admitted over at [facility]. Final Clinical Impression(s) / ED  Diagnoses Final diagnoses:  None    Rx / DC Orders ED Discharge Orders    None         Dalia Heading, PA-C 04/15/19 0654    Molpus, Jenny Reichmann, MD 04/15/19 [phone]

## 2019-04-15 NOTE — ED Notes (Signed)
I have just called report to Lillia Abed, Charity fundraiser on [address] at [place].

## 2019-04-15 NOTE — Progress Notes (Signed)
  Echocardiogram 2D Echocardiogram has been performed.  Grant Tapia 04/15/2019, 11:34 AM

## 2019-04-15 NOTE — Progress Notes (Addendum)
Temp now 103.1. MD notified and Ibuprofen and blood cultures ordered.   Patient is now a red MEWS, rapid response notified.

## 2019-04-15 NOTE — Progress Notes (Signed)
Received patient as transfer from [facility]. Patient alert and oriented. Self ambulated from stretched to bed. Patient's HR 138, BP 160/111, Temp 100.2, RR 24. Dr. Ronaldo Miyamoto notified, states he will enter new orders. Awaiting orders.

## 2019-04-15 NOTE — H&P (Signed)
.  History and Physical    Grant Tapia PIR:[id number] DOB: 1985-07-13 DOA: 04/14/2019  PCP: Patient, No Pcp Per  Patient coming from: Home   Chief Complaint: Neck pain  HPI: Grant Tapia is a 34 y.o. male with medical history significant of polysubstance abuse. Reports that for the last month he has had various areas of pain (both joint and skin) moving up his left side. He notes that for the last few days he has had severe left neck pain and swelling. It has not yet hinder him from eat or drinking, but he is concerned it will. He knows of no alleviating factors. Movement or palpation aggravates it. The pain is constant and ranges from throbbing to sharp dependent on rotation of the neck or palpation of the neck. He became concerned after hearing a story from a fellow heroin user and sought out help in the ED.   ED Course: CT of neck shows abscess. ED spoke with ENT who requested a medical admission and transfer to [facility]. He was started on vanc/zosyn. [facility] was called for admission.   Review of Systems: Denies CP, ab pain, dyspnea, HA, F, N, V. Reports neck pain. Remainder of 10 point review of systems is otherwise negative for all not mentioned in HPI.    Past Medical History:  Diagnosis Date  . Heroin abuse (HCC)   . Hypertension   . IV drug user   . Overdose   . Polysubstance abuse [facility]     Past Surgical History:  Procedure Laterality Date  . I & D EXTREMITY Left 11/19/2016   Procedure: INCISION AND DRAINAGE LEFT ELBOW  ABSCESS;  Surgeon: Myrene Galas, MD;  Location: [facility] OR;  Service: Orthopedics;  Laterality: Left;     reports that he quit smoking about 16 months ago. He has never used smokeless tobacco. He reports current alcohol use. He reports current drug use. Drugs: IV and Cocaine.  Allergies  Allergen Reactions  . Bee Venom Swelling    History reviewed. No pertinent family history.  Prior to Admission medications   Medication Sig Start Date End Date  Taking? Authorizing Provider  diclofenac sodium (VOLTAREN) 1 % GEL Apply 4 g topically 4 (four) times daily. Patient not taking: Reported on 01/08/2019 01/05/19   Palumbo, April, MD  lidocaine (LIDODERM) 5 % Place 1 patch onto the skin daily. Remove & Discard patch within 12 hours or as directed by MD Patient not taking: Reported on 01/08/2019 01/05/19   Nicanor Alcon, April, MD    Physical Exam: Vitals:   04/15/19 0600 04/15/19 0615 04/15/19 0645 04/15/19 0700  BP: (!) 155/103 (!) 154/104 (!) 153/103 (!) 145/113  Pulse: (!) 115 (!) 122 (!) 119 (!) 115  Resp: 17 18  18   Temp:      TempSrc:      SpO2: 98% 95% 97% 98%    Constitutional: 34 y.o. male NAD, calm, comfortable Vitals:   04/15/19 0600 04/15/19 0615 04/15/19 0645 04/15/19 0700  BP: (!) 155/103 (!) 154/104 (!) 153/103 (!) 145/113  Pulse: (!) 115 (!) 122 (!) 119 (!) 115  Resp: 17 18  18   Temp:      TempSrc:      SpO2: 98% 95% 97% 98%   General: 34 y.o. male resting in bed, somewhat anxious Eyes: PERRL, normal sclera ENMT: Nares patent w/o discharge, orophaynx clear, dentition normal, ears w/o discharge/lesions/ulcers, fullness of left inferior neck w/ TTP throughout left side (most severe at sternoclavicular joint) w/ some  erythema but no purulence Cardiovascular: tachy, +S1, S2, no m/g/r, equal pulses throughout Respiratory: CTABL, no w/r/r, normal WOB, no stridor pressent GI: BS+, NDNT, no masses noted, no organomegaly noted MSK: No e/c/c Skin: No bruises, ulcerations noted; small petechial-like spots on forehead  Neuro: A&O x 3, no focal deficits Psyc: Appropriate interaction and affect, somewhat anxious but cooperative  Labs on Admission: I have personally reviewed following labs and imaging studies  CBC: Recent Labs  Lab 04/14/19 2214  WBC 12.7*  NEUTROABS 9.1*  HGB 12.0*  HCT 37.7*  MCV 92.0  PLT 902   Basic Metabolic Panel: Recent Labs  Lab 04/14/19 2214  NA 138  K 4.2  CL 103  CO2 25  GLUCOSE 77  BUN  16  CREATININE 1.02  CALCIUM 8.5*   GFR: CrCl cannot be calculated (Unknown ideal weight.). Liver Function Tests: Recent Labs  Lab 04/14/19 2214  AST 64*  ALT 66*  ALKPHOS 49  BILITOT 1.1  PROT 7.1  ALBUMIN 3.5   No results for input(s): LIPASE, AMYLASE in the last 168 hours. No results for input(s): AMMONIA in the last 168 hours. Coagulation Profile: No results for input(s): INR, PROTIME in the last 168 hours. Cardiac Enzymes: No results for input(s): CKTOTAL, CKMB, CKMBINDEX, TROPONINI in the last 168 hours. BNP (last 3 results) No results for input(s): PROBNP in the last 8760 hours. HbA1C: No results for input(s): HGBA1C in the last 72 hours. CBG: No results for input(s): GLUCAP in the last 168 hours. Lipid Profile: No results for input(s): CHOL, HDL, LDLCALC, TRIG, CHOLHDL, LDLDIRECT in the last 72 hours. Thyroid Function Tests: No results for input(s): TSH, T4TOTAL, FREET4, T3FREE, THYROIDAB in the last 72 hours. Anemia Panel: No results for input(s): VITAMINB12, FOLATE, FERRITIN, TIBC, IRON, RETICCTPCT in the last 72 hours. Urine analysis:    Component Value Date/Time   COLORURINE YELLOW 12/30/2016 0215   APPEARANCEUR CLEAR 12/30/2016 0215   LABSPEC 1.021 12/30/2016 0215   PHURINE 5.0 12/30/2016 0215   GLUCOSEU NEGATIVE 12/30/2016 0215   HGBUR NEGATIVE 12/30/2016 0215   BILIRUBINUR NEGATIVE 12/30/2016 0215   KETONESUR NEGATIVE 12/30/2016 0215   PROTEINUR NEGATIVE 12/30/2016 0215   UROBILINOGEN 0.2 03/26/2014 2255   NITRITE NEGATIVE 12/30/2016 0215   LEUKOCYTESUR NEGATIVE 12/30/2016 0215    Radiological Exams on Admission: CT Soft Tissue Neck W Contrast  Result Date: 04/15/2019 CLINICAL DATA:  Initial evaluation for neck abscess. EXAM: CT NECK WITH CONTRAST TECHNIQUE: Multidetector CT imaging of the neck was performed using the standard protocol following the bolus administration of intravenous contrast. CONTRAST:  80mL OMNIPAQUE IOHEXOL 300 MG/ML  SOLN  COMPARISON:  None. FINDINGS: Pharynx and larynx: Oral cavity within normal limits. Palatine tonsils symmetric and within normal limits. Parapharyngeal fat maintained. Nasopharynx and oropharynx within normal limits. No retropharyngeal collection. Epiglottis normal. Vallecula clear. Remainder of the hypopharynx and supraglottic larynx within normal limits. True cords symmetric and normal. Subglottic airway clear. Salivary glands: Salivary glands including the parotid and submandibular glands are within normal limits. Thyroid: Normal. Lymph nodes: No pathologically enlarged lymph nodes seen within the neck. Vascular: Normal intravascular enhancement seen throughout the neck. Limited intracranial: Unremarkable. Visualized orbits: Unremarkable. Mastoids and visualized paranasal sinuses: Mild mucosal thickening within the right maxillary sinus. Visualized paranasal sinuses are otherwise clear. Mastoid air cells and middle ear cavities are well pneumatized and free of fluid. Skeleton: No acute osseous abnormality. No discrete or worrisome osseous lesions. Prominent dental caries noted at the left second mandibular molar as  well as the right second maxillary molar. Upper chest: Visualized lungs are clear. Other: Extensive soft tissue swelling with inflammatory stranding seen involving the left paramedian soft tissues of the lower anterior neck, just superior to the left sternoclavicular joint, consistent with cellulitis. Inflammatory stranding extends into the left upper mediastinum. There is a superimposed rim enhancing hypodense collection measuring 3.4 x 2.1 x 2.5 cm, consistent with abscess (series 2, image 102). This is closely positioned to the adjacent left sternoclavicular joint, which could conceivably be infected, although no definite osseous erosive changes are seen to suggest osteomyelitis. Inflammatory stranding with swelling extends inferiorly into the left anterior upper chest wall as well. IMPRESSION:  Extensive soft tissue swelling with inflammatory stranding involving the left paramedian soft tissues of the lower anterior neck, just superior to the left sternoclavicular joint, consistent with cellulitis/infection. Superimposed 3.4 x 2.1 x 2.5 cm collection within this region, consistent with abscess. This is closely positioned to the adjacent left sternoclavicular joint, which could conceivably be infected, although no definite osseous erosive changes are seen to suggest osteomyelitis. Electronically Signed   By: Rise Mu M.D.   On: 04/15/2019 05:44    Assessment/Plan Active Problems:   Sepsis (HCC)   Neck pain   Neck abscess   Polysubstance abuse (HCC)   Abnormal LFTs   HTN (hypertension), benign    SIRS Neck pain Neck abscess     - CT shows abscess just superior to left SCJ     - reports that his neck pain had been worse in the last 2 days; however, he has multiple areas of joint pain on the left side that have resolved     - ED has spoken with ENT; they would like him moved to [facility] for eval/I&D     - continue vanc/zosyn; pharm to dose     - check blood Cx and echo     - consider ID consult  Polysubstance abuse     - says he hasn't used anything since 04/02/19     - counseled against further use of illicits and EtOH     - check drug screen  Abnormal LFTs     - check hepatitis panel  HTN     - says this would be a new Dx for him     - question if pain related; as he was initially hypotensive     - will have PRNs for now, monitor  DVT prophylaxis: heparin  Code Status: FULL  Family Communication: None at bedside  Disposition Plan: TO [facility] for ENT eval, procedure  Consults called: ENT called by ED  Admission status: Inpatient. Need for IV abx, IV fluids. Ongoing w/u.     Teddy Spike DO [facility]  If 7PM-7AM, please contact night-coverage [email or web address]  04/15/2019, 7:45 AM

## 2019-04-15 NOTE — Consult Note (Signed)
Reason for Consult:left neck infection Referring Physician: er  Rea Grant Tapia is an 34 y.o. male.  HPI: hx of swelling in the left anterior neck /chest. It occurred 1 month ago and spontaneously resolved. He now has pain and swelling again since 12/30. He has some limitation of movement of neck. He is IV drug user but denies and trauma or sticks to the neck. He has no dysphagia or odynophagia. No breathing issues.  Past Medical History:  Diagnosis Date  . Heroin abuse (HCC)   . Hypertension   . IV drug user   . Overdose   . Polysubstance abuse [facility])     Past Surgical History:  Procedure Laterality Date  . I & D EXTREMITY Left 11/19/2016   Procedure: INCISION AND DRAINAGE LEFT ELBOW  ABSCESS;  Surgeon: Myrene Galas, MD;  Location: [facility] OR;  Service: Orthopedics;  Laterality: Left;    History reviewed. No pertinent family history.  Social History:  reports that he quit smoking about 16 months ago. He has never used smokeless tobacco. He reports current alcohol use. He reports current drug use. Drugs: IV and Cocaine.  Allergies:  Allergies  Allergen Reactions  . Bee Venom Swelling    Medications: I have reviewed the patient's current medications.  Results for orders placed or performed during the hospital encounter of 04/14/19 (from the past 48 hour(s))  Comprehensive metabolic panel     Status: Abnormal   Collection Time: 04/14/19 10:14 PM  Result Value Ref Range   Sodium 138 135 - 145 mmol/L   Potassium 4.2 3.5 - 5.1 mmol/L   Chloride 103 98 - 111 mmol/L   CO2 25 22 - 32 mmol/L   Glucose, Bld 77 70 - 99 mg/dL   BUN 16 6 - 20 mg/dL   Creatinine, Ser 0.94 0.61 - 1.24 mg/dL   Calcium 8.5 (L) 8.9 - 10.3 mg/dL   Total Protein 7.1 6.5 - 8.1 g/dL   Albumin 3.5 3.5 - 5.0 g/dL   AST 64 (H) 15 - 41 U/L   ALT 66 (H) 0 - 44 U/L   Alkaline Phosphatase 49 38 - 126 U/L   Total Bilirubin 1.1 0.3 - 1.2 mg/dL   GFR calc non Af Amer >60 >60 mL/min   GFR calc Af Amer >60 >60  mL/min   Anion gap 10 5 - 15    Comment: Performed at [facility], [address], [place], [place] [address]  CBC with Differential     Status: Abnormal   Collection Time: 04/14/19 10:14 PM  Result Value Ref Range   WBC 12.7 (H) 4.0 - 10.5 K/uL   RBC 4.10 (L) 4.22 - 5.81 MIL/uL   Hemoglobin 12.0 (L) 13.0 - 17.0 g/dL   HCT 83.6 (L) 62.9 - 47.6 %   MCV 92.0 80.0 - 100.0 fL   MCH 29.3 26.0 - 34.0 pg   MCHC 31.8 30.0 - 36.0 g/dL   RDW 54.6 50.3 - 54.6 %   Platelets 228 150 - 400 K/uL   nRBC 0.0 0.0 - 0.2 %   Neutrophils Relative % 72 %   Neutro Abs 9.1 (H) 1.7 - 7.7 K/uL   Lymphocytes Relative 15 %   Lymphs Abs 1.9 0.7 - 4.0 K/uL   Monocytes Relative 11 %   Monocytes Absolute 1.4 (H) 0.1 - 1.0 K/uL   Eosinophils Relative 2 %   Eosinophils Absolute 0.2 0.0 - 0.5 K/uL   Basophils Relative 0 %   Basophils  Absolute 0.0 0.0 - 0.1 K/uL   Immature Granulocytes 0 %   Abs Immature Granulocytes 0.05 0.00 - 0.07 K/uL    Comment: Performed at [facility], [place] [address], [place], [place] [address]  Ethanol     Status: None   Collection Time: 04/14/19 10:14 PM  Result Value Ref Range   Alcohol, Ethyl (B) <10 <10 mg/dL    Comment: (NOTE) Lowest detectable limit for serum alcohol is 10 mg/dL. For medical purposes only. Performed at [facility], [place] [address], [place], [place] [address]   Respiratory Panel by RT PCR (Flu A&B, Covid) - Nasopharyngeal Swab     Status: None   Collection Time: 04/15/19  7:07 AM   Specimen: Nasopharyngeal Swab  Result Value Ref Range   SARS Coronavirus 2 by RT PCR NEGATIVE NEGATIVE    Comment: (NOTE) SARS-CoV-2 target nucleic acids are NOT DETECTED. The SARS-CoV-2 RNA is generally detectable in upper respiratoy specimens during the acute phase of infection. The lowest concentration of SARS-CoV-2 viral copies this assay can detect is 131 copies/mL. A negative result does not preclude  SARS-Cov-2 infection and should not be used as the sole basis for treatment or other patient management decisions. A negative result may occur with  improper specimen collection/handling, submission of specimen other than nasopharyngeal swab, presence of viral mutation(s) within the areas targeted by this assay, and inadequate number of viral copies (<131 copies/mL). A negative result must be combined with clinical observations, patient history, and epidemiological information. The expected result is Negative. Fact Sheet for Patients:  PinkCheek.be Fact Sheet for Healthcare Providers:  GravelBags.it This test is not yet ap proved or cleared by the [place] FDA and  has been authorized for detection and/or diagnosis of SARS-CoV-2 by FDA under an Emergency Use Authorization (EUA). This EUA will remain  in effect (meaning this test can be used) for the duration of the COVID-19 declaration under Section 564(b)(1) of the Act, 21 U.S.C. section 360bbb-3(b)(1), unless the authorization is terminated or revoked sooner.    Influenza A by PCR NEGATIVE NEGATIVE   Influenza B by PCR NEGATIVE NEGATIVE    Comment: (NOTE) The Xpert Xpress SARS-CoV-2/FLU/RSV assay is intended as an aid in  the diagnosis of influenza from Nasopharyngeal swab specimens and  should not be used as a sole basis for treatment. Nasal washings and  aspirates are unacceptable for Xpert Xpress SARS-CoV-2/FLU/RSV  testing. Fact Sheet for Patients: PinkCheek.be Fact Sheet for Healthcare Providers: GravelBags.it This test is not yet approved or cleared by the [place] FDA and  has been authorized for detection and/or diagnosis of SARS-CoV-2 by  FDA under an Emergency Use Authorization (EUA). This EUA will remain  in effect (meaning this test can be used) for the duration of the  Covid-19 declaration  under Section 564(b)(1) of the Act, 21  U.S.C. section 360bbb-3(b)(1), unless the authorization is  terminated or revoked. Performed at [facility], [place] [address], [place],  [address]     CT Soft Tissue Neck W Contrast  Result Date: 04/15/2019 CLINICAL DATA:  Initial evaluation for neck abscess. EXAM: CT NECK WITH CONTRAST TECHNIQUE: Multidetector CT imaging of the neck was performed using the standard protocol following the bolus administration of intravenous contrast. CONTRAST:  80mL OMNIPAQUE IOHEXOL 300 MG/ML  SOLN COMPARISON:  None. FINDINGS: Pharynx and larynx: Oral cavity within normal limits. Palatine tonsils symmetric and within normal limits. Parapharyngeal fat maintained. Nasopharynx and oropharynx within normal limits.  No retropharyngeal collection. Epiglottis normal. Vallecula clear. Remainder of the hypopharynx and supraglottic larynx within normal limits. True cords symmetric and normal. Subglottic airway clear. Salivary glands: Salivary glands including the parotid and submandibular glands are within normal limits. Thyroid: Normal. Lymph nodes: No pathologically enlarged lymph nodes seen within the neck. Vascular: Normal intravascular enhancement seen throughout the neck. Limited intracranial: Unremarkable. Visualized orbits: Unremarkable. Mastoids and visualized paranasal sinuses: Mild mucosal thickening within the right maxillary sinus. Visualized paranasal sinuses are otherwise clear. Mastoid air cells and middle ear cavities are well pneumatized and free of fluid. Skeleton: No acute osseous abnormality. No discrete or worrisome osseous lesions. Prominent dental caries noted at the left second mandibular molar as well as the right second maxillary molar. Upper chest: Visualized lungs are clear. Other: Extensive soft tissue swelling with inflammatory stranding seen involving the left paramedian soft tissues of the lower anterior neck, just superior to the left  sternoclavicular joint, consistent with cellulitis. Inflammatory stranding extends into the left upper mediastinum. There is a superimposed rim enhancing hypodense collection measuring 3.4 x 2.1 x 2.5 cm, consistent with abscess (series 2, image 102). This is closely positioned to the adjacent left sternoclavicular joint, which could conceivably be infected, although no definite osseous erosive changes are seen to suggest osteomyelitis. Inflammatory stranding with swelling extends inferiorly into the left anterior upper chest wall as well. IMPRESSION: Extensive soft tissue swelling with inflammatory stranding involving the left paramedian soft tissues of the lower anterior neck, just superior to the left sternoclavicular joint, consistent with cellulitis/infection. Superimposed 3.4 x 2.1 x 2.5 cm collection within this region, consistent with abscess. This is closely positioned to the adjacent left sternoclavicular joint, which could conceivably be infected, although no definite osseous erosive changes are seen to suggest osteomyelitis. Electronically Signed   By: Benjamin  McClintock M.D.   On: 04/15/2019 05:44    Review of Systems Blood pressure (!) 150/99, pulse (!) 116, temperature 99.7 F (37.6 C), temperature source Oral, resp. rate 18, SpO2 97 %. Physical Exam  Constitutional: He appears well-developed.  HENT:  Head: Normocephalic and atraumatic.  Nose: Nose normal.  Mouth/Throat: Oropharynx is clear and moist.  Eyes: Pupils are equal, round, and reactive to light. Conjunctivae are normal.  Neck:  Swelling over the left chest and clavicle. The epicenter of the infection is over the joint. It is very tender to palpation. Some skin cellulitis.     Assessment/Plan: Left neck/chest abscess- this seems to be centered around the clavicle joint. It has area of cellulitis and collection of fluid. I think this needs to be treated with IV antibiotics to cover staph/MRSA. Will see how he responds  first. If this is the joint than will need orthopedics to be consulted as I do not treat septic joints.   Bruin Bolger 04/15/2019, 8:45 AM     

## 2019-04-16 DIAGNOSIS — A419 Sepsis, unspecified organism: Secondary | ICD-10-CM

## 2019-04-16 DIAGNOSIS — L0211 Cutaneous abscess of neck: Secondary | ICD-10-CM

## 2019-04-16 DIAGNOSIS — L03221 Cellulitis of neck: Secondary | ICD-10-CM

## 2019-04-16 LAB — SURGICAL PCR SCREEN
MRSA, PCR: POSITIVE — AB
Staphylococcus aureus: POSITIVE — AB

## 2019-04-16 LAB — HEPATITIS A ANTIBODY, IGM: Hep A IgM: NONREACTIVE

## 2019-04-16 MED ORDER — CHLORHEXIDINE GLUCONATE CLOTH 2 % EX PADS
6.0000 | MEDICATED_PAD | Freq: Every day | CUTANEOUS | Status: DC
  Administered 2019-04-17 – 2019-04-20 (×4): 6 via TOPICAL

## 2019-04-16 MED ORDER — MUPIROCIN 2 % EX OINT
1.0000 "application " | TOPICAL_OINTMENT | Freq: Two times a day (BID) | CUTANEOUS | Status: DC
  Administered 2019-04-17 – 2019-04-20 (×8): 1 via NASAL
  Filled 2019-04-16 (×3): qty 22

## 2019-04-16 NOTE — Consult Note (Addendum)
[address]       [place] [address]             [phone]        Casten Floren [facility] Medical Record #[id number] Date of Birth: [birth year]  Referring: No ref. provider found Primary Care: Patient, No Pcp Per Primary Cardiologist:No primary care provider on file.  Chief Complaint:    Chief Complaint  Patient presents with  . Neck pain with swelling    History of Present Illness:  Mr. Donnivan Villena is a 34 year old male with a past history of polysubstance abuse including heroin, cocaine, methamphetamine, alcohol tobacco.  He was taken from [facility] to the [facility] emergency department on 04/14/2019 [facility] for suspected heroin overdose.  After being observed for several hours in the ED he awoke and began complaining of swelling and pain in his left neck.  CT scan was performed with results detailed below.  Echocardiography was also performed and showed no ventricular or valvular dysfunction and no evidence of endocarditis.  The ENT service was consulted.  Recommendation was made for transfer to [facility] for further evaluation and treatment.  He was started on IV vancomycin and Zosyn.  Since his admission last evening, he spiked a fever to 103 F.  Blood cultures were obtained and were negative on initial read that was less than 12 hours old. At the time of this interview, Mr. Toledo is complaining of continued left neck and shoulder pain.  He denies any difficulty swallowing and is not short of breath.   Past Medical History:  Diagnosis Date  . Heroin abuse (HCC)   . Hypertension   . IV drug user   . Overdose   . Polysubstance abuse [facility])     Past Surgical History:  Procedure Laterality Date  . I & D EXTREMITY Left 11/19/2016   Procedure: INCISION AND DRAINAGE LEFT ELBOW  ABSCESS;  Surgeon: Myrene Galas, MD;  Location: [facility] OR;  Service: Orthopedics;  Laterality: Left;    Social History   Tobacco Use  Smoking  Status Former Smoker  . Quit date: 12/12/2017  . Years since quitting: 1.3  Smokeless Tobacco Never Used    Social History   Substance and Sexual Activity  Alcohol Use Yes   Comment: occasional wine     Allergies  Allergen Reactions  . Bee Venom Swelling    Current Facility-Administered Medications  Medication Dose Route Frequency Provider Last Rate Last Admin  . 0.9 %  sodium chloride infusion   Intravenous Continuous Margie Ege A, DO 75 mL/hr at 04/16/19 0950 New Bag at 04/16/19 0950  . acetaminophen (TYLENOL) tablet 650 mg  650 mg Oral Q6H PRN Ronaldo Miyamoto, Tyrone A, DO   650 mg at 04/16/19 0054   Or  . acetaminophen (TYLENOL) suppository 650 mg  650 mg Rectal Q6H PRN Ronaldo Miyamoto, Tyrone A, DO      . chlordiazePOXIDE (LIBRIUM) capsule 10 mg  10 mg Oral TID Ronaldo Miyamoto, Tyrone A, DO   10 mg at 04/16/19 0947  . docusate sodium (COLACE) capsule 100 mg  100 mg Oral BID Ronaldo Miyamoto, Tyrone A, DO   100 mg at 04/16/19 0948  . folic acid (FOLVITE) tablet 1 mg  1 mg Oral Daily Kyle, Tyrone A, DO   1 mg at 04/16/19 0948  . heparin injection 5,000 Units  5,000 Units Subcutaneous Q8H Kyle, Tyrone A, DO   5,000 Units at 04/16/19 4403  .  ibuprofen (ADVIL) tablet 600 mg  600 mg Oral Q6H PRN Ronaldo Miyamoto, Tyrone A, DO   600 mg at 04/16/19 0657  . LORazepam (ATIVAN) injection 0-4 mg  0-4 mg Intravenous Q6H Kyle, Tyrone A, DO   1 mg at 04/15/19 1630   Followed by  . [START ON 04/17/2019] LORazepam (ATIVAN) injection 0-4 mg  0-4 mg Intravenous Q12H Kyle, Tyrone A, DO      . LORazepam (ATIVAN) tablet 1-4 mg  1-4 mg Oral Q1H PRN Ronaldo Miyamoto, Tyrone A, DO       Or  . LORazepam (ATIVAN) injection 1-4 mg  1-4 mg Intravenous Q1H PRN Ronaldo Miyamoto, Tyrone A, DO   2 mg at 04/16/19 0055  . multivitamin with minerals tablet 1 tablet  1 tablet Oral Daily Kyle, Tyrone A, DO   1 tablet at 04/16/19 0947  . piperacillin-tazobactam (ZOSYN) IVPB 3.375 g  3.375 g Intravenous Q8H Stevphen Rochester, RPH 12.5 mL/hr at 04/16/19 0657 3.375 g at 04/16/19 0657  .  polyethylene glycol (MIRALAX / GLYCOLAX) packet 17 g  17 g Oral Daily PRN Ronaldo Miyamoto, Tyrone A, DO      . promethazine (PHENERGAN) tablet 12.5 mg  12.5 mg Oral Q6H PRN Ronaldo Miyamoto, Tyrone A, DO   12.5 mg at 04/15/19 0841  . thiamine tablet 100 mg  100 mg Oral Daily Kyle, Tyrone A, DO   100 mg at 04/16/19 0948  . vancomycin (VANCOREADY) IVPB 1500 mg/300 mL  1,500 mg Intravenous Q12H Stevphen Rochester, RPH 150 mL/hr at 04/16/19 1006 1,500 mg at 04/16/19 1006    Medications Prior to Admission  Medication Sig Dispense Refill Last Dose  . diclofenac sodium (VOLTAREN) 1 % GEL Apply 4 g topically 4 (four) times daily. (Patient not taking: Reported on 01/08/2019) 100 g 0   . lidocaine (LIDODERM) 5 % Place 1 patch onto the skin daily. Remove & Discard patch within 12 hours or as directed by MD (Patient not taking: Reported on 01/08/2019) 30 patch 0     History reviewed. No pertinent family history.   Review of Systems:   ROS    Cardiac Review of Systems: Y or  [    ]= no  Chest Pain [    ]  Resting SOB [   ] Exertional SOB  [  ]  Orthopnea [  ]   Pedal Edema [   ]    Palpitations [  ] Syncope  [  ]   Presyncope [   ]  General Review of Systems: [Y] = yes [  ]=no Constitional: recent weight change [  ]; anorexia [  ]; fatigue [  ]; nausea [  ]; night sweats [ y ]; fever [  ]; or chills [  ]                                                               Dental: Last Dentist visit:   Eye : blurred vision [  ]; diplopia [   ]; vision changes [  ];  Amaurosis fugax[  ]; Resp: cough [  ];  wheezing[  ];  hemoptysis[  ]; shortness of breath[  ]; paroxysmal nocturnal dyspnea[  ]; dyspnea on exertion[  ]; or orthopnea[  ];  GI:  gallstones[  ],  vomiting[  ];  dysphagia[  ]; melena[  ];  hematochezia [  ]; heartburn[  ];   Hx of  Colonoscopy[  ]; GU: kidney stones [  ]; hematuria[  ];   dysuria [  ];  nocturia[  ];  history of     obstruction [  ]; urinary frequency [  ]             Skin: rash, swelling[ y ];, hair loss[   ];  peripheral edema[  ];  or itching[  ]; Musculosketetal: myalgias[  ];  joint swelling[  ];  joint erythema[  ];  joint pain[  ];  back pain[  ];  Heme/Lymph: bruising[  ];  bleeding[  ];  anemia[  ];  Neuro: TIA[  ];  headaches[  ];  stroke[  ];  vertigo[  ];  seizures[  ];   paresthesias[  ];  difficulty walking[  ];  Psych:depression[  ]; anxiety[  ];  Endocrine: diabetes[  ];  thyroid dysfunction[  ];          Physical Exam: BP (!) 139/98 (BP Location: Right Arm)   Pulse (!) 111   Temp 98.8 F (37.1 C) (Oral)   Resp (!) 22   SpO2 98%    General appearance: alert, cooperative, appears stated age and moderate distress Head: Normocephalic, without obvious abnormality, atraumatic Neck: no adenopathy, no carotid bruit, no JVD and supple, symmetrical, trachea midline. There is swelling at the base of the left neck that is most pronounced superior to the [place] joint.  There is  associated tenderness at the base of the neck extending out to the left shoulder.  Lymph nodes: No palpable cervical lymphadenopathy Resp: Breath sounds are clear to auscultation.  Respiratory effort is normal. Cardio: Regular rate and rhythm.  No murmur GI: soft, non-tender; bowel sounds normal; no masses,  no organomegaly Extremities: extremities normal, atraumatic, no cyanosis or edema Neurologic: Grossly normal. He is anxious and has fine hand tremors.  Diagnostic Studies & Laboratory data:     Recent Radiology Findings:   CT Soft Tissue Neck W Contrast  Result Date: 04/15/2019 CLINICAL DATA:  Initial evaluation for neck abscess. EXAM: CT NECK WITH CONTRAST TECHNIQUE: Multidetector CT imaging of the neck was performed using the standard protocol following the bolus administration of intravenous contrast. CONTRAST:  75mL OMNIPAQUE IOHEXOL 300 MG/ML  SOLN COMPARISON:  None. FINDINGS: Pharynx and larynx: Oral cavity within normal limits. Palatine tonsils symmetric and within normal limits. Parapharyngeal fat  maintained. Nasopharynx and oropharynx within normal limits. No retropharyngeal collection. Epiglottis normal. Vallecula clear. Remainder of the hypopharynx and supraglottic larynx within normal limits. True cords symmetric and normal. Subglottic airway clear. Salivary glands: Salivary glands including the parotid and submandibular glands are within normal limits. Thyroid: Normal. Lymph nodes: No pathologically enlarged lymph nodes seen within the neck. Vascular: Normal intravascular enhancement seen throughout the neck. Limited intracranial: Unremarkable. Visualized orbits: Unremarkable. Mastoids and visualized paranasal sinuses: Mild mucosal thickening within the right maxillary sinus. Visualized paranasal sinuses are otherwise clear. Mastoid air cells and middle ear cavities are well pneumatized and free of fluid. Skeleton: No acute osseous abnormality. No discrete or worrisome osseous lesions. Prominent dental caries noted at the left second mandibular molar as well as the right second maxillary molar. Upper chest: Visualized lungs are clear. Other: Extensive soft tissue swelling with inflammatory stranding seen involving the left paramedian soft tissues of the lower anterior neck, just superior to the left sternoclavicular  joint, consistent with cellulitis. Inflammatory stranding extends into the left upper mediastinum. There is a superimposed rim enhancing hypodense collection measuring 3.4 x 2.1 x 2.5 cm, consistent with abscess (series 2, image 102). This is closely positioned to the adjacent left sternoclavicular joint, which could conceivably be infected, although no definite osseous erosive changes are seen to suggest osteomyelitis. Inflammatory stranding with swelling extends inferiorly into the left anterior upper chest wall as well. IMPRESSION: Extensive soft tissue swelling with inflammatory stranding involving the left paramedian soft tissues of the lower anterior neck, just superior to the left  sternoclavicular joint, consistent with cellulitis/infection. Superimposed 3.4 x 2.1 x 2.5 cm collection within this region, consistent with abscess. This is closely positioned to the adjacent left sternoclavicular joint, which could conceivably be infected, although no definite osseous erosive changes are seen to suggest osteomyelitis. Electronically Signed   By: Rise MuBenjamin  McClintock M.D.   On: 04/15/2019 05:44   ECHOCARDIOGRAM COMPLETE  Result Date: 04/15/2019   ECHOCARDIOGRAM REPORT   Patient Name:   Reatha HarpsLOUIS NATHANIEL Michie Date of Exam: 04/15/2019 Medical Rec #:  [id number]              Height:       70.0 in Accession #:    [id number]             Weight:       185.0 lb Date of Birth:  [birth year]             BSA:          2.02 m Patient Age:    33 years               BP:           141/105 mmHg Patient Gender: M                      HR:           104 bpm. Exam Location:  Inpatient Procedure: 2D Echo, Cardiac Doppler and Color Doppler Indications:    Endocarditis I38  History:        Patient has prior history of Echocardiogram examinations, most                 recent 01/08/2019. Signs/Symptoms:Chest Pain; Risk                 Factors:Hypertension and Former Smoker. Sepsis.  Sonographer:    Tonia GhentJulia Underwood RDCS Referring Phys: [id number] Teddy SpikeYRONE A KYLE IMPRESSIONS  1. Left ventricular ejection fraction, by visual estimation, is 55 to 60%. The left ventricle has normal function. There is no left ventricular hypertrophy.  2. The left ventricle has no regional wall motion abnormalities.  3. Global right ventricle has normal systolic function.The right ventricular size is normal. No increase in right ventricular wall thickness.  4. Left atrial size was normal.  5. Right atrial size was normal.  6. The mitral valve is normal in structure. No evidence of mitral valve regurgitation. No evidence of mitral stenosis.  7. The tricuspid valve is normal in structure.  8. The aortic valve is normal in structure. Aortic valve  regurgitation is not visualized. No evidence of aortic valve sclerosis or stenosis.  9. The pulmonic valve was normal in structure. Pulmonic valve regurgitation is not visualized. 10. Normal pulmonary artery systolic pressure. 11. The inferior vena cava is normal in size with greater than 50% respiratory variability, suggesting right atrial pressure of 3 mmHg. 12. No evidence  for endocarditis. FINDINGS  Left Ventricle: Left ventricular ejection fraction, by visual estimation, is 55 to 60%. The left ventricle has normal function. The left ventricle has no regional wall motion abnormalities. There is no left ventricular hypertrophy. Normal left atrial pressure. Right Ventricle: The right ventricular size is normal. No increase in right ventricular wall thickness. Global RV systolic function is has normal systolic function. The tricuspid regurgitant velocity is 1.94 m/s, and with an assumed right atrial pressure  of 3 mmHg, the estimated right ventricular systolic pressure is normal at 18.1 mmHg. Left Atrium: Left atrial size was normal in size. Right Atrium: Right atrial size was normal in size Pericardium: There is no evidence of pericardial effusion. Mitral Valve: The mitral valve is normal in structure. No evidence of mitral valve regurgitation. No evidence of mitral valve stenosis by observation. Tricuspid Valve: The tricuspid valve is normal in structure. Tricuspid valve regurgitation is mild. Aortic Valve: The aortic valve is normal in structure. Aortic valve regurgitation is not visualized. The aortic valve is structurally normal, with no evidence of sclerosis or stenosis. Pulmonic Valve: The pulmonic valve was normal in structure. Pulmonic valve regurgitation is not visualized. Pulmonic regurgitation is not visualized. Aorta: The aortic root, ascending aorta and aortic arch are all structurally normal, with no evidence of dilitation or obstruction. Venous: The inferior vena cava is normal in size with greater  than 50% respiratory variability, suggesting right atrial pressure of 3 mmHg. IAS/Shunts: No atrial level shunt detected by color flow Doppler. There is no evidence of a patent foramen ovale. No ventricular septal defect is seen or detected. There is no evidence of an atrial septal defect.  LEFT VENTRICLE PLAX 2D LVIDd:         5.10 cm       Diastology LVIDs:         3.60 cm       LV e' lateral:   13.20 cm/s LV PW:         0.70 cm       LV E/e' lateral: 6.8 LV IVS:        0.70 cm       LV e' medial:    11.10 cm/s LVOT diam:     1.90 cm       LV E/e' medial:  8.1 LV SV:         69 ml LV SV Index:   33.86 LVOT Area:     2.84 cm  LV Volumes (MOD) LV area d, A2C:    34.90 cm LV area d, A4C:    40.50 cm LV area s, A2C:    25.40 cm LV area s, A4C:    23.40 cm LV major d, A2C:   9.15 cm LV major d, A4C:   9.99 cm LV major s, A2C:   8.15 cm LV major s, A4C:   7.98 cm LV vol d, MOD A2C: 110.0 ml LV vol d, MOD A4C: 135.0 ml LV vol s, MOD A2C: 67.2 ml LV vol s, MOD A4C: 58.4 ml LV SV MOD A2C:     42.8 ml LV SV MOD A4C:     135.0 ml LV SV MOD BP:      63.6 ml RIGHT VENTRICLE RV S prime:     15.40 cm/s TAPSE (M-mode): 2.4 cm LEFT ATRIUM             Index       RIGHT ATRIUM  Index LA diam:        3.60 cm 1.78 cm/m  RA Area:     14.40 cm LA Vol (A2C):   37.9 ml 18.77 ml/m RA Volume:   36.00 ml  17.83 ml/m LA Vol (A4C):   38.9 ml 19.26 ml/m LA Biplane Vol: 42.3 ml 20.95 ml/m  AORTIC VALVE LVOT Vmax:   102.00 cm/s LVOT Vmean:  72.300 cm/s LVOT VTI:    0.170 m  AORTA Ao Root diam: 2.70 cm MITRAL VALVE                        TRICUSPID VALVE MV Area (PHT): 8.43 cm             TR Peak grad:   15.1 mmHg MV PHT:        26.10 msec           TR Vmax:        194.00 cm/s MV Decel Time: 90 msec MV E velocity: 90.30 cm/s 103 cm/s  SHUNTS MV A velocity: 72.70 cm/s 70.3 cm/s Systemic VTI:  0.17 m MV E/A ratio:  1.24       1.5       Systemic Diam: 1.90 cm  Ena Dawley MD Electronically signed by Ena Dawley MD  Signature Date/Time: 04/15/2019/1:08:47 PM    Final      I have independently reviewed the above radiologic studies and discussed with the patient   Recent Lab Findings: Lab Results  Component Value Date   WBC 12.7 (H) 04/14/2019   HGB 12.0 (L) 04/14/2019   HCT 37.7 (L) 04/14/2019   PLT 228 04/14/2019   GLUCOSE 77 04/14/2019   ALT 66 (H) 04/14/2019   AST 64 (H) 04/14/2019   NA 138 04/14/2019   K 4.2 04/14/2019   CL 103 04/14/2019   CREATININE 1.02 04/14/2019   BUN 16 04/14/2019   CO2 25 04/14/2019   TSH 2.390 03/27/2014   INR 1.3 (H) 04/15/2019      Assessment / Plan:    34 year old male with a history of polysubstance abuse admitted to the [facility] ED on 04/14/19 following an apparent heroine overdose.  After awakened in the ED,  he complained of severe pain and swelling at the base of the left neck.  Work-up has included an echocardiogram that shows no evidence of endocarditis and a  CT scan that has been personally reviewed by Dr. Roxy Manns which shows a soft tissue abscess measuring approximately 3.5 x 2 x 2.5 cm at the anterior base of the neck on the left.  There is no radiographic evidence that the abscess involves the sternoclavicular joint and there are no signs of osteomyelitis.  I  spent 20 minutes counseling the patient face to face.   Antony Odea, PA-C 04/16/2019 10:16 AM    I have seen and examined the patient and agree with the assessment and plan as outlined above by Enid Cutter, PA-C  Patient is a 34 year old African-American male with history of polysubstance abuse hospitalized 2 days ago with pain and swelling left side of the neck.  Patient has been afebrile with mild leukocytosis.  I have personally examined the patient and reviewed the CT scan of the soft tissues of the neck.  Patient has swelling involving the inferior aspect of the neck on the left side with tenderness but no fluctuance.  CT scan reveals soft tissue swelling throughout the lower  neck and 3.5 x 2.1  x 2.5 cm area that may represent abscess formation in the soft tissues.  This does not directly involve the sternoclavicular joint although the infection extends close to the joint.  There is no obvious sign of involvement of the joint and there are no signs of osteomyelitis in the clavicular head nor the adjacent manubrium.  There are no indications for surgical drainage of the sternoclavicular joint at this time.  I would not recommend tapping the sternoclavicular joint for diagnostic purposes and will defer decision-making regarding whether or not the neck abscess should be surgically drained to the otolaryngology team.  It is possible that the patient's infection may resolve with intravenous antibiotics, but an aggressive approach including surgical drainage might decrease the likelihood that this infection might ultimately extend into adjacent bony structures.  We would be happy to come to the operating room and assist as needed if intraoperative findings suggest extension of infection into the sternoclavicular joint.  Issues discussed at bedside with the patient.  Need for rehabilitation and change in lifestyle emphasized.  All questions answered.  Please call if we can be of further assistance.   I spent in excess of 30 minutes during the conduct of this hospital encounter and >50% of this time involved direct face-to-face encounter with the patient for counseling and/or coordination of their care.   Purcell Nailslarence H Lafawn Lenoir, MD 04/16/2019 11:06 AM

## 2019-04-16 NOTE — Progress Notes (Signed)
PHARMACY - PHYSICIAN COMMUNICATION CRITICAL VALUE ALERT - BLOOD CULTURE IDENTIFICATION (BCID)  Grant Tapia is an 34 y.o. male who presented to [facility] on 04/14/2019 with a chief complaint of neck pain. He is on vancomycin and zosyn for a neck abscess  Assessment:  Blood cultures show GPC in clusters 1/4 bottles. No BCID available   Current antibiotics: vancomycin and zosyn  Changes to prescribed antibiotics recommended:  -No changes now. Will follow cultures  Harland German, PharmD Clinical Pharmacist **Pharmacist phone directory can now be found on [email or web address] ([email or web address]).  Listed under [facility].

## 2019-04-16 NOTE — Anesthesia Preprocedure Evaluation (Addendum)
Anesthesia Evaluation  Patient identified by MRN, date of birth, ID band Patient awake    Reviewed: Allergy & Precautions, NPO status , Patient's Chart, lab work & pertinent test results  History of Anesthesia Complications Negative for: history of anesthetic complications  Airway Mallampati: I  TM Distance: >3 FB Neck ROM: Full    Dental  (+) Dental Advisory Given   Pulmonary former smoker,  04/15/2019 SARS coronavirus NEG   breath sounds clear to auscultation       Cardiovascular hypertension (no meds),  Rhythm:Regular Rate:Normal  12/2018 ECHO: EF 60-65%, valves OK   Neuro/Psych PSYCHIATRIC DISORDERS negative neurological ROS     GI/Hepatic negative GI ROS, (+)     substance abuse  alcohol use, cocaine use, methamphetamine use and IV drug use,   Endo/Other  negative endocrine ROS  Renal/GU negative Renal ROS     Musculoskeletal  (+) narcotic dependent  Abdominal   Peds  Hematology negative hematology ROS (+)   Anesthesia Other Findings   Reproductive/Obstetrics                            Anesthesia Physical Anesthesia Plan  ASA: III  Anesthesia Plan: General   Post-op Pain Management:    Induction: Intravenous  PONV Risk Score and Plan: 2 and Ondansetron and Dexamethasone  Airway Management Planned: Oral ETT  Additional Equipment:   Intra-op Plan:   Post-operative Plan: Extubation in OR  Informed Consent: I have reviewed the patients History and Physical, chart, labs and discussed the procedure including the risks, benefits and alternatives for the proposed anesthesia with the patient or authorized representative who has indicated his/her understanding and acceptance.     Dental advisory given  Plan Discussed with: CRNA and Surgeon  Anesthesia Plan Comments:        Anesthesia Quick Evaluation

## 2019-04-16 NOTE — Progress Notes (Signed)
MD notified of patient's BP 143/103. Patient asymptomatic. Will continue to monitor.

## 2019-04-16 NOTE — Progress Notes (Signed)
PROGRESS NOTE    Grant Tapia  GOT:[id number] DOB: [birth year] DOA: 04/14/2019 PCP: Patient, No Pcp Per   Brief Narrative:  Grant Tapia is a 34 y.o. male with medical history significant of polysubstance abuse. Reports that for the last month he has had various areas of pain (both joint and skin) moving up his left side. He notes that for the last few days he has had severe left neck pain and swelling. It has not yet hinder him from eat or drinking, but he is concerned it will. He knows of no alleviating factors. Movement or palpation aggravates it. The pain is constant and ranges from throbbing to sharp dependent on rotation of the neck or palpation of the neck. He became concerned after hearing a story from a fellow heroin user and sought out help in the ED.   ED Course: CT of neck shows abscess. ED spoke with ENT who requested a medical admission and transfer to [facility]. He was started on vanc/zosyn. TRH was called for admission.   Assessment & Plan:   Active Problems:   Sepsis ([place])   Neck pain   Neck abscess   Polysubstance abuse ([place])   Abnormal LFTs   HTN (hypertension), benign   Neck abscess Case discussed with ENT who recommended orthopedics Discussed case with orthopedic who recommended cardiothoracic surgery Discussed case with Cardiothoracic surgery, ultimately they saw the patient in consultation and recommended against surgical intervention and referred to ENT for neck abscess drainage versus medical management We will continue IV antibiotics Follow cultures and CBC Follow-up ENT recommendations  Polysubstance abuse Initially patient reported no drug use since December 20, upon further questioning he reported last use 2 nights ago He did report injecting into his right arm, denied injection to his neck Counseled on avoidance and abstinence Need to closely follow cultures if positive will need TTE/TEE  Abnormal LFTs Patient hep C antibody  positive We will check hep C RNA Patient equivocal for hep A IgM-rechecking Recheck CMP in the morning  Hypertension Question whether this is pain driven versus withdrawals Monitor closely Cautious ADministration of antihypertensives on discharge  DVT prophylaxis: Heparin SQ  Code Status: FULL    Code Status Orders  (From admission, onward)         Start     Ordered   04/15/19 0825  Full code  Continuous     04/15/19 0824        Code Status History    Date Active Date Inactive Code Status Order ID Comments User Context   01/08/2019 0540 01/12/2019 1632 Full Code [id number]  Elwyn Reach, MD ED   05/08/2017 0527 05/08/2017 1812 Full Code [id number]  Palumbo, April, MD ED   12/30/2016 0511 01/01/2017 1627 Full Code [id number]  Ivor Costa, MD ED   11/19/2016 0728 11/23/2016 1834 Full Code [id number]  Vashti Hey, MD Inpatient   07/18/2016 2108 07/20/2016 2239 Full Code [id number]  Beverely Low, MD Inpatient   07/15/2016 1352 07/17/2016 2027 Full Code [id number]  Debbe Odea, MD Inpatient   07/15/2016 0415 07/15/2016 1351 Full Code [id number]  Ward, Delice Bison, DO ED   03/27/2014 0255 03/30/2014 1550 Full Code [id number]  Corey Harold, NP Inpatient   Advance Care Planning Activity     Family Communication: None today Disposition Plan:   Remained inpatient continued IV antibiotics monitoring of cultures patient not medically stable or safe for discharge Consults called: Cardiothoracic surgery, ENT Admission status: Inpatient   Consultants:  As above  Procedures:  CT Soft Tissue Neck W Contrast  Result Date: 04/15/2019 CLINICAL DATA:  Initial evaluation for neck abscess. EXAM: CT NECK WITH CONTRAST TECHNIQUE: Multidetector CT imaging of the neck was performed using the standard protocol following the bolus administration of intravenous contrast. CONTRAST:  75mL OMNIPAQUE IOHEXOL 300 MG/ML  SOLN COMPARISON:  None. FINDINGS: Pharynx and larynx: Oral cavity within normal  limits. Palatine tonsils symmetric and within normal limits. Parapharyngeal fat maintained. Nasopharynx and oropharynx within normal limits. No retropharyngeal collection. Epiglottis normal. Vallecula clear. Remainder of the hypopharynx and supraglottic larynx within normal limits. True cords symmetric and normal. Subglottic airway clear. Salivary glands: Salivary glands including the parotid and submandibular glands are within normal limits. Thyroid: Normal. Lymph nodes: No pathologically enlarged lymph nodes seen within the neck. Vascular: Normal intravascular enhancement seen throughout the neck. Limited intracranial: Unremarkable. Visualized orbits: Unremarkable. Mastoids and visualized paranasal sinuses: Mild mucosal thickening within the right maxillary sinus. Visualized paranasal sinuses are otherwise clear. Mastoid air cells and middle ear cavities are well pneumatized and free of fluid. Skeleton: No acute osseous abnormality. No discrete or worrisome osseous lesions. Prominent dental caries noted at the left second mandibular molar as well as the right second maxillary molar. Upper chest: Visualized lungs are clear. Other: Extensive soft tissue swelling with inflammatory stranding seen involving the left paramedian soft tissues of the lower anterior neck, just superior to the left sternoclavicular joint, consistent with cellulitis. Inflammatory stranding extends into the left upper mediastinum. There is a superimposed rim enhancing hypodense collection measuring 3.4 x 2.1 x 2.5 cm, consistent with abscess (series 2, image 102). This is closely positioned to the adjacent left sternoclavicular joint, which could conceivably be infected, although no definite osseous erosive changes are seen to suggest osteomyelitis. Inflammatory stranding with swelling extends inferiorly into the left anterior upper chest wall as well. IMPRESSION: Extensive soft tissue swelling with inflammatory stranding involving the left  paramedian soft tissues of the lower anterior neck, just superior to the left sternoclavicular joint, consistent with cellulitis/infection. Superimposed 3.4 x 2.1 x 2.5 cm collection within this region, consistent with abscess. This is closely positioned to the adjacent left sternoclavicular joint, which could conceivably be infected, although no definite osseous erosive changes are seen to suggest osteomyelitis. Electronically Signed   By: Rise MuBenjamin  McClintock M.D.   On: 04/15/2019 05:44   ECHOCARDIOGRAM COMPLETE  Result Date: 04/15/2019   ECHOCARDIOGRAM REPORT   Patient Name:   Grant HarpsLOUIS NATHANIEL Eno Date of Exam: 04/15/2019 Medical Rec #:  [id number]              Height:       70.0 in Accession #:    [id number]             Weight:       185.0 lb Date of Birth:  [birth year]             BSA:          2.02 m Patient Age:    33 years               BP:           141/105 mmHg Patient Gender: M                      HR:           104 bpm. Exam Location:  Inpatient Procedure: 2D Echo, Cardiac Doppler and Color Doppler Indications:  Endocarditis I38  History:        Patient has prior history of Echocardiogram examinations, most                 recent 01/08/2019. Signs/Symptoms:Chest Pain; Risk                 Factors:Hypertension and Former Smoker. Sepsis.  Sonographer:    Tonia Ghent RDCS Referring Phys: [id number] Teddy Spike IMPRESSIONS  1. Left ventricular ejection fraction, by visual estimation, is 55 to 60%. The left ventricle has normal function. There is no left ventricular hypertrophy.  2. The left ventricle has no regional wall motion abnormalities.  3. Global right ventricle has normal systolic function.The right ventricular size is normal. No increase in right ventricular wall thickness.  4. Left atrial size was normal.  5. Right atrial size was normal.  6. The mitral valve is normal in structure. No evidence of mitral valve regurgitation. No evidence of mitral stenosis.  7. The tricuspid valve is normal in  structure.  8. The aortic valve is normal in structure. Aortic valve regurgitation is not visualized. No evidence of aortic valve sclerosis or stenosis.  9. The pulmonic valve was normal in structure. Pulmonic valve regurgitation is not visualized. 10. Normal pulmonary artery systolic pressure. 11. The inferior vena cava is normal in size with greater than 50% respiratory variability, suggesting right atrial pressure of 3 mmHg. 12. No evidence for endocarditis. FINDINGS  Left Ventricle: Left ventricular ejection fraction, by visual estimation, is 55 to 60%. The left ventricle has normal function. The left ventricle has no regional wall motion abnormalities. There is no left ventricular hypertrophy. Normal left atrial pressure. Right Ventricle: The right ventricular size is normal. No increase in right ventricular wall thickness. Global RV systolic function is has normal systolic function. The tricuspid regurgitant velocity is 1.94 m/s, and with an assumed right atrial pressure  of 3 mmHg, the estimated right ventricular systolic pressure is normal at 18.1 mmHg. Left Atrium: Left atrial size was normal in size. Right Atrium: Right atrial size was normal in size Pericardium: There is no evidence of pericardial effusion. Mitral Valve: The mitral valve is normal in structure. No evidence of mitral valve regurgitation. No evidence of mitral valve stenosis by observation. Tricuspid Valve: The tricuspid valve is normal in structure. Tricuspid valve regurgitation is mild. Aortic Valve: The aortic valve is normal in structure. Aortic valve regurgitation is not visualized. The aortic valve is structurally normal, with no evidence of sclerosis or stenosis. Pulmonic Valve: The pulmonic valve was normal in structure. Pulmonic valve regurgitation is not visualized. Pulmonic regurgitation is not visualized. Aorta: The aortic root, ascending aorta and aortic arch are all structurally normal, with no evidence of dilitation or  obstruction. Venous: The inferior vena cava is normal in size with greater than 50% respiratory variability, suggesting right atrial pressure of 3 mmHg. IAS/Shunts: No atrial level shunt detected by color flow Doppler. There is no evidence of a patent foramen ovale. No ventricular septal defect is seen or detected. There is no evidence of an atrial septal defect.  LEFT VENTRICLE PLAX 2D LVIDd:         5.10 cm       Diastology LVIDs:         3.60 cm       LV e' lateral:   13.20 cm/s LV PW:         0.70 cm       LV E/e' lateral: 6.8  LV IVS:        0.70 cm       LV e' medial:    11.10 cm/s LVOT diam:     1.90 cm       LV E/e' medial:  8.1 LV SV:         69 ml LV SV Index:   33.86 LVOT Area:     2.84 cm  LV Volumes (MOD) LV area d, A2C:    34.90 cm LV area d, A4C:    40.50 cm LV area s, A2C:    25.40 cm LV area s, A4C:    23.40 cm LV major d, A2C:   9.15 cm LV major d, A4C:   9.99 cm LV major s, A2C:   8.15 cm LV major s, A4C:   7.98 cm LV vol d, MOD A2C: 110.0 ml LV vol d, MOD A4C: 135.0 ml LV vol s, MOD A2C: 67.2 ml LV vol s, MOD A4C: 58.4 ml LV SV MOD A2C:     42.8 ml LV SV MOD A4C:     135.0 ml LV SV MOD BP:      63.6 ml RIGHT VENTRICLE RV S prime:     15.40 cm/s TAPSE (M-mode): 2.4 cm LEFT ATRIUM             Index       RIGHT ATRIUM           Index LA diam:        3.60 cm 1.78 cm/m  RA Area:     14.40 cm LA Vol (A2C):   37.9 ml 18.77 ml/m RA Volume:   36.00 ml  17.83 ml/m LA Vol (A4C):   38.9 ml 19.26 ml/m LA Biplane Vol: 42.3 ml 20.95 ml/m  AORTIC VALVE LVOT Vmax:   102.00 cm/s LVOT Vmean:  72.300 cm/s LVOT VTI:    0.170 m  AORTA Ao Root diam: 2.70 cm MITRAL VALVE                        TRICUSPID VALVE MV Area (PHT): 8.43 cm             TR Peak grad:   15.1 mmHg MV PHT:        26.10 msec           TR Vmax:        194.00 cm/s MV Decel Time: 90 msec MV E velocity: 90.30 cm/s 103 cm/s  SHUNTS MV A velocity: 72.70 cm/s 70.3 cm/s Systemic VTI:  0.17 m MV E/A ratio:  1.24       1.5       Systemic Diam: 1.90  cm  Tobias Alexander MD Electronically signed by Tobias Alexander MD Signature Date/Time: 04/15/2019/1:08:47 PM    Final      Antimicrobials:   Zosyn and vancomycin day 2   Subjective: Admitted yesterday no acute changes  Objective: Vitals:   04/16/19 0822 04/16/19 1127 04/16/19 1130 04/16/19 1435  BP: (!) 139/98 (!) 158/102 (!) 146/89 (!) 143/103  Pulse: (!) 111 (!) 117  (!) 108  Resp: (!) 22 20  18   Temp: 98.8 F (37.1 C) 98.6 F (37 C)  98.4 F (36.9 C)  TempSrc: Oral Oral  Oral  SpO2: 98% 96%  100%  Weight:   83.9 kg     Intake/Output Summary (Last 24 hours) at 04/16/2019 1501 Last data filed at 04/16/2019 1610 Gross per 24 hour  Intake 1684.83 ml  Output 2000 ml  Net -315.17 ml   Filed Weights   04/16/19 1130  Weight: 83.9 kg    Examination:  General: 34 y.o. male resting in bed, remains somewhat anxious Eyes: PERRL, normal sclera ENMT: Unchanged nares patent w/o discharge, orophaynx clear, dentition normal, ears w/o discharge/lesions/ulcers, fullness of left inferior neck w/ TTP throughout left side (most severe at sternoclavicular joint) w/ some erythema but no purulence Cardiovascular: tachy, +S1, S2, no m/g/r, equal pulses throughout Respiratory: CTABL, no w/r/r, normal WOB, no stridor pressent GI: BS+, NDNT, no masses noted, no organomegaly noted MSK: No e/c/c Skin: No bruises, ulcerations noted; small petechial-like spots on forehead  Neuro: A&O x 3, no focal deficits Psyc: Appropriate interaction and affect, somewhat anxious but cooperative.     Data Reviewed: I have personally reviewed following labs and imaging studies  CBC: Recent Labs  Lab 04/14/19 2214  WBC 12.7*  NEUTROABS 9.1*  HGB 12.0*  HCT 37.7*  MCV 92.0  PLT 228   Basic Metabolic Panel: Recent Labs  Lab 04/14/19 2214  NA 138  K 4.2  CL 103  CO2 25  GLUCOSE 77  BUN 16  CREATININE 1.02  CALCIUM 8.5*   GFR: Estimated Creatinine Clearance: 108.3 mL/min (by C-G formula based  on SCr of 1.02 mg/dL). Liver Function Tests: Recent Labs  Lab 04/14/19 2214  AST 64*  ALT 66*  ALKPHOS 49  BILITOT 1.1  PROT 7.1  ALBUMIN 3.5   No results for input(s): LIPASE, AMYLASE in the last 168 hours. No results for input(s): AMMONIA in the last 168 hours. Coagulation Profile: Recent Labs  Lab 04/15/19 0846  INR 1.3*   Cardiac Enzymes: No results for input(s): CKTOTAL, CKMB, CKMBINDEX, TROPONINI in the last 168 hours. BNP (last 3 results) No results for input(s): PROBNP in the last 8760 hours. HbA1C: No results for input(s): HGBA1C in the last 72 hours. CBG: No results for input(s): GLUCAP in the last 168 hours. Lipid Profile: No results for input(s): CHOL, HDL, LDLCALC, TRIG, CHOLHDL, LDLDIRECT in the last 72 hours. Thyroid Function Tests: No results for input(s): TSH, T4TOTAL, FREET4, T3FREE, THYROIDAB in the last 72 hours. Anemia Panel: No results for input(s): VITAMINB12, FOLATE, FERRITIN, TIBC, IRON, RETICCTPCT in the last 72 hours. Sepsis Labs: No results for input(s): PROCALCITON, LATICACIDVEN in the last 168 hours.  Recent Results (from the past 240 hour(s))  Respiratory Panel by RT PCR (Flu A&B, Covid) - Nasopharyngeal Swab     Status: None   Collection Time: 04/15/19  7:07 AM   Specimen: Nasopharyngeal Swab  Result Value Ref Range Status   SARS Coronavirus 2 by RT PCR NEGATIVE NEGATIVE Final    Comment: (NOTE) SARS-CoV-2 target nucleic acids are NOT DETECTED. The SARS-CoV-2 RNA is generally detectable in upper respiratoy specimens during the acute phase of infection. The lowest concentration of SARS-CoV-2 viral copies this assay can detect is 131 copies/mL. A negative result does not preclude SARS-Cov-2 infection and should not be used as the sole basis for treatment or other patient management decisions. A negative result may occur with  improper specimen collection/handling, submission of specimen other than nasopharyngeal swab, presence of viral  mutation(s) within the areas targeted by this assay, and inadequate number of viral copies (<131 copies/mL). A negative result must be combined with clinical observations, patient history, and epidemiological information. The expected result is Negative. Fact Sheet for Patients:  https://www.moore.com/ Fact Sheet for Healthcare Providers:  https://www.young.biz/ This test is not  yet ap proved or cleared by the [place] FDA and  has been authorized for detection and/or diagnosis of SARS-CoV-2 by FDA under an Emergency Use Authorization (EUA). This EUA will remain  in effect (meaning this test can be used) for the duration of the COVID-19 declaration under Section 564(b)(1) of the Act, 21 U.S.C. section 360bbb-3(b)(1), unless the authorization is terminated or revoked sooner.    Influenza A by PCR NEGATIVE NEGATIVE Final   Influenza B by PCR NEGATIVE NEGATIVE Final    Comment: (NOTE) The Xpert Xpress SARS-CoV-2/FLU/RSV assay is intended as an aid in  the diagnosis of influenza from Nasopharyngeal swab specimens and  should not be used as a sole basis for treatment. Nasal washings and  aspirates are unacceptable for Xpert Xpress SARS-CoV-2/FLU/RSV  testing. Fact Sheet for Patients: https://www.moore.com/https://www.fda.gov/media/142436/download Fact Sheet for Healthcare Providers: https://www.young.biz/https://www.fda.gov/media/142435/download This test is not yet approved or cleared by the [place] FDA and  has been authorized for detection and/or diagnosis of SARS-CoV-2 by  FDA under an Emergency Use Authorization (EUA). This EUA will remain  in effect (meaning this test can be used) for the duration of the  Covid-19 declaration under Section 564(b)(1) of the Act, 21  U.S.C. section 360bbb-3(b)(1), unless the authorization is  terminated or revoked. Performed at [facility], [address] [place], [place] [address]   Culture, blood (routine x 2)      Status: None (Preliminary result)   Collection Time: 04/15/19  8:25 AM   Specimen: BLOOD RIGHT HAND  Result Value Ref Range Status   Specimen Description   Final    BLOOD RIGHT HAND Performed at [facility], [address] [place], [place] [address]    Special Requests   Final    BOTTLES DRAWN AEROBIC ONLY Blood Culture results may not be optimal due to an excessive volume of blood received in culture bottles Performed at [facility], [address] [place], [place] [address]    Culture   Final    NO GROWTH < 24 HOURS Performed at [facility], [address] [place], [place] [address]    Report Status PENDING  Incomplete  Culture, blood (routine x 2)     Status: None (Preliminary result)   Collection Time: 04/15/19  8:30 AM   Specimen: BLOOD LEFT HAND  Result Value Ref Range Status   Specimen Description   Final    BLOOD LEFT HAND Performed at [facility], [address] [place], [place] [address]    Special Requests   Final    BOTTLES DRAWN AEROBIC ONLY Blood Culture results may not be optimal due to an inadequate volume of blood received in culture bottles Performed at [facility], [address] [place], [place] [address]    Culture   Final    NO GROWTH < 24 HOURS Performed at [facility], [address] [place], [place] [address]    Report Status PENDING  Incomplete  SARS CORONAVIRUS 2 (TAT 6-24 HRS) Nasopharyngeal Nasopharyngeal Swab     Status: None   Collection Time: 04/15/19  8:46 AM   Specimen: Nasopharyngeal Swab  Result Value Ref Range Status   SARS Coronavirus 2 NEGATIVE NEGATIVE Final    Comment: (NOTE) SARS-CoV-2 target nucleic acids are NOT DETECTED. The SARS-CoV-2 RNA is generally detectable in upper and lower respiratory specimens during the acute phase of infection. Negative results do not preclude SARS-CoV-2 infection, do  not rule out co-infections with  other pathogens, and should not be used as the sole basis for treatment or other patient management decisions. Negative results must be combined with clinical observations, patient history, and epidemiological information. The expected result is Negative. Fact Sheet for Patients: HairSlick.no Fact Sheet for Healthcare Providers: quierodirigir.com This test is not yet approved or cleared by the [place] FDA and  has been authorized for detection and/or diagnosis of SARS-CoV-2 by FDA under an Emergency Use Authorization (EUA). This EUA will remain  in effect (meaning this test can be used) for the duration of the COVID-19 declaration under Section 56 4(b)(1) of the Act, 21 U.S.C. section 360bbb-3(b)(1), unless the authorization is terminated or revoked sooner. Performed at [facility], [address] [place], [place] [address]   Culture, blood (routine x 2)     Status: None (Preliminary result)   Collection Time: 04/15/19  7:26 PM   Specimen: BLOOD  Result Value Ref Range Status   Specimen Description BLOOD RIGHT ARM  Final   Special Requests   Final    BOTTLES DRAWN AEROBIC AND ANAEROBIC Blood Culture adequate volume   Culture   Final    NO GROWTH < 12 HOURS Performed at [facility], [address], [place], [place] [address]    Report Status PENDING  Incomplete         Radiology Studies: CT Soft Tissue Neck W Contrast  Result Date: 04/15/2019 CLINICAL DATA:  Initial evaluation for neck abscess. EXAM: CT NECK WITH CONTRAST TECHNIQUE: Multidetector CT imaging of the neck was performed using the standard protocol following the bolus administration of intravenous contrast. CONTRAST:  27mL OMNIPAQUE IOHEXOL 300 MG/ML  SOLN COMPARISON:  None. FINDINGS: Pharynx and larynx: Oral cavity within normal limits. Palatine tonsils symmetric and within normal limits. Parapharyngeal fat maintained. Nasopharynx and  oropharynx within normal limits. No retropharyngeal collection. Epiglottis normal. Vallecula clear. Remainder of the hypopharynx and supraglottic larynx within normal limits. True cords symmetric and normal. Subglottic airway clear. Salivary glands: Salivary glands including the parotid and submandibular glands are within normal limits. Thyroid: Normal. Lymph nodes: No pathologically enlarged lymph nodes seen within the neck. Vascular: Normal intravascular enhancement seen throughout the neck. Limited intracranial: Unremarkable. Visualized orbits: Unremarkable. Mastoids and visualized paranasal sinuses: Mild mucosal thickening within the right maxillary sinus. Visualized paranasal sinuses are otherwise clear. Mastoid air cells and middle ear cavities are well pneumatized and free of fluid. Skeleton: No acute osseous abnormality. No discrete or worrisome osseous lesions. Prominent dental caries noted at the left second mandibular molar as well as the right second maxillary molar. Upper chest: Visualized lungs are clear. Other: Extensive soft tissue swelling with inflammatory stranding seen involving the left paramedian soft tissues of the lower anterior neck, just superior to the left sternoclavicular joint, consistent with cellulitis. Inflammatory stranding extends into the left upper mediastinum. There is a superimposed rim enhancing hypodense collection measuring 3.4 x 2.1 x 2.5 cm, consistent with abscess (series 2, image 102). This is closely positioned to the adjacent left sternoclavicular joint, which could conceivably be infected, although no definite osseous erosive changes are seen to suggest osteomyelitis. Inflammatory stranding with swelling extends inferiorly into the left anterior upper chest wall as well. IMPRESSION: Extensive soft tissue swelling with inflammatory stranding involving the left paramedian soft tissues of the lower anterior neck, just superior to the left sternoclavicular joint,  consistent with cellulitis/infection. Superimposed 3.4 x 2.1 x 2.5 cm collection within this region,  consistent with abscess. This is closely positioned to the adjacent left sternoclavicular joint, which could conceivably be infected, although no definite osseous erosive changes are seen to suggest osteomyelitis. Electronically Signed   By: Rise Mu M.D.   On: 04/15/2019 05:44   ECHOCARDIOGRAM COMPLETE  Result Date: 04/15/2019   ECHOCARDIOGRAM REPORT   Patient Name:   Grant Tapia Date of Exam: 04/15/2019 Medical Rec #:  [id number]              Height:       70.0 in Accession #:    [id number]             Weight:       185.0 lb Date of Birth:  1986-02-20             BSA:          2.02 m Patient Age:    33 years               BP:           141/105 mmHg Patient Gender: M                      HR:           104 bpm. Exam Location:  Inpatient Procedure: 2D Echo, Cardiac Doppler and Color Doppler Indications:    Endocarditis I38  History:        Patient has prior history of Echocardiogram examinations, most                 recent 01/08/2019. Signs/Symptoms:Chest Pain; Risk                 Factors:Hypertension and Former Smoker. Sepsis.  Sonographer:    Tonia Ghent RDCS Referring Phys: [id number] Teddy Spike IMPRESSIONS  1. Left ventricular ejection fraction, by visual estimation, is 55 to 60%. The left ventricle has normal function. There is no left ventricular hypertrophy.  2. The left ventricle has no regional wall motion abnormalities.  3. Global right ventricle has normal systolic function.The right ventricular size is normal. No increase in right ventricular wall thickness.  4. Left atrial size was normal.  5. Right atrial size was normal.  6. The mitral valve is normal in structure. No evidence of mitral valve regurgitation. No evidence of mitral stenosis.  7. The tricuspid valve is normal in structure.  8. The aortic valve is normal in structure. Aortic valve regurgitation is not visualized.  No evidence of aortic valve sclerosis or stenosis.  9. The pulmonic valve was normal in structure. Pulmonic valve regurgitation is not visualized. 10. Normal pulmonary artery systolic pressure. 11. The inferior vena cava is normal in size with greater than 50% respiratory variability, suggesting right atrial pressure of 3 mmHg. 12. No evidence for endocarditis. FINDINGS  Left Ventricle: Left ventricular ejection fraction, by visual estimation, is 55 to 60%. The left ventricle has normal function. The left ventricle has no regional wall motion abnormalities. There is no left ventricular hypertrophy. Normal left atrial pressure. Right Ventricle: The right ventricular size is normal. No increase in right ventricular wall thickness. Global RV systolic function is has normal systolic function. The tricuspid regurgitant velocity is 1.94 m/s, and with an assumed right atrial pressure  of 3 mmHg, the estimated right ventricular systolic pressure is normal at 18.1 mmHg. Left Atrium: Left atrial size was normal in size. Right Atrium: Right atrial size was normal in size Pericardium: There is  no evidence of pericardial effusion. Mitral Valve: The mitral valve is normal in structure. No evidence of mitral valve regurgitation. No evidence of mitral valve stenosis by observation. Tricuspid Valve: The tricuspid valve is normal in structure. Tricuspid valve regurgitation is mild. Aortic Valve: The aortic valve is normal in structure. Aortic valve regurgitation is not visualized. The aortic valve is structurally normal, with no evidence of sclerosis or stenosis. Pulmonic Valve: The pulmonic valve was normal in structure. Pulmonic valve regurgitation is not visualized. Pulmonic regurgitation is not visualized. Aorta: The aortic root, ascending aorta and aortic arch are all structurally normal, with no evidence of dilitation or obstruction. Venous: The inferior vena cava is normal in size with greater than 50% respiratory variability,  suggesting right atrial pressure of 3 mmHg. IAS/Shunts: No atrial level shunt detected by color flow Doppler. There is no evidence of a patent foramen ovale. No ventricular septal defect is seen or detected. There is no evidence of an atrial septal defect.  LEFT VENTRICLE PLAX 2D LVIDd:         5.10 cm       Diastology LVIDs:         3.60 cm       LV e' lateral:   13.20 cm/s LV PW:         0.70 cm       LV E/e' lateral: 6.8 LV IVS:        0.70 cm       LV e' medial:    11.10 cm/s LVOT diam:     1.90 cm       LV E/e' medial:  8.1 LV SV:         69 ml LV SV Index:   33.86 LVOT Area:     2.84 cm  LV Volumes (MOD) LV area d, A2C:    34.90 cm LV area d, A4C:    40.50 cm LV area s, A2C:    25.40 cm LV area s, A4C:    23.40 cm LV major d, A2C:   9.15 cm LV major d, A4C:   9.99 cm LV major s, A2C:   8.15 cm LV major s, A4C:   7.98 cm LV vol d, MOD A2C: 110.0 ml LV vol d, MOD A4C: 135.0 ml LV vol s, MOD A2C: 67.2 ml LV vol s, MOD A4C: 58.4 ml LV SV MOD A2C:     42.8 ml LV SV MOD A4C:     135.0 ml LV SV MOD BP:      63.6 ml RIGHT VENTRICLE RV S prime:     15.40 cm/s TAPSE (M-mode): 2.4 cm LEFT ATRIUM             Index       RIGHT ATRIUM           Index LA diam:        3.60 cm 1.78 cm/m  RA Area:     14.40 cm LA Vol (A2C):   37.9 ml 18.77 ml/m RA Volume:   36.00 ml  17.83 ml/m LA Vol (A4C):   38.9 ml 19.26 ml/m LA Biplane Vol: 42.3 ml 20.95 ml/m  AORTIC VALVE LVOT Vmax:   102.00 cm/s LVOT Vmean:  72.300 cm/s LVOT VTI:    0.170 m  AORTA Ao Root diam: 2.70 cm MITRAL VALVE                        TRICUSPID VALVE MV  Area (PHT): 8.43 cm             TR Peak grad:   15.1 mmHg MV PHT:        26.10 msec           TR Vmax:        194.00 cm/s MV Decel Time: 90 msec MV E velocity: 90.30 cm/s 103 cm/s  SHUNTS MV A velocity: 72.70 cm/s 70.3 cm/s Systemic VTI:  0.17 m MV E/A ratio:  1.24       1.5       Systemic Diam: 1.90 cm  Tobias Alexander MD Electronically signed by Tobias Alexander MD Signature Date/Time: 04/15/2019/1:08:47 PM     Final         Scheduled Meds:  chlordiazePOXIDE  10 mg Oral TID   docusate sodium  100 mg Oral BID   folic acid  1 mg Oral Daily   heparin  5,000 Units Subcutaneous Q8H   LORazepam  0-4 mg Intravenous Q6H   Followed by   Melene Muller ON 04/17/2019] LORazepam  0-4 mg Intravenous Q12H   multivitamin with minerals  1 tablet Oral Daily   thiamine  100 mg Oral Daily   Continuous Infusions:  sodium chloride 75 mL/hr at 04/16/19 0950   piperacillin-tazobactam (ZOSYN)  IV 3.375 g (04/16/19 1346)   vancomycin 1,500 mg (04/16/19 1006)     LOS: 1 day    Time spent: 35 MIN    Burke Keels, MD [facility]  If 7PM-7AM, please contact night-coverage  04/16/2019, 3:01 PM

## 2019-04-17 ENCOUNTER — Inpatient Hospital Stay (HOSPITAL_COMMUNITY): Payer: Medicaid Other | Admitting: Anesthesiology

## 2019-04-17 ENCOUNTER — Encounter (HOSPITAL_COMMUNITY): Payer: Self-pay | Admitting: Internal Medicine

## 2019-04-17 ENCOUNTER — Encounter (HOSPITAL_COMMUNITY)
Admission: EM | Disposition: A | Discharge status: Home / Self Care | Payer: Self-pay | Source: Home / Self Care | Attending: Internal Medicine

## 2019-04-17 HISTORY — PX: INCISION AND DRAINAGE ABSCESS: SHX5864

## 2019-04-17 LAB — CBC WITH DIFFERENTIAL/PLATELET
Abs Immature Granulocytes: 0.03 10*3/uL (ref 0.00–0.07)
Basophils Absolute: 0 10*3/uL (ref 0.0–0.1)
Basophils Relative: 0 %
Eosinophils Absolute: 0.6 10*3/uL — ABNORMAL HIGH (ref 0.0–0.5)
Eosinophils Relative: 6 %
HCT: 36.4 % — ABNORMAL LOW (ref 39.0–52.0)
Hemoglobin: 11.9 g/dL — ABNORMAL LOW (ref 13.0–17.0)
Immature Granulocytes: 0 %
Lymphocytes Relative: 22 %
Lymphs Abs: 2 10*3/uL (ref 0.7–4.0)
MCH: 29.5 pg (ref 26.0–34.0)
MCHC: 32.7 g/dL (ref 30.0–36.0)
MCV: 90.3 fL (ref 80.0–100.0)
Monocytes Absolute: 0.8 10*3/uL (ref 0.1–1.0)
Monocytes Relative: 9 %
Neutro Abs: 5.7 10*3/uL (ref 1.7–7.7)
Neutrophils Relative %: 63 %
Platelets: 255 10*3/uL (ref 150–400)
RBC: 4.03 MIL/uL — ABNORMAL LOW (ref 4.22–5.81)
RDW: 14 % (ref 11.5–15.5)
WBC: 9.1 10*3/uL (ref 4.0–10.5)
nRBC: 0 % (ref 0.0–0.2)

## 2019-04-17 LAB — COMPREHENSIVE METABOLIC PANEL
ALT: 42 U/L (ref 0–44)
AST: 31 U/L (ref 15–41)
Albumin: 2.5 g/dL — ABNORMAL LOW (ref 3.5–5.0)
Alkaline Phosphatase: 41 U/L (ref 38–126)
Anion gap: 10 (ref 5–15)
BUN: 5 mg/dL — ABNORMAL LOW (ref 6–20)
CO2: 23 mmol/L (ref 22–32)
Calcium: 8.4 mg/dL — ABNORMAL LOW (ref 8.9–10.3)
Chloride: 105 mmol/L (ref 98–111)
Creatinine, Ser: 0.71 mg/dL (ref 0.61–1.24)
GFR calc Af Amer: >60 mL/min (ref >60)
GFR calc non Af Amer: >60 mL/min (ref >60)
Glucose, Bld: 106 mg/dL — ABNORMAL HIGH (ref 70–99)
Potassium: 3.7 mmol/L (ref 3.5–5.1)
Sodium: 138 mmol/L (ref 135–145)
Total Bilirubin: 0.5 mg/dL (ref 0.3–1.2)
Total Protein: 6.6 g/dL (ref 6.5–8.1)

## 2019-04-17 SURGERY — INCISION AND DRAINAGE, ABSCESS
Anesthesia: General | Site: Chest | Laterality: Left

## 2019-04-17 MED ORDER — MIDAZOLAM HCL 2 MG/2ML IJ SOLN: 0.5000 mg | Freq: Once | INTRAMUSCULAR | Status: DC | PRN

## 2019-04-17 MED ORDER — DEXAMETHASONE SODIUM PHOSPHATE 10 MG/ML IJ SOLN
INTRAMUSCULAR | Status: DC | PRN
  Administered 2019-04-17: 5 mg via INTRAVENOUS

## 2019-04-17 MED ORDER — DEXMEDETOMIDINE HCL IN NACL 200 MCG/50ML IV SOLN
INTRAVENOUS | Status: AC
  Filled 2019-04-17: qty 50

## 2019-04-17 MED ORDER — PROPOFOL 10 MG/ML IV BOLUS
INTRAVENOUS | Status: DC | PRN
  Administered 2019-04-17: 200 mg via INTRAVENOUS

## 2019-04-17 MED ORDER — OXYCODONE HCL 5 MG PO TABS
5.0000 mg | ORAL_TABLET | ORAL | Status: DC | PRN
  Administered 2019-04-17 – 2019-04-20 (×8): 5 mg via ORAL
  Filled 2019-04-17 (×8): qty 1

## 2019-04-17 MED ORDER — ESMOLOL HCL 100 MG/10ML IV SOLN
INTRAVENOUS | Status: DC | PRN
  Administered 2019-04-17 (×2): 20 ug via INTRAVENOUS

## 2019-04-17 MED ORDER — MEPERIDINE HCL 25 MG/ML IJ SOLN: 6.2500 mg | INTRAMUSCULAR | Status: DC | PRN

## 2019-04-17 MED ORDER — LIDOCAINE-EPINEPHRINE 1 %-1:100000 IJ SOLN
INTRAMUSCULAR | Status: DC | PRN
  Administered 2019-04-17: 20 mL

## 2019-04-17 MED ORDER — MIDAZOLAM HCL 2 MG/2ML IJ SOLN
INTRAMUSCULAR | Status: AC
  Filled 2019-04-17: qty 2

## 2019-04-17 MED ORDER — HYDROMORPHONE HCL 1 MG/ML IJ SOLN
INTRAMUSCULAR | Status: AC
  Filled 2019-04-17: qty 1

## 2019-04-17 MED ORDER — 0.9 % SODIUM CHLORIDE (POUR BTL) OPTIME
TOPICAL | Status: DC | PRN
  Administered 2019-04-17: 1000 mL

## 2019-04-17 MED ORDER — KETAMINE HCL 50 MG/5ML IJ SOSY
PREFILLED_SYRINGE | INTRAMUSCULAR | Status: AC
  Filled 2019-04-17: qty 5

## 2019-04-17 MED ORDER — HYDROMORPHONE HCL 1 MG/ML IJ SOLN
INTRAMUSCULAR | Status: DC | PRN
  Administered 2019-04-17: .5 mg via INTRAVENOUS

## 2019-04-17 MED ORDER — HYDRALAZINE HCL 25 MG PO TABS
25.0000 mg | ORAL_TABLET | Freq: Four times a day (QID) | ORAL | Status: DC | PRN
  Administered 2019-04-17 – 2019-04-19 (×2): 25 mg via ORAL
  Filled 2019-04-17 (×2): qty 1

## 2019-04-17 MED ORDER — STERILE WATER FOR IRRIGATION IR SOLN
Status: DC | PRN
  Administered 2019-04-17: 1000 mL

## 2019-04-17 MED ORDER — ROCURONIUM BROMIDE 100 MG/10ML IV SOLN
INTRAVENOUS | Status: DC | PRN
  Administered 2019-04-17: 40 mg via INTRAVENOUS

## 2019-04-17 MED ORDER — LIDOCAINE 2% (20 MG/ML) 5 ML SYRINGE
INTRAMUSCULAR | Status: AC
  Filled 2019-04-17: qty 5

## 2019-04-17 MED ORDER — PROPOFOL 10 MG/ML IV BOLUS
INTRAVENOUS | Status: AC
  Filled 2019-04-17: qty 40

## 2019-04-17 MED ORDER — LIDOCAINE 2% (20 MG/ML) 5 ML SYRINGE
INTRAMUSCULAR | Status: DC | PRN
  Administered 2019-04-17: 60 mg via INTRAVENOUS

## 2019-04-17 MED ORDER — FENTANYL CITRATE (PF) 250 MCG/5ML IJ SOLN
INTRAMUSCULAR | Status: AC
  Filled 2019-04-17: qty 5

## 2019-04-17 MED ORDER — LACTATED RINGERS IV SOLN: INTRAVENOUS | Status: DC | PRN

## 2019-04-17 MED ORDER — HYDROMORPHONE HCL 1 MG/ML IJ SOLN: 0.2500 mg | INTRAMUSCULAR | Status: DC | PRN

## 2019-04-17 MED ORDER — FENTANYL CITRATE (PF) 100 MCG/2ML IJ SOLN
INTRAMUSCULAR | Status: DC | PRN
  Administered 2019-04-17 (×2): 100 ug via INTRAVENOUS
  Administered 2019-04-17: 50 ug via INTRAVENOUS

## 2019-04-17 MED ORDER — KETAMINE HCL 10 MG/ML IJ SOLN
INTRAMUSCULAR | Status: DC | PRN
  Administered 2019-04-17: 20 mg via INTRAVENOUS
  Administered 2019-04-17: 10 mg via INTRAVENOUS
  Administered 2019-04-17: 20 mg via INTRAVENOUS

## 2019-04-17 MED ORDER — PROMETHAZINE HCL 25 MG/ML IJ SOLN: 6.2500 mg | INTRAMUSCULAR | Status: DC | PRN

## 2019-04-17 MED ORDER — ONDANSETRON HCL 4 MG/2ML IJ SOLN
INTRAMUSCULAR | Status: DC | PRN
  Administered 2019-04-17: 4 mg via INTRAVENOUS

## 2019-04-17 MED ORDER — MIDAZOLAM HCL 5 MG/5ML IJ SOLN
INTRAMUSCULAR | Status: DC | PRN
  Administered 2019-04-17: 2 mg via INTRAVENOUS

## 2019-04-17 MED ORDER — ROCURONIUM BROMIDE 10 MG/ML (PF) SYRINGE
PREFILLED_SYRINGE | INTRAVENOUS | Status: AC
  Filled 2019-04-17: qty 10

## 2019-04-17 MED ORDER — SUGAMMADEX SODIUM 200 MG/2ML IV SOLN
INTRAVENOUS | Status: DC | PRN
  Administered 2019-04-17: 200 mg via INTRAVENOUS

## 2019-04-17 MED ORDER — DEXMEDETOMIDINE HCL 200 MCG/2ML IV SOLN
INTRAVENOUS | Status: DC | PRN
  Administered 2019-04-17 (×5): 8 ug via INTRAVENOUS

## 2019-04-17 MED ORDER — LIDOCAINE-EPINEPHRINE 1 %-1:100000 IJ SOLN
INTRAMUSCULAR | Status: AC
  Filled 2019-04-17: qty 1

## 2019-04-17 SURGICAL SUPPLY — 46 items
ATTRACTOMAT 16X20 MAGNETIC DRP (DRAPES) IMPLANT
BLADE SURG 15 STRL LF DISP TIS (BLADE) IMPLANT
BLADE SURG 15 STRL SS (BLADE) ×2
CANISTER SUCT 3000ML PPV (MISCELLANEOUS) ×3 IMPLANT
CLEANER TIP ELECTROSURG 2X2 (MISCELLANEOUS) ×3 IMPLANT
CONT SPEC 4OZ CLIKSEAL STRL BL (MISCELLANEOUS) ×3 IMPLANT
COVER SURGICAL LIGHT HANDLE (MISCELLANEOUS) IMPLANT
COVER WAND RF STERILE (DRAPES) ×1 IMPLANT
DRAIN CHANNEL 15F RND FF W/TCR (WOUND CARE) IMPLANT
DRAIN PENROSE 1/2X12 LTX STRL (WOUND CARE) ×2 IMPLANT
DRAPE HALF SHEET 40X57 (DRAPES) ×2 IMPLANT
DRAPE INCISE 13X13 STRL (DRAPES) IMPLANT
ELECT COATED BLADE 2.86 ST (ELECTRODE) ×3 IMPLANT
ELECT REM PT RETURN 9FT ADLT (ELECTROSURGICAL) ×3
ELECTRODE REM PT RTRN 9FT ADLT (ELECTROSURGICAL) ×1 IMPLANT
EVACUATOR SILICONE 100CC (DRAIN) IMPLANT
GAUZE 4X4 16PLY RFD (DISPOSABLE) ×3 IMPLANT
GAUZE SPONGE 4X4 12PLY STRL (GAUZE/BANDAGES/DRESSINGS) ×3 IMPLANT
GLOVE SS BIOGEL STRL SZ 7.5 (GLOVE) ×1 IMPLANT
GLOVE SUPERSENSE BIOGEL SZ 7.5 (GLOVE) ×2
GOWN STRL REUS W/ TWL LRG LVL3 (GOWN DISPOSABLE) ×2 IMPLANT
GOWN STRL REUS W/TWL LRG LVL3 (GOWN DISPOSABLE) ×4
KIT BASIN OR (CUSTOM PROCEDURE TRAY) ×3 IMPLANT
KIT TURNOVER KIT B (KITS) ×3 IMPLANT
NDL HYPO 25GX1X1/2 BEV (NEEDLE) IMPLANT
NEEDLE HYPO 25GX1X1/2 BEV (NEEDLE) IMPLANT
NS IRRIG 1000ML POUR BTL (IV SOLUTION) ×3 IMPLANT
PAD ABD 8X10 STRL (GAUZE/BANDAGES/DRESSINGS) ×2 IMPLANT
PAD ARMBOARD 7.5X6 YLW CONV (MISCELLANEOUS) ×6 IMPLANT
PENCIL FOOT CONTROL (ELECTRODE) ×3 IMPLANT
POSITIONER HEAD DONUT 9IN (MISCELLANEOUS) IMPLANT
SOL PREP POV-IOD 4OZ 10% (MISCELLANEOUS) IMPLANT
SPONGE INTESTINAL PEANUT (DISPOSABLE) IMPLANT
STAPLER VISISTAT 35W (STAPLE) ×1 IMPLANT
SUCTION FRAZIER TIP 8 FR DISP (SUCTIONS)
SUCTION TUBE FRAZIER 8FR DISP (SUCTIONS) IMPLANT
SUT CHROMIC 4 0 PS 2 18 (SUTURE) IMPLANT
SUT ETHILON 3 0 PS 1 (SUTURE) ×2 IMPLANT
SUT ETHILON 5 0 P 3 18 (SUTURE)
SUT NYLON ETHILON 5-0 P-3 1X18 (SUTURE) IMPLANT
SUT SILK 2 0 PERMA HAND 18 BK (SUTURE) IMPLANT
TAPE CLOTH SURG 4X10 WHT LF (GAUZE/BANDAGES/DRESSINGS) ×2 IMPLANT
TOWEL GREEN STERILE FF (TOWEL DISPOSABLE) ×3 IMPLANT
TRAY ENT MC OR (CUSTOM PROCEDURE TRAY) ×3 IMPLANT
WATER STERILE IRR 1000ML POUR (IV SOLUTION) ×3 IMPLANT
YANKAUER SUCT BULB TIP NO VENT (SUCTIONS) ×2 IMPLANT

## 2019-04-17 NOTE — Progress Notes (Signed)
Pt's temp was 102.2 at 2130. Tylenol was given; pt was taught to use IS; temp  re-checked at 2220 : 101.7. MD notified. Temp re-checked at 2356: 100.2.

## 2019-04-17 NOTE — Progress Notes (Signed)
PROGRESS NOTE    Grant Tapia  ZHG:[id number] DOB: Dec 18, 1985 DOA: 04/14/2019 PCP: Patient, No Pcp Per   Brief Narrative: As per HPI: 33 y.o.malewith medical history significant ofpolysubstance abuse. Reports that for the last month he has had various areas of pain (both joint and skin) moving up his left side. He notes that for the last few days he has had severe left neck pain and swelling. It has not yet hinder him from eat or drinking, but he is concerned it will. He knows of no alleviating factors. Movement or palpation aggravates it. The pain is constant and ranges from throbbing to sharp dependent on rotation of the neck or palpation of the neck. He became concerned after hearing a story from a fellow heroin user and sought out help in the ED.  ED Course:CT of neck shows abscess. ED spoke with ENT who requested a medical admission and transfer to [facility]. He was started on vanc/zosyn. TRH was called for admission  Patient was seen by ENT, on broad-spectrum antibiotics 1/4: I&D of the neck abscess  Subjective:  S/p OR  this am feels hungry dressing intact on lower neck-upper chest /rt side Non focal. On ra Sleepy from anesthesia.  Assessment & Plan:  Neck abscess in the setting of polysubstance abuse IV drug abuse: Appreciate ENT input.  Status post incision and drainage this morning.  Hemodynamically stable, continue pain control with oral oxycodone, continue broad-spectrum antibiotic with vancomycin and Zosyn pharmacy dosing.  Leukocytosis resolved.  Follow-up on culture.  Blood culture 04/15/19- no growth so far Recent Labs  Lab 04/14/19 2214 04/17/19 0421  WBC 12.7* 9.1   Polysubstance abuse/IV drug abuse: initially patient reported no drug use since December 20, upon further questioning - reported last use 2 nights PTA,reportED injecting into his right arm, denied injection to his neck.  He was counseled on evaluation abstinence.  So far blood culture negative. If  positive will need TTE/ an/or TEE  Abnormal LFTs: Improved.  Hep C antibody positive, checking hep C RNA.  Equivocal for hep A IgM and was negative on recheck.  Hypertension not on any medication likely from withdrawal.  Added as needed hydralazine.  Body mass index is 26.54 kg/m.    DVT prophylaxis:Heparin Code Status: full Family Communication: plan of care discussed with patient at bedside. Disposition Plan: Remains inpatient pending clinical improvement in patient's neck abscess.  Consultants: ENT Procedures: NECK I/d: 1/4  Microbiology:   Antimicrobials: Anti-infectives (From admission, onward)   Start     Dose/Rate Route Frequency Ordered Stop   04/15/19 2330  piperacillin-tazobactam (ZOSYN) IVPB 3.375 g     3.375 g 12.5 mL/hr over 240 Minutes Intravenous Every 8 hours 04/15/19 2319     04/15/19 2330  vancomycin (VANCOREADY) IVPB 1500 mg/300 mL     1,500 mg 150 mL/hr over 120 Minutes Intravenous Every 12 hours 04/15/19 2319     04/15/19 0630  vancomycin (VANCOCIN) IVPB 1000 mg/200 mL premix     1,000 mg 200 mL/hr over 60 Minutes Intravenous  Once 04/15/19 0620 04/15/19 0824   04/15/19 0630  piperacillin-tazobactam (ZOSYN) IVPB 3.375 g     3.375 g 100 mL/hr over 30 Minutes Intravenous  Once 04/15/19 0620 04/15/19 0701       Objective: Vitals:   04/17/19 0410 04/17/19 0813 04/17/19 0821 04/17/19 0853  BP: (!) 132/98 128/83 (!) 135/91 (!) 142/98  Pulse: (!) 105 (!) 113 (!) 114 (!) 109  Resp: 14 (!) 22 (!) 24  Temp: 99.9 F (37.7 C) 97.6 F (36.4 C) 98.3 F (36.8 C) 98.8 F (37.1 C)  TempSrc: Oral   Oral  SpO2: 97% 96% 94% 96%  Weight:        Intake/Output Summary (Last 24 hours) at 04/17/2019 0921 Last data filed at 04/17/2019 0827 Gross per 24 hour  Intake 3111.81 ml  Output 1205 ml  Net 1906.81 ml   Filed Weights   04/16/19 1130  Weight: 83.9 kg   Weight change:   Body mass index is 26.54 kg/m.  Intake/Output from previous day: 01/03 0701 -  01/04 0700 In: 1811.8 [P.O.:840; I.V.:606.7; IV Piggyback:365.2] Out: 1200 [Urine:1200] Intake/Output this shift: Total I/O In: 1300 [I.V.:1300] Out: 5 [Blood:5]  Examination:  General exam: sleepy,NAD, Weak appearing. HEENT:Oral mucosa moist, Ear/Nose WNL grossly, dentition normal. dressing intact on lower neck-upper chest /rt side-has a drain inside. Respiratory system: Diminished at the base,no wheezing or crackles,no use of accessory muscle Cardiovascular system: S1 & S2 +, No JVD,. Gastrointestinal system: Abdomen soft, NT,ND, BS+ Nervous System:Alert, awake, moving extremities and grossly nonfocal Extremities: No edema, distal peripheral pulses palpable.  Skin: No rashes,no icterus. MSK: Normal muscle bulk,tone, power  Medications:  Scheduled Meds: . chlordiazePOXIDE  10 mg Oral TID  . Chlorhexidine Gluconate Cloth  6 each Topical Q0600  . docusate sodium  100 mg Oral BID  . folic acid  1 mg Oral Daily  . heparin  5,000 Units Subcutaneous Q8H  . LORazepam  0-4 mg Intravenous Q6H   Followed by  . LORazepam  0-4 mg Intravenous Q12H  . multivitamin with minerals  1 tablet Oral Daily  . mupirocin ointment  1 application Nasal BID  . thiamine  100 mg Oral Daily   Continuous Infusions: . sodium chloride 75 mL/hr at 04/16/19 0950  . piperacillin-tazobactam (ZOSYN)  IV 3.375 g (04/17/19 0509)  . vancomycin 1,500 mg (04/16/19 2112)    Data Reviewed: I have personally reviewed following labs and imaging studies  CBC: Recent Labs  Lab 04/14/19 2214 04/17/19 0421  WBC 12.7* 9.1  NEUTROABS 9.1* 5.7  HGB 12.0* 11.9*  HCT 37.7* 36.4*  MCV 92.0 90.3  PLT 228 255   Basic Metabolic Panel: Recent Labs  Lab 04/14/19 2214 04/17/19 0421  NA 138 138  K 4.2 3.7  CL 103 105  CO2 25 23  GLUCOSE 77 106*  BUN 16 5*  CREATININE 1.02 0.71  CALCIUM 8.5* 8.4*   GFR: Estimated Creatinine Clearance: 138.1 mL/min (by C-G formula based on SCr of 0.71 mg/dL). Liver Function  Tests: Recent Labs  Lab 04/14/19 2214 04/17/19 0421  AST 64* 31  ALT 66* 42  ALKPHOS 49 41  BILITOT 1.1 0.5  PROT 7.1 6.6  ALBUMIN 3.5 2.5*   No results for input(s): LIPASE, AMYLASE in the last 168 hours. No results for input(s): AMMONIA in the last 168 hours. Coagulation Profile: Recent Labs  Lab 04/15/19 0846  INR 1.3*   Cardiac Enzymes: No results for input(s): CKTOTAL, CKMB, CKMBINDEX, TROPONINI in the last 168 hours. BNP (last 3 results) No results for input(s): PROBNP in the last 8760 hours. HbA1C: No results for input(s): HGBA1C in the last 72 hours. CBG: No results for input(s): GLUCAP in the last 168 hours. Lipid Profile: No results for input(s): CHOL, HDL, LDLCALC, TRIG, CHOLHDL, LDLDIRECT in the last 72 hours. Thyroid Function Tests: No results for input(s): TSH, T4TOTAL, FREET4, T3FREE, THYROIDAB in the last 72 hours. Anemia Panel: No results for input(s): VITAMINB12,  FOLATE, FERRITIN, TIBC, IRON, RETICCTPCT in the last 72 hours. Sepsis Labs: No results for input(s): PROCALCITON, LATICACIDVEN in the last 168 hours.  Recent Results (from the past 240 hour(s))  Respiratory Panel by RT PCR (Flu A&B, Covid) - Nasopharyngeal Swab     Status: None   Collection Time: 04/15/19  7:07 AM   Specimen: Nasopharyngeal Swab  Result Value Ref Range Status   SARS Coronavirus 2 by RT PCR NEGATIVE NEGATIVE Final    Comment: (NOTE) SARS-CoV-2 target nucleic acids are NOT DETECTED. The SARS-CoV-2 RNA is generally detectable in upper respiratoy specimens during the acute phase of infection. The lowest concentration of SARS-CoV-2 viral copies this assay can detect is 131 copies/mL. A negative result does not preclude SARS-Cov-2 infection and should not be used as the sole basis for treatment or other patient management decisions. A negative result may occur with  improper specimen collection/handling, submission of specimen other than nasopharyngeal swab, presence of viral  mutation(s) within the areas targeted by this assay, and inadequate number of viral copies (<131 copies/mL). A negative result must be combined with clinical observations, patient history, and epidemiological information. The expected result is Negative. Fact Sheet for Patients:  https://www.moore.com/https://www.fda.gov/media/142436/download Fact Sheet for Healthcare Providers:  https://www.young.biz/https://www.fda.gov/media/142435/download This test is not yet ap proved or cleared by the [place] FDA and  has been authorized for detection and/or diagnosis of SARS-CoV-2 by FDA under an Emergency Use Authorization (EUA). This EUA will remain  in effect (meaning this test can be used) for the duration of the COVID-19 declaration under Section 564(b)(1) of the Act, 21 U.S.C. section 360bbb-3(b)(1), unless the authorization is terminated or revoked sooner.    Influenza A by PCR NEGATIVE NEGATIVE Final   Influenza B by PCR NEGATIVE NEGATIVE Final    Comment: (NOTE) The Xpert Xpress SARS-CoV-2/FLU/RSV assay is intended as an aid in  the diagnosis of influenza from Nasopharyngeal swab specimens and  should not be used as a sole basis for treatment. Nasal washings and  aspirates are unacceptable for Xpert Xpress SARS-CoV-2/FLU/RSV  testing. Fact Sheet for Patients: https://www.moore.com/https://www.fda.gov/media/142436/download Fact Sheet for Healthcare Providers: https://www.young.biz/https://www.fda.gov/media/142435/download This test is not yet approved or cleared by the [place] FDA and  has been authorized for detection and/or diagnosis of SARS-CoV-2 by  FDA under an Emergency Use Authorization (EUA). This EUA will remain  in effect (meaning this test can be used) for the duration of the  Covid-19 declaration under Section 564(b)(1) of the Act, 21  U.S.C. section 360bbb-3(b)(1), unless the authorization is  terminated or revoked. Performed at [facility], [address] [place], [place] [address]   Culture, blood (routine x 2)      Status: None (Preliminary result)   Collection Time: 04/15/19  8:25 AM   Specimen: BLOOD RIGHT HAND  Result Value Ref Range Status   Specimen Description   Final    BLOOD RIGHT HAND Performed at [facility], [address] [place], [place] [address]    Special Requests   Final    BOTTLES DRAWN AEROBIC ONLY Blood Culture results may not be optimal due to an excessive volume of blood received in culture bottles Performed at [facility], [address] [place], [place] [address]    Culture   Final    NO GROWTH < 24 HOURS Performed at [facility], [address] [place], [place] [address]    Report Status PENDING  Incomplete  Culture, blood (routine x  2)     Status: None (Preliminary result)   Collection Time: 04/15/19  8:30 AM   Specimen: BLOOD LEFT HAND  Result Value Ref Range Status   Specimen Description   Final    BLOOD LEFT HAND Performed at [facility], [address], [place], [place] [address]    Special Requests   Final    BOTTLES DRAWN AEROBIC ONLY Blood Culture results may not be optimal due to an inadequate volume of blood received in culture bottles Performed at [facility], [address], [place], [place] [address]    Culture   Final    NO GROWTH < 24 HOURS Performed at [facility], [address] [place], [place] [address]    Report Status PENDING  Incomplete  SARS CORONAVIRUS 2 (TAT 6-24 HRS) Nasopharyngeal Nasopharyngeal Swab     Status: None   Collection Time: 04/15/19  8:46 AM   Specimen: Nasopharyngeal Swab  Result Value Ref Range Status   SARS Coronavirus 2 NEGATIVE NEGATIVE Final    Comment: (NOTE) SARS-CoV-2 target nucleic acids are NOT DETECTED. The SARS-CoV-2 RNA is generally detectable in upper and lower respiratory specimens during the acute phase of infection. Negative results do not preclude SARS-CoV-2 infection, do not rule out co-infections with  other pathogens, and should not be used as the sole basis for treatment or other patient management decisions. Negative results must be combined with clinical observations, patient history, and epidemiological information. The expected result is Negative. Fact Sheet for Patients: HairSlick.no Fact Sheet for Healthcare Providers: quierodirigir.com This test is not yet approved or cleared by the [place] FDA and  has been authorized for detection and/or diagnosis of SARS-CoV-2 by FDA under an Emergency Use Authorization (EUA). This EUA will remain  in effect (meaning this test can be used) for the duration of the COVID-19 declaration under Section 56 4(b)(1) of the Act, 21 U.S.C. section 360bbb-3(b)(1), unless the authorization is terminated or revoked sooner. Performed at [facility], [address] [place], [place] [address]   Culture, blood (routine x 2)     Status: None (Preliminary result)   Collection Time: 04/15/19  7:26 PM   Specimen: BLOOD  Result Value Ref Range Status   Specimen Description BLOOD RIGHT ARM  Final   Special Requests   Final    BOTTLES DRAWN AEROBIC AND ANAEROBIC Blood Culture adequate volume   Culture  Setup Time   Final    GRAM POSITIVE COCCI IN BOTH AEROBIC AND ANAEROBIC BOTTLES CRITICAL RESULT CALLED TO, READ BACK BY AND VERIFIED WITH: A. MEYER, PHARMD AT 1730 ON 04/16/19 BY C. JESSUP, MT. Performed at [facility], [address], [place], [place] [address]    Culture [facility] POSITIVE COCCI  Final   Report Status PENDING  Incomplete  Surgical pcr screen     Status: Abnormal   Collection Time: 04/16/19  6:09 PM   Specimen: Nasal Mucosa; Nasal Swab  Result Value Ref Range Status   MRSA, PCR POSITIVE (A) NEGATIVE Final    Comment: RESULT CALLED TO, READ BACK BY AND VERIFIED WITH: RAKHIMOVA,R RN 2206 04/16/2019 MITCHELL,L    Staphylococcus aureus POSITIVE (A) NEGATIVE Final    Comment:  (NOTE) The Xpert SA Assay (FDA approved for NASAL specimens in patients 92 years of age and older), is one component of a comprehensive surveillance program. It is not intended to diagnose infection nor to guide or monitor treatment. Performed at [facility], [address], [place], [place] [address]       Radiology Studies: ECHOCARDIOGRAM COMPLETE  Result Date: 04/15/2019   ECHOCARDIOGRAM REPORT   Patient Name:   DIEZEL MAZUR Date of Exam: 04/15/2019 Medical Rec #:  [id number]              Height:       70.0 in Accession #:    [id number]             Weight:       185.0 lb Date of Birth:  Dec 24, 1985             BSA:          2.02 m Patient Age:    33 years               BP:           141/105 mmHg Patient Gender: M                      HR:           104 bpm. Exam Location:  Inpatient Procedure: 2D Echo, Cardiac Doppler and Color Doppler Indications:    Endocarditis I38  History:        Patient has prior history of Echocardiogram examinations, most                 recent 01/08/2019. Signs/Symptoms:Chest Pain; Risk                 Factors:Hypertension and Former Smoker. Sepsis.  Sonographer:    Tonia Ghent RDCS Referring Phys: [id number] Teddy Spike IMPRESSIONS  1. Left ventricular ejection fraction, by visual estimation, is 55 to 60%. The left ventricle has normal function. There is no left ventricular hypertrophy.  2. The left ventricle has no regional wall motion abnormalities.  3. Global right ventricle has normal systolic function.The right ventricular size is normal. No increase in right ventricular wall thickness.  4. Left atrial size was normal.  5. Right atrial size was normal.  6. The mitral valve is normal in structure. No evidence of mitral valve regurgitation. No evidence of mitral stenosis.  7. The tricuspid valve is normal in structure.  8. The aortic valve is normal in structure. Aortic valve regurgitation is not visualized. No evidence of aortic valve sclerosis or  stenosis.  9. The pulmonic valve was normal in structure. Pulmonic valve regurgitation is not visualized. 10. Normal pulmonary artery systolic pressure. 11. The inferior vena cava is normal in size with greater than 50% respiratory variability, suggesting right atrial pressure of 3 mmHg. 12. No evidence for endocarditis. FINDINGS  Left Ventricle: Left ventricular ejection fraction, by visual estimation, is 55 to 60%. The left ventricle has normal function. The left ventricle has no regional wall motion abnormalities. There is no left ventricular hypertrophy. Normal left atrial pressure. Right Ventricle: The right ventricular size is normal. No increase in right ventricular wall thickness. Global RV systolic function is has normal systolic function. The tricuspid regurgitant velocity is 1.94 m/s, and with an assumed right atrial pressure  of 3 mmHg, the estimated right ventricular systolic pressure is normal at 18.1 mmHg. Left Atrium: Left atrial size was normal in size. Right Atrium: Right atrial size was normal in size Pericardium: There is no evidence of pericardial effusion. Mitral Valve: The mitral valve is normal in structure. No evidence of mitral valve regurgitation. No evidence of mitral valve stenosis by  observation. Tricuspid Valve: The tricuspid valve is normal in structure. Tricuspid valve regurgitation is mild. Aortic Valve: The aortic valve is normal in structure. Aortic valve regurgitation is not visualized. The aortic valve is structurally normal, with no evidence of sclerosis or stenosis. Pulmonic Valve: The pulmonic valve was normal in structure. Pulmonic valve regurgitation is not visualized. Pulmonic regurgitation is not visualized. Aorta: The aortic root, ascending aorta and aortic arch are all structurally normal, with no evidence of dilitation or obstruction. Venous: The inferior vena cava is normal in size with greater than 50% respiratory variability, suggesting right atrial pressure of 3  mmHg. IAS/Shunts: No atrial level shunt detected by color flow Doppler. There is no evidence of a patent foramen ovale. No ventricular septal defect is seen or detected. There is no evidence of an atrial septal defect.  LEFT VENTRICLE PLAX 2D LVIDd:         5.10 cm       Diastology LVIDs:         3.60 cm       LV e' lateral:   13.20 cm/s LV PW:         0.70 cm       LV E/e' lateral: 6.8 LV IVS:        0.70 cm       LV e' medial:    11.10 cm/s LVOT diam:     1.90 cm       LV E/e' medial:  8.1 LV SV:         69 ml LV SV Index:   33.86 LVOT Area:     2.84 cm  LV Volumes (MOD) LV area d, A2C:    34.90 cm LV area d, A4C:    40.50 cm LV area s, A2C:    25.40 cm LV area s, A4C:    23.40 cm LV major d, A2C:   9.15 cm LV major d, A4C:   9.99 cm LV major s, A2C:   8.15 cm LV major s, A4C:   7.98 cm LV vol d, MOD A2C: 110.0 ml LV vol d, MOD A4C: 135.0 ml LV vol s, MOD A2C: 67.2 ml LV vol s, MOD A4C: 58.4 ml LV SV MOD A2C:     42.8 ml LV SV MOD A4C:     135.0 ml LV SV MOD BP:      63.6 ml RIGHT VENTRICLE RV S prime:     15.40 cm/s TAPSE (M-mode): 2.4 cm LEFT ATRIUM             Index       RIGHT ATRIUM           Index LA diam:        3.60 cm 1.78 cm/m  RA Area:     14.40 cm LA Vol (A2C):   37.9 ml 18.77 ml/m RA Volume:   36.00 ml  17.83 ml/m LA Vol (A4C):   38.9 ml 19.26 ml/m LA Biplane Vol: 42.3 ml 20.95 ml/m  AORTIC VALVE LVOT Vmax:   102.00 cm/s LVOT Vmean:  72.300 cm/s LVOT VTI:    0.170 m  AORTA Ao Root diam: 2.70 cm MITRAL VALVE                        TRICUSPID VALVE MV Area (PHT): 8.43 cm             TR Peak grad:   15.1 mmHg MV PHT:  26.10 msec           TR Vmax:        194.00 cm/s MV Decel Time: 90 msec MV E velocity: 90.30 cm/s 103 cm/s  SHUNTS MV A velocity: 72.70 cm/s 70.3 cm/s Systemic VTI:  0.17 m MV E/A ratio:  1.24       1.5       Systemic Diam: 1.90 cm  Ena Dawley MD Electronically signed by Ena Dawley MD Signature Date/Time: 04/15/2019/1:08:47 PM    Final       LOS: 2 days   Time  spent: More than 50% of that time was spent in counseling and/or coordination of care.  Antonieta Pert, MD [facility]  04/17/2019, 9:21 AM

## 2019-04-17 NOTE — Anesthesia Postprocedure Evaluation (Signed)
Anesthesia Post Note  Patient: Grant Tapia  Procedure(s) Performed: INCISION AND DRAINAGE NECK ABSCESS (Left Chest)     Patient location during evaluation: PACU Anesthesia Type: General Level of consciousness: awake and alert, patient cooperative and oriented Pain management: pain level controlled Vital Signs Assessment: post-procedure vital signs reviewed and stable Respiratory status: spontaneous breathing, nonlabored ventilation and respiratory function stable Cardiovascular status: blood pressure returned to baseline and stable Postop Assessment: no apparent nausea or vomiting Anesthetic complications: no    Last Vitals:  Vitals:   04/17/19 0813 04/17/19 0821  BP: 128/83   Pulse: (!) 113 (!) 114  Resp: (!) 22 (!) 24  Temp: 36.4 C   SpO2: 96% 94%    Last Pain:  Vitals:   04/17/19 0813  TempSrc:   PainSc: Asleep                 Felix Meras,E. Beata Beason

## 2019-04-17 NOTE — Progress Notes (Signed)
Dressing CDI, with @ 5cms of pink show on dressing. Has requested nothing for pain and does not appear uncomfortable.

## 2019-04-17 NOTE — Transfer of Care (Signed)
Immediate Anesthesia Transfer of Care Note  Patient: Grant Tapia  Procedure(s) Performed: INCISION AND DRAINAGE NECK ABSCESS (Left Chest)  Patient Location: PACU  Anesthesia Type:General  Level of Consciousness: drowsy  Airway & Oxygen Therapy: Patient Spontanous Breathing and Patient connected to face mask oxygen  Post-op Assessment: Report given to RN and Post -op Vital signs reviewed and stable  Post vital signs: Reviewed and stable  Last Vitals:  Vitals Value Taken Time  BP 135/91 04/17/19 0821  Temp 36.4 C 04/17/19 0813  Pulse 112 04/17/19 0827  Resp 29 04/17/19 0827  SpO2 94 % 04/17/19 0827  Vitals shown include unvalidated device data.  Last Pain:  Vitals:   04/17/19 0813  TempSrc:   PainSc: Asleep         Complications: No apparent anesthesia complications

## 2019-04-17 NOTE — Interval H&P Note (Signed)
History and Physical Interval Note:  04/17/2019 7:17 AM  Grant Tapia  has presented today for surgery, with the diagnosis of neck abscess.  The various methods of treatment have been discussed with the patient and family. After consideration of risks, benefits and other options for treatment, the patient has consented to  Procedure(s): INCISION AND DRAINAGE NECK ABSCESS (Left) as a surgical intervention.  The patient's history has been reviewed, patient examined, no change in status, stable for surgery.  I have reviewed the patient's chart and labs.  Questions were answered to the patient's satisfaction.     [name]

## 2019-04-17 NOTE — Anesthesia Procedure Notes (Signed)
Procedure Name: Intubation Date/Time: 04/17/2019 7:45 AM Performed by: Marny Lowenstein, CRNA Pre-anesthesia Checklist: Patient identified, Emergency Drugs available, Suction available and Patient being monitored Patient Re-evaluated:Patient Re-evaluated prior to induction Oxygen Delivery Method: Circle system utilized Preoxygenation: Pre-oxygenation with 100% oxygen Induction Type: IV induction Ventilation: Mask ventilation without difficulty Laryngoscope Size: Miller and 2 Grade View: Grade I Tube type: Oral Tube size: 7.0 mm Number of attempts: 1 Airway Equipment and Method: Stylet Placement Confirmation: ETT inserted through vocal cords under direct vision,  positive ETCO2 and breath sounds checked- equal and bilateral Secured at: 23 cm Tube secured with: Tape Dental Injury: Teeth and Oropharynx as per pre-operative assessment

## 2019-04-17 NOTE — Social Work (Signed)
CSW acknowledging consult for substance use. Will evaluate pt needs and interest in resources.  Pt without listed PCP-added [facility] information to pt instructions.   Grant Tapia, MSW, LCSWA [place] [facility]

## 2019-04-17 NOTE — Progress Notes (Signed)
Pt is being transferred to OR. Report is given to [facility] short stay

## 2019-04-17 NOTE — Op Note (Signed)
Preop/postop diagnosis: Lower neck abscess Procedure: Incision and drainage of lower neck abscess Anesthesia: General Estimated blood loss: Approximately 10 cc Indications: 34 year old with a IV drug abuse history and developed a swelling in the lower neck that was consistent with an abscess on CT scan.  He was treated with intravenous antibiotics and it did not respond well with continued pain and swelling so patient was brought to the operating room.  He was informed the risk and benefits of the procedure and options were discussed all questions were answered and consent was obtained. Procedure: Patient was taken the operating placed supine position after general endotracheal tube anesthesia was prepped and draped in the usual sterile manner.  The incision was made over the area of the sternocleidomastoid clavicular joint and dissected down with electrocautery through the subcutaneous tissue.  A hemostat was then used to open the area which immediately had significant amount of purulence expressed.  It was opened with finger to dissect the cavity which did go inferior down onto the anterior chest wall as well as into the area of the sternal notch.  He did have a limitation of of dissectible blunt area.  The cavity was well-defined.  The cavity was irrigated with saline and then a Penrose drain placed and secured with 3-0 nylon.  Cultures were taken.  Patient was awakened brought recovery in stable condition counts correct

## 2019-04-17 NOTE — Progress Notes (Signed)
Per MD pt can be off the telemetry for transferring to OR.

## 2019-04-18 LAB — CULTURE, BLOOD (ROUTINE X 2): Special Requests: ADEQUATE

## 2019-04-18 MED ORDER — KETOROLAC TROMETHAMINE 30 MG/ML IJ SOLN
30.0000 mg | Freq: Four times a day (QID) | INTRAMUSCULAR | Status: DC | PRN
  Administered 2019-04-18 – 2019-04-20 (×4): 30 mg via INTRAVENOUS
  Filled 2019-04-18 (×4): qty 1

## 2019-04-18 NOTE — Progress Notes (Signed)
1 Day Post-Op   Subjective/Chief Complaint: Feeling much better.  Now can turn his head without any pain.   Objective: Vital signs in last 24 hours: Temp:  [98.5 F (36.9 C)-99.4 F (37.4 C)] 98.7 F (37.1 C) (01/05 0534) Pulse Rate:  [81-110] 86 (01/05 0534) Resp:  [18-19] 18 (01/05 0534) BP: (141-157)/(68-109) 143/93 (01/05 0534) SpO2:  [97 %-100 %] 99 % (01/05 0534) Last BM Date: 04/15/19  Intake/Output from previous day: 01/04 0701 - 01/05 0700 In: 1760 [P.O.:460; I.V.:1300] Out: 1205 [Urine:1200; Blood:5] Intake/Output this shift: No intake/output data recorded.  The wound looks good with decreased swelling and erythema.  The drain is still in place.  There is good drainage still coming from the wound.  Lab Results:  Recent Labs    04/17/19 0421  WBC 9.1  HGB 11.9*  HCT 36.4*  PLT 255   BMET Recent Labs    04/17/19 0421  NA 138  K 3.7  CL 105  CO2 23  GLUCOSE 106*  BUN 5*  CREATININE 0.71  CALCIUM 8.4*   PT/INR No results for input(s): LABPROT, INR in the last 72 hours. ABG No results for input(s): PHART, HCO3 in the last 72 hours.  Invalid input(s): PCO2, PO2  Studies/Results: No results found.  Anti-infectives: Anti-infectives (From admission, onward)   Start     Dose/Rate Route Frequency Ordered Stop   04/15/19 2330  piperacillin-tazobactam (ZOSYN) IVPB 3.375 g     3.375 g 12.5 mL/hr over 240 Minutes Intravenous Every 8 hours 04/15/19 2319     04/15/19 2330  vancomycin (VANCOREADY) IVPB 1500 mg/300 mL     1,500 mg 150 mL/hr over 120 Minutes Intravenous Every 12 hours 04/15/19 2319     04/15/19 0630  vancomycin (VANCOCIN) IVPB 1000 mg/200 mL premix     1,000 mg 200 mL/hr over 60 Minutes Intravenous  Once 04/15/19 0620 04/15/19 0824   04/15/19 0630  piperacillin-tazobactam (ZOSYN) IVPB 3.375 g     3.375 g 100 mL/hr over 30 Minutes Intravenous  Once 04/15/19 2426 04/15/19 0701      Assessment/Plan: s/p Procedure(s): INCISION AND  DRAINAGE NECK ABSCESS (Left) He has significant improvement from yesterday's incision and drainage.  The drain will stay in today and hopefully removed tomorrow.  Culture was taken in the operating room.  Hopefully he would be able to go home tomorrow.  LOS: 3 days    [name] 04/18/2019

## 2019-04-18 NOTE — Plan of Care (Signed)

## 2019-04-18 NOTE — Progress Notes (Signed)
PROGRESS NOTE    Grant Tapia  [id number] DOB: [birth year] DOA: 04/14/2019 PCP: Patient, No Pcp Per   Brief Narrative: As per HPI: 33 y.o.malewith medical history significant ofpolysubstance abuse. Reports that for the last month he has had various areas of pain (both joint and skin) moving up his left side. He notes that for the last few days he has had severe left neck pain and swelling. It has not yet hinder him from eat or drinking, but he is concerned it will. He knows of no alleviating factors. Movement or palpation aggravates it. The pain is constant and ranges from throbbing to sharp dependent on rotation of the neck or palpation of the neck. He became concerned after hearing a story from a fellow heroin user and sought out help in the ED.  ED Course:CT of neck shows abscess. ED spoke with ENT who requested a medical admission and [facility]. He was started on vanc/zosyn. [facility] was called for admission  Patient was seen by ENT, on broad-spectrum antibiotics 1/4: I&D of the neck abscess  Subjective:  Dressing changed this am pendrose in place and draining Pain not well controlled he says  Assessment & Plan:  Neck abscess in the setting of polysubstance abuse IV drug abuse: Appreciate ENT input.  Status post incision and drainage this morning.  Hemodynamically stable, continue pain control with oral oxycodone, add IV Toradol continue broad-spectrum antibiotic with vancomycin - discontinue Zosyn as wound is growing Staphylococcus.  Pharmacy dosing.  Leukocytosis resolved.   Recent Labs  Lab 04/14/19 2214 04/17/19 0421  WBC 12.7* 9.1   STAPHYLOCOCCUS SPECIES (COAGULASE NEGATIVE) in 1 blood culture, likely contamination.  Patient antibiotics as above.  Rest of the blood culture no growth so far.  Polysubstance abuse/IV drug abuse: initially patient reported no drug use since December 20, upon further questioning - reported last use 2 nights PTA,reportED injecting  into his right arm, denied injection to his neck.  He was counseled on evaluation abstinence.  So far blood culture negative. If positive will need TTE/ an/or TEE  Abnormal LFTs: Improved.  Hep C antibody positive, checking hep C RNA.  Equivocal for hep A IgM and was negative on recheck.  Hypertension not on any medication likely from withdrawal.  Added as needed hydralazine.  Add Amlodipine 2.5 mg  Body mass index is 26.54 kg/m.    DVT prophylaxis:Heparin Code Status: full Family Communication: plan of care discussed with patient at bedside. Disposition Plan: Remains inpatient pending clinical improvement in patient's neck abscess.  Consultants: ENT Procedures: NECK I/d: 1/4  Microbiology: wound w/ staph and blood culture w/ coug neg staph   Antimicrobials: Anti-infectives (From admission, onward)   Start     Dose/Rate Route Frequency Ordered Stop   04/15/19 2330  piperacillin-tazobactam (ZOSYN) IVPB 3.375 g     3.375 g 12.5 mL/hr over 240 Minutes Intravenous Every 8 hours 04/15/19 2319     04/15/19 2330  vancomycin (VANCOREADY) IVPB 1500 mg/300 mL     1,500 mg 150 mL/hr over 120 Minutes Intravenous Every 12 hours 04/15/19 2319     04/15/19 0630  vancomycin (VANCOCIN) IVPB 1000 mg/200 mL premix     1,000 mg 200 mL/hr over 60 Minutes Intravenous  Once 04/15/19 0620 04/15/19 0824   04/15/19 0630  piperacillin-tazobactam (ZOSYN) IVPB 3.375 g     3.375 g 100 mL/hr over 30 Minutes Intravenous  Once 04/15/19 0620 04/15/19 0701       Objective: Vitals:  04/17/19 1501 04/17/19 2115 04/18/19 0106 04/18/19 0534  BP: (!) 157/109 (!) 152/101 (!) 141/98 (!) 143/93  Pulse: (!) 109 (!) 110 83 86  Resp: 18 18 19 18   Temp: 98.5 F (36.9 C) 99.4 F (37.4 C) 98.8 F (37.1 C) 98.7 F (37.1 C)  TempSrc: Oral Oral Oral Oral  SpO2: 98% 99% 99% 99%  Weight:        Intake/Output Summary (Last 24 hours) at 04/18/2019 1056 Last data filed at 04/18/2019 0538 Gross per 24 hour  Intake 460  ml  Output 1200 ml  Net -740 ml   Filed Weights   04/16/19 1130  Weight: 83.9 kg   Weight change:   Body mass index is 26.54 kg/m.  Intake/Output from previous day: 01/04 0701 - 01/05 0700 In: 1760 [P.O.:460; I.V.:1300] Out: 1205 [Urine:1200; Blood:5] Intake/Output this shift: No intake/output data recorded.  Examination: General exam: AAO,NAD, weak/frail HEENT:Oral mucosa moist, Ear/Nose WNL grossly, dentition normal.  Dressing present in the upper chest and lower neck.  Respiratory system: Diminished at the base,no wheezing or crackles, NT,no use of accessory muscle Cardiovascular system: S1 & S2 +, No JVD, regular RR. Gastrointestinal system: Abdomen soft, NT,ND, BS+ Nervous System:Alert, awake, moving extremities and grossly nonfocal Extremities: No edema, distal peripheral pulses palpable.  Skin: No rashes,no icterus. MSK: Normal muscle bulk,tone, power Medications:  Scheduled Meds:  chlordiazePOXIDE  10 mg Oral TID   Chlorhexidine Gluconate Cloth  6 each Topical Q0600   docusate sodium  100 mg Oral BID   folic acid  1 mg Oral Daily   heparin  5,000 Units Subcutaneous Q8H   LORazepam  0-4 mg Intravenous Q12H   multivitamin with minerals  1 tablet Oral Daily   mupirocin ointment  1 application Nasal BID   thiamine  100 mg Oral Daily   Continuous Infusions:  sodium chloride 75 mL/hr at 04/16/19 0950   piperacillin-tazobactam (ZOSYN)  IV 3.375 g (04/18/19 0549)   vancomycin 1,500 mg (04/18/19 0216)    Data Reviewed: I have personally reviewed following labs and imaging studies  CBC: Recent Labs  Lab 04/14/19 2214 04/17/19 0421  WBC 12.7* 9.1  NEUTROABS 9.1* 5.7  HGB 12.0* 11.9*  HCT 37.7* 36.4*  MCV 92.0 90.3  PLT 228 255   Basic Metabolic Panel: Recent Labs  Lab 04/14/19 2214 04/17/19 0421  NA 138 138  K 4.2 3.7  CL 103 105  CO2 25 23  GLUCOSE 77 106*  BUN 16 5*  CREATININE 1.02 0.71  CALCIUM 8.5* 8.4*   GFR: Estimated  Creatinine Clearance: 138.1 mL/min (by C-G formula based on SCr of 0.71 mg/dL). Liver Function Tests: Recent Labs  Lab 04/14/19 2214 04/17/19 0421  AST 64* 31  ALT 66* 42  ALKPHOS 49 41  BILITOT 1.1 0.5  PROT 7.1 6.6  ALBUMIN 3.5 2.5*   No results for input(s): LIPASE, AMYLASE in the last 168 hours. No results for input(s): AMMONIA in the last 168 hours. Coagulation Profile: Recent Labs  Lab 04/15/19 0846  INR 1.3*   Cardiac Enzymes: No results for input(s): CKTOTAL, CKMB, CKMBINDEX, TROPONINI in the last 168 hours. BNP (last 3 results) No results for input(s): PROBNP in the last 8760 hours. HbA1C: No results for input(s): HGBA1C in the last 72 hours. CBG: No results for input(s): GLUCAP in the last 168 hours. Lipid Profile: No results for input(s): CHOL, HDL, LDLCALC, TRIG, CHOLHDL, LDLDIRECT in the last 72 hours. Thyroid Function Tests: No results for input(s): TSH,  T4TOTAL, FREET4, T3FREE, THYROIDAB in the last 72 hours. Anemia Panel: No results for input(s): VITAMINB12, FOLATE, FERRITIN, TIBC, IRON, RETICCTPCT in the last 72 hours. Sepsis Labs: No results for input(s): PROCALCITON, LATICACIDVEN in the last 168 hours.  Recent Results (from the past 240 hour(s))  Respiratory Panel by RT PCR (Flu A&B, Covid) - Nasopharyngeal Swab     Status: None   Collection Time: 04/15/19  7:07 AM   Specimen: Nasopharyngeal Swab  Result Value Ref Range Status   SARS Coronavirus 2 by RT PCR NEGATIVE NEGATIVE Final    Comment: (NOTE) SARS-CoV-2 target nucleic acids are NOT DETECTED. The SARS-CoV-2 RNA is generally detectable in upper respiratoy specimens during the acute phase of infection. The lowest concentration of SARS-CoV-2 viral copies this assay can detect is 131 copies/mL. A negative result does not preclude SARS-Cov-2 infection and should not be used as the sole basis for treatment or other patient management decisions. A negative result may occur with  improper specimen  collection/handling, submission of specimen other than nasopharyngeal swab, presence of viral mutation(s) within the areas targeted by this assay, and inadequate number of viral copies (<131 copies/mL). A negative result must be combined with clinical observations, patient history, and epidemiological information. The expected result is Negative. Fact Sheet for Patients:  PinkCheek.be Fact Sheet for Healthcare Providers:  GravelBags.it This test is not yet ap proved or cleared by the [place] FDA and  has been authorized for detection and/or diagnosis of SARS-CoV-2 by FDA under an Emergency Use Authorization (EUA). This EUA will remain  in effect (meaning this test can be used) for the duration of the COVID-19 declaration under Section 564(b)(1) of the Act, 21 U.S.C. section 360bbb-3(b)(1), unless the authorization is terminated or revoked sooner.    Influenza A by PCR NEGATIVE NEGATIVE Final   Influenza B by PCR NEGATIVE NEGATIVE Final    Comment: (NOTE) The Xpert Xpress SARS-CoV-2/FLU/RSV assay is intended as an aid in  the diagnosis of influenza from Nasopharyngeal swab specimens and  should not be used as a sole basis for treatment. Nasal washings and  aspirates are unacceptable for Xpert Xpress SARS-CoV-2/FLU/RSV  testing. Fact Sheet for Patients: PinkCheek.be Fact Sheet for Healthcare Providers: GravelBags.it This test is not yet approved or cleared by the [place] FDA and  has been authorized for detection and/or diagnosis of SARS-CoV-2 by  FDA under an Emergency Use Authorization (EUA). This EUA will remain  in effect (meaning this test can be used) for the duration of the  Covid-19 declaration under Section 564(b)(1) of the Act, 21  U.S.C. section 360bbb-3(b)(1), unless the authorization is  terminated or revoked. Performed at [facility], [place] [address], [place], [place] [address]   Culture, blood (routine x 2)     Status: None (Preliminary result)   Collection Time: 04/15/19  8:25 AM   Specimen: BLOOD RIGHT HAND  Result Value Ref Range Status   Specimen Description   Final    BLOOD RIGHT HAND Performed at [place] [address] [place], [place] [address]    Special Requests   Final    BOTTLES DRAWN AEROBIC ONLY Blood Culture results may not be optimal due to an excessive volume of blood received in culture bottles Performed at [facility] [address], [place], [place] [address]    Culture   Final    NO GROWTH 2 DAYS Performed at [place],  [place] [address]    Report Status PENDING  Incomplete  Culture, blood (routine x 2)     Status: None (Preliminary result)   Collection Time: 04/15/19  8:30 AM   Specimen: BLOOD LEFT HAND  Result Value Ref Range Status   Specimen Description   Final    BLOOD LEFT HAND Performed at [facility], [address], [place], [place] [address]    Special Requests   Final    BOTTLES DRAWN AEROBIC ONLY Blood Culture results may not be optimal due to an inadequate volume of blood received in culture bottles Performed at [facility], [address], [place], [place] [address]    Culture   Final    NO GROWTH 2 DAYS Performed at [facility], [address], [place], [place] [address]    Report Status PENDING  Incomplete  SARS CORONAVIRUS 2 (TAT 6-24 HRS) Nasopharyngeal Nasopharyngeal Swab     Status: None   Collection Time: 04/15/19  8:46 AM   Specimen: Nasopharyngeal Swab  Result Value Ref Range Status   SARS Coronavirus 2 NEGATIVE NEGATIVE Final    Comment: (NOTE) SARS-CoV-2 target nucleic acids are NOT DETECTED. The SARS-CoV-2 RNA is generally detectable in upper and lower respiratory specimens during the acute phase of infection.  Negative results do not preclude SARS-CoV-2 infection, do not rule out co-infections with other pathogens, and should not be used as the sole basis for treatment or other patient management decisions. Negative results must be combined with clinical observations, patient history, and epidemiological information. The expected result is Negative. Fact Sheet for Patients: HairSlick.no Fact Sheet for Healthcare Providers: quierodirigir.com This test is not yet approved or cleared by the [place] FDA and  has been authorized for detection and/or diagnosis of SARS-CoV-2 by FDA under an Emergency Use Authorization (EUA). This EUA will remain  in effect (meaning this test can be used) for the duration of the COVID-19 declaration under Section 56 4(b)(1) of the Act, 21 U.S.C. section 360bbb-3(b)(1), unless the authorization is terminated or revoked sooner. Performed at [facility], [address], [place], [place] [address]   Culture, blood (routine x 2)     Status: None (Preliminary result)   Collection Time: 04/15/19  7:26 PM   Specimen: BLOOD  Result Value Ref Range Status   Specimen Description BLOOD RIGHT LOWER ARM  Final   Special Requests   Final    BOTTLES DRAWN AEROBIC AND ANAEROBIC Blood Culture adequate volume   Culture   Final    NO GROWTH 2 DAYS Performed at [facility], [address] [place], [place] [address]    Report Status PENDING  Incomplete  Culture, blood (routine x 2)     Status: Abnormal   Collection Time: 04/15/19  7:26 PM   Specimen: BLOOD  Result Value Ref Range Status   Specimen Description BLOOD RIGHT ARM  Final   Special Requests   Final    BOTTLES DRAWN AEROBIC AND ANAEROBIC Blood Culture adequate volume   Culture  Setup Time   Final    GRAM POSITIVE COCCI IN BOTH AEROBIC AND ANAEROBIC BOTTLES CRITICAL RESULT CALLED TO, READ BACK BY AND VERIFIED WITH: A. MEYER, PHARMD AT 1730 ON  04/16/19 BY C. JESSUP, MT.    Culture (A)  Final    STAPHYLOCOCCUS SPECIES (COAGULASE NEGATIVE) THE SIGNIFICANCE OF ISOLATING THIS ORGANISM FROM A SINGLE SET OF BLOOD CULTURES WHEN MULTIPLE SETS ARE DRAWN IS UNCERTAIN.  PLEASE NOTIFY THE MICROBIOLOGY DEPARTMENT WITHIN ONE WEEK IF SPECIATION AND SENSITIVITIES ARE REQUIRED. Performed at [facility], [address] [place], [place] [address]    Report Status 04/18/2019 FINAL  Final  Surgical pcr screen     Status: Abnormal   Collection Time: 04/16/19  6:09 PM   Specimen: Nasal Mucosa; Nasal Swab  Result Value Ref Range Status   MRSA, PCR POSITIVE (A) NEGATIVE Final    Comment: RESULT CALLED TO, READ BACK BY AND VERIFIED WITH: RAKHIMOVA,R RN 2206 04/16/2019 MITCHELL,L    Staphylococcus aureus POSITIVE (A) NEGATIVE Final    Comment: (NOTE) The Xpert SA Assay (FDA approved for NASAL specimens in patients 63 years of age and older), is one component of a comprehensive surveillance program. It is not intended to diagnose infection nor to guide or monitor treatment. Performed at [facility], [address], [place], [place] [address]   Aerobic/Anaerobic Culture (surgical/deep wound)     Status: None (Preliminary result)   Collection Time: 04/17/19  7:28 AM   Specimen: PATH Other; Body Fluid  Result Value Ref Range Status   Specimen Description ABSCESS LEFT CHEST  Final   Special Requests PATIENT ON FOLLOWING ZOSYN  Final   Gram Stain   Final    MODERATE WBC PRESENT, PREDOMINANTLY PMN NO ORGANISMS SEEN Performed at [facility], [address], [place], [place] [address]    Culture FEW STAPHYLOCOCCUS AUREUS  Final   Report Status PENDING  Incomplete      Radiology Studies: No results found.    LOS: 3 days   Time spent: More than 50% of that time was spent in counseling and/or coordination of care.  Lanae Boast, MD [facility]  04/18/2019, 10:56 AM

## 2019-04-18 NOTE — TOC Initial Note (Signed)
Transition of Care [facility]) - Initial/Assessment Note    Patient Details  Name: Grant Tapia MRN: [id number] Date of Birth: 06/01/1985  Transition of Care [facility]) CM/SW Contact:    Alexander [place], [place] Phone Number: 04/18/2019, 3:57 PM  Clinical Narrative:                 CSW met with pt at bedside. Introduced self, role, reason for visit. Pt from home with the mother of his children [place] who he states is a travel Therapist, sports. He has three children ranging from 74-30 years old and it is clear that he really enjoys spending time with them. We discussed current situation- he is staying with Bubba Hales, currently not working and does not have insurance. He states that he is open to PCP appointment being made and understands that the clinics are often booked out until April.   Pt states that his chest is still a bit sore. We discussed substance use- pt admits that drug of choice is IV substances. He states that he feels like this experience has made him realize how serious it can be to continue to use. We did a brief life review- we discussed that individuals cope in different ways and we are here to provide support and resources to find new and healthy ways to cope. Motivations include his children to maintain sobriety. Pt states that he is open to resources and these were placed at his request in his pt belongings bag. Pt states that he does have a ride home but depending on day of discharge he may need a shirt as he does no longer have one.   TOC team will continue to follow. Pt aware we will assess for medication assistance and for medications to be filled through [facility].   Expected Discharge Plan: Home/Self Care Barriers to Discharge: Continued Medical Work up   Patient Goals and CMS Choice Patient states their goals for this hospitalization and ongoing recovery are:: to get home/have support with cessation moving forward. CMS Medicare.gov Compare Post Acute Care list provided to::  (n/a) Choice offered to / list presented to : Patient  Expected Discharge Plan and Services Expected Discharge Plan: Home/Self Care In-house Referral: Clinical Social Work Discharge Planning Services: CM Consult, [place] Program, Medication Assistance, Follow-up appt scheduled Post Acute Care Choice: NA Living arrangements for the past 2 months: Apartment   Prior Living Arrangements/Services Living arrangements for the past 2 months: Apartment Lives with:: Minor Children, Significant Other Patient language and need for interpreter reviewed:: Yes(no needs) Do you feel safe going back to the place where you live?: Yes      Need for Family Participation in Patient Care: Yes (Comment)(support and assistance as needed) Care giver support system in place?: Yes (comment)(mother of his children)   Criminal Activity/Legal Involvement Pertinent to Current Situation/Hospitalization: No - Comment as needed(previous hx)  Activities of Daily Living Home Assistive Devices/Equipment: None ADL Screening (condition at time of admission) Patient's cognitive ability adequate to safely complete daily activities?: Yes Is the patient deaf or have difficulty hearing?: No Does the patient have difficulty seeing, even when wearing glasses/contacts?: No Does the patient have difficulty concentrating, remembering, or making decisions?: No Patient able to express need for assistance with ADLs?: Yes Does the patient have difficulty dressing or bathing?: No Independently performs ADLs?: Yes (appropriate for developmental age) Does the patient have difficulty walking or climbing stairs?: No Weakness of Legs: None Weakness of Arms/Hands: None  Permission Sought/Granted Permission sought to  share information with : Family Supports Permission granted to share information with : Yes, Verbal Permission Granted  Share Information with NAME: Josephina Shih  Permission granted to share info w AGENCY: [place] granted to share info w Relationship: partner/mother of his children  Permission granted to share info w Contact Information: [phone]  Emotional Assessment Appearance:: Appears stated age Attitude/Demeanor/Rapport: Engaged, Gracious Affect (typically observed): Accepting, Adaptable, Appropriate Orientation: : Oriented to Self, Oriented to Place, Oriented to  Time, Oriented to Situation Alcohol / Substance Use: Illicit Drugs, Alcohol Use Psych Involvement: Outpatient Provider(previous)  Admission diagnosis:  Cellulitis and abscess of neck [L03.221, L02.11] Neck abscess [L02.11] Patient Active Problem List   Diagnosis Date Noted  . HTN (hypertension), benign 04/15/2019  . Poisoning by heroin, accidental (unintentional), initial encounter ([place]) 05/08/2017  . Heroin dependence ([place]) 05/08/2017  . Back pain 12/30/2016  . Abnormal LFTs 12/30/2016  . Polysubstance abuse ([place])   . Cellulitis 11/19/2016  . IV drug user 11/19/2016  . Neck abscess 11/19/2016  . Delirium   . Tachycardia   . Overdose 07/18/2016  . Drug withdrawal ([place]) 07/16/2016  . AKI (acute kidney injury) ([place]) 07/15/2016  . Alcohol abuse 07/15/2016  . Heroin abuse ([place]) 07/15/2016  . 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 Cocaine abuse ([place]) 07/15/2016  . Acute metabolic encephalopathy 84/85/9276  . Lactic acidosis 03/29/2014  . Sepsis ([place]) 03/27/2014  . Chest pain   . Headache   . Neck pain    PCP:  Patient, No Pcp Per Pharmacy:   [facility] [place], [place] [address] AT [facility] [address] [place] [address] Phone: [phone] Fax: [phone]  [id number] [address] [address]: [phone]: [phone]  Readmission Risk Interventions Readmission Risk Prevention Plan 04/18/2019  Transportation Screening Complete  PCP or Specialist Appt within 5-7  Days Complete  Home Care Screening Complete  Medication Review (RN CM) Referral to Pharmacy  Some recent data might be hidden

## 2019-04-18 NOTE — Progress Notes (Signed)
Pharmacy Antibiotic Note  Grant Tapia is a 34 y.o. male admitted on 04/14/2019 with neck abscess.  Pharmacy has been consulted for Vancomycin/Zosyn dosing. WBC mildly elevated. Renal function good. Patient had I&D on 1/4 and operative cultures are growing S. Aureus, sensitivities pending.    Plan: Continue Vancomycin 1500 mg IV q12h Stop Zosyn - Discussed with Dr. Jonathon Tapia Trend WBC, temp, renal function  Drug levels as indicated if therapy to continue for another 2 days  Temp (24hrs), Avg:98.9 F (37.2 C), Min:98.5 F (36.9 C), Max:99.4 F (37.4 C)  Recent Labs  Lab 04/14/19 2214 04/17/19 0421  WBC 12.7* 9.1  CREATININE 1.02 0.71    Estimated Creatinine Clearance: 138.1 mL/min (by C-G formula based on SCr of 0.71 mg/dL).    Grant Tapia, PharmD, BCPS-AQ ID Clinical Pharmacist [phone]

## 2019-04-19 DIAGNOSIS — L0211 Cutaneous abscess of neck: Secondary | ICD-10-CM

## 2019-04-19 DIAGNOSIS — L03221 Cellulitis of neck: Secondary | ICD-10-CM

## 2019-04-19 LAB — HCV RNA QUANT
HCV Quantitative Log: 6.456 log10 IU/mL (ref >1.70)
HCV Quantitative: 2860000 IU/mL (ref >50)

## 2019-04-19 LAB — ACID FAST SMEAR (AFB, MYCOBACTERIA): Acid Fast Smear: NEGATIVE

## 2019-04-19 MED ORDER — AMLODIPINE BESYLATE 2.5 MG PO TABS
2.5000 mg | ORAL_TABLET | Freq: Every day | ORAL | Status: DC
  Administered 2019-04-19 – 2019-04-20 (×2): 2.5 mg via ORAL
  Filled 2019-04-19 (×2): qty 1

## 2019-04-19 NOTE — Progress Notes (Addendum)
PROGRESS NOTE    Grant HarpsLouis Nathaniel Tapia  UJW:[id number] DOB: 1985-09-07 DOA: 04/14/2019 PCP: Patient, No Pcp Per   Brief Narrative: As per HPI: 33 y.o.malewith medical history significant ofpolysubstance abuse. Reports that for the last month he has had various areas of pain (both joint and skin) moving up his left side. He notes that for the last few days he has had severe left neck pain and swelling. It has not yet hinder him from eat or drinking, but he is concerned it will. He knows of no alleviating factors. Movement or palpation aggravates it. The pain is constant and ranges from throbbing to sharp dependent on rotation of the neck or palpation of the neck. He became concerned after hearing a story from a fellow heroin user and sought out help in the ED.  ED Course:CT of neck shows abscess. ED spoke with ENT who requested a medical admission and [facility]. He was started on vanc/zosyn. [facility] was called for admission  Patient was seen by ENT, on broad-spectrum antibiotics 1/4: I&D of the neck abscess  Subjective: Seen and examined this morning.  Complains of some soreness on the drain site. Drain was removed this morning. Afebrile no nausea vomiting.  Assessment & Plan:  Neck abscess in the setting of polysubstance abuse IV drug abuse: Appreciate ENT input.  Status post incision and drainage 1/4-he is hemodynamically stable, continue pain control with oral oxycodone, IV Toradol, of Zosyn as wound is growing Staphylococcus.  Cont vancomycin.  ENT concerned that there may be navicular joint involvement perhaps MRI  or CVTS surgery consult advised.  Will d/w CVTS. Recent Labs  Lab 04/14/19 2214 04/17/19 0421  WBC 12.7* 9.1   STAPHYLOCOCCUS SPECIES (COAGULASE NEGATIVE) in 1 blood culture, likely contamination.  Rest of the blood culture no growth so far.  Polysubstance abuse/IV drug abuse: initially patient reported no drug use since December 20, upon further questioning -  reported last use 2 nights PTA,reportED injecting into his right arm, denied injection to his neck.  He was counseled on evaluation abstinence.  So far blood culture negative as above.  Abnormal LFTs: Improved.  Hep C antibody positive, checking hep C RNA.  Equivocal for hep A IgM and was negative on recheck.  Hypertension :not on any medication.Added as needed hydralazine. Also started on Amlodipine 2.5 mg  Body mass index is 26.54 kg/m.    DVT prophylaxis:Heparin Code Status: full Family Communication: plan of care discussed with patient at bedside. Disposition Plan:Remains inpatient pending clinical improvement in patient's neck abscess.  Consultants:ENT,[facility]- left message for Ryan at [facility]. Procedures:NECK I/d:1/4  TTE  1. Left ventricular ejection fraction, by visual estimation, is 55 to 60%. The left ventricle has normal function. There is no left ventricular hypertrophy.  2. The left ventricle has no regional wall motion abnormalities.  3. Global right ventricle has normal systolic function.The right ventricular size is normal. No increase in right ventricular wall thickness.  4. Left atrial size was normal.  5. Right atrial size was normal.  6. The mitral valve is normal in structure. No evidence of mitral valve regurgitation. No evidence of mitral stenosis.  7. The tricuspid valve is normal in structure.  8. The aortic valve is normal in structure. Aortic valve regurgitation is not visualized. No evidence of aortic valve sclerosis or stenosis.  9. The pulmonic valve was normal in structure. Pulmonic valve regurgitation is not visualized. 10. Normal pulmonary artery systolic pressure. 11. The inferior vena cava is normal in  size with greater than 50% respiratory variability, suggesting right atrial pressure of 3 mmHg. 12. No evidence for endocarditis.  Microbiology: wound w/ staph and blood culture w/ coug neg staph in one set only.   Antimicrobials: Anti-infectives (From  admission, onward)   Start     Dose/Rate Route Frequency Ordered Stop   04/15/19 2330  piperacillin-tazobactam (ZOSYN) IVPB 3.375 g  Status:  Discontinued     3.375 g 12.5 mL/hr over 240 Minutes Intravenous Every 8 hours 04/15/19 2319 04/18/19 1129   04/15/19 2330  vancomycin (VANCOREADY) IVPB 1500 mg/300 mL     1,500 mg 150 mL/hr over 120 Minutes Intravenous Every 12 hours 04/15/19 2319     04/15/19 0630  vancomycin (VANCOCIN) IVPB 1000 mg/200 mL premix     1,000 mg 200 mL/hr over 60 Minutes Intravenous  Once 04/15/19 0620 04/15/19 0824   04/15/19 0630  piperacillin-tazobactam (ZOSYN) IVPB 3.375 g     3.375 g 100 mL/hr over 30 Minutes Intravenous  Once 04/15/19 0620 04/15/19 0701     Objective: Vitals:   04/18/19 0534 04/18/19 1459 04/18/19 2056 04/19/19 0608  BP: (!) 143/93 (!) 153/109 (!) 161/104 (!) 144/98  Pulse: 86 92 99 79  Resp: 18 18 20 18   Temp: 98.7 F (37.1 C) 98.4 F (36.9 C) 98.5 F (36.9 C) 98 F (36.7 C)  TempSrc: Oral Oral Oral Oral  SpO2: 99% 97% 100% 100%  Weight:        Intake/Output Summary (Last 24 hours) at 04/19/2019 1409 Last data filed at 04/19/2019 0934 Gross per 24 hour  Intake 720 ml  Output --  Net 720 ml   Filed Weights   04/16/19 1130  Weight: 83.9 kg   Weight change:   Body mass index is 26.54 kg/m.  Intake/Output from previous day: 01/05 0701 - 01/06 0700 In: 840 [P.O.:840] Out: 500 [Urine:500] Intake/Output this shift: Total I/O In: 360 [P.O.:360] Out: -   Examination: General exam: AAO x3, NAD LKT:GYBW mucosa moist, Ear/Nose WNL grossly, dentition normal. Lower neck w/ dressing+, tender navicular joint. Respiratory system: Bilateral clear breath sounds. No use of accessory muscle Cardiovascular system: S1 & S2 +, No JVD, regular RR. Gastrointestinal system: Abdomen soft, NT,ND, BS+ Nervous System:Alert, awake, moving extremities and grossly nonfocal Extremities: No edema, distal peripheral pulses palpable.  Skin: No  rashes,no icterus. MSK: Normal muscle bulk,tone, power Medications:  Scheduled Meds: . chlordiazePOXIDE  10 mg Oral TID  . Chlorhexidine Gluconate Cloth  6 each Topical Q0600  . docusate sodium  100 mg Oral BID  . folic acid  1 mg Oral Daily  . heparin  5,000 Units Subcutaneous Q8H  . multivitamin with minerals  1 tablet Oral Daily  . mupirocin ointment  1 application Nasal BID  . thiamine  100 mg Oral Daily   Continuous Infusions: . sodium chloride 75 mL/hr at 04/16/19 0950  . vancomycin 1,500 mg (04/19/19 1327)    Data Reviewed: I have personally reviewed following labs and imaging studies  CBC: Recent Labs  Lab 04/14/19 2214 04/17/19 0421  WBC 12.7* 9.1  NEUTROABS 9.1* 5.7  HGB 12.0* 11.9*  HCT 37.7* 36.4*  MCV 92.0 90.3  PLT 228 389   Basic Metabolic Panel: Recent Labs  Lab 04/14/19 2214 04/17/19 0421  NA 138 138  K 4.2 3.7  CL 103 105  CO2 25 23  GLUCOSE 77 106*  BUN 16 5*  CREATININE 1.02 0.71  CALCIUM 8.5* 8.4*   GFR: Estimated Creatinine Clearance:  138.1 mL/min (by C-G formula based on SCr of 0.71 mg/dL). Liver Function Tests: Recent Labs  Lab 04/14/19 2214 04/17/19 0421  AST 64* 31  ALT 66* 42  ALKPHOS 49 41  BILITOT 1.1 0.5  PROT 7.1 6.6  ALBUMIN 3.5 2.5*   No results for input(s): LIPASE, AMYLASE in the last 168 hours. No results for input(s): AMMONIA in the last 168 hours. Coagulation Profile: Recent Labs  Lab 04/15/19 0846  INR 1.3*   Cardiac Enzymes: No results for input(s): CKTOTAL, CKMB, CKMBINDEX, TROPONINI in the last 168 hours. BNP (last 3 results) No results for input(s): PROBNP in the last 8760 hours. HbA1C: No results for input(s): HGBA1C in the last 72 hours. CBG: No results for input(s): GLUCAP in the last 168 hours. Lipid Profile: No results for input(s): CHOL, HDL, LDLCALC, TRIG, CHOLHDL, LDLDIRECT in the last 72 hours. Thyroid Function Tests: No results for input(s): TSH, T4TOTAL, FREET4, T3FREE, THYROIDAB in  the last 72 hours. Anemia Panel: No results for input(s): VITAMINB12, FOLATE, FERRITIN, TIBC, IRON, RETICCTPCT in the last 72 hours. Sepsis Labs: No results for input(s): PROCALCITON, LATICACIDVEN in the last 168 hours.  Recent Results (from the past 240 hour(s))  Respiratory Panel by RT PCR (Flu A&B, Covid) - Nasopharyngeal Swab     Status: None   Collection Time: 04/15/19  7:07 AM   Specimen: Nasopharyngeal Swab  Result Value Ref Range Status   SARS Coronavirus 2 by RT PCR NEGATIVE NEGATIVE Final    Comment: (NOTE) SARS-CoV-2 target nucleic acids are NOT DETECTED. The SARS-CoV-2 RNA is generally detectable in upper respiratoy specimens during the acute phase of infection. The lowest concentration of SARS-CoV-2 viral copies this assay can detect is 131 copies/mL. A negative result does not preclude SARS-Cov-2 infection and should not be used as the sole basis for treatment or other patient management decisions. A negative result may occur with  improper specimen collection/handling, submission of specimen other than nasopharyngeal swab, presence of viral mutation(s) within the areas targeted by this assay, and inadequate number of viral copies (<131 copies/mL). A negative result must be combined with clinical observations, patient history, and epidemiological information. The expected result is Negative. Fact Sheet for Patients:  https://www.moore.com/https://www.fda.gov/media/142436/download Fact Sheet for Healthcare Providers:  https://www.young.biz/https://www.fda.gov/media/142435/download This test is not yet ap proved or cleared by the [place] FDA and  has been authorized for detection and/or diagnosis of SARS-CoV-2 by FDA under an Emergency Use Authorization (EUA). This EUA will remain  in effect (meaning this test can be used) for the duration of the COVID-19 declaration under Section 564(b)(1) of the Act, 21 U.S.C. section 360bbb-3(b)(1), unless the authorization is terminated or revoked sooner.     Influenza A by PCR NEGATIVE NEGATIVE Final   Influenza B by PCR NEGATIVE NEGATIVE Final    Comment: (NOTE) The Xpert Xpress SARS-CoV-2/FLU/RSV assay is intended as an aid in  the diagnosis of influenza from Nasopharyngeal swab specimens and  should not be used as a sole basis for treatment. Nasal washings and  aspirates are unacceptable for Xpert Xpress SARS-CoV-2/FLU/RSV  testing. Fact Sheet for Patients: https://www.moore.com/https://www.fda.gov/media/142436/download Fact Sheet for Healthcare Providers: https://www.young.biz/https://www.fda.gov/media/142435/download This test is not yet approved or cleared by the [place] FDA and  has been authorized for detection and/or diagnosis of SARS-CoV-2 by  FDA under an Emergency Use Authorization (EUA). This EUA will remain  in effect (meaning this test can be used) for the duration of the  Covid-19 declaration under Section 564(b)(1) of the Act,  21  U.S.C. section 360bbb-3(b)(1), unless the authorization is  terminated or revoked. Performed at [facility], [address], [place], [place] [address]   Culture, blood (routine x 2)     Status: None (Preliminary result)   Collection Time: 04/15/19  8:25 AM   Specimen: BLOOD RIGHT HAND  Result Value Ref Range Status   Specimen Description   Final    BLOOD RIGHT HAND Performed at [facility], [address], [place], [place] [address]    Special Requests   Final    BOTTLES DRAWN AEROBIC ONLY Blood Culture results may not be optimal due to an excessive volume of blood received in culture bottles Performed at [facility], [address] [place], [place] [address]    Culture   Final    NO GROWTH 4 DAYS Performed at [facility], [address], [place], [place] [address]    Report Status PENDING  Incomplete  Culture, blood (routine x 2)     Status: None (Preliminary result)   Collection Time: 04/15/19  8:30 AM   Specimen: BLOOD LEFT HAND  Result Value Ref Range  Status   Specimen Description   Final    BLOOD LEFT HAND Performed at [facility], [address] [place], [place] [address]    Special Requests   Final    BOTTLES DRAWN AEROBIC ONLY Blood Culture results may not be optimal due to an inadequate volume of blood received in culture bottles Performed at [facility], [address], [place], [place] [address]    Culture   Final    NO GROWTH 4 DAYS Performed at [facility], [address] [place], [place] [address]    Report Status PENDING  Incomplete  SARS CORONAVIRUS 2 (TAT 6-24 HRS) Nasopharyngeal Nasopharyngeal Swab     Status: None   Collection Time: 04/15/19  8:46 AM   Specimen: Nasopharyngeal Swab  Result Value Ref Range Status   SARS Coronavirus 2 NEGATIVE NEGATIVE Final    Comment: (NOTE) SARS-CoV-2 target nucleic acids are NOT DETECTED. The SARS-CoV-2 RNA is generally detectable in upper and lower respiratory specimens during the acute phase of infection. Negative results do not preclude SARS-CoV-2 infection, do not rule out co-infections with other pathogens, and should not be used as the sole basis for treatment or other patient management decisions. Negative results must be combined with clinical observations, patient history, and epidemiological information. The expected result is Negative. Fact Sheet for Patients: HairSlick.no Fact Sheet for Healthcare Providers: quierodirigir.com This test is not yet approved or cleared by the [place] FDA and  has been authorized for detection and/or diagnosis of SARS-CoV-2 by FDA under an Emergency Use Authorization (EUA). This EUA will remain  in effect (meaning this test can be used) for the duration of the COVID-19 declaration under Section 56 4(b)(1) of the Act, 21 U.S.C. section 360bbb-3(b)(1), unless the authorization is terminated or revoked sooner. Performed at [facility], [address], [place], [place] [address]   Culture, blood (routine x 2)     Status: None (Preliminary result)   Collection Time: 04/15/19  7:26 PM   Specimen: BLOOD  Result Value Ref Range Status   Specimen Description BLOOD RIGHT LOWER ARM  Final   Special Requests   Final    BOTTLES DRAWN AEROBIC AND ANAEROBIC Blood Culture adequate volume   Culture   Final    NO  GROWTH 4 DAYS Performed at [facility], [address] [place], [place] [address]    Report Status PENDING  Incomplete  Culture, blood (routine x 2)     Status: Abnormal   Collection Time: 04/15/19  7:26 PM   Specimen: BLOOD  Result Value Ref Range Status   Specimen Description BLOOD RIGHT ARM  Final   Special Requests   Final    BOTTLES DRAWN AEROBIC AND ANAEROBIC Blood Culture adequate volume   Culture  Setup Time   Final    GRAM POSITIVE COCCI IN BOTH AEROBIC AND ANAEROBIC BOTTLES CRITICAL RESULT CALLED TO, READ BACK BY AND VERIFIED WITH: A. MEYER, PHARMD AT 1730 ON 04/16/19 BY C. JESSUP, MT.    Culture (A)  Final    STAPHYLOCOCCUS SPECIES (COAGULASE NEGATIVE) THE SIGNIFICANCE OF ISOLATING THIS ORGANISM FROM A SINGLE SET OF BLOOD CULTURES WHEN MULTIPLE SETS ARE DRAWN IS UNCERTAIN. PLEASE NOTIFY THE MICROBIOLOGY DEPARTMENT WITHIN ONE WEEK IF SPECIATION AND SENSITIVITIES ARE REQUIRED. Performed at [facility], [address], [place], [place] [address]    Report Status 04/18/2019 FINAL  Final  Surgical pcr screen     Status: Abnormal   Collection Time: 04/16/19  6:09 PM   Specimen: Nasal Mucosa; Nasal Swab  Result Value Ref Range Status   MRSA, PCR POSITIVE (A) NEGATIVE Final    Comment: RESULT CALLED TO, READ BACK BY AND VERIFIED WITH: RAKHIMOVA,R RN 2206 04/16/2019 MITCHELL,L    Staphylococcus aureus POSITIVE (A) NEGATIVE Final    Comment: (NOTE) The Xpert SA Assay (FDA approved for NASAL specimens in patients 41 years of age and older), is one component of a  comprehensive surveillance program. It is not intended to diagnose infection nor to guide or monitor treatment. Performed at [facility], [address] [place], [place] [address]   Fungus Culture With Stain     Status: None (Preliminary result)   Collection Time: 04/17/19  7:28 AM   Specimen: PATH Other; Body Fluid  Result Value Ref Range Status   Fungus Stain Final report  Final    Comment: (NOTE) Performed At: [facility] [address] [place], [place] [id number] Jolene Schimke MD WH:[id number]    Fungus (Mycology) Culture PENDING  Incomplete   Fungal Source ABSCESS  Final    Comment: LEFT CHEST Performed at [facility], [address] [place], [place] [address]   Aerobic/Anaerobic Culture (surgical/deep wound)     Status: None (Preliminary result)   Collection Time: 04/17/19  7:28 AM   Specimen: PATH Other; Body Fluid  Result Value Ref Range Status   Specimen Description ABSCESS LEFT CHEST  Final   Special Requests PATIENT ON FOLLOWING ZOSYN  Final   Gram Stain   Final    MODERATE WBC PRESENT, PREDOMINANTLY PMN NO ORGANISMS SEEN Performed at [facility], [address] [place], [place] [address]    Culture   Final    FEW STAPHYLOCOCCUS AUREUS NO ANAEROBES ISOLATED; CULTURE IN PROGRESS FOR 5 DAYS    Report Status PENDING  Incomplete   Organism ID, Bacteria STAPHYLOCOCCUS AUREUS  Final      Susceptibility   Staphylococcus aureus - MIC*    CIPROFLOXACIN >=8 RESISTANT Resistant     ERYTHROMYCIN >=8 RESISTANT Resistant     GENTAMICIN <=0.5 SENSITIVE Sensitive     OXACILLIN 0.5 SENSITIVE Sensitive     TETRACYCLINE <=1 SENSITIVE Sensitive     VANCOMYCIN 1 SENSITIVE Sensitive  TRIMETH/SULFA <=10 SENSITIVE Sensitive     CLINDAMYCIN <=0.25 SENSITIVE Sensitive     RIFAMPIN <=0.5 SENSITIVE Sensitive     Inducible Clindamycin NEGATIVE Sensitive     * FEW STAPHYLOCOCCUS AUREUS  Fungus Culture Result     Status: None   Collection Time:  04/17/19  7:28 AM  Result Value Ref Range Status   Result 1 Comment  Final    Comment: (NOTE) KOH/Calcofluor preparation:  no fungus observed. Performed At: [facility] [address] [place], [place] [id number] Jolene Schimke MD FY:[id number]       Radiology Studies: No results found.    LOS: 4 days   Time spent: More than 50% of that time was spent in counseling and/or coordination of care.  Lanae Boast, MD [facility]  04/19/2019, 2:09 PM

## 2019-04-19 NOTE — Progress Notes (Signed)
[facility] BRIEF PROGRESS NOTE  2 Days Post-Op  S/P Procedure(s) (LRB): INCISION AND DRAINAGE NECK ABSCESS (Left)   Patient reports feeling much better. No fevers or chills. Swelling in neck dramatically improved.  There is still some swelling and tenderness over sternoclavicular joint and head of clavicle, but overall improved from previously.  I agree w/ at least a 2 week course of antibiotics.  As long as patient continues to improve clinically I would not recommend any change in therapy or f/u imaging studies.  If pain/swelling persists or gets worse over the clavicular head and/or sternoclavicular joint after antibiotics have been stopped then I would recommend f/u CT scan or MRI  Discussed with patient who understands.  All questions answered.  Purcell Nails, MD 04/19/2019 4:28 PM

## 2019-04-19 NOTE — Plan of Care (Signed)
  Problem: Education: Goal: Knowledge of General Education information will improve Description: Including pain rating scale, medication(s)/side effects and non-pharmacologic comfort measures 04/19/2019 2132 by Caroll Rancher, RN Outcome: Progressing 04/19/2019 2132 by Caroll Rancher, RN Outcome: Progressing   Problem: Health Behavior/Discharge Planning: Goal: Ability to manage health-related needs will improve 04/19/2019 2132 by Caroll Rancher, RN Outcome: Progressing 04/19/2019 2132 by Caroll Rancher, RN Outcome: Progressing   Problem: Clinical Measurements: Goal: Ability to maintain clinical measurements within normal limits will improve 04/19/2019 2132 by Caroll Rancher, RN Outcome: Progressing 04/19/2019 2132 by Caroll Rancher, RN Outcome: Progressing Goal: Will remain free from infection 04/19/2019 2132 by Caroll Rancher, RN Outcome: Progressing 04/19/2019 2132 by Caroll Rancher, RN Outcome: Progressing Goal: Diagnostic test results will improve 04/19/2019 2132 by Caroll Rancher, RN Outcome: Progressing 04/19/2019 2132 by Caroll Rancher, RN Outcome: Progressing Goal: Respiratory complications will improve 04/19/2019 2132 by Caroll Rancher, RN Outcome: Progressing 04/19/2019 2132 by Caroll Rancher, RN Outcome: Progressing Goal: Cardiovascular complication will be avoided 04/19/2019 2132 by Caroll Rancher, RN Outcome: Progressing 04/19/2019 2132 by Caroll Rancher, RN Outcome: Progressing   Problem: Activity: Goal: Risk for activity intolerance will decrease 04/19/2019 2132 by Caroll Rancher, RN Outcome: Progressing 04/19/2019 2132 by Caroll Rancher, RN Outcome: Progressing   Problem: Nutrition: Goal: Adequate nutrition will be maintained 04/19/2019 2132 by Caroll Rancher, RN Outcome: Progressing 04/19/2019 2132 by Caroll Rancher, RN Outcome: Progressing   Problem: Coping: Goal: Level of anxiety will decrease 04/19/2019 2132 by Caroll Rancher, RN Outcome:  Progressing 04/19/2019 2132 by Caroll Rancher, RN Outcome: Progressing   Problem: Elimination: Goal: Will not experience complications related to bowel motility 04/19/2019 2132 by Caroll Rancher, RN Outcome: Progressing 04/19/2019 2132 by Caroll Rancher, RN Outcome: Progressing Goal: Will not experience complications related to urinary retention 04/19/2019 2132 by Caroll Rancher, RN Outcome: Progressing 04/19/2019 2132 by Caroll Rancher, RN Outcome: Progressing   Problem: Pain Managment: Goal: General experience of comfort will improve 04/19/2019 2132 by Caroll Rancher, RN Outcome: Progressing 04/19/2019 2132 by Caroll Rancher, RN Outcome: Progressing   Problem: Safety: Goal: Ability to remain free from injury will improve 04/19/2019 2132 by Caroll Rancher, RN Outcome: Progressing 04/19/2019 2132 by Caroll Rancher, RN Outcome: Progressing   Problem: Skin Integrity: Goal: Risk for impaired skin integrity will decrease 04/19/2019 2132 by Caroll Rancher, RN Outcome: Progressing 04/19/2019 2132 by Caroll Rancher, RN Outcome: Progressing

## 2019-04-19 NOTE — Plan of Care (Signed)

## 2019-04-19 NOTE — Progress Notes (Signed)
2 Days Post-Op   Subjective/Chief Complaint: Patient is still significantly improved from admission.  There is minimal drainage now.   Objective: Vital signs in last 24 hours: Temp:  [98 F (36.7 C)-98.5 F (36.9 C)] 98 F (36.7 C) (01/06 6301) Pulse Rate:  [79-99] 79 (01/06 0608) Resp:  [18-20] 18 (01/06 0608) BP: (144-161)/(98-109) 144/98 (01/06 0608) SpO2:  [97 %-100 %] 100 % (01/06 6010) Last BM Date: 04/18/19  Intake/Output from previous day: 01/05 0701 - 01/06 0700 In: 840 [P.O.:840] Out: 500 [Urine:500] Intake/Output this shift: Total I/O In: 360 [P.O.:360] Out: -   The drain was removed without difficulty.  The clavicular joint still palpates to be enlarged and slightly tender.  The surrounding area seems to be substantially improved.  Lab Results:  Recent Labs    04/17/19 0421  WBC 9.1  HGB 11.9*  HCT 36.4*  PLT 255   BMET Recent Labs    04/17/19 0421  NA 138  K 3.7  CL 105  CO2 23  GLUCOSE 106*  BUN 5*  CREATININE 0.71  CALCIUM 8.4*   PT/INR No results for input(s): LABPROT, INR in the last 72 hours. ABG No results for input(s): PHART, HCO3 in the last 72 hours.  Invalid input(s): PCO2, PO2  Studies/Results: No results found.  Anti-infectives: Anti-infectives (From admission, onward)   Start     Dose/Rate Route Frequency Ordered Stop   04/15/19 2330  piperacillin-tazobactam (ZOSYN) IVPB 3.375 g  Status:  Discontinued     3.375 g 12.5 mL/hr over 240 Minutes Intravenous Every 8 hours 04/15/19 2319 04/18/19 1129   04/15/19 2330  vancomycin (VANCOREADY) IVPB 1500 mg/300 mL     1,500 mg 150 mL/hr over 120 Minutes Intravenous Every 12 hours 04/15/19 2319     04/15/19 0630  vancomycin (VANCOCIN) IVPB 1000 mg/200 mL premix     1,000 mg 200 mL/hr over 60 Minutes Intravenous  Once 04/15/19 0620 04/15/19 0824   04/15/19 0630  piperacillin-tazobactam (ZOSYN) IVPB 3.375 g     3.375 g 100 mL/hr over 30 Minutes Intravenous  Once 04/15/19 9323  04/15/19 0701      Assessment/Plan: s/p Procedure(s): INCISION AND DRAINAGE NECK ABSCESS (Left) His abscess is drained but it still is a concern at this as navicular joint involvement.  As stated previously I do not manage joints and this would need to be reevaluated or even an MRI scan done of the joint.  I do not know what the proper management is and perhaps reconsulting orthopedics or CVTS regarding this   LOS: 4 days    [name] 04/19/2019

## 2019-04-20 LAB — VANCOMYCIN, PEAK: Vancomycin Pk: 40 ug/mL (ref 30–40)

## 2019-04-20 LAB — CULTURE, BLOOD (ROUTINE X 2)
Culture: NO GROWTH
Culture: NO GROWTH
Culture: NO GROWTH
Special Requests: ADEQUATE

## 2019-04-20 LAB — CBC
HCT: 38.1 % — ABNORMAL LOW (ref 39.0–52.0)
Hemoglobin: 12.3 g/dL — ABNORMAL LOW (ref 13.0–17.0)
MCH: 29 pg (ref 26.0–34.0)
MCHC: 32.3 g/dL (ref 30.0–36.0)
MCV: 89.9 fL (ref 80.0–100.0)
Platelets: 300 10*3/uL (ref 150–400)
RBC: 4.24 MIL/uL (ref 4.22–5.81)
RDW: 13.8 % (ref 11.5–15.5)
WBC: 7.5 10*3/uL (ref 4.0–10.5)
nRBC: 0 % (ref 0.0–0.2)

## 2019-04-20 LAB — BASIC METABOLIC PANEL
Anion gap: 8 (ref 5–15)
BUN: 13 mg/dL (ref 6–20)
CO2: 24 mmol/L (ref 22–32)
Calcium: 8.6 mg/dL — ABNORMAL LOW (ref 8.9–10.3)
Chloride: 109 mmol/L (ref 98–111)
Creatinine, Ser: 0.73 mg/dL (ref 0.61–1.24)
GFR calc Af Amer: >60 mL/min (ref >60)
GFR calc non Af Amer: >60 mL/min (ref >60)
Glucose, Bld: 113 mg/dL — ABNORMAL HIGH (ref 70–99)
Potassium: 3.6 mmol/L (ref 3.5–5.1)
Sodium: 141 mmol/L (ref 135–145)

## 2019-04-20 MED ORDER — CEPHALEXIN 500 MG PO CAPS
500.0000 mg | ORAL_CAPSULE | Freq: Three times a day (TID) | ORAL | Status: DC
  Administered 2019-04-20: 10:00:00 500 mg via ORAL
  Filled 2019-04-20: qty 1

## 2019-04-20 MED ORDER — IBUPROFEN 800 MG PO TABS: 800.0000 mg | ORAL_TABLET | Freq: Three times a day (TID) | ORAL | 0 refills | Status: DC | PRN

## 2019-04-20 MED ORDER — CEPHALEXIN 500 MG PO CAPS: 500.0000 mg | ORAL_CAPSULE | Freq: Three times a day (TID) | ORAL | 0 refills | Status: AC

## 2019-04-20 MED ORDER — AMLODIPINE BESYLATE 2.5 MG PO TABS: 2.5000 mg | ORAL_TABLET | Freq: Every day | ORAL | 0 refills | Status: DC

## 2019-04-20 MED FILL — AMLODIPINE BESYLATE 2.5 MG: 2.5 | 30 days supply | Qty: 30 | Fill #0

## 2019-04-20 MED FILL — CEPHALEXIN 500 MG CAPS: 500 | 21 days supply | Qty: 63 | Fill #0

## 2019-04-20 MED FILL — IBUPROFEN 800 MG TAB: 800 | 5 days supply | Qty: 15 | Fill #0

## 2019-04-20 NOTE — Care Management (Signed)
Prescriptions sent to [facility] , patient entered in [facility] with no co pay for Keflex and Norvasc. Advil will be $3   Ronny Flurry RN

## 2019-04-20 NOTE — Discharge Summary (Signed)
Physician Discharge Summary  Grant Tapia NWG:[id number] DOB: May 27, 1985 DOA: 04/14/2019  PCP: Patient, No Pcp Per  Admit date: 04/14/2019 Discharge date: 04/20/2019  Admitted From: home Disposition:  home  Recommendations for Outpatient Follow-up:  1. Follow up with PCP and w ENT 1-2 weeks, f/u w GI for  Hep c 2. Please obtain BMP/CBC in one week 3. Please follow up on the following pending results:  Home Health:no  Equipment/Devices: non  Discharge Condition: Stable Code Status: full Diet recommendation: Heart Healthy  Brief/Interim Summary:  33 y.o.malewith medical history significant ofpolysubstance abuse. Reports that for the last month he has had various areas of pain (both joint and skin) moving up his left side. He notes that for the last few days he has had severe left neck pain and swelling. It has not yet hinder him from eat or drinking, but he is concerned it will. He knows of no alleviating factors. Movement or palpation aggravates it. The pain is constant and ranges from throbbing to sharp dependent on rotation of the neck or palpation of the neck. He became concerned after hearing a story from a fellow heroin user and sought out help in the ED.  ED Course:CT of neck shows abscess. ED spoke with ENT who requested a medical admission and transfer to [facility]. He was started on vanc/zosyn. TRH was called for admission  Patient was seen by ENT, on broad-spectrum antibiotics 1/4: I&D of the neck abscess Wound appears much improved, treated with IV vancomycin. Discussed with ENT and the tCTS PERSONALLY and no concern for joint or bone involvement and okay to discharge on oral antibiotics.  Discussed with Dr. Orvan Falconer for antibiotic recommendation advises at least 3 weeks or Keflex given recent drug abuse history and follow-up as outpatient with ENT. He is medically stable for discharge.  He will continue on regular general dressing with GAUZE DAILY,  Discharge  Diagnoses:  Neck abscess in the setting of polysubstance abuse IV drug abuse: Continue oral Keflex, wound appears much improved after drainage.    STAPHYLOCOCCUS SPECIES (COAGULASE NEGATIVE) in 1 blood culture, likely contamination.  Rest of the blood culture no growth so far.  Discussed with Dr. Bonnita Nasuti and suggested no further treatment for this contamination.  Rest of the blood cultures are negative.  Polysubstance abuse/IV drug abuse: initially patient reported no drug use since December 20, upon further questioning - reported last use 2 nights PTA,reportED injecting into his right arm, denied injection to his neck.  He was counseled on evaluation abstinence. blood culture negative as above.  Abnormal LFTs: Improved.  Hep C antibody positive, checked hep C RNA as below.  Equivocal for hep A IgM and was negative on recheck.  Advised follow-up with ID as outpatient-number provided for follow-up HCV Quantitative >50 IU/mL 2,860,000   HCV Quantitative Log >1.70 log10 IU/mL 6.456    Hypertension :not on any medication.Added as needed hydralazine. Also started on Amlodipine 2.5 mg.  BP stable now   Consults: TCTS, ENT, ID Dr Orvan Falconer  Subjective: Sting comfortably no new complaints.  Wants to go home today. No Fever. Discharge Exam: Vitals:   04/19/19 2123 04/20/19 0430  BP: (!) 146/105 125/90  Pulse: 88 80  Resp: 18 15  Temp: 98.4 F (36.9 C) 98.2 F (36.8 C)  SpO2: 100% 100%   General: Pt is alert, awake, not in acute distress Cardiovascular: RRR, S1/S2 +, no rubs, no gallops Respiratory: CTA bilaterally, no wheezing, no rhonchi Abdominal: Soft, NT, ND, bowel sounds +  Extremities: no edema, no cyanosis Neck I/d site - Clean, some blood- healing, no drainage  Discharge Instructions  Discharge Instructions    Diet - low sodium heart healthy   Complete by: As directed    Discharge instructions   Complete by: As directed    Please call call MD or return to ER for similar  or worsening recurring problem that brought you to hospital or if any fever, pain in the neck nausea/vomiting,abdominal pain, uncontrolled pain, chest pain,  shortness of breath or any other alarming symptoms.  Please follow-up your doctor as instructed in a week time and call the office for appointment.  Please follow-up with ENT Dr Dr. Merceda Elks as instructed in 2 weeks and call the office for appointment in 2 days  Please avoid alcohol, smoking, or any other illicit substance and maintain healthy habits including taking your regular medications as prescribed.  You were cared for by a hospitalist during your hospital stay. If you have any questions about your discharge medications or the care you received while you were in the hospital after you are discharged, you can call the unit and ask to speak with the hospitalist on call if the hospitalist that took care of you is not available.  Once you are discharged, your primary care physician will handle any further medical issues. Please note that NO REFILLS for any discharge medications will be authorized once you are discharged, as it is imperative that you return to your primary care physician (or establish a relationship with a primary care physician if you do not have one) for your aftercare needs so that they can reassess your need for medications and monitor your lab values   Increase activity slowly   Complete by: As directed      Allergies as of 04/20/2019      Reactions   Bee Venom Swelling      Medication List    TAKE these medications   amLODipine 2.5 MG tablet Commonly known as: NORVASC Take 1 tablet (2.5 mg total) by mouth daily.   cephALEXin 500 MG capsule Commonly known as: KEFLEX Take 1 capsule (500 mg total) by mouth every 8 (eight) hours for 21 days.   diclofenac sodium 1 % Gel Commonly known as: VOLTAREN Apply 4 g topically 4 (four) times daily.   ibuprofen 800 MG tablet Commonly known as: ADVIL Take 1 tablet (800 mg  total) by mouth every 8 (eight) hours as needed for up to 15 doses for moderate pain.   lidocaine 5 % Commonly known as: Lidoderm Place 1 patch onto the skin daily. Remove & Discard patch within 12 hours or as directed by MD      Follow-up Information    [facility]. Schedule an appointment as soon as possible for a visit.   Why: Please reach out to arrange a f/u appointment, clinic is there to meet the needs of individuals without insurance.  Contact information: [address] [place] [address] [phone]       [name], MD. Call.   Specialty: Otolaryngology Why: follow up in 2 wks Contact information: [address] [place] [address] [phone]          Allergies  Allergen Reactions  . Bee Venom Swelling    The results of significant diagnostics from this hospitalization (including imaging, microbiology, ancillary and laboratory) are listed below for reference.    Microbiology: Recent Results (from the past 240  hour(s))  Respiratory Panel by RT PCR (Flu A&B, Covid) - Nasopharyngeal Swab     Status: None   Collection Time: 04/15/19  7:07 AM   Specimen: Nasopharyngeal Swab  Result Value Ref Range Status   SARS Coronavirus 2 by RT PCR NEGATIVE NEGATIVE Final    Comment: (NOTE) SARS-CoV-2 target nucleic acids are NOT DETECTED. The SARS-CoV-2 RNA is generally detectable in upper respiratoy specimens during the acute phase of infection. The lowest concentration of SARS-CoV-2 viral copies this assay can detect is 131 copies/mL. A negative result does not preclude SARS-Cov-2 infection and should not be used as the sole basis for treatment or other patient management decisions. A negative result may occur with  improper specimen collection/handling, submission of specimen other than nasopharyngeal swab, presence of viral mutation(s) within the areas targeted by this assay, and inadequate number  of viral copies (<131 copies/mL). A negative result must be combined with clinical observations, patient history, and epidemiological information. The expected result is Negative. Fact Sheet for Patients:  https://www.moore.com/https://www.fda.gov/media/142436/download Fact Sheet for Healthcare Providers:  https://www.young.biz/https://www.fda.gov/media/142435/download This test is not yet ap proved or cleared by the [place] FDA and  has been authorized for detection and/or diagnosis of SARS-CoV-2 by FDA under an Emergency Use Authorization (EUA). This EUA will remain  in effect (meaning this test can be used) for the duration of the COVID-19 declaration under Section 564(b)(1) of the Act, 21 U.S.C. section 360bbb-3(b)(1), unless the authorization is terminated or revoked sooner.    Influenza A by PCR NEGATIVE NEGATIVE Final   Influenza B by PCR NEGATIVE NEGATIVE Final    Comment: (NOTE) The Xpert Xpress SARS-CoV-2/FLU/RSV assay is intended as an aid in  the diagnosis of influenza from Nasopharyngeal swab specimens and  should not be used as a sole basis for treatment. Nasal washings and  aspirates are unacceptable for Xpert Xpress SARS-CoV-2/FLU/RSV  testing. Fact Sheet for Patients: https://www.moore.com/https://www.fda.gov/media/142436/download Fact Sheet for Healthcare Providers: https://www.young.biz/https://www.fda.gov/media/142435/download This test is not yet approved or cleared by the [place] FDA and  has been authorized for detection and/or diagnosis of SARS-CoV-2 by  FDA under an Emergency Use Authorization (EUA). This EUA will remain  in effect (meaning this test can be used) for the duration of the  Covid-19 declaration under Section 564(b)(1) of the Act, 21  U.S.C. section 360bbb-3(b)(1), unless the authorization is  terminated or revoked. Performed at [facility], [address] [place], [place] [address]   Culture, blood (routine x 2)     Status: None (Preliminary result)   Collection Time: 04/15/19  8:25 AM    Specimen: BLOOD RIGHT HAND  Result Value Ref Range Status   Specimen Description   Final    BLOOD RIGHT HAND Performed at [facility], [address] [place], [place] [address]    Special Requests   Final    BOTTLES DRAWN AEROBIC ONLY Blood Culture results may not be optimal due to an excessive volume of blood received in culture bottles Performed at [facility], [address] [place], [place] [address]    Culture   Final    NO GROWTH 4 DAYS Performed at [facility], [address] [place], [place] [address]    Report Status PENDING  Incomplete  Culture, blood (routine x 2)     Status: None (Preliminary result)   Collection Time: 04/15/19  8:30 AM   Specimen: BLOOD LEFT HAND  Result Value Ref Range Status  Specimen Description   Final    BLOOD LEFT HAND Performed at [facility], [address] [place], [place] [address]    Special Requests   Final    BOTTLES DRAWN AEROBIC ONLY Blood Culture results may not be optimal due to an inadequate volume of blood received in culture bottles Performed at [facility], [address], [place], [place] [address]    Culture   Final    NO GROWTH 4 DAYS Performed at [facility], [address] [place], [place] [address]    Report Status PENDING  Incomplete  SARS CORONAVIRUS 2 (TAT 6-24 HRS) Nasopharyngeal Nasopharyngeal Swab     Status: None   Collection Time: 04/15/19  8:46 AM   Specimen: Nasopharyngeal Swab  Result Value Ref Range Status   SARS Coronavirus 2 NEGATIVE NEGATIVE Final    Comment: (NOTE) SARS-CoV-2 target nucleic acids are NOT DETECTED. The SARS-CoV-2 RNA is generally detectable in upper and lower respiratory specimens during the acute phase of infection. Negative results do not preclude SARS-CoV-2 infection, do not rule out co-infections with other pathogens, and should not be used as the sole basis for treatment or other  patient management decisions. Negative results must be combined with clinical observations, patient history, and epidemiological information. The expected result is Negative. Fact Sheet for Patients: HairSlick.no Fact Sheet for Healthcare Providers: quierodirigir.com This test is not yet approved or cleared by the [place] FDA and  has been authorized for detection and/or diagnosis of SARS-CoV-2 by FDA under an Emergency Use Authorization (EUA). This EUA will remain  in effect (meaning this test can be used) for the duration of the COVID-19 declaration under Section 56 4(b)(1) of the Act, 21 U.S.C. section 360bbb-3(b)(1), unless the authorization is terminated or revoked sooner. Performed at [facility], [address] [place], [place] [address]   Culture, blood (routine x 2)     Status: None (Preliminary result)   Collection Time: 04/15/19  7:26 PM   Specimen: BLOOD  Result Value Ref Range Status   Specimen Description BLOOD RIGHT LOWER ARM  Final   Special Requests   Final    BOTTLES DRAWN AEROBIC AND ANAEROBIC Blood Culture adequate volume   Culture   Final    NO GROWTH 4 DAYS Performed at [facility], [address], [place], [place] [address]    Report Status PENDING  Incomplete  Culture, blood (routine x 2)     Status: Abnormal   Collection Time: 04/15/19  7:26 PM   Specimen: BLOOD  Result Value Ref Range Status   Specimen Description BLOOD RIGHT ARM  Final   Special Requests   Final    BOTTLES DRAWN AEROBIC AND ANAEROBIC Blood Culture adequate volume   Culture  Setup Time   Final    GRAM POSITIVE COCCI IN BOTH AEROBIC AND ANAEROBIC BOTTLES CRITICAL RESULT CALLED TO, READ BACK BY AND VERIFIED WITH: A. MEYER, PHARMD AT 1730 ON 04/16/19 BY C. JESSUP, MT.    Culture (A)  Final    STAPHYLOCOCCUS SPECIES (COAGULASE NEGATIVE) THE SIGNIFICANCE OF ISOLATING THIS ORGANISM FROM A SINGLE SET OF BLOOD  CULTURES WHEN MULTIPLE SETS ARE DRAWN IS UNCERTAIN. PLEASE NOTIFY THE MICROBIOLOGY DEPARTMENT WITHIN ONE WEEK IF SPECIATION AND SENSITIVITIES ARE REQUIRED. Performed at [facility], [address], [place], [place] [address]    Report Status 04/18/2019 FINAL  Final  Surgical pcr screen     Status: Abnormal  Collection Time: 04/16/19  6:09 PM   Specimen: Nasal Mucosa; Nasal Swab  Result Value Ref Range Status   MRSA, PCR POSITIVE (A) NEGATIVE Final    Comment: RESULT CALLED TO, READ BACK BY AND VERIFIED WITH: RAKHIMOVA,R RN 2206 04/16/2019 MITCHELL,L    Staphylococcus aureus POSITIVE (A) NEGATIVE Final    Comment: (NOTE) The Xpert SA Assay (FDA approved for NASAL specimens in patients 2 years of age and older), is one component of a comprehensive surveillance program. It is not intended to diagnose infection nor to guide or monitor treatment. Performed at [facility], [place] [address] [place], [place] [address]   Fungus Culture With Stain     Status: None (Preliminary result)   Collection Time: 04/17/19  7:28 AM   Specimen: PATH Other; Body Fluid  Result Value Ref Range Status   Fungus Stain Final report  Final    Comment: (NOTE) Performed At: [facility] [place], [place] [id number] Rush Farmer MD XB:[id number]    Fungus (Mycology) Culture PENDING  Incomplete   Fungal Source ABSCESS  Final    Comment: LEFT CHEST Performed at [facility], [place] [address], [place], [place] [address]   Aerobic/Anaerobic Culture (surgical/deep wound)     Status: None (Preliminary result)   Collection Time: 04/17/19  7:28 AM   Specimen: PATH Other; Body Fluid  Result Value Ref Range Status   Specimen Description ABSCESS LEFT CHEST  Final   Special Requests PATIENT ON FOLLOWING ZOSYN  Final   Gram Stain   Final    MODERATE WBC PRESENT, PREDOMINANTLY PMN NO ORGANISMS SEEN Performed at [facility], [address], [place], [place] [address]     Culture   Final    FEW STAPHYLOCOCCUS AUREUS NO ANAEROBES ISOLATED; CULTURE IN PROGRESS FOR 5 DAYS    Report Status PENDING  Incomplete   Organism ID, Bacteria STAPHYLOCOCCUS AUREUS  Final      Susceptibility   Staphylococcus aureus - MIC*    CIPROFLOXACIN >=8 RESISTANT Resistant     ERYTHROMYCIN >=8 RESISTANT Resistant     GENTAMICIN <=0.5 SENSITIVE Sensitive     OXACILLIN 0.5 SENSITIVE Sensitive     TETRACYCLINE <=1 SENSITIVE Sensitive     VANCOMYCIN 1 SENSITIVE Sensitive     TRIMETH/SULFA <=10 SENSITIVE Sensitive     CLINDAMYCIN <=0.25 SENSITIVE Sensitive     RIFAMPIN <=0.5 SENSITIVE Sensitive     Inducible Clindamycin NEGATIVE Sensitive     * FEW STAPHYLOCOCCUS AUREUS  Acid Fast Smear (AFB)     Status: None   Collection Time: 04/17/19  7:28 AM   Specimen: PATH Other; Body Fluid  Result Value Ref Range Status   AFB Specimen Processing Concentration  Final   Acid Fast Smear Negative  Final    Comment: (NOTE) Performed At: [facility] [place], [place] [id number] Rush Farmer MD KG:[id number]    Source (AFB) ABSCESS  Final    Comment: LEFT CHEST Performed at [facility], [place] [address] [place], [place] [address]   Fungus Culture Result     Status: None   Collection Time: 04/17/19  7:28 AM  Result Value Ref Range Status   Result 1 Comment  Final    Comment: (NOTE) KOH/Calcofluor preparation:  no fungus observed. Performed At: [facility] [place], [place] [id number] Rush Farmer MD OV:[id number]     Procedures/Studies: CT Soft Tissue Neck W Contrast  Result Date: 04/15/2019  CLINICAL DATA:  Initial evaluation for neck abscess. EXAM: CT NECK WITH CONTRAST TECHNIQUE: Multidetector CT imaging of the neck was performed using the standard protocol following the bolus administration of intravenous contrast. CONTRAST:  75mL OMNIPAQUE IOHEXOL 300 MG/ML  SOLN COMPARISON:  None. FINDINGS: Pharynx and larynx: Oral cavity  within normal limits. Palatine tonsils symmetric and within normal limits. Parapharyngeal fat maintained. Nasopharynx and oropharynx within normal limits. No retropharyngeal collection. Epiglottis normal. Vallecula clear. Remainder of the hypopharynx and supraglottic larynx within normal limits. True cords symmetric and normal. Subglottic airway clear. Salivary glands: Salivary glands including the parotid and submandibular glands are within normal limits. Thyroid: Normal. Lymph nodes: No pathologically enlarged lymph nodes seen within the neck. Vascular: Normal intravascular enhancement seen throughout the neck. Limited intracranial: Unremarkable. Visualized orbits: Unremarkable. Mastoids and visualized paranasal sinuses: Mild mucosal thickening within the right maxillary sinus. Visualized paranasal sinuses are otherwise clear. Mastoid air cells and middle ear cavities are well pneumatized and free of fluid. Skeleton: No acute osseous abnormality. No discrete or worrisome osseous lesions. Prominent dental caries noted at the left second mandibular molar as well as the right second maxillary molar. Upper chest: Visualized lungs are clear. Other: Extensive soft tissue swelling with inflammatory stranding seen involving the left paramedian soft tissues of the lower anterior neck, just superior to the left sternoclavicular joint, consistent with cellulitis. Inflammatory stranding extends into the left upper mediastinum. There is a superimposed rim enhancing hypodense collection measuring 3.4 x 2.1 x 2.5 cm, consistent with abscess (series 2, image 102). This is closely positioned to the adjacent left sternoclavicular joint, which could conceivably be infected, although no definite osseous erosive changes are seen to suggest osteomyelitis. Inflammatory stranding with swelling extends inferiorly into the left anterior upper chest wall as well. IMPRESSION: Extensive soft tissue swelling with inflammatory stranding  involving the left paramedian soft tissues of the lower anterior neck, just superior to the left sternoclavicular joint, consistent with cellulitis/infection. Superimposed 3.4 x 2.1 x 2.5 cm collection within this region, consistent with abscess. This is closely positioned to the adjacent left sternoclavicular joint, which could conceivably be infected, although no definite osseous erosive changes are seen to suggest osteomyelitis. Electronically Signed   By: Rise Mu M.D.   On: 04/15/2019 05:44   ECHOCARDIOGRAM COMPLETE  Result Date: 04/15/2019   ECHOCARDIOGRAM REPORT   Patient Name:   Grant Tapia Date of Exam: 04/15/2019 Medical Rec #:  [id number]              Height:       70.0 in Accession #:    [id number]             Weight:       185.0 lb Date of Birth:  [birth year]             BSA:          2.02 m Patient Age:    33 years               BP:           141/105 mmHg Patient Gender: M                      HR:           104 bpm. Exam Location:  Inpatient Procedure: 2D Echo, Cardiac Doppler and Color Doppler Indications:    Endocarditis I38  History:        Patient has prior history  of Echocardiogram examinations, most                 recent 01/08/2019. Signs/Symptoms:Chest Pain; Risk                 Factors:Hypertension and Former Smoker. Sepsis.  Sonographer:    Tonia GhentJulia Underwood RDCS Referring Phys: [id number] Teddy SpikeYRONE A KYLE IMPRESSIONS  1. Left ventricular ejection fraction, by visual estimation, is 55 to 60%. The left ventricle has normal function. There is no left ventricular hypertrophy.  2. The left ventricle has no regional wall motion abnormalities.  3. Global right ventricle has normal systolic function.The right ventricular size is normal. No increase in right ventricular wall thickness.  4. Left atrial size was normal.  5. Right atrial size was normal.  6. The mitral valve is normal in structure. No evidence of mitral valve regurgitation. No evidence of mitral stenosis.  7. The tricuspid  valve is normal in structure.  8. The aortic valve is normal in structure. Aortic valve regurgitation is not visualized. No evidence of aortic valve sclerosis or stenosis.  9. The pulmonic valve was normal in structure. Pulmonic valve regurgitation is not visualized. 10. Normal pulmonary artery systolic pressure. 11. The inferior vena cava is normal in size with greater than 50% respiratory variability, suggesting right atrial pressure of 3 mmHg. 12. No evidence for endocarditis. FINDINGS  Left Ventricle: Left ventricular ejection fraction, by visual estimation, is 55 to 60%. The left ventricle has normal function. The left ventricle has no regional wall motion abnormalities. There is no left ventricular hypertrophy. Normal left atrial pressure. Right Ventricle: The right ventricular size is normal. No increase in right ventricular wall thickness. Global RV systolic function is has normal systolic function. The tricuspid regurgitant velocity is 1.94 m/s, and with an assumed right atrial pressure  of 3 mmHg, the estimated right ventricular systolic pressure is normal at 18.1 mmHg. Left Atrium: Left atrial size was normal in size. Right Atrium: Right atrial size was normal in size Pericardium: There is no evidence of pericardial effusion. Mitral Valve: The mitral valve is normal in structure. No evidence of mitral valve regurgitation. No evidence of mitral valve stenosis by observation. Tricuspid Valve: The tricuspid valve is normal in structure. Tricuspid valve regurgitation is mild. Aortic Valve: The aortic valve is normal in structure. Aortic valve regurgitation is not visualized. The aortic valve is structurally normal, with no evidence of sclerosis or stenosis. Pulmonic Valve: The pulmonic valve was normal in structure. Pulmonic valve regurgitation is not visualized. Pulmonic regurgitation is not visualized. Aorta: The aortic root, ascending aorta and aortic arch are all structurally normal, with no evidence of  dilitation or obstruction. Venous: The inferior vena cava is normal in size with greater than 50% respiratory variability, suggesting right atrial pressure of 3 mmHg. IAS/Shunts: No atrial level shunt detected by color flow Doppler. There is no evidence of a patent foramen ovale. No ventricular septal defect is seen or detected. There is no evidence of an atrial septal defect.  LEFT VENTRICLE PLAX 2D LVIDd:         5.10 cm       Diastology LVIDs:         3.60 cm       LV e' lateral:   13.20 cm/s LV PW:         0.70 cm       LV E/e' lateral: 6.8 LV IVS:        0.70 cm  LV e' medial:    11.10 cm/s LVOT diam:     1.90 cm       LV E/e' medial:  8.1 LV SV:         69 ml LV SV Index:   33.86 LVOT Area:     2.84 cm  LV Volumes (MOD) LV area d, A2C:    34.90 cm LV area d, A4C:    40.50 cm LV area s, A2C:    25.40 cm LV area s, A4C:    23.40 cm LV major d, A2C:   9.15 cm LV major d, A4C:   9.99 cm LV major s, A2C:   8.15 cm LV major s, A4C:   7.98 cm LV vol d, MOD A2C: 110.0 ml LV vol d, MOD A4C: 135.0 ml LV vol s, MOD A2C: 67.2 ml LV vol s, MOD A4C: 58.4 ml LV SV MOD A2C:     42.8 ml LV SV MOD A4C:     135.0 ml LV SV MOD BP:      63.6 ml RIGHT VENTRICLE RV S prime:     15.40 cm/s TAPSE (M-mode): 2.4 cm LEFT ATRIUM             Index       RIGHT ATRIUM           Index LA diam:        3.60 cm 1.78 cm/m  RA Area:     14.40 cm LA Vol (A2C):   37.9 ml 18.77 ml/m RA Volume:   36.00 ml  17.83 ml/m LA Vol (A4C):   38.9 ml 19.26 ml/m LA Biplane Vol: 42.3 ml 20.95 ml/m  AORTIC VALVE LVOT Vmax:   102.00 cm/s LVOT Vmean:  72.300 cm/s LVOT VTI:    0.170 m  AORTA Ao Root diam: 2.70 cm MITRAL VALVE                        TRICUSPID VALVE MV Area (PHT): 8.43 cm             TR Peak grad:   15.1 mmHg MV PHT:        26.10 msec           TR Vmax:        194.00 cm/s MV Decel Time: 90 msec MV E velocity: 90.30 cm/s 103 cm/s  SHUNTS MV A velocity: 72.70 cm/s 70.3 cm/s Systemic VTI:  0.17 m MV E/A ratio:  1.24       1.5        Systemic Diam: 1.90 cm  Tobias Alexander MD Electronically signed by Tobias Alexander MD Signature Date/Time: 04/15/2019/1:08:47 PM    Final      Labs: BNP (last 3 results) No results for input(s): BNP in the last 8760 hours. Basic Metabolic Panel: Recent Labs  Lab 04/14/19 2214 04/17/19 0421 04/20/19 0256  NA 138 138 141  K 4.2 3.7 3.6  CL 103 105 109  CO2 25 23 24   GLUCOSE 77 106* 113*  BUN 16 5* 13  CREATININE 1.02 0.71 0.73  CALCIUM 8.5* 8.4* 8.6*   Liver Function Tests: Recent Labs  Lab 04/14/19 2214 04/17/19 0421  AST 64* 31  ALT 66* 42  ALKPHOS 49 41  BILITOT 1.1 0.5  PROT 7.1 6.6  ALBUMIN 3.5 2.5*   No results for input(s): LIPASE, AMYLASE in the last 168 hours. No results for input(s): AMMONIA in the last 168 hours.  CBC: Recent Labs  Lab 04/14/19 2214 04/17/19 0421 04/20/19 0256  WBC 12.7* 9.1 7.5  NEUTROABS 9.1* 5.7  --   HGB 12.0* 11.9* 12.3*  HCT 37.7* 36.4* 38.1*  MCV 92.0 90.3 89.9  PLT 228 255 300   Cardiac Enzymes: No results for input(s): CKTOTAL, CKMB, CKMBINDEX, TROPONINI in the last 168 hours. BNP: Invalid input(s): POCBNP CBG: No results for input(s): GLUCAP in the last 168 hours. D-Dimer No results for input(s): DDIMER in the last 72 hours. Hgb A1c No results for input(s): HGBA1C in the last 72 hours. Lipid Profile No results for input(s): CHOL, HDL, LDLCALC, TRIG, CHOLHDL, LDLDIRECT in the last 72 hours. Thyroid function studies No results for input(s): TSH, T4TOTAL, T3FREE, THYROIDAB in the last 72 hours.  Invalid input(s): FREET3 Anemia work up No results for input(s): VITAMINB12, FOLATE, FERRITIN, TIBC, IRON, RETICCTPCT in the last 72 hours. Urinalysis    Component Value Date/Time   COLORURINE YELLOW 12/30/2016 0215   APPEARANCEUR CLEAR 12/30/2016 0215   LABSPEC 1.021 12/30/2016 0215   PHURINE 5.0 12/30/2016 0215   GLUCOSEU NEGATIVE 12/30/2016 0215   HGBUR NEGATIVE 12/30/2016 0215   BILIRUBINUR NEGATIVE 12/30/2016 0215    KETONESUR NEGATIVE 12/30/2016 0215   PROTEINUR NEGATIVE 12/30/2016 0215   UROBILINOGEN 0.2 03/26/2014 2255   NITRITE NEGATIVE 12/30/2016 0215   LEUKOCYTESUR NEGATIVE 12/30/2016 0215   Sepsis Labs Invalid input(s): PROCALCITONIN,  WBC,  LACTICIDVEN Microbiology Recent Results (from the past 240 hour(s))  Respiratory Panel by RT PCR (Flu A&B, Covid) - Nasopharyngeal Swab     Status: None   Collection Time: 04/15/19  7:07 AM   Specimen: Nasopharyngeal Swab  Result Value Ref Range Status   SARS Coronavirus 2 by RT PCR NEGATIVE NEGATIVE Final    Comment: (NOTE) SARS-CoV-2 target nucleic acids are NOT DETECTED. The SARS-CoV-2 RNA is generally detectable in upper respiratoy specimens during the acute phase of infection. The lowest concentration of SARS-CoV-2 viral copies this assay can detect is 131 copies/mL. A negative result does not preclude SARS-Cov-2 infection and should not be used as the sole basis for treatment or other patient management decisions. A negative result may occur with  improper specimen collection/handling, submission of specimen other than nasopharyngeal swab, presence of viral mutation(s) within the areas targeted by this assay, and inadequate number of viral copies (<131 copies/mL). A negative result must be combined with clinical observations, patient history, and epidemiological information. The expected result is Negative. Fact Sheet for Patients:  https://www.moore.com/ Fact Sheet for Healthcare Providers:  https://www.young.biz/ This test is not yet ap proved or cleared by the [place] FDA and  has been authorized for detection and/or diagnosis of SARS-CoV-2 by FDA under an Emergency Use Authorization (EUA). This EUA will remain  in effect (meaning this test can be used) for the duration of the COVID-19 declaration under Section 564(b)(1) of the Act, 21 U.S.C. section 360bbb-3(b)(1), unless the authorization  is terminated or revoked sooner.    Influenza A by PCR NEGATIVE NEGATIVE Final   Influenza B by PCR NEGATIVE NEGATIVE Final    Comment: (NOTE) The Xpert Xpress SARS-CoV-2/FLU/RSV assay is intended as an aid in  the diagnosis of influenza from Nasopharyngeal swab specimens and  should not be used as a sole basis for treatment. Nasal washings and  aspirates are unacceptable for Xpert Xpress SARS-CoV-2/FLU/RSV  testing. Fact Sheet for Patients: https://www.moore.com/ Fact Sheet for Healthcare Providers: https://www.young.biz/ This test is not yet approved or cleared by the [place] and  has been authorized for detection and/or diagnosis of SARS-CoV-2 by  FDA under an Emergency Use Authorization (EUA). This EUA will remain  in effect (meaning this test can be used) for the duration of the  Covid-19 declaration under Section 564(b)(1) of the Act, 21  U.S.C. section 360bbb-3(b)(1), unless the authorization is  terminated or revoked. Performed at [facility], [address], [place], [place] [address]   Culture, blood (routine x 2)     Status: None (Preliminary result)   Collection Time: 04/15/19  8:25 AM   Specimen: BLOOD RIGHT HAND  Result Value Ref Range Status   Specimen Description   Final    BLOOD RIGHT HAND Performed at [facility], [address] [place], [place] [address]    Special Requests   Final    BOTTLES DRAWN AEROBIC ONLY Blood Culture results may not be optimal due to an excessive volume of blood received in culture bottles Performed at [facility], [address] [place], [place] [address]    Culture   Final    NO GROWTH 4 DAYS Performed at [facility], [address], [place], [place] [address]    Report Status PENDING  Incomplete  Culture, blood (routine x 2)     Status: None (Preliminary result)   Collection Time: 04/15/19  8:30 AM   Specimen:  BLOOD LEFT HAND  Result Value Ref Range Status   Specimen Description   Final    BLOOD LEFT HAND Performed at [facility], [address], [place], [place] [address]    Special Requests   Final    BOTTLES DRAWN AEROBIC ONLY Blood Culture results may not be optimal due to an inadequate volume of blood received in culture bottles Performed at [facility], [address], [place], [place] [address]    Culture   Final    NO GROWTH 4 DAYS Performed at [facility], [address], [place], [place] [address]    Report Status PENDING  Incomplete  SARS CORONAVIRUS 2 (TAT 6-24 HRS) Nasopharyngeal Nasopharyngeal Swab     Status: None   Collection Time: 04/15/19  8:46 AM   Specimen: Nasopharyngeal Swab  Result Value Ref Range Status   SARS Coronavirus 2 NEGATIVE NEGATIVE Final    Comment: (NOTE) SARS-CoV-2 target nucleic acids are NOT DETECTED. The SARS-CoV-2 RNA is generally detectable in upper and lower respiratory specimens during the acute phase of infection. Negative results do not preclude SARS-CoV-2 infection, do not rule out co-infections with other pathogens, and should not be used as the sole basis for treatment or other patient management decisions. Negative results must be combined with clinical observations, patient history, and epidemiological information. The expected result is Negative. Fact Sheet for Patients: HairSlick.no Fact Sheet for Healthcare Providers: quierodirigir.com This test is not yet approved or cleared by the [place] FDA and  has been authorized for detection and/or diagnosis of SARS-CoV-2 by FDA under an Emergency Use Authorization (EUA). This EUA will remain  in effect (meaning this test can be used) for the duration of the COVID-19 declaration under Section 56 4(b)(1) of the Act, 21 U.S.C. section 360bbb-3(b)(1), unless the authorization is terminated  or revoked sooner. Performed at [facility], [address] [place], [place] [address]   Culture, blood (routine x 2)     Status: None (Preliminary result)   Collection Time: 04/15/19  7:26 PM   Specimen: BLOOD  Result Value Ref Range Status   Specimen Description BLOOD RIGHT LOWER ARM  Final   Special Requests   Final    BOTTLES DRAWN AEROBIC AND ANAEROBIC Blood Culture adequate volume   Culture   Final    NO GROWTH 4 DAYS Performed at [facility], [address] [place], [place] [address]    Report Status PENDING  Incomplete  Culture, blood (routine x 2)     Status: Abnormal   Collection Time: 04/15/19  7:26 PM   Specimen: BLOOD  Result Value Ref Range Status   Specimen Description BLOOD RIGHT ARM  Final   Special Requests   Final    BOTTLES DRAWN AEROBIC AND ANAEROBIC Blood Culture adequate volume   Culture  Setup Time   Final    GRAM POSITIVE COCCI IN BOTH AEROBIC AND ANAEROBIC BOTTLES CRITICAL RESULT CALLED TO, READ BACK BY AND VERIFIED WITH: A. MEYER, PHARMD AT 1730 ON 04/16/19 BY C. JESSUP, MT.    Culture (A)  Final    STAPHYLOCOCCUS SPECIES (COAGULASE NEGATIVE) THE SIGNIFICANCE OF ISOLATING THIS ORGANISM FROM A SINGLE SET OF BLOOD CULTURES WHEN MULTIPLE SETS ARE DRAWN IS UNCERTAIN. PLEASE NOTIFY THE MICROBIOLOGY DEPARTMENT WITHIN ONE WEEK IF SPECIATION AND SENSITIVITIES ARE REQUIRED. Performed at [facility], [address] [place], [place] [address]    Report Status 04/18/2019 FINAL  Final  Surgical pcr screen     Status: Abnormal   Collection Time: 04/16/19  6:09 PM   Specimen: Nasal Mucosa; Nasal Swab  Result Value Ref Range Status   MRSA, PCR POSITIVE (A) NEGATIVE Final    Comment: RESULT CALLED TO, READ BACK BY AND VERIFIED WITH: RAKHIMOVA,R RN 2206 04/16/2019 MITCHELL,L    Staphylococcus aureus POSITIVE (A) NEGATIVE Final    Comment: (NOTE) The Xpert SA Assay (FDA approved for NASAL specimens in patients 5 years of age and older), is  one component of a comprehensive surveillance program. It is not intended to diagnose infection nor to guide or monitor treatment. Performed at [facility], [address], [place], [place] [address]   Fungus Culture With Stain     Status: None (Preliminary result)   Collection Time: 04/17/19  7:28 AM   Specimen: PATH Other; Body Fluid  Result Value Ref Range Status   Fungus Stain Final report  Final    Comment: (NOTE) Performed At: [facility] [address] [place], [place] [id number] Jolene Schimke MD GE:[id number]    Fungus (Mycology) Culture PENDING  Incomplete   Fungal Source ABSCESS  Final    Comment: LEFT CHEST Performed at [facility], [address], [place], [place] [address]   Aerobic/Anaerobic Culture (surgical/deep wound)     Status: None (Preliminary result)   Collection Time: 04/17/19  7:28 AM   Specimen: PATH Other; Body Fluid  Result Value Ref Range Status   Specimen Description ABSCESS LEFT CHEST  Final   Special Requests PATIENT ON FOLLOWING ZOSYN  Final   Gram Stain   Final    MODERATE WBC PRESENT, PREDOMINANTLY PMN NO ORGANISMS SEEN Performed at [facility], [address] [place], [place] [address]    Culture   Final    FEW STAPHYLOCOCCUS AUREUS NO ANAEROBES ISOLATED; CULTURE IN PROGRESS FOR 5 DAYS    Report Status PENDING  Incomplete   Organism ID, Bacteria STAPHYLOCOCCUS AUREUS  Final      Susceptibility   Staphylococcus aureus - MIC*    CIPROFLOXACIN >=8  RESISTANT Resistant     ERYTHROMYCIN >=8 RESISTANT Resistant     GENTAMICIN <=0.5 SENSITIVE Sensitive     OXACILLIN 0.5 SENSITIVE Sensitive     TETRACYCLINE <=1 SENSITIVE Sensitive     VANCOMYCIN 1 SENSITIVE Sensitive     TRIMETH/SULFA <=10 SENSITIVE Sensitive     CLINDAMYCIN <=0.25 SENSITIVE Sensitive     RIFAMPIN <=0.5 SENSITIVE Sensitive     Inducible Clindamycin NEGATIVE Sensitive     * FEW STAPHYLOCOCCUS AUREUS  Acid Fast Smear (AFB)     Status: None    Collection Time: 04/17/19  7:28 AM   Specimen: PATH Other; Body Fluid  Result Value Ref Range Status   AFB Specimen Processing Concentration  Final   Acid Fast Smear Negative  Final    Comment: (NOTE) Performed At: [facility] [address] [place], [place] [id number] Jolene Schimke MD WU:[id number]    Source (AFB) ABSCESS  Final    Comment: LEFT CHEST Performed at [facility], [address] [place], [place] [address]   Fungus Culture Result     Status: None   Collection Time: 04/17/19  7:28 AM  Result Value Ref Range Status   Result 1 Comment  Final    Comment: (NOTE) KOH/Calcofluor preparation:  no fungus observed. Performed At: [facility] [address] [place], [place] [id number] Jolene Schimke MD [id number]      Time coordinating discharge: 25  minutes  SIGNED: Lanae Boast, MD  [facility] 04/20/2019, 9:25 AM  If 7PM-7AM, please contact night-coverage [email or web address]

## 2019-04-20 NOTE — Plan of Care (Signed)

## 2019-04-20 NOTE — Progress Notes (Signed)
AVS given and reviewed with pt. Medications delivered to bedside by transitions of care pharmacy and discussed with pt. All questions answered to satisfaction. Pt verbalized understanding of information given. Pt to be escorted off the unit with all belongings via wheelchair by volunteer services. 

## 2019-04-22 LAB — AEROBIC/ANAEROBIC CULTURE W GRAM STAIN (SURGICAL/DEEP WOUND)

## 2019-04-27 ENCOUNTER — Ambulatory Visit: Payer: Self-pay

## 2019-04-27 NOTE — Telephone Encounter (Signed)
Pt called and stated that he was discharged from the hospital. Pt states that he is taking new medications and would like a call back from the nurse regarding medication reactions

## 2019-05-16 LAB — FUNGUS CULTURE WITH STAIN

## 2019-05-16 LAB — FUNGUS CULTURE RESULT

## 2019-05-16 LAB — FUNGAL ORGANISM REFLEX

## 2019-06-01 LAB — ACID FAST CULTURE WITH REFLEXED SENSITIVITIES (MYCOBACTERIA): Acid Fast Culture: NEGATIVE

## 2019-07-22 ENCOUNTER — Inpatient Hospital Stay (HOSPITAL_BASED_OUTPATIENT_CLINIC_OR_DEPARTMENT_OTHER)
Admission: EM | Admit: 2019-07-22 | Discharge: 2019-07-26 | DRG: 871 | Disposition: A | Discharge status: Home / Self Care | Payer: Self-pay | Attending: Internal Medicine | Admitting: Internal Medicine

## 2019-07-22 ENCOUNTER — Encounter (HOSPITAL_BASED_OUTPATIENT_CLINIC_OR_DEPARTMENT_OTHER): Payer: Self-pay | Admitting: Emergency Medicine

## 2019-07-22 ENCOUNTER — Emergency Department (HOSPITAL_BASED_OUTPATIENT_CLINIC_OR_DEPARTMENT_OTHER): Payer: Self-pay

## 2019-07-22 ENCOUNTER — Other Ambulatory Visit: Payer: Self-pay

## 2019-07-22 DIAGNOSIS — M25461 Effusion, right knee: Secondary | ICD-10-CM | POA: Diagnosis present

## 2019-07-22 DIAGNOSIS — U071 COVID-19: Secondary | ICD-10-CM | POA: Diagnosis present

## 2019-07-22 DIAGNOSIS — F1113 Opioid abuse with withdrawal: Secondary | ICD-10-CM | POA: Diagnosis present

## 2019-07-22 DIAGNOSIS — Z87891 Personal history of nicotine dependence: Secondary | ICD-10-CM

## 2019-07-22 DIAGNOSIS — Z79899 Other long term (current) drug therapy: Secondary | ICD-10-CM

## 2019-07-22 DIAGNOSIS — F1990 Other psychoactive substance use, unspecified, uncomplicated: Secondary | ICD-10-CM | POA: Diagnosis present

## 2019-07-22 DIAGNOSIS — M009 Pyogenic arthritis, unspecified: Secondary | ICD-10-CM | POA: Diagnosis present

## 2019-07-22 DIAGNOSIS — M79661 Pain in right lower leg: Secondary | ICD-10-CM

## 2019-07-22 DIAGNOSIS — M7989 Other specified soft tissue disorders: Secondary | ICD-10-CM

## 2019-07-22 DIAGNOSIS — R6520 Severe sepsis without septic shock: Secondary | ICD-10-CM | POA: Diagnosis present

## 2019-07-22 DIAGNOSIS — I10 Essential (primary) hypertension: Secondary | ICD-10-CM | POA: Diagnosis present

## 2019-07-22 DIAGNOSIS — M25561 Pain in right knee: Secondary | ICD-10-CM | POA: Diagnosis present

## 2019-07-22 DIAGNOSIS — L03115 Cellulitis of right lower limb: Secondary | ICD-10-CM | POA: Diagnosis present

## 2019-07-22 DIAGNOSIS — Z91030 Bee allergy status: Secondary | ICD-10-CM

## 2019-07-22 DIAGNOSIS — R000 Tachycardia, unspecified: Secondary | ICD-10-CM | POA: Diagnosis present

## 2019-07-22 DIAGNOSIS — A419 Sepsis, unspecified organism: Principal | ICD-10-CM | POA: Diagnosis present

## 2019-07-22 DIAGNOSIS — N179 Acute kidney failure, unspecified: Secondary | ICD-10-CM | POA: Diagnosis present

## 2019-07-22 DIAGNOSIS — E876 Hypokalemia: Secondary | ICD-10-CM | POA: Diagnosis not present

## 2019-07-22 LAB — COMPREHENSIVE METABOLIC PANEL
ALT: 32 U/L (ref 0–44)
AST: 33 U/L (ref 15–41)
Albumin: 2.7 g/dL — ABNORMAL LOW (ref 3.5–5.0)
Alkaline Phosphatase: 76 U/L (ref 38–126)
Anion gap: 10 (ref 5–15)
BUN: 16 mg/dL (ref 6–20)
CO2: 24 mmol/L (ref 22–32)
Calcium: 8.5 mg/dL — ABNORMAL LOW (ref 8.9–10.3)
Chloride: 101 mmol/L (ref 98–111)
Creatinine, Ser: 1.25 mg/dL — ABNORMAL HIGH (ref 0.61–1.24)
GFR calc Af Amer: >60 mL/min (ref >60)
GFR calc non Af Amer: >60 mL/min (ref >60)
Glucose, Bld: 132 mg/dL — ABNORMAL HIGH (ref 70–99)
Potassium: 3.6 mmol/L (ref 3.5–5.1)
Sodium: 135 mmol/L (ref 135–145)
Total Bilirubin: 0.7 mg/dL (ref 0.3–1.2)
Total Protein: 7.2 g/dL (ref 6.5–8.1)

## 2019-07-22 LAB — CBC WITH DIFFERENTIAL/PLATELET
Abs Immature Granulocytes: 1.59 10*3/uL — ABNORMAL HIGH (ref 0.00–0.07)
Basophils Absolute: 0.1 10*3/uL (ref 0.0–0.1)
Basophils Relative: 0 %
Eosinophils Absolute: 0.3 10*3/uL (ref 0.0–0.5)
Eosinophils Relative: 1 %
HCT: 36.6 % — ABNORMAL LOW (ref 39.0–52.0)
Hemoglobin: 11.8 g/dL — ABNORMAL LOW (ref 13.0–17.0)
Immature Granulocytes: 6 %
Lymphocytes Relative: 5 %
Lymphs Abs: 1.4 10*3/uL (ref 0.7–4.0)
MCH: 28.9 pg (ref 26.0–34.0)
MCHC: 32.2 g/dL (ref 30.0–36.0)
MCV: 89.5 fL (ref 80.0–100.0)
Monocytes Absolute: 1.5 10*3/uL — ABNORMAL HIGH (ref 0.1–1.0)
Monocytes Relative: 5 %
Neutro Abs: 23.7 10*3/uL — ABNORMAL HIGH (ref 1.7–7.7)
Neutrophils Relative %: 83 %
Platelets: 286 10*3/uL (ref 150–400)
RBC: 4.09 MIL/uL — ABNORMAL LOW (ref 4.22–5.81)
RDW: 14.6 % (ref 11.5–15.5)
Smear Review: NORMAL
WBC: 28.6 10*3/uL — ABNORMAL HIGH (ref 4.0–10.5)
nRBC: 0 % (ref 0.0–0.2)

## 2019-07-22 LAB — PROTIME-INR
INR: 1.2 (ref 0.8–1.2)
Prothrombin Time: 15.5 seconds — ABNORMAL HIGH (ref 11.4–15.2)

## 2019-07-22 LAB — SEDIMENTATION RATE: Sed Rate: 63 mm/hr — ABNORMAL HIGH (ref 0–16)

## 2019-07-22 LAB — C-REACTIVE PROTEIN: CRP: 32.5 mg/dL — ABNORMAL HIGH (ref <1.0)

## 2019-07-22 LAB — LACTIC ACID, PLASMA: Lactic Acid, Venous: 2.2 mmol/L (ref 0.5–1.9)

## 2019-07-22 MED ORDER — KETAMINE HCL 10 MG/ML IJ SOLN
0.3000 mg/kg | Freq: Once | INTRAMUSCULAR | Status: AC
  Administered 2019-07-22: 23:00:00 23 mg via INTRAVENOUS
  Filled 2019-07-22: qty 1

## 2019-07-22 MED ORDER — PIPERACILLIN-TAZOBACTAM 3.375 G IVPB 30 MIN
3.3750 g | Freq: Once | INTRAVENOUS | Status: AC
  Administered 2019-07-22: 23:00:00 3.375 g via INTRAVENOUS
  Filled 2019-07-22 (×2): qty 50

## 2019-07-22 MED ORDER — PENTAFLUOROPROP-TETRAFLUOROETH EX AERO
INHALATION_SPRAY | CUTANEOUS | Status: DC | PRN
  Filled 2019-07-22: qty 30

## 2019-07-22 MED ORDER — SODIUM CHLORIDE 0.9 % IV BOLUS
1000.0000 mL | Freq: Once | INTRAVENOUS | Status: AC
  Administered 2019-07-22: 1000 mL via INTRAVENOUS

## 2019-07-22 MED ORDER — SODIUM CHLORIDE 0.9 % IV SOLN: INTRAVENOUS | Status: AC

## 2019-07-22 MED ORDER — LORAZEPAM 2 MG/ML IJ SOLN
1.0000 mg | Freq: Once | INTRAMUSCULAR | Status: AC
  Administered 2019-07-22: 1 mg via INTRAVENOUS
  Filled 2019-07-22: qty 1

## 2019-07-22 MED ORDER — PENTAFLUOROPROP-TETRAFLUOROETH EX AERO
INHALATION_SPRAY | CUTANEOUS | Status: AC
  Filled 2019-07-22: qty 30

## 2019-07-22 MED ORDER — MORPHINE SULFATE (PF) 4 MG/ML IV SOLN
4.0000 mg | Freq: Once | INTRAVENOUS | Status: AC
  Administered 2019-07-22: 4 mg via INTRAVENOUS
  Filled 2019-07-22: qty 1

## 2019-07-22 MED ORDER — VANCOMYCIN HCL IN DEXTROSE 1-5 GM/200ML-% IV SOLN
1000.0000 mg | Freq: Once | INTRAVENOUS | Status: AC
  Administered 2019-07-22: 1000 mg via INTRAVENOUS
  Filled 2019-07-22: qty 200

## 2019-07-22 NOTE — ED Notes (Signed)
Resting quietly at this time with no distress noted.

## 2019-07-22 NOTE — ED Notes (Signed)
Lactic level of 2.2. Dr. Clarene Duke and primary RN Annia Friendly aware.

## 2019-07-22 NOTE — ED Notes (Signed)
Noted to be hallucinating after receiving ketamine.  Dr. Blinda Leatherwood at the bedside.

## 2019-07-22 NOTE — ED Notes (Signed)
[facility] notified (Kim) - Patient ready for transport 

## 2019-07-22 NOTE — ED Notes (Signed)
Provider at the bedside.  

## 2019-07-22 NOTE — ED Triage Notes (Signed)
Patient states that he " shot up" a couple of days ago and started to have right knee pain yesterday - he woke up this am and his right knee is swollen 4 times of normal, he has some sores to his right lower abdominal region. Denies any injections into his knee or stomach. The patient is states that the pain radiates down to his right calf

## 2019-07-22 NOTE — ED Notes (Signed)
[place] ([place]) [phone]

## 2019-07-22 NOTE — ED Provider Notes (Signed)
[facility] EMERGENCY DEPARTMENT Provider Note   CSN: [id number] Arrival date & time: 07/22/19  1933     History Chief Complaint  Patient presents with  . Knee Pain    Grant Tapia is a 34 y.o. male.  34yo M w/ PMH including IVDU, polysubstance abuse who p/w R knee pain.  Patient began having right knee pain yesterday, no injury or trauma.  He woke up this morning and his knee was very swollen and pain became severe.  His swelling has extended into his right lower leg.  He is also noticed some sores on his right abdominal wall.  He injects in his right arm but no other areas.  He has had sweats and chills but is not aware of any fevers.  No vomiting, URI symptoms, chest pain, or shortness of breath.  No other joint pains.  Last IV drug use was heroin 2 days ago.  The history is provided by the patient.  Knee Pain      Past Medical History:  Diagnosis Date  . Heroin abuse ([place])   . Hypertension   . IV drug user   . Neck abscess 04/2019  . Overdose   . Polysubstance abuse [facility])     Patient Active Problem List   Diagnosis Date Noted  . Right knee skin infection 07/22/2019  . HTN (hypertension), benign 04/15/2019  . Poisoning by heroin, accidental (unintentional), initial encounter ([place]) 05/08/2017  . Heroin dependence ([place]) 05/08/2017  . Back pain 12/30/2016  . Abnormal LFTs 12/30/2016  . Polysubstance abuse ([place])   . Cellulitis 11/19/2016  . IV drug user 11/19/2016  . Neck abscess 11/19/2016  . Delirium   . Tachycardia   . Overdose 07/18/2016  . Drug withdrawal ([place]) 07/16/2016  . AKI (acute kidney injury) ([place]) 07/15/2016  . Alcohol abuse 07/15/2016  . Heroin abuse ([place]) 07/15/2016  . 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 Cocaine abuse ([place]) 07/15/2016  . Acute metabolic encephalopathy 97/05/6376  . Lactic acidosis 03/29/2014  . Sepsis ([place]) 03/27/2014  . Chest pain   . Headache   . Neck pain     Past Surgical History:  Procedure  Laterality Date  . I & D EXTREMITY Left 11/19/2016   Procedure: INCISION AND DRAINAGE LEFT ELBOW  ABSCESS;  Surgeon: Altamese St. Lawrence, MD;  Location: [facility];  Service: Orthopedics;  Laterality: Left;  . INCISION AND DRAINAGE ABSCESS Left 04/17/2019   Procedure: INCISION AND DRAINAGE NECK ABSCESS;  Surgeon: Melissa Montane, MD;  Location: [facility];  Service: ENT;  Laterality: Left;       History reviewed. No pertinent family history.  Social History   Tobacco Use  . Smoking status: Former Smoker    Quit date: 12/12/2017    Years since quitting: 1.6  . Smokeless tobacco: Never Used  Substance Use Topics  . Alcohol use: Yes    Comment: occasional wine  . Drug use: Yes    Types: IV, Cocaine    Comment: heroin    Home Medications Prior to Admission medications   Medication Sig Start Date End Date Taking? Authorizing Provider  amLODipine (NORVASC) 2.5 MG tablet Take 1 tablet (2.5 mg total) by mouth daily. 04/20/19 05/20/19  Antonieta Pert, MD  diclofenac sodium (VOLTAREN) 1 % GEL Apply 4 g topically 4 (four) times daily. Patient not taking: Reported on 01/08/2019 01/05/19   Palumbo, April, MD  ibuprofen (ADVIL) 800 MG tablet Take 1 tablet (800 mg total) by mouth every 8 (eight) hours  as needed for up to 15 doses for moderate pain. 04/20/19   Antonieta Pert, MD  lidocaine (LIDODERM) 5 % Place 1 patch onto the skin daily. Remove & Discard patch within 12 hours or as directed by MD Patient not taking: Reported on 01/08/2019 01/05/19   Palumbo, April, MD    Allergies    Bee venom  Review of Systems   Review of Systems All other systems reviewed and are negative except that which was mentioned in HPI  Physical Exam Updated Vital Signs BP 123/65 (BP Location: Right Arm) Comment: Simultaneous filing. User may not have seen previous data.  Pulse (!) 113 Comment: Simultaneous filing. User may not have seen previous data.  Temp 98.4 F (36.9 C) (Oral)   Resp 16   Ht '5\' 10"'$  (1.778 m)   Wt 77.1 kg   SpO2 98%  Comment: Simultaneous filing. User may not have seen previous data.  BMI 24.39 kg/m   Physical Exam Vitals and nursing note reviewed.  Constitutional:      General: He is not in acute distress.    Appearance: He is well-developed.  HENT:     Head: Normocephalic and atraumatic.  Eyes:     Conjunctiva/sclera: Conjunctivae normal.     Pupils: Pupils are equal, round, and reactive to light.  Cardiovascular:     Rate and Rhythm: Regular rhythm. Tachycardia present.     Pulses: Normal pulses.     Heart sounds: Normal heart sounds. No murmur.  Pulmonary:     Effort: Pulmonary effort is normal.     Breath sounds: Normal breath sounds.  Abdominal:     General: Bowel sounds are normal. There is no distension.     Palpations: Abdomen is soft.     Tenderness: There is no abdominal tenderness.  Musculoskeletal:        General: Swelling and tenderness present.     Cervical back: Neck supple.     Comments: Significant swelling of R knee, held in slight flexion and unable to fully range 2/2 pain; diffuse swelling and tenderness of R lower leg; 2+ DP pulse and normal sensation R foot  Skin:    General: Skin is warm and dry.  Neurological:     Mental Status: He is alert and oriented to person, place, and time.     Comments: Fluent speech  Psychiatric:        Mood and Affect: Mood is anxious.     ED Results / Procedures / Treatments   Labs (all labs ordered are listed, but only abnormal results are displayed) Labs Reviewed  COMPREHENSIVE METABOLIC PANEL - Abnormal; Notable for the following components:      Result Value   Glucose, Bld 132 (*)    Creatinine, Ser 1.25 (*)    Calcium 8.5 (*)    Albumin 2.7 (*)    All other components within normal limits  CBC WITH DIFFERENTIAL/PLATELET - Abnormal; Notable for the following components:   WBC 28.6 (*)    RBC 4.09 (*)    Hemoglobin 11.8 (*)    HCT 36.6 (*)    Neutro Abs 23.7 (*)    Monocytes Absolute 1.5 (*)    Abs Immature  Granulocytes 1.59 (*)    All other components within normal limits  PROTIME-INR - Abnormal; Notable for the following components:   Prothrombin Time 15.5 (*)    All other components within normal limits  SEDIMENTATION RATE - Abnormal; Notable for the following components:   Sed Rate  63 (*)    All other components within normal limits  CULTURE, BLOOD (ROUTINE X 2)  CULTURE, BLOOD (ROUTINE X 2)  CULTURE, BLOOD (SINGLE)  RESPIRATORY PANEL BY RT PCR (FLU A&B, COVID)  LACTIC ACID, PLASMA  LACTIC ACID, PLASMA  C-REACTIVE PROTEIN    EKG None  Radiology US Venous Img Lower Right (DVT Study)  Result Date: 07/22/2019 CLINICAL DATA:  Right leg swelling EXAM: RIGHT LOWER EXTREMITY VENOUS DOPPLER ULTRASOUND TECHNIQUE: Gray-scale sonography with compression, as well as color and duplex ultrasound, were performed to evaluate the deep venous system(s) from the level of the common femoral vein through the popliteal and proximal calf veins. COMPARISON:  None. FINDINGS: VENOUS Normal compressibility of the common femoral, superficial femoral and popliteal veins. The calf veins are limited in visualization secondary to surrounding soft tissue edema. Visualized portions of profunda femoral vein and great saphenous vein unremarkable. No filling defects to suggest DVT on grayscale or color Doppler imaging. Doppler waveforms show normal direction of venous flow, normal respiratory phasicity and response to augmentation. Limited views of the contralateral common femoral vein are unremarkable. OTHER None. Limitations: none IMPRESSION: Limited evaluation of the calf veins, as described above, without evidence of right lower extremity DVT. If clinical symptoms are inconsistent or if there are persistent or worsening symptoms, further imaging (possibly involving the iliac veins) may be warranted. Electronically Signed   By: Virgina Norfolk M.D.   On: 07/22/2019 21:24   DG Knee Complete 4 Views Right  Result Date:  07/22/2019 CLINICAL DATA:  Leg swelling.  IV drug use. EXAM: RIGHT KNEE - COMPLETE 4+ VIEW COMPARISON:  None. FINDINGS: No bony abnormalities. No fracture or effusion. No bony lesions. Soft tissue edema identified. IMPRESSION: Soft tissue edema/swelling.  No other abnormalities. Electronically Signed   By: Dorise Bullion III M.D   On: 07/22/2019 20:47    Procedures .Joint Aspiration/Arthrocentesis  Date/Time: 07/22/2019 10:58 PM Performed by: Sharlett Iles, MD Authorized by: Sharlett Iles, MD   Consent:    Consent obtained:  Verbal   Consent given by:  Patient   Risks discussed:  Bleeding, pain and infection Location:    Location:  Knee   Knee:  R knee Anesthesia (see MAR for exact dosages):    Anesthesia method:  None Procedure details:    Needle gauge:  22 G   Ultrasound guidance: no     Approach:  Lateral   Aspirate amount:  0 Post-procedure details:    Dressing:  Adhesive bandage   Patient tolerance of procedure:  Tolerated well, no immediate complications Comments:     Dry arthrocentesis, no fluid aspirated   (including critical care time) CRITICAL CARE Performed by: Wenda Overland Assata Juncaj   Total critical care time: 35 minutes  Critical care time was exclusive of separately billable procedures and treating other patients.  Critical care was necessary to treat or prevent imminent or life-threatening deterioration.  Critical care was time spent personally by me on the following activities: development of treatment plan with patient and/or surrogate as well as nursing, discussions with consultants, evaluation of patient's response to treatment, examination of patient, obtaining history from patient or surrogate, ordering and performing treatments and interventions, ordering and review of laboratory studies, ordering and review of radiographic studies, pulse oximetry and re-evaluation of patient's condition.  Medications Ordered in ED Medications    pentafluoroprop-tetrafluoroeth (GEBAUERS) aerosol (has no administration in time range)  pentafluoroprop-tetrafluoroeth (GEBAUERS) aerosol (has no administration in time range)  piperacillin-tazobactam (ZOSYN) IVPB  3.375 g (3.375 g Intravenous New Bag/Given 07/22/19 2258)  vancomycin (VANCOCIN) IVPB 1000 mg/200 mL premix (1,000 mg Intravenous New Bag/Given 07/22/19 2256)  0.9 %  sodium chloride infusion (has no administration in time range)  ketamine (KETALAR) injection 23 mg (has no administration in time range)  morphine 4 MG/ML injection 4 mg (4 mg Intravenous Given 07/22/19 2220)  sodium chloride 0.9 % bolus 1,000 mL (1,000 mLs Intravenous New Bag/Given 07/22/19 2254)    ED Course  I have reviewed the triage vital signs and the nursing notes.  Pertinent labs & imaging results that were available during my care of the patient were reviewed by me and considered in my medical decision making (see chart for details).    MDM Rules/Calculators/A&P                     Pt anxious but non-toxic on exam, afebrile. Significant R knee and lower leg swelling. Concern for septic joint vs myositis vs deep space abscess. DDx also includes endocarditis causing septic emboli. Obtained blood cultures x 3. Labs show Cr 1.25, WBC 28.6, Hgb 11.8, INR 1.2, ESR 63. DVT US negative. XR shows soft tissue swelling, no effusion. Dry tap on arthocentesis, making septic joint less likely. Discussed w/ Dr. Lorin Mercy, ortho, who will see pt in consultation. We agreed pt should get admitted for IV abx and MRI to eval for infection. Discussed admission w/ hospitalist, Dr. Myna Hidalgo, who accepted pt for admission. I have ordered MRI.  Final Clinical Impression(s) / ED Diagnoses Final diagnoses:  None    Rx / DC Orders ED Discharge Orders    None       Ethleen Lormand, Wenda Overland, MD 07/22/19 2315

## 2019-07-22 NOTE — ED Notes (Signed)
Pt. Is sleeping, will update vitals when awake.

## 2019-07-23 ENCOUNTER — Inpatient Hospital Stay (HOSPITAL_COMMUNITY): Payer: Self-pay

## 2019-07-23 ENCOUNTER — Inpatient Hospital Stay (HOSPITAL_BASED_OUTPATIENT_CLINIC_OR_DEPARTMENT_OTHER): Payer: Self-pay

## 2019-07-23 DIAGNOSIS — U071 COVID-19: Secondary | ICD-10-CM

## 2019-07-23 DIAGNOSIS — M009 Pyogenic arthritis, unspecified: Secondary | ICD-10-CM

## 2019-07-23 DIAGNOSIS — N179 Acute kidney failure, unspecified: Secondary | ICD-10-CM

## 2019-07-23 DIAGNOSIS — L089 Local infection of the skin and subcutaneous tissue, unspecified: Secondary | ICD-10-CM | POA: Insufficient documentation

## 2019-07-23 DIAGNOSIS — R6520 Severe sepsis without septic shock: Secondary | ICD-10-CM

## 2019-07-23 DIAGNOSIS — A419 Sepsis, unspecified organism: Principal | ICD-10-CM

## 2019-07-23 DIAGNOSIS — F1990 Other psychoactive substance use, unspecified, uncomplicated: Secondary | ICD-10-CM

## 2019-07-23 LAB — CBC WITH DIFFERENTIAL/PLATELET
Abs Immature Granulocytes: 3.05 10*3/uL — ABNORMAL HIGH (ref 0.00–0.07)
Basophils Absolute: 0.1 10*3/uL (ref 0.0–0.1)
Basophils Relative: 0 %
Eosinophils Absolute: 0.3 10*3/uL (ref 0.0–0.5)
Eosinophils Relative: 1 %
HCT: 33.9 % — ABNORMAL LOW (ref 39.0–52.0)
Hemoglobin: 10.8 g/dL — ABNORMAL LOW (ref 13.0–17.0)
Immature Granulocytes: 11 %
Lymphocytes Relative: 5 %
Lymphs Abs: 1.3 10*3/uL (ref 0.7–4.0)
MCH: 28.9 pg (ref 26.0–34.0)
MCHC: 31.9 g/dL (ref 30.0–36.0)
MCV: 90.6 fL (ref 80.0–100.0)
Monocytes Absolute: 2.5 10*3/uL — ABNORMAL HIGH (ref 0.1–1.0)
Monocytes Relative: 9 %
Neutro Abs: 20.2 10*3/uL — ABNORMAL HIGH (ref 1.7–7.7)
Neutrophils Relative %: 74 %
Platelets: 259 10*3/uL (ref 150–400)
RBC: 3.74 MIL/uL — ABNORMAL LOW (ref 4.22–5.81)
RDW: 14.7 % (ref 11.5–15.5)
WBC: 27.3 10*3/uL — ABNORMAL HIGH (ref 4.0–10.5)
nRBC: 0 % (ref 0.0–0.2)

## 2019-07-23 LAB — BASIC METABOLIC PANEL
Anion gap: 11 (ref 5–15)
BUN: 17 mg/dL (ref 6–20)
CO2: 22 mmol/L (ref 22–32)
Calcium: 7.9 mg/dL — ABNORMAL LOW (ref 8.9–10.3)
Chloride: 104 mmol/L (ref 98–111)
Creatinine, Ser: 1.34 mg/dL — ABNORMAL HIGH (ref 0.61–1.24)
GFR calc Af Amer: >60 mL/min (ref >60)
GFR calc non Af Amer: >60 mL/min (ref >60)
Glucose, Bld: 124 mg/dL — ABNORMAL HIGH (ref 70–99)
Potassium: 3.9 mmol/L (ref 3.5–5.1)
Sodium: 137 mmol/L (ref 135–145)

## 2019-07-23 LAB — RESPIRATORY PANEL BY RT PCR (FLU A&B, COVID)
Influenza A by PCR: NEGATIVE
Influenza B by PCR: NEGATIVE
SARS Coronavirus 2 by RT PCR: POSITIVE — AB

## 2019-07-23 LAB — LACTIC ACID, PLASMA: Lactic Acid, Venous: 2.5 mmol/L (ref 0.5–1.9)

## 2019-07-23 LAB — SEDIMENTATION RATE: Sed Rate: 65 mm/hr — ABNORMAL HIGH (ref 0–16)

## 2019-07-23 LAB — C-REACTIVE PROTEIN: CRP: 28 mg/dL — ABNORMAL HIGH (ref <1.0)

## 2019-07-23 MED ORDER — ACETAMINOPHEN 650 MG RE SUPP: 650.0000 mg | Freq: Four times a day (QID) | RECTAL | Status: DC | PRN

## 2019-07-23 MED ORDER — DICYCLOMINE HCL 10 MG PO CAPS
10.0000 mg | ORAL_CAPSULE | Freq: Three times a day (TID) | ORAL | Status: DC
  Administered 2019-07-23 – 2019-07-26 (×12): 10 mg via ORAL
  Filled 2019-07-23 (×12): qty 1

## 2019-07-23 MED ORDER — SODIUM CHLORIDE 0.9 % IV BOLUS
1000.0000 mL | Freq: Once | INTRAVENOUS | Status: AC
  Administered 2019-07-23: 1000 mL via INTRAVENOUS

## 2019-07-23 MED ORDER — SODIUM CHLORIDE 0.9 % IV BOLUS
250.0000 mL | Freq: Once | INTRAVENOUS | Status: AC
  Administered 2019-07-23: 01:00:00 250 mL via INTRAVENOUS

## 2019-07-23 MED ORDER — KETOROLAC TROMETHAMINE 30 MG/ML IJ SOLN
30.0000 mg | Freq: Four times a day (QID) | INTRAMUSCULAR | Status: DC | PRN
  Administered 2019-07-23 – 2019-07-26 (×7): 30 mg via INTRAVENOUS
  Filled 2019-07-23 (×7): qty 1

## 2019-07-23 MED ORDER — VANCOMYCIN HCL IN DEXTROSE 1-5 GM/200ML-% IV SOLN
1000.0000 mg | Freq: Two times a day (BID) | INTRAVENOUS | Status: DC
  Administered 2019-07-23 – 2019-07-26 (×6): 1000 mg via INTRAVENOUS
  Filled 2019-07-23 (×6): qty 200

## 2019-07-23 MED ORDER — LORAZEPAM 1 MG PO TABS
1.0000 mg | ORAL_TABLET | ORAL | Status: DC | PRN
  Filled 2019-07-23 (×5): qty 1

## 2019-07-23 MED ORDER — SODIUM CHLORIDE 0.9% FLUSH
3.0000 mL | Freq: Two times a day (BID) | INTRAVENOUS | Status: DC
  Administered 2019-07-24 – 2019-07-25 (×3): 3 mL via INTRAVENOUS

## 2019-07-23 MED ORDER — ACETAMINOPHEN 500 MG PO TABS
1000.0000 mg | ORAL_TABLET | Freq: Once | ORAL | Status: AC
  Administered 2019-07-23: 1000 mg via ORAL
  Filled 2019-07-23: qty 2

## 2019-07-23 MED ORDER — ACETAMINOPHEN 325 MG PO TABS
650.0000 mg | ORAL_TABLET | Freq: Four times a day (QID) | ORAL | Status: DC | PRN
  Administered 2019-07-23: 650 mg via ORAL
  Filled 2019-07-23: qty 2

## 2019-07-23 MED ORDER — ZOLPIDEM TARTRATE 5 MG PO TABS
5.0000 mg | ORAL_TABLET | Freq: Every evening | ORAL | Status: DC | PRN
  Administered 2019-07-23: 5 mg via ORAL
  Filled 2019-07-23: qty 1

## 2019-07-23 MED ORDER — LORAZEPAM 2 MG/ML IJ SOLN: 1.0000 mg | INTRAMUSCULAR | Status: DC | PRN

## 2019-07-23 MED ORDER — SODIUM CHLORIDE 0.9 % IV SOLN: INTRAVENOUS | Status: DC

## 2019-07-23 MED ORDER — LORAZEPAM 1 MG PO TABS
1.0000 mg | ORAL_TABLET | ORAL | Status: DC | PRN
  Administered 2019-07-23 – 2019-07-26 (×6): 1 mg via ORAL
  Filled 2019-07-23: qty 1

## 2019-07-23 MED ORDER — ONDANSETRON HCL 4 MG/2ML IJ SOLN: 4.0000 mg | Freq: Four times a day (QID) | INTRAMUSCULAR | Status: DC | PRN

## 2019-07-23 MED ORDER — LOPERAMIDE HCL 2 MG PO CAPS
2.0000 mg | ORAL_CAPSULE | ORAL | Status: DC | PRN
  Administered 2019-07-26: 08:00:00 2 mg via ORAL
  Filled 2019-07-23: qty 1

## 2019-07-23 MED ORDER — VANCOMYCIN HCL 750 MG/150ML IV SOLN
750.0000 mg | Freq: Three times a day (TID) | INTRAVENOUS | Status: DC
  Administered 2019-07-23: 750 mg via INTRAVENOUS
  Filled 2019-07-23 (×3): qty 150

## 2019-07-23 MED ORDER — ONDANSETRON HCL 4 MG PO TABS: 4.0000 mg | ORAL_TABLET | Freq: Four times a day (QID) | ORAL | Status: DC | PRN

## 2019-07-23 NOTE — ED Notes (Signed)
Report given to Bradley at [facility].  Still waiting for transport.

## 2019-07-23 NOTE — Consult Note (Signed)
Reason for Consult:right LE infection , IV drug abuse Referring Physician: Pokhrel MD  Grant Tapia is an 34 y.o. male.  HPI: 34 YO with history of IV drug abuse presents ER with atraumatic pain cellulitis right lateral knee distal thigh and proximal leg.  Patient is an IV heroin abuser also presented with Covid positive.  Doppler test negative for DVT.  He was started on double antibiotics.  MRI scan has been obtained and reviewed which shows soft tissue swelling but no definite abscess.  Past Medical History:  Diagnosis Date  . Heroin abuse (HCC)   . Hypertension   . IV drug user   . Neck abscess 04/2019  . Overdose   . Polysubstance abuse [facility])     Past Surgical History:  Procedure Laterality Date  . I & D EXTREMITY Left 11/19/2016   Procedure: INCISION AND DRAINAGE LEFT ELBOW  ABSCESS;  Surgeon: Myrene Galas, MD;  Location: [facility] OR;  Service: Orthopedics;  Laterality: Left;  . INCISION AND DRAINAGE ABSCESS Left 04/17/2019   Procedure: INCISION AND DRAINAGE NECK ABSCESS;  Surgeon: [name], MD;  Location: [facility] OR;  Service: ENT;  Laterality: Left;    History reviewed. No pertinent family history.  Social History:  reports that he quit smoking about 19 months ago. He has never used smokeless tobacco. He reports current alcohol use. He reports current drug use. Drugs: IV and Cocaine.  Allergies:  Allergies  Allergen Reactions  . Bee Venom Swelling    Medications: I have reviewed the patient's current medications.  Results for orders placed or performed during the hospital encounter of 07/22/19 (from the past 48 hour(s))  Comprehensive metabolic panel     Status: Abnormal   Collection Time: 07/22/19  9:09 PM  Result Value Ref Range   Sodium 135 135 - 145 mmol/L   Potassium 3.6 3.5 - 5.1 mmol/L   Chloride 101 98 - 111 mmol/L   CO2 24 22 - 32 mmol/L   Glucose, Bld 132 (H) 70 - 99 mg/dL    Comment: Glucose reference range applies only to samples taken after  fasting for at least 8 hours.   BUN 16 6 - 20 mg/dL   Creatinine, Ser 0.99 (H) 0.61 - 1.24 mg/dL   Calcium 8.5 (L) 8.9 - 10.3 mg/dL   Total Protein 7.2 6.5 - 8.1 g/dL   Albumin 2.7 (L) 3.5 - 5.0 g/dL   AST 33 15 - 41 U/L   ALT 32 0 - 44 U/L   Alkaline Phosphatase 76 38 - 126 U/L   Total Bilirubin 0.7 0.3 - 1.2 mg/dL   GFR calc non Af Amer >60 >60 mL/min   GFR calc Af Amer >60 >60 mL/min   Anion gap 10 5 - 15    Comment: Performed at [facility], [address] [place], [place] [address]  CBC with Differential     Status: Abnormal   Collection Time: 07/22/19  9:09 PM  Result Value Ref Range   WBC 28.6 (H) 4.0 - 10.5 K/uL   RBC 4.09 (L) 4.22 - 5.81 MIL/uL   Hemoglobin 11.8 (L) 13.0 - 17.0 g/dL   HCT 50.5 (L) 39.7 - 67.3 %   MCV 89.5 80.0 - 100.0 fL   MCH 28.9 26.0 - 34.0 pg   MCHC 32.2 30.0 - 36.0 g/dL   RDW 41.9 37.9 - 02.4 %   Platelets 286 150 - 400 K/uL   nRBC 0.0 0.0 - 0.2 %  Neutrophils Relative % 83 %   Neutro Abs 23.7 (H) 1.7 - 7.7 K/uL   Lymphocytes Relative 5 %   Lymphs Abs 1.4 0.7 - 4.0 K/uL   Monocytes Relative 5 %   Monocytes Absolute 1.5 (H) 0.1 - 1.0 K/uL   Eosinophils Relative 1 %   Eosinophils Absolute 0.3 0.0 - 0.5 K/uL   Basophils Relative 0 %   Basophils Absolute 0.1 0.0 - 0.1 K/uL   WBC Morphology DOHLE BODIES     Comment: INCREASED BANDS (>20% BANDS) TOXIC GRANULATION VACUOLATED NEUTROPHILS    RBC Morphology MORPHOLOGY UNREMARKABLE    Smear Review Normal platelet morphology    Immature Granulocytes 6 %   Abs Immature Granulocytes 1.59 (H) 0.00 - 0.07 K/uL   Dohle Bodies PRESENT     Comment: Performed at [facility], [address] [place], [place] [address]  Lactic acid, plasma     Status: Abnormal   Collection Time: 07/22/19  9:09 PM  Result Value Ref Range   Lactic Acid, Venous 2.2 (HH) 0.5 - 1.9 mmol/L    Comment: CRITICAL RESULT CALLED TO, READ BACK BY AND VERIFIED WITH: MAYNARD,C AT 2316 ON [id number] BY  CHERESNOWSKY,T Performed at [facility], [address] [place], [place] [address]   Protime-INR     Status: Abnormal   Collection Time: 07/22/19  9:09 PM  Result Value Ref Range   Prothrombin Time 15.5 (H) 11.4 - 15.2 seconds   INR 1.2 0.8 - 1.2    Comment: (NOTE) INR goal varies based on device and disease states. Performed at [facility], [address] [place], [place] [address]   Sedimentation rate     Status: Abnormal   Collection Time: 07/22/19  9:09 PM  Result Value Ref Range   Sed Rate 63 (H) 0 - 16 mm/hr    Comment: Performed at [facility], [address] [place], [place] [address]  C-reactive protein     Status: Abnormal   Collection Time: 07/22/19  9:09 PM  Result Value Ref Range   CRP 32.5 (H) <1.0 mg/dL    Comment: Performed at [facility], [address] [place], [place] [address]  Respiratory Panel by RT PCR (Flu A&B, Covid) - Nasopharyngeal Swab     Status: Abnormal   Collection Time: 07/22/19  9:09 PM   Specimen: Nasopharyngeal Swab  Result Value Ref Range   SARS Coronavirus 2 by RT PCR POSITIVE (A) NEGATIVE    Comment: RESULT CALLED TO, READ BACK BY AND VERIFIED WITH: MAYNARD,C AT 0022 ON [id number] BY CHERESNOWSKY,T (NOTE) SARS-CoV-2 target nucleic acids are DETECTED. SARS-CoV-2 RNA is generally detectable in upper respiratory specimens  during the acute phase of infection. Positive results are indicative of the presence of the identified virus, but do not rule out bacterial infection or co-infection with other pathogens not detected by the test. Clinical correlation with patient history and other diagnostic information is necessary to determine patient infection status. The expected result is Negative. Fact Sheet for Patients:  https://www.moore.com/ Fact Sheet for Healthcare Providers: https://www.young.biz/ This test is not yet approved or cleared by the [place]  FDA and  has been authorized for detection and/or diagnosis of SARS-CoV-2 by FDA under an Emergency Use Authorization (EUA).  This EUA will remain in effect (meaning this test can  be used) for the duration of  the COVID-19 declaration under Section 564(b)(1) of the Act, 21 U.S.C.  section 360bbb-3(b)(1), unless the authorization is terminated or revoked sooner.    Influenza A by PCR NEGATIVE NEGATIVE   Influenza B by PCR NEGATIVE NEGATIVE    Comment: (NOTE) The Xpert Xpress SARS-CoV-2/FLU/RSV assay is intended as an aid in  the diagnosis of influenza from Nasopharyngeal swab specimens and  should not be used as a sole basis for treatment. Nasal washings and  aspirates are unacceptable for Xpert Xpress SARS-CoV-2/FLU/RSV  testing. Fact Sheet for Patients: PinkCheek.be Fact Sheet for Healthcare Providers: GravelBags.it This test is not yet approved or cleared by the [place] FDA and  has been authorized for detection and/or diagnosis of SARS-CoV-2 by  FDA under an Emergency Use Authorization (EUA). This EUA will remain  in effect (meaning this test can be used) for the duration of the  Covid-19 declaration under Section 564(b)(1) of the Act, 21  U.S.C. section 360bbb-3(b)(1), unless the authorization is  terminated or revoked. Performed at [facility], [address] [place], [place] [address]   Culture, blood (routine x 2)     Status: None (Preliminary result)   Collection Time: 07/22/19  9:15 PM   Specimen: BLOOD LEFT ARM  Result Value Ref Range   Specimen Description      BLOOD LEFT ARM Performed at [facility], [address] [place], [place] [address]    Special Requests      BOTTLES DRAWN AEROBIC AND ANAEROBIC Blood Culture adequate volume Performed at [facility], [address] [place], [place] [address]    Culture      NO GROWTH < 12 HOURS Performed at [facility], [address], [place], [place] [address]    Report Status PENDING   Culture, blood (routine x 2)     Status: None (Preliminary result)   Collection Time: 07/22/19  9:25 PM   Specimen: BLOOD RIGHT ARM  Result Value Ref Range   Specimen Description      BLOOD RIGHT ARM Performed at [facility], [address] [place], [place] [address]    Special Requests      BOTTLES DRAWN AEROBIC AND ANAEROBIC Blood Culture results may not be optimal due to an inadequate volume of blood received in culture bottles Performed at [facility], [address] [place], [place] [address]    Culture      NO GROWTH < 12 HOURS Performed at [facility], [place] [address], [place], [place] [address]    Report Status PENDING   Culture, blood (single)     Status: None (Preliminary result)   Collection Time: 07/22/19  9:30 PM   Specimen: BLOOD RIGHT HAND  Result Value Ref Range   Specimen Description      BLOOD RIGHT HAND Performed at [facility], [address] [place], [place] [address]    Special Requests      BOTTLES DRAWN AEROBIC AND ANAEROBIC Blood Culture adequate volume Performed at [facility], [address] [place], [place] [address]    Culture      NO GROWTH < 12 HOURS Performed at [facility], [place] [address], [place], [place] [address]    Report Status PENDING   Lactic acid, plasma     Status: Abnormal   Collection Time: 07/22/19 11:50 PM  Result Value Ref Range   Lactic Acid, Venous 2.5 (HH) 0.5 - 1.9 mmol/L  Comment: CRITICAL RESULT CALLED TO, READ BACK BY AND VERIFIED WITH: MAYNARD,C AT 0014 ON [id number] BY CHERESNOWSKY,T Performed at [facility], [address] [place], [place] [address]   Basic metabolic panel     Status: Abnormal   Collection Time: 07/23/19  5:05 AM  Result Value Ref Range   Sodium 137 135 - 145 mmol/L   Potassium 3.9 3.5 - 5.1 mmol/L   Chloride 104 98 - 111 mmol/L   CO2 22 22  - 32 mmol/L   Glucose, Bld 124 (H) 70 - 99 mg/dL    Comment: Glucose reference range applies only to samples taken after fasting for at least 8 hours.   BUN 17 6 - 20 mg/dL   Creatinine, Ser 8.89 (H) 0.61 - 1.24 mg/dL   Calcium 7.9 (L) 8.9 - 10.3 mg/dL   GFR calc non Af Amer >60 >60 mL/min   GFR calc Af Amer >60 >60 mL/min   Anion gap 11 5 - 15    Comment: Performed at [facility], [address], [place], [place] [address]  CBC WITH DIFFERENTIAL     Status: Abnormal   Collection Time: 07/23/19  5:05 AM  Result Value Ref Range   WBC 27.3 (H) 4.0 - 10.5 K/uL   RBC 3.74 (L) 4.22 - 5.81 MIL/uL   Hemoglobin 10.8 (L) 13.0 - 17.0 g/dL   HCT 03.8 (L) 88.2 - 80.0 %   MCV 90.6 80.0 - 100.0 fL   MCH 28.9 26.0 - 34.0 pg   MCHC 31.9 30.0 - 36.0 g/dL   RDW 34.9 17.9 - 15.0 %   Platelets 259 150 - 400 K/uL   nRBC 0.0 0.0 - 0.2 %   Neutrophils Relative % 74 %   Neutro Abs 20.2 (H) 1.7 - 7.7 K/uL   Lymphocytes Relative 5 %   Lymphs Abs 1.3 0.7 - 4.0 K/uL   Monocytes Relative 9 %   Monocytes Absolute 2.5 (H) 0.1 - 1.0 K/uL   Eosinophils Relative 1 %   Eosinophils Absolute 0.3 0.0 - 0.5 K/uL   Basophils Relative 0 %   Basophils Absolute 0.1 0.0 - 0.1 K/uL   WBC Morphology DOHLE BODIES     Comment: TOXIC GRANULATION VACUOLATED NEUTROPHILS    Immature Granulocytes 11 %   Abs Immature Granulocytes 3.05 (H) 0.00 - 0.07 K/uL   Burr Cells PRESENT     Comment: Performed at [facility], [address] [place], [place] [address]  C-reactive protein     Status: Abnormal   Collection Time: 07/23/19  5:05 AM  Result Value Ref Range   CRP 28.0 (H) <1.0 mg/dL    Comment: Performed at [facility], [address] [place], [place] [address]  Sedimentation rate     Status: Abnormal   Collection Time: 07/23/19  5:05 AM  Result Value Ref Range   Sed Rate 65 (H) 0 - 16 mm/hr    Comment: Performed at [facility], [address] [place], [place] [address]    MR TIBIA FIBULA RIGHT WO CONTRAST  Result Date: 07/23/2019 CLINICAL DATA:  Right knee and lower leg swelling. History of IV drug abuse and COVID-19 infection. Clinical concern for infection. EXAM: MRI OF LOWER RIGHT EXTREMITY WITHOUT CONTRAST TECHNIQUE: Multiplanar, multisequence MR imaging of the right lower leg was performed. No intravenous contrast was administered. COMPARISON:  Radiographs 07/22/2019 and 01/08/2019. FINDINGS: Bones/Joint/Cartilage Both lower legs are included on the  coronal images. Examination extends from just above the knee through the distal lower leg. The ankle is not included. There is no evidence of acute fracture, dislocation or bone destruction. The right tibia and fibula appear normal. No significant knee joint effusion or arthropathy. Ligaments Not relevant for exam/indication. The cruciate ligaments appear intact at the knee. Muscles and Tendons No focal intramuscular fluid collection or significant muscular edema. The patellar tendon and visualized Achilles tendon appear normal. Soft tissues There is extensive subcutaneous edema and ill-defined fluid medially, extending from just above the knee into the distal lower leg. Ill-defined fluid tracks between the medial head of the gastrocnemius muscle and the soleus muscle, measuring up to 6 mm in thickness. No focal fluid collection identified on noncontrast imaging. No evidence of foreign body or soft tissue emphysema. No gross vascular abnormality. IMPRESSION: 1. Extensive subcutaneous edema and ill-defined fluid medially in the right lower leg, extending from just above the knee into the distal lower leg. Findings are consistent with cellulitis and possible fasciitis. No focal fluid collection or intramuscular abnormality identified. 2. No evidence of osteomyelitis or septic joint at the right knee. Electronically Signed   By: Carey Bullocks M.D.   On: 07/23/2019 11:11   US Venous Img  Lower Right (DVT Study)  Result Date: 07/22/2019 CLINICAL DATA:  Right leg swelling EXAM: RIGHT LOWER EXTREMITY VENOUS DOPPLER ULTRASOUND TECHNIQUE: Gray-scale sonography with compression, as well as color and duplex ultrasound, were performed to evaluate the deep venous system(s) from the level of the common femoral vein through the popliteal and proximal calf veins. COMPARISON:  None. FINDINGS: VENOUS Normal compressibility of the common femoral, superficial femoral and popliteal veins. The calf veins are limited in visualization secondary to surrounding soft tissue edema. Visualized portions of profunda femoral vein and great saphenous vein unremarkable. No filling defects to suggest DVT on grayscale or color Doppler imaging. Doppler waveforms show normal direction of venous flow, normal respiratory phasicity and response to augmentation. Limited views of the contralateral common femoral vein are unremarkable. OTHER None. Limitations: none IMPRESSION: Limited evaluation of the calf veins, as described above, without evidence of right lower extremity DVT. If clinical symptoms are inconsistent or if there are persistent or worsening symptoms, further imaging (possibly involving the iliac veins) may be warranted. Electronically Signed   By: Aram Candela M.D.   On: 07/22/2019 21:24   DG CHEST PORT 1 VIEW  Result Date: 07/23/2019 CLINICAL DATA:  Right knee infection, COVID positive EXAM: PORTABLE CHEST 1 VIEW COMPARISON:  01/05/2019 FINDINGS: The heart size and mediastinal contours are within normal limits. Both lungs are clear. The visualized skeletal structures are unremarkable. IMPRESSION: No active disease. Electronically Signed   By: Jasmine Pang M.D.   On: 07/23/2019 01:15   DG Knee Complete 4 Views Right  Result Date: 07/22/2019 CLINICAL DATA:  Leg swelling.  IV drug use. EXAM: RIGHT KNEE - COMPLETE 4+ VIEW COMPARISON:  None. FINDINGS: No bony abnormalities. No fracture or effusion. No bony  lesions. Soft tissue edema identified. IMPRESSION: Soft tissue edema/swelling.  No other abnormalities. Electronically Signed   By: Gerome Sam III M.D   On: 07/22/2019 20:47    ROS positive for fever, malaise, IV heroin use, hypertension.  Otherwise negative as pertains HPI. Blood pressure 110/63, pulse 100, temperature 99 F (37.2 C), temperature source Oral, resp. rate 19, height  (1.778 m), weight 77.1 kg, SpO2 97 %. Physical Exam  Constitutional: He is oriented to person, place, and time.  He appears well-developed.  HENT:  Head: Normocephalic.  Eyes: Pupils are equal, round, and reactive to light.  Neck: No tracheal deviation present. No thyromegaly present.  Cardiovascular: Normal rate.  Respiratory: Effort normal. He has no wheezes.  GI: Soft.  Musculoskeletal:     Cervical back: Normal range of motion.     Comments: Tenderness and increased warmth lateral knee , distal thigh and proximal lateral leg. Pulses,motor normal. Compartments soft.   Neurological: He is alert and oriented to person, place, and time.  Skin: There is erythema.  Psychiatric: He has a normal mood and affect.    Assessment/Plan: Covid positive heroin user with cellulitis, soft tissue edema swelling lateral knee region with dry tap of the knee performed in the ER.  IV antibiotics appropriate no definite abscess.  No evidence of compartment syndrome.  Will sign off, call if I am needed. Diet ordered for patient.   Eldred MangesMark C Damier Disano 07/23/2019, 1:10 PM

## 2019-07-23 NOTE — ED Notes (Signed)
Leaving with carelink at this time. 

## 2019-07-23 NOTE — Progress Notes (Signed)
Pt refused any additional imaging. MRI without being completed out. Pt unable to lay on table any longer.

## 2019-07-23 NOTE — Plan of Care (Signed)
  Problem: Education: Goal: Knowledge of General Education information will improve Description Including pain rating scale, medication(s)/side effects and non-pharmacologic comfort measures Outcome: Progressing   Problem: Nutrition: Goal: Adequate nutrition will be maintained Outcome: Progressing   Problem: Pain Managment: Goal: General experience of comfort will improve Outcome: Progressing   

## 2019-07-23 NOTE — Progress Notes (Signed)
Pharmacy Antibiotic Note  Grant Tapia is a 34 y.o. male admitted on 07/22/2019 with cellulitis.  Pharmacy has been consulted for Vancomycin dosing. Pt with possible septic knee secondary to IV drug use. WBC is markedly elevated. Renal function age appropriate. Lactic acid elevated.   Plan: Vancomycin 750 mg IV q8h >>Estimated AUC: 522 Zosyn x 1 in the ED, f/u additional gram negative coverage  Trend WBC, temp, renal function  F/U infectious work-up Drug levels as indicated   Height: 5\' 10"  (177.8 cm) Weight: 77.1 kg (170 lb) IBW/kg (Calculated) : 73  Temp (24hrs), Avg:99.2 F (37.3 C), Min:98.4 F (36.9 C), Max:100 F (37.8 C)  Recent Labs  Lab 07/22/19 2109 07/22/19 2350  WBC 28.6*  --   CREATININE 1.25*  --   LATICACIDVEN 2.2* 2.5*    Estimated Creatinine Clearance: 88.4 mL/min (A) (by C-G formula based on SCr of 1.25 mg/dL (H)).    Allergies  Allergen Reactions  . Bee Venom Swelling    09/21/19, PharmD, BCPS Clinical Pharmacist Phone: [phone]

## 2019-07-23 NOTE — ED Notes (Signed)
Dr. Blinda Leatherwood aware of lactic of 2.5.

## 2019-07-23 NOTE — Progress Notes (Signed)
Pharmacy Antibiotic Note  Grant Tapia is a 34 y.o. male admitted on 07/22/2019 with cellulitis.  Pharmacy has been consulted for Vancomycin dosing. Pt with possible septic knee secondary to IV drug use.  WBC is markedly elevated. Low grade fever. Lactic acid elevated.  AKI- Scr elevated & trending up (Baseline Scr 0.7)  Plan: Decrease Vancomycin 1gm IV q12h to target AUC 400-550 Monitor renal function and cx data    Height: 5\' 10"  (177.8 cm) Weight: 77.1 kg (170 lb) IBW/kg (Calculated) : 73  Temp (24hrs), Avg:99.1 F (37.3 C), Min:98.4 F (36.9 C), Max:100 F (37.8 C)  Recent Labs  Lab 07/22/19 2109 07/22/19 2350 07/23/19 0505  WBC 28.6*  --  27.3*  CREATININE 1.25*  --  1.34*  LATICACIDVEN 2.2* 2.5*  --     Estimated Creatinine Clearance: 82.5 mL/min (A) (by C-G formula based on SCr of 1.34 mg/dL (H)).    Allergies  Allergen Reactions  . Bee Venom Swelling    09/22/19, PharmD, BCPS Clinical Pharmacist Phone: [phone]

## 2019-07-23 NOTE — Progress Notes (Signed)
Same day note  Patient seen and examined at bedside.  Patient was admitted to the hospital for right knee swelling inflammation.  At the time of my evaluation, patient complains of knee pain, feeling hungry Physical examination reveals mild erythematous tender right knee with restricted range of movement.  Laboratory data and imaging was reviewed  Assessment and Plan.  Right knee possible septic arthritis bursitis, right leg cellulitis..  Awaiting for MRI of the knee.  Orthopedics on board.  Continue broad-spectrum antibiotic.  X-ray without acute findings.  Unable to aspirate.  On vancomycin and Zosyn.  N.p.o. as per surgery.  Acute kidney injury.  Creatinine 1.5 on presentation.  Received IV fluids in the ED.  Creatinine of 1.34 at this time.  We will closely need to monitor on vancomycin.  IV drug abuse.  Heroin abuse.  Closely watch for withdrawal.  Add Bentyl loperamide Ativan.  Asymptomatic of Covid 19 infection.  Closely monitor for dyspnea.  No Charge  Signed,  Tenny Craw, MD [facility]

## 2019-07-23 NOTE — H&P (Signed)
History and Physical    Grant Tapia NUU:[id number] DOB: January 24, 1986 DOA: 07/22/2019  PCP: Patient, No Pcp Per   Patient coming from: Home   Chief Complaint: Right knee swelling and pain   HPI: Grant Tapia is a 34 y.o. male with medical history significant for IV drug abuse, now presenting to the emergency department with atraumatic pain, swelling, and redness involving the right knee.  Patient reports he been in his usual state of health, continues to use IV heroin, and then developed right knee pain on 07/21/2019 without any trauma or injury.  He woke the morning of 07/22/2019 with marked swelling and redness of the right knee and increased pain.  He denied any cough, fevers, chills, shortness of breath, or other joint aches or rash.  [facility] ED Course: Upon arrival to the ED, patient is found to have a temperature of 100 F, tachycardia, and stable blood pressure.  Chemistry panel was notable for creatinine 1.25, up from an apparent baseline of 0.7.  CBC with leukocytosis to 28,600.  CRP is elevated to 32.5 and lactic acid is elevated to 2.2.  Radiographs of the knee were notable for soft tissue edema without effusion or acute bony abnormality.  Lower extremity venous Doppler was a limited study but no DVT was identified.  Joint aspiration was attempted by the ED physician but no fluid could be obtained.  Blood cultures were collected, IV fluids administered, and the patient was treated with vancomycin and Zosyn.  Orthopedic surgery was consulted by the ED physician and recommended medical admission with IV antibiotics and MRI.  COVID-19 PCR screening test resulted positive.  Review of Systems:  All other systems reviewed and apart from HPI, are negative.  Past Medical History:  Diagnosis Date  . Heroin abuse (HCC)   . Hypertension   . IV drug user   . Neck abscess 04/2019  . Overdose   . Polysubstance abuse [facility])     Past Surgical History:  Procedure  Laterality Date  . I & D EXTREMITY Left 11/19/2016   Procedure: INCISION AND DRAINAGE LEFT ELBOW  ABSCESS;  Surgeon: Myrene Galas, MD;  Location: [facility] OR;  Service: Orthopedics;  Laterality: Left;  . INCISION AND DRAINAGE ABSCESS Left 04/17/2019   Procedure: INCISION AND DRAINAGE NECK ABSCESS;  Surgeon: [name], MD;  Location: [facility] OR;  Service: ENT;  Laterality: Left;     reports that he quit smoking about 19 months ago. He has never used smokeless tobacco. He reports current alcohol use. He reports current drug use. Drugs: IV and Cocaine.  Allergies  Allergen Reactions  . Bee Venom Swelling    History reviewed. No pertinent family history.   Prior to Admission medications   Medication Sig Start Date End Date Taking? Authorizing Provider  ibuprofen (ADVIL) 200 MG tablet Take 800-1,000 mg by mouth every 6 (six) hours as needed for fever, headache, mild pain or moderate pain.   Yes [provider]  amLODipine (NORVASC) 2.5 MG tablet Take 1 tablet (2.5 mg total) by mouth daily. 04/20/19 05/20/19  Lanae Boast, MD  diclofenac sodium (VOLTAREN) 1 % GEL Apply 4 g topically 4 (four) times daily. Patient not taking: Reported on 01/08/2019 01/05/19   Palumbo, April, MD  ibuprofen (ADVIL) 800 MG tablet Take 1 tablet (800 mg total) by mouth every 8 (eight) hours as needed for up to 15 doses for moderate pain. Patient not taking: Reported on 07/23/2019 04/20/19   Lanae Boast, MD  lidocaine (LIDODERM) 5 % Place 1 patch onto the skin daily. Remove & Discard patch within 12 hours or as directed by MD Patient not taking: Reported on 01/08/2019 01/05/19   Randal Buba, April, MD    Physical Exam: Vitals:   07/23/19 0214 07/23/19 0216 07/23/19 0230 07/23/19 0300  BP:  110/67  118/71  Pulse:   (!) 115 (!) 105  Resp:   20 19  Temp: 99.1 F (37.3 C)   99.1 F (37.3 C)  TempSrc: Oral   Oral  SpO2:   97% 98%  Weight:      Height:        Constitutional: NAD, calm  Eyes: PERTLA, lids and conjunctivae  normal ENMT: Mucous membranes are moist. Posterior pharynx clear of any exudate or lesions.   Neck: normal, supple, no masses, no thyromegaly Respiratory:  no wheezing, no crackles. No accessory muscle use.  Cardiovascular: S1 & S2 heard, regular rate and rhythm. No extremity edema.   Abdomen: No distension, no tenderness, soft. Bowel sounds active.  Musculoskeletal: no clubbing / cyanosis. Right knee warm, tender, edematous, and erythematous.   Skin: no significant rashes, lesions, ulcers. Warm, dry, well-perfused. Neurologic: CN 2-12 grossly intact. Sensation intact. Moving all extremities.  Psychiatric: Alert and oriented x 3. Calm and cooperative.    Labs and Imaging on Admission: I have personally reviewed following labs and imaging studies  CBC: Recent Labs  Lab 07/22/19 2109  WBC 28.6*  NEUTROABS 23.7*  HGB 11.8*  HCT 36.6*  MCV 89.5  PLT 527   Basic Metabolic Panel: Recent Labs  Lab 07/22/19 2109  NA 135  K 3.6  CL 101  CO2 24  GLUCOSE 132*  BUN 16  CREATININE 1.25*  CALCIUM 8.5*   GFR: Estimated Creatinine Clearance: 88.4 mL/min (A) (by C-G formula based on SCr of 1.25 mg/dL (H)). Liver Function Tests: Recent Labs  Lab 07/22/19 2109  AST 33  ALT 32  ALKPHOS 76  BILITOT 0.7  PROT 7.2  ALBUMIN 2.7*   No results for input(s): LIPASE, AMYLASE in the last 168 hours. No results for input(s): AMMONIA in the last 168 hours. Coagulation Profile: Recent Labs  Lab 07/22/19 2109  INR 1.2   Cardiac Enzymes: No results for input(s): CKTOTAL, CKMB, CKMBINDEX, TROPONINI in the last 168 hours. BNP (last 3 results) No results for input(s): PROBNP in the last 8760 hours. HbA1C: No results for input(s): HGBA1C in the last 72 hours. CBG: No results for input(s): GLUCAP in the last 168 hours. Lipid Profile: No results for input(s): CHOL, HDL, LDLCALC, TRIG, CHOLHDL, LDLDIRECT in the last 72 hours. Thyroid Function Tests: No results for input(s): TSH, T4TOTAL,  FREET4, T3FREE, THYROIDAB in the last 72 hours. Anemia Panel: No results for input(s): VITAMINB12, FOLATE, FERRITIN, TIBC, IRON, RETICCTPCT in the last 72 hours. Urine analysis:    Component Value Date/Time   COLORURINE YELLOW 12/30/2016 0215   APPEARANCEUR CLEAR 12/30/2016 0215   LABSPEC 1.021 12/30/2016 0215   PHURINE 5.0 12/30/2016 0215   GLUCOSEU NEGATIVE 12/30/2016 0215   HGBUR NEGATIVE 12/30/2016 0215   BILIRUBINUR NEGATIVE 12/30/2016 0215   KETONESUR NEGATIVE 12/30/2016 0215   PROTEINUR NEGATIVE 12/30/2016 0215   UROBILINOGEN 0.2 03/26/2014 2255   NITRITE NEGATIVE 12/30/2016 0215   LEUKOCYTESUR NEGATIVE 12/30/2016 0215   Sepsis Labs: @LABRCNTIP (procalcitonin:4,lacticidven:4) ) Recent Results (from the past 240 hour(s))  Respiratory Panel by RT PCR (Flu A&B, Covid) - Nasopharyngeal Swab     Status: Abnormal   Collection Time: 07/22/19  9:09 PM   Specimen: Nasopharyngeal Swab  Result Value Ref Range Status   SARS Coronavirus 2 by RT PCR POSITIVE (A) NEGATIVE Final    Comment: RESULT CALLED TO, READ BACK BY AND VERIFIED WITH: MAYNARD,C AT 0022 ON [id number] BY CHERESNOWSKY,T (NOTE) SARS-CoV-2 target nucleic acids are DETECTED. SARS-CoV-2 RNA is generally detectable in upper respiratory specimens  during the acute phase of infection. Positive results are indicative of the presence of the identified virus, but do not rule out bacterial infection or co-infection with other pathogens not detected by the test. Clinical correlation with patient history and other diagnostic information is necessary to determine patient infection status. The expected result is Negative. Fact Sheet for Patients:  https://www.moore.com/ Fact Sheet for Healthcare Providers: https://www.young.biz/ This test is not yet approved or cleared by the [place] FDA and  has been authorized for detection and/or diagnosis of SARS-CoV-2 by FDA under an Emergency Use  Authorization (EUA).  This EUA will remain in effect (meaning this test can  be used) for the duration of  the COVID-19 declaration under Section 564(b)(1) of the Act, 21 U.S.C. section 360bbb-3(b)(1), unless the authorization is terminated or revoked sooner.    Influenza A by PCR NEGATIVE NEGATIVE Final   Influenza B by PCR NEGATIVE NEGATIVE Final    Comment: (NOTE) The Xpert Xpress SARS-CoV-2/FLU/RSV assay is intended as an aid in  the diagnosis of influenza from Nasopharyngeal swab specimens and  should not be used as a sole basis for treatment. Nasal washings and  aspirates are unacceptable for Xpert Xpress SARS-CoV-2/FLU/RSV  testing. Fact Sheet for Patients: https://www.moore.com/ Fact Sheet for Healthcare Providers: https://www.young.biz/ This test is not yet approved or cleared by the [place] FDA and  has been authorized for detection and/or diagnosis of SARS-CoV-2 by  FDA under an Emergency Use Authorization (EUA). This EUA will remain  in effect (meaning this test can be used) for the duration of the  Covid-19 declaration under Section 564(b)(1) of the Act, 21  U.S.C. section 360bbb-3(b)(1), unless the authorization is  terminated or revoked. Performed at [facility], [address] [place], [place] [address]      Radiological Exams on Admission: US Venous Img Lower Right (DVT Study)  Result Date: 07/22/2019 CLINICAL DATA:  Right leg swelling EXAM: RIGHT LOWER EXTREMITY VENOUS DOPPLER ULTRASOUND TECHNIQUE: Gray-scale sonography with compression, as well as color and duplex ultrasound, were performed to evaluate the deep venous system(s) from the level of the common femoral vein through the popliteal and proximal calf veins. COMPARISON:  None. FINDINGS: VENOUS Normal compressibility of the common femoral, superficial femoral and popliteal veins. The calf veins are limited in visualization secondary to surrounding soft  tissue edema. Visualized portions of profunda femoral vein and great saphenous vein unremarkable. No filling defects to suggest DVT on grayscale or color Doppler imaging. Doppler waveforms show normal direction of venous flow, normal respiratory phasicity and response to augmentation. Limited views of the contralateral common femoral vein are unremarkable. OTHER None. Limitations: none IMPRESSION: Limited evaluation of the calf veins, as described above, without evidence of right lower extremity DVT. If clinical symptoms are inconsistent or if there are persistent or worsening symptoms, further imaging (possibly involving the iliac veins) may be warranted. Electronically Signed   By: Aram Candela M.D.   On: 07/22/2019 21:24   DG CHEST PORT 1 VIEW  Result Date: 07/23/2019 CLINICAL DATA:  Right knee infection, COVID positive EXAM: PORTABLE CHEST 1 VIEW COMPARISON:  01/05/2019  FINDINGS: The heart size and mediastinal contours are within normal limits. Both lungs are clear. The visualized skeletal structures are unremarkable. IMPRESSION: No active disease. Electronically Signed   By: Jasmine Pang M.D.   On: 07/23/2019 01:15   DG Knee Complete 4 Views Right  Result Date: 07/22/2019 CLINICAL DATA:  Leg swelling.  IV drug use. EXAM: RIGHT KNEE - COMPLETE 4+ VIEW COMPARISON:  None. FINDINGS: No bony abnormalities. No fracture or effusion. No bony lesions. Soft tissue edema identified. IMPRESSION: Soft tissue edema/swelling.  No other abnormalities. Electronically Signed   By: Gerome Sam III M.D   On: 07/22/2019 20:47   Assessment/Plan   1. Cellulitis right lower extremity with severe sepsis   - Presents with pain, swelling, heat, and erythema involving the right knee and lower leg and is found to have marked leukocytosis, tachycardia, AKI, and elevated lactate  - No effusion or acute osseous findings on radiographs and no fluid could be aspirated from joint space in ED   - There is concern for  septic bursitis or abscess  - Blood cultures were collected, patient was given 30 cc/kg IVF, and started on vancomycin and Zosyn  - Orthopedic surgery was consulted by ED physician, recommends MRI   - Continue empiric vancomycin, trend inflammatory markers, follow cultures, check MRI, keep NPO pending surgeon's evaluation    2. Acute kidney injury  - SCr is 1.25, up from apparent baseline closer to 0.7  - He had been fluid-resuscitated in ED  - Renally-dose medications, avoid nephrotoxins, repeat chem panel    3. IV drug abuse  - Patient acknowledges ongoing heroin abuse  - Monitor for withdrawal, treat as needed  - Consult with TOC for possible resources   4. COVID-19  - COVID-19 screening test is positive  - No respiratory symptoms - Continue isolation, monitor for symptoms and treat as indicated     DVT prophylaxis: SCDs  Code Status: Full  Family Communication: Discussed with patient  Disposition Plan: Pending surgical clearance, likely 2-3 days  Consults called: Orthopedic surgery  Admission status: Inpatient     Briscoe Deutscher, MD [facility] Pager: See [email or web address]  If 7AM-7PM, please contact the daytime attending [email or web address]  07/23/2019, 5:30 AM

## 2019-07-24 LAB — CBC
HCT: 33.3 % — ABNORMAL LOW (ref 39.0–52.0)
Hemoglobin: 10.6 g/dL — ABNORMAL LOW (ref 13.0–17.0)
MCH: 28.3 pg (ref 26.0–34.0)
MCHC: 31.8 g/dL (ref 30.0–36.0)
MCV: 89 fL (ref 80.0–100.0)
Platelets: 339 10*3/uL (ref 150–400)
RBC: 3.74 MIL/uL — ABNORMAL LOW (ref 4.22–5.81)
RDW: 15 % (ref 11.5–15.5)
WBC: 26.8 10*3/uL — ABNORMAL HIGH (ref 4.0–10.5)
nRBC: 0 % (ref 0.0–0.2)

## 2019-07-24 LAB — SEDIMENTATION RATE: Sed Rate: 74 mm/hr — ABNORMAL HIGH (ref 0–16)

## 2019-07-24 LAB — BASIC METABOLIC PANEL
Anion gap: 11 (ref 5–15)
BUN: 21 mg/dL — ABNORMAL HIGH (ref 6–20)
CO2: 21 mmol/L — ABNORMAL LOW (ref 22–32)
Calcium: 7.8 mg/dL — ABNORMAL LOW (ref 8.9–10.3)
Chloride: 105 mmol/L (ref 98–111)
Creatinine, Ser: 1.22 mg/dL (ref 0.61–1.24)
GFR calc Af Amer: >60 mL/min (ref >60)
GFR calc non Af Amer: >60 mL/min (ref >60)
Glucose, Bld: 107 mg/dL — ABNORMAL HIGH (ref 70–99)
Potassium: 3.3 mmol/L — ABNORMAL LOW (ref 3.5–5.1)
Sodium: 137 mmol/L (ref 135–145)

## 2019-07-24 LAB — PHOSPHORUS: Phosphorus: 3.8 mg/dL (ref 2.5–4.6)

## 2019-07-24 LAB — MAGNESIUM: Magnesium: 1.6 mg/dL — ABNORMAL LOW (ref 1.7–2.4)

## 2019-07-24 LAB — C-REACTIVE PROTEIN: CRP: 27.6 mg/dL — ABNORMAL HIGH (ref <1.0)

## 2019-07-24 MED ORDER — CYCLOBENZAPRINE HCL 5 MG PO TABS
5.0000 mg | ORAL_TABLET | Freq: Three times a day (TID) | ORAL | Status: DC | PRN
  Administered 2019-07-24 – 2019-07-25 (×4): 5 mg via ORAL
  Filled 2019-07-24 (×5): qty 1

## 2019-07-24 MED ORDER — OXYCODONE-ACETAMINOPHEN 5-325 MG PO TABS: 1.0000 | ORAL_TABLET | Freq: Four times a day (QID) | ORAL | Status: DC | PRN

## 2019-07-24 MED ORDER — ACETAMINOPHEN 650 MG RE SUPP: 650.0000 mg | Freq: Four times a day (QID) | RECTAL | Status: DC | PRN

## 2019-07-24 MED ORDER — ACETAMINOPHEN 325 MG PO TABS
650.0000 mg | ORAL_TABLET | Freq: Four times a day (QID) | ORAL | Status: DC | PRN
  Administered 2019-07-24: 23:00:00 650 mg via ORAL
  Filled 2019-07-24: qty 2

## 2019-07-24 NOTE — Progress Notes (Signed)
Pt has a low grade temp. Mews initiated and medication given will continue to monitor.

## 2019-07-24 NOTE — Progress Notes (Signed)
PROGRESS NOTE  Grant Tapia PIR:[id number] DOB: 10/04/1985 DOA: 07/22/2019 PCP: Patient, No Pcp Per   LOS: 2 days   Brief narrative: As per HPI,  Grant Tapia is a 34 y.o. male with medical history significant for IV drug abuse, who presented to the hospital with complains of pain swelling and redness of the right knee.    Patient reported that he had been in his usual state of health, continues to use IV heroin, and then developed right knee pain on 07/21/2019 without any trauma or injury.  He woke the morning of 07/22/2019 with marked swelling and redness of the right knee and increased pain.   Upon arrival to the ED, patient is found to have a temperature of 100 F, tachycardia, and stable blood pressure.  Chemistry panel was notable for creatinine 1.25, up from an apparent baseline of 0.7.  CBC with leukocytosis to 28,600.  CRP is elevated to 32.5 and lactic acid is elevated to 2.2.  Radiographs of the knee were notable for soft tissue edema without effusion or acute bony abnormality.  Lower extremity venous Doppler was a limited study but no DVT was identified.  Joint aspiration was attempted by the ED physician but no fluid could be obtained.  Blood cultures were collected, IV fluids administered, and the patient was treated with vancomycin and Zosyn.  Orthopedic surgery was consulted by the ED physician and recommended medical admission with IV antibiotics and MRI.  COVID-19 PCR screening test resulted positive.  Assessment/Plan:  Principal Problem:   Infection of right knee (HCC) Active Problems:   Severe sepsis (HCC)   AKI (acute kidney injury) (HCC)   IV drug user   COVID-19 virus infection   Right knee possible septic arthritis bursitis, right leg cellulitis.  Status post MRI of the knee.  Orthopedics has seen the patient. Continue broad-spectrum antibiotic.  X-ray without acute findings.  Unable to aspirate.  On vancomycin IV.  Elevated leukocytosis on presentation  which slightly trended down.  T-max of 101.7 F.  Acute kidney injury.  Creatinine 1.5 on presentation.  Received IV fluids in the ED.  Creatinine of 1.34 at this time.  We will closely need to monitor on vancomycin.  IV drug abuse with mild withdrawal.  Heroin abuse.  No intention of quitting heroin.  Continue Bentyl loperamide Ativan.  Add Percocet for pain  Asymptomatic of Covid 19 infection.  Closely monitor for dyspnea.  Mild hypokalemia.  Will replace.  Will check BMP in a.m.   VTE Prophylaxis: Lovenox subcu  Code Status: Full code  Family Communication: None  Disposition Plan:  . Patient is from home . Likely disposition to home likely in 1 to 2 days. . Barriers to discharge: IV antibiotics, blood cultures, mild opiate withdrawal   Consultants:  orthopedics  Procedures:  Knee aspiration  Antibiotics:  . Vancomycin IV  Anti-infectives (From admission, onward)   Start     Dose/Rate Route Frequency Ordered Stop   07/23/19 1600  vancomycin (VANCOCIN) IVPB 1000 mg/200 mL premix     1,000 mg 200 mL/hr over 60 Minutes Intravenous Every 12 hours 07/23/19 1011     07/23/19 0600  vancomycin (VANCOREADY) IVPB 750 mg/150 mL  Status:  Discontinued     750 mg 150 mL/hr over 60 Minutes Intravenous Every 8 hours 07/23/19 0255 07/23/19 1011   07/22/19 2300  vancomycin (VANCOCIN) IVPB 1000 mg/200 mL premix     1,000 mg 200 mL/hr over 60 Minutes Intravenous  Once 07/22/19  2248 07/22/19 2359   07/22/19 2245  piperacillin-tazobactam (ZOSYN) IVPB 3.375 g     3.375 g 100 mL/hr over 30 Minutes Intravenous  Once 07/22/19 2231 07/22/19 2359     Subjective: Today, patient was seen and examined at bedside.  Complains of mild withdrawal symptoms including tremors, sweating and abdominal cramps.  Patient does not have any intention of quitting heroin.  Complaints of tenderness over the leg.  Swelling and erythema has improved.  Objective: Vitals:   07/24/19 0025 07/24/19 0443    BP:  134/82  Pulse:  100  Resp: 18 18  Temp:  99.4 F (37.4 C)  SpO2:  100%    Intake/Output Summary (Last 24 hours) at 07/24/2019 1122 Last data filed at 07/24/2019 0500 Gross per 24 hour  Intake 2423.92 ml  Output 400 ml  Net 2023.92 ml   Filed Weights   07/22/19 1950  Weight: 77.1 kg   Body mass index is 24.39 kg/m.   Physical Exam: GENERAL: Patient is alert awake and oriented. Not in obvious distress. HENT: No scleral pallor or icterus. Pupils equally reactive to light. Oral mucosa is moist NECK: is supple, no gross swelling noted. CHEST: Clear to auscultation. No crackles or wheezes.  Diminished breath sounds bilaterally. CVS: S1 and S2 heard, no murmur. Regular rate and rhythm.  ABDOMEN: Soft, non-tender, bowel sounds are present. EXTREMITIES: Right knee with mild swelling, right leg with tenderness and mild edema. CNS: Cranial nerves are intact. No focal motor deficits. SKIN: warm and dry , right leg with knee swelling and edema.  Data Review: I have personally reviewed the following laboratory data and studies,  CBC: Recent Labs  Lab 07/22/19 2109 07/23/19 0505 07/24/19 0354  WBC 28.6* 27.3* 26.8*  NEUTROABS 23.7* 20.2*  --   HGB 11.8* 10.8* 10.6*  HCT 36.6* 33.9* 33.3*  MCV 89.5 90.6 89.0  PLT 286 259 339   Basic Metabolic Panel: Recent Labs  Lab 07/22/19 2109 07/23/19 0505 07/24/19 0354  NA 135 137 137  K 3.6 3.9 3.3*  CL 101 104 105  CO2 24 22 21*  GLUCOSE 132* 124* 107*  BUN 16 17 21*  CREATININE 1.25* 1.34* 1.22  CALCIUM 8.5* 7.9* 7.8*  MG  --   --  1.6*  PHOS  --   --  3.8   Liver Function Tests: Recent Labs  Lab 07/22/19 2109  AST 33  ALT 32  ALKPHOS 76  BILITOT 0.7  PROT 7.2  ALBUMIN 2.7*   No results for input(s): LIPASE, AMYLASE in the last 168 hours. No results for input(s): AMMONIA in the last 168 hours. Cardiac Enzymes: No results for input(s): CKTOTAL, CKMB, CKMBINDEX, TROPONINI in the last 168 hours. BNP (last 3  results) No results for input(s): BNP in the last 8760 hours.  ProBNP (last 3 results) No results for input(s): PROBNP in the last 8760 hours.  CBG: No results for input(s): GLUCAP in the last 168 hours. Recent Results (from the past 240 hour(s))  Respiratory Panel by RT PCR (Flu A&B, Covid) - Nasopharyngeal Swab     Status: Abnormal   Collection Time: 07/22/19  9:09 PM   Specimen: Nasopharyngeal Swab  Result Value Ref Range Status   SARS Coronavirus 2 by RT PCR POSITIVE (A) NEGATIVE Final    Comment: RESULT CALLED TO, READ BACK BY AND VERIFIED WITH: MAYNARD,C AT 0022 ON [id number] BY CHERESNOWSKY,T (NOTE) SARS-CoV-2 target nucleic acids are DETECTED. SARS-CoV-2 RNA is generally detectable in upper respiratory specimens  during the acute phase of infection. Positive results are indicative of the presence of the identified virus, but do not rule out bacterial infection or co-infection with other pathogens not detected by the test. Clinical correlation with patient history and other diagnostic information is necessary to determine patient infection status. The expected result is Negative. Fact Sheet for Patients:  https://www.moore.com/ Fact Sheet for Healthcare Providers: https://www.young.biz/ This test is not yet approved or cleared by the [place] FDA and  has been authorized for detection and/or diagnosis of SARS-CoV-2 by FDA under an Emergency Use Authorization (EUA).  This EUA will remain in effect (meaning this test can  be used) for the duration of  the COVID-19 declaration under Section 564(b)(1) of the Act, 21 U.S.C. section 360bbb-3(b)(1), unless the authorization is terminated or revoked sooner.    Influenza A by PCR NEGATIVE NEGATIVE Final   Influenza B by PCR NEGATIVE NEGATIVE Final    Comment: (NOTE) The Xpert Xpress SARS-CoV-2/FLU/RSV assay is intended as an aid in  the diagnosis of influenza from Nasopharyngeal swab  specimens and  should not be used as a sole basis for treatment. Nasal washings and  aspirates are unacceptable for Xpert Xpress SARS-CoV-2/FLU/RSV  testing. Fact Sheet for Patients: https://www.moore.com/ Fact Sheet for Healthcare Providers: https://www.young.biz/ This test is not yet approved or cleared by the [place] FDA and  has been authorized for detection and/or diagnosis of SARS-CoV-2 by  FDA under an Emergency Use Authorization (EUA). This EUA will remain  in effect (meaning this test can be used) for the duration of the  Covid-19 declaration under Section 564(b)(1) of the Act, 21  U.S.C. section 360bbb-3(b)(1), unless the authorization is  terminated or revoked. Performed at [facility], [address] [place], [place] [address]   Culture, blood (routine x 2)     Status: None (Preliminary result)   Collection Time: 07/22/19  9:15 PM   Specimen: BLOOD LEFT ARM  Result Value Ref Range Status   Specimen Description   Final    BLOOD LEFT ARM Performed at [facility], [address] [place], [place] [address]    Special Requests   Final    BOTTLES DRAWN AEROBIC AND ANAEROBIC Blood Culture adequate volume Performed at [facility], [address] [place], [place] [address]    Culture   Final    NO GROWTH 2 DAYS Performed at [facility], [address] [place], [place] [address]    Report Status PENDING  Incomplete  Culture, blood (routine x 2)     Status: None (Preliminary result)   Collection Time: 07/22/19  9:25 PM   Specimen: BLOOD RIGHT ARM  Result Value Ref Range Status   Specimen Description   Final    BLOOD RIGHT ARM Performed at [facility], [address] [place], [place] [address]    Special Requests   Final    BOTTLES DRAWN AEROBIC AND ANAEROBIC Blood Culture results may not be optimal due to an inadequate volume of blood received in culture  bottles Performed at [facility], [address] [place], [place] [address]    Culture   Final    NO GROWTH 2 DAYS Performed at [facility], [address], [place], [place] [address]    Report Status PENDING  Incomplete  Culture, blood (single)     Status: None (Preliminary result)   Collection Time: 07/22/19  9:30 PM   Specimen: BLOOD RIGHT HAND  Result Value Ref Range Status   Specimen Description   Final    BLOOD RIGHT HAND Performed at [facility], [address] [place], [place] [address]    Special Requests   Final    BOTTLES DRAWN AEROBIC AND ANAEROBIC Blood Culture adequate volume Performed at [facility], [address] [place], [place] [address]    Culture   Final    NO GROWTH 2 DAYS Performed at [facility], [place] [address], [place], [place] [address]    Report Status PENDING  Incomplete     Studies: MR TIBIA FIBULA RIGHT WO CONTRAST  Result Date: 07/23/2019 CLINICAL DATA:  Right knee and lower leg swelling. History of IV drug abuse and COVID-19 infection. Clinical concern for infection. EXAM: MRI OF LOWER RIGHT EXTREMITY WITHOUT CONTRAST TECHNIQUE: Multiplanar, multisequence MR imaging of the right lower leg was performed. No intravenous contrast was administered. COMPARISON:  Radiographs 07/22/2019 and 01/08/2019. FINDINGS: Bones/Joint/Cartilage Both lower legs are included on the coronal images. Examination extends from just above the knee through the distal lower leg. The ankle is not included. There is no evidence of acute fracture, dislocation or bone destruction. The right tibia and fibula appear normal. No significant knee joint effusion or arthropathy. Ligaments Not relevant for exam/indication. The cruciate ligaments appear intact at the knee. Muscles and Tendons No focal intramuscular fluid collection or significant muscular edema. The patellar tendon and visualized Achilles tendon appear normal. Soft tissues  There is extensive subcutaneous edema and ill-defined fluid medially, extending from just above the knee into the distal lower leg. Ill-defined fluid tracks between the medial head of the gastrocnemius muscle and the soleus muscle, measuring up to 6 mm in thickness. No focal fluid collection identified on noncontrast imaging. No evidence of foreign body or soft tissue emphysema. No gross vascular abnormality. IMPRESSION: 1. Extensive subcutaneous edema and ill-defined fluid medially in the right lower leg, extending from just above the knee into the distal lower leg. Findings are consistent with cellulitis and possible fasciitis. No focal fluid collection or intramuscular abnormality identified. 2. No evidence of osteomyelitis or septic joint at the right knee. Electronically Signed   By: Richardean Sale M.D.   On: 07/23/2019 11:11   US Venous Img Lower Right (DVT Study)  Result Date: 07/22/2019 CLINICAL DATA:  Right leg swelling EXAM: RIGHT LOWER EXTREMITY VENOUS DOPPLER ULTRASOUND TECHNIQUE: Gray-scale sonography with compression, as well as color and duplex ultrasound, were performed to evaluate the deep venous system(s) from the level of the common femoral vein through the popliteal and proximal calf veins. COMPARISON:  None. FINDINGS: VENOUS Normal compressibility of the common femoral, superficial femoral and popliteal veins. The calf veins are limited in visualization secondary to surrounding soft tissue edema. Visualized portions of profunda femoral vein and great saphenous vein unremarkable. No filling defects to suggest DVT on grayscale or color Doppler imaging. Doppler waveforms show normal direction of venous flow, normal respiratory phasicity and response to augmentation. Limited views of the contralateral common femoral vein are unremarkable. OTHER None. Limitations: none IMPRESSION: Limited evaluation of the calf veins, as described above, without evidence of right lower extremity DVT. If clinical  symptoms are inconsistent or if there are persistent or worsening symptoms, further imaging (possibly involving the iliac veins) may be warranted. Electronically Signed   By: Virgina Norfolk M.D.   On: 07/22/2019 21:24   DG CHEST PORT 1  VIEW  Result Date: 07/23/2019 CLINICAL DATA:  Right knee infection, COVID positive EXAM: PORTABLE CHEST 1 VIEW COMPARISON:  01/05/2019 FINDINGS: The heart size and mediastinal contours are within normal limits. Both lungs are clear. The visualized skeletal structures are unremarkable. IMPRESSION: No active disease. Electronically Signed   By: Jasmine Pang M.D.   On: 07/23/2019 01:15   DG Knee Complete 4 Views Right  Result Date: 07/22/2019 CLINICAL DATA:  Leg swelling.  IV drug use. EXAM: RIGHT KNEE - COMPLETE 4+ VIEW COMPARISON:  None. FINDINGS: No bony abnormalities. No fracture or effusion. No bony lesions. Soft tissue edema identified. IMPRESSION: Soft tissue edema/swelling.  No other abnormalities. Electronically Signed   By: Gerome Sam III M.D   On: 07/22/2019 20:47      Joycelyn Das, MD  [facility] 07/24/2019

## 2019-07-25 DIAGNOSIS — E876 Hypokalemia: Secondary | ICD-10-CM

## 2019-07-25 DIAGNOSIS — L03115 Cellulitis of right lower limb: Secondary | ICD-10-CM

## 2019-07-25 LAB — COMPREHENSIVE METABOLIC PANEL
ALT: 16 U/L (ref 0–44)
AST: 16 U/L (ref 15–41)
Albumin: 2.1 g/dL — ABNORMAL LOW (ref 3.5–5.0)
Alkaline Phosphatase: 79 U/L (ref 38–126)
Anion gap: 10 (ref 5–15)
BUN: 18 mg/dL (ref 6–20)
CO2: 21 mmol/L — ABNORMAL LOW (ref 22–32)
Calcium: 8.3 mg/dL — ABNORMAL LOW (ref 8.9–10.3)
Chloride: 108 mmol/L (ref 98–111)
Creatinine, Ser: 1.03 mg/dL (ref 0.61–1.24)
GFR calc Af Amer: >60 mL/min (ref >60)
GFR calc non Af Amer: >60 mL/min (ref >60)
Glucose, Bld: 129 mg/dL — ABNORMAL HIGH (ref 70–99)
Potassium: 3.4 mmol/L — ABNORMAL LOW (ref 3.5–5.1)
Sodium: 139 mmol/L (ref 135–145)
Total Bilirubin: 0.5 mg/dL (ref 0.3–1.2)
Total Protein: 6.3 g/dL — ABNORMAL LOW (ref 6.5–8.1)

## 2019-07-25 LAB — CBC
HCT: 34.2 % — ABNORMAL LOW (ref 39.0–52.0)
Hemoglobin: 10.9 g/dL — ABNORMAL LOW (ref 13.0–17.0)
MCH: 27.9 pg (ref 26.0–34.0)
MCHC: 31.9 g/dL (ref 30.0–36.0)
MCV: 87.7 fL (ref 80.0–100.0)
Platelets: 417 10*3/uL — ABNORMAL HIGH (ref 150–400)
RBC: 3.9 MIL/uL — ABNORMAL LOW (ref 4.22–5.81)
RDW: 15 % (ref 11.5–15.5)
WBC: 27.8 10*3/uL — ABNORMAL HIGH (ref 4.0–10.5)
nRBC: 0 % (ref 0.0–0.2)

## 2019-07-25 LAB — CREATININE, SERUM
Creatinine, Ser: 1.01 mg/dL (ref 0.61–1.24)
GFR calc Af Amer: >60 mL/min (ref >60)
GFR calc non Af Amer: >60 mL/min (ref >60)

## 2019-07-25 LAB — C-REACTIVE PROTEIN: CRP: 19.2 mg/dL — ABNORMAL HIGH (ref <1.0)

## 2019-07-25 LAB — SEDIMENTATION RATE: Sed Rate: 75 mm/hr — ABNORMAL HIGH (ref 0–16)

## 2019-07-25 MED ORDER — POTASSIUM CHLORIDE CRYS ER 20 MEQ PO TBCR
40.0000 meq | EXTENDED_RELEASE_TABLET | Freq: Once | ORAL | Status: AC
  Administered 2019-07-25: 40 meq via ORAL
  Filled 2019-07-25: qty 2

## 2019-07-25 NOTE — Progress Notes (Signed)
Pharmacy Antibiotic Note  Grant Tapia is a 34 y.o. male admitted on 07/22/2019 with RLE cellulitis.  Pharmacy has been consulted for Vancomycin dosing. Pt with possible septic knee secondary to IV drug use.   Day #4 antibiotic therapy, Vanc Tm 100.5, WBC remains markedly elevated.  CRP decreased to 19, Sed rate increased to 75 4/11 MRI: cellulitis and possible fasciitis. No evidence of osteomyelitis or septic joint at the right knee. AKI- Scr elevated, but decreased to 1.03 (Baseline Scr 0.7)  Plan: Continue Vancomycin 1g IV q12h. If continued, consider checking Vanc peak and trough as needed.  Goal AUC = 400 - 550.   Expected AUC 482 using SCr 1.03 Follow up renal function, culture results, and clinical course.   Height: 5\' 10"  (177.8 cm) Weight: 77.1 kg (170 lb) IBW/kg (Calculated) : 73  Temp (24hrs), Avg:99.8 F (37.7 C), Min:99.2 F (37.3 C), Max:100.5 F (38.1 C)  Recent Labs  Lab 07/22/19 2109 07/22/19 2350 07/23/19 0505 07/24/19 0354 07/25/19 0446 07/25/19 0720  WBC 28.6*  --  27.3* 26.8*  --  27.8*  CREATININE 1.25*  --  1.34* 1.22 1.01 1.03  LATICACIDVEN 2.2* 2.5*  --   --   --   --     Estimated Creatinine Clearance: 107.3 mL/min (by C-G formula based on SCr of 1.03 mg/dL).    Allergies  Allergen Reactions  . Bee Venom Swelling   Antimicrobials this admission:  4/10 Zosyn x1 4/10 Vanc >>   Dose adjustments this admission:  4/11: Dec Vanc 750 q8 >> 1gm IV q12h for declining renal fxn  Microbiology results: 4/10 COVID +, Influenza negative 4/10 BCx (x3): NGTD  6/10 PharmD, BCPS Pager: [phone] 07/25/2019 12:49 PM

## 2019-07-25 NOTE — TOC Initial Note (Signed)
Transition of Care [facility]) - Initial/Assessment Note    Patient Details  Name: Grant Tapia MRN: [id number] Date of Birth: [birth year]  Transition of Care (TOC) CM/SW Contact:    Joaquin Courts, RN Phone Number: 07/25/2019, 11:09 AM  Clinical Narrative:         CM noted TOC consult for substance abuse.  Of note, patient is not interested in quitting.  CM will sign off at this time.          Expected Discharge Plan: Home/Self Care Barriers to Discharge: Continued Medical Work up   Patient Goals and CMS Choice        Expected Discharge Plan and Services Expected Discharge Plan: Home/Self Care       Living arrangements for the past 2 months: Single Family Home                 DME Arranged: N/A DME Agency: NA       HH Arranged: NA [place] Agency: NA        Prior Living Arrangements/Services Living arrangements for the past 2 months: [place]   Patient language and need for interpreter reviewed:: Yes Do you feel safe going back to the place where you live?: Yes            Criminal Activity/Legal Involvement Pertinent to Current Situation/Hospitalization: No - Comment as needed  Activities of Daily Living Home Assistive Devices/Equipment: None ADL Screening (condition at time of admission) Patient's cognitive ability adequate to safely complete daily activities?: Yes Is the patient deaf or have difficulty hearing?: No Does the patient have difficulty seeing, even when wearing glasses/contacts?: No Does the patient have difficulty concentrating, remembering, or making decisions?: No Patient able to express need for assistance with ADLs?: Yes Does the patient have difficulty dressing or bathing?: No Independently performs ADLs?: Yes (appropriate for developmental age) Does the patient have difficulty walking or climbing stairs?: Yes Weakness of Legs: None Weakness of Arms/Hands: None  Permission Sought/Granted                   Emotional Assessment           Psych Involvement: No (comment)  Admission diagnosis:  Right knee skin infection [L08.9] Pain and swelling of lower leg, right [Y86.578, M79.89] COVID-19 virus infection [U07.1] Patient Active Problem List   Diagnosis Date Noted  . Right knee skin infection 07/23/2019  . COVID-19 virus infection 07/23/2019  . Infection of right knee ([place]) 07/22/2019  . HTN (hypertension), benign 04/15/2019  . Poisoning by heroin, accidental (unintentional), initial encounter ([place]) 05/08/2017  . Heroin dependence ([place]) 05/08/2017  . Back pain 12/30/2016  . Abnormal LFTs 12/30/2016  . Polysubstance abuse ([place])   . Cellulitis 11/19/2016  . IV drug user 11/19/2016  . Neck abscess 11/19/2016  . Delirium   . Tachycardia   . Overdose 07/18/2016  . Drug withdrawal ([place]) 07/16/2016  . AKI (acute kidney injury) ([place]) 07/15/2016  . Alcohol abuse 07/15/2016  . Heroin abuse ([place]) 07/15/2016  . 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 Cocaine abuse ([place]) 07/15/2016  . Acute metabolic encephalopathy 46/96/2952  . Lactic acidosis 03/29/2014  . Severe sepsis ([place]) 03/27/2014  . Chest pain   . Headache   . Neck pain    PCP:  Patient, No Pcp Per Pharmacy:   [facility] [place] [facility] [address] [place] [address]  Phone: [phone] Fax: [phone]  [id number] - [place] [facility] - [address] [place] [address] Phone: [phone] Fax: [phone]  [facility] [place], [place] - [address] [place] [address] Phone: [phone] Fax: [phone]     Social Determinants of Health (SDOH) Interventions    Readmission Risk Interventions Readmission Risk Prevention Plan 04/18/2019  Transportation Screening Complete  PCP or Specialist Appt within 5-7 Days Complete  Home Care Screening  Complete  Medication Review (RN CM) Referral to Pharmacy  Some recent data might be hidden

## 2019-07-25 NOTE — Progress Notes (Signed)
PROGRESS NOTE  Grant Tapia MEQ:[id number] DOB: December 19, 1985 DOA: 07/22/2019 PCP: Patient, No Pcp Per   LOS: 3 days   Brief narrative: As per HPI,  Grant Tapia is a 34 y.o. male with medical history significant for IV drug abuse, who presented to the hospital with complains of pain swelling and redness of the right knee.    Patient reported that he had been in his usual state of health, continues to use IV heroin, and then developed right knee pain on 07/21/2019 without any trauma or injury.  He woke the morning of 07/22/2019 with marked swelling and redness of the right knee and increased pain.   Upon arrival to the ED, patient is found to have a temperature of 100 F, tachycardia, and stable blood pressure.  Chemistry panel was notable for creatinine 1.25, up from an apparent baseline of 0.7.  CBC with leukocytosis to 28,600.  CRP is elevated to 32.5 and lactic acid is elevated to 2.2.  Radiographs of the knee were notable for soft tissue edema without effusion or acute bony abnormality.  Lower extremity venous Doppler was a limited study but no DVT was identified.  Joint aspiration was attempted by the ED physician but no fluid could be obtained.  Blood cultures were collected, IV fluids administered, and the patient was treated with vancomycin and Zosyn.  Orthopedic surgery was consulted by the ED physician and recommended medical admission with IV antibiotics and MRI.  COVID-19 PCR screening test resulted positive.  Assessment/Plan:  Principal Problem:   Infection of right knee (HCC) Active Problems:   Severe sepsis ([place])   AKI (acute kidney injury) ([place])   IV drug user   COVID-19 virus infection   Right knee/ right leg cellulitis.  Status post MRI of the knee.  Orthopedics has seen the patient. Continue broad-spectrum antibiotic.  X-ray without acute findings.  Unable to aspirate.  On vancomycin IV.  Elevated leukocytosis on presentation which slightly trended down.  T-max  of 100.5 F.  WBC is elevated at 27.8 from 26.8.  Will need to continue to monitor for leukocytosis.  If there is rising trend might have to rescan the area and consider echo.  2D echocardiogram done on 04/15/2019 showed normal LV function and valvular area.  Blood cultures negative in 3 days.  History of staph aureus abscess.  Acute kidney injury.  Creatinine 1.5 on presentation.  Received IV fluids in the ED.  Creatinine of 1.03 at this time.  We will closely need to monitor on vancomycin.  IV drug abuse with mild withdrawal.  Heroin abuse.  No intention of quitting heroin.  Continue Bentyl loperamide Ativan.  Added Percocet for pain.  Has a mild sweating today.  No overt withdrawals.  Asymptomatic of Covid 19 infection.  Closely monitor for dyspnea.  Mild hypokalemia.  We will continue to replenish.  Potassium 3.4 today.   VTE Prophylaxis: Lovenox subcu  Code Status: Full code  Family Communication: None  Disposition Plan:  . Patient is from home . Likely disposition to home likely in 1 to 2 days. . Barriers to discharge: IV antibiotics, persistent fever and leukocytosis, , mild opiate withdrawal   Consultants:  orthopedics  Procedures:  Knee aspiration  Antibiotics:  . Vancomycin IV  Anti-infectives (From admission, onward)   Start     Dose/Rate Route Frequency Ordered Stop   07/23/19 1600  vancomycin (VANCOCIN) IVPB 1000 mg/200 mL premix     1,000 mg 200 mL/hr over 60 Minutes Intravenous  Every 12 hours 07/23/19 1011     07/23/19 0600  vancomycin (VANCOREADY) IVPB 750 mg/150 mL  Status:  Discontinued     750 mg 150 mL/hr over 60 Minutes Intravenous Every 8 hours 07/23/19 0255 07/23/19 1011   07/22/19 2300  vancomycin (VANCOCIN) IVPB 1000 mg/200 mL premix     1,000 mg 200 mL/hr over 60 Minutes Intravenous  Once 07/22/19 2248 07/22/19 2359   07/22/19 2245  piperacillin-tazobactam (ZOSYN) IVPB 3.375 g     3.375 g 100 mL/hr over 30 Minutes Intravenous  Once 07/22/19  2231 07/22/19 2359     Subjective:  Today, patient was seen and examined at bedside.  Complains of mild sweating.  No cramps nausea vomiting.  Complains of pain at the leg site.  Objective: Vitals:   07/25/19 0151 07/25/19 0554  BP: 123/75 123/82  Pulse: 80 79  Resp: 16 (!) 23  Temp: 99.8 F (37.7 C) 99.2 F (37.3 C)  SpO2: 100% 100%    Intake/Output Summary (Last 24 hours) at 07/25/2019 1111 Last data filed at 07/24/2019 1824 Gross per 24 hour  Intake 1357.14 ml  Output 850 ml  Net 507.14 ml   Filed Weights   07/22/19 1950  Weight: 77.1 kg   Body mass index is 24.39 kg/m.   Physical Exam: GENERAL: Patient is alert awake and oriented. Not in obvious distress. HENT: No scleral pallor or icterus. Pupils equally reactive to light. Oral mucosa is moist NECK: is supple, no gross swelling noted. CHEST: Clear to auscultation. No crackles or wheezes.  Diminished breath sounds bilaterally. CVS: S1 and S2 heard, no murmur. Regular rate and rhythm.  ABDOMEN: Soft, non-tender, bowel sounds are present. EXTREMITIES: Right knee with mild swelling, right leg with tenderness and mild edema over the medial aspect of the leg. CNS: Cranial nerves are intact. No focal motor deficits. SKIN: warm and dry , right leg with knee swelling and edema.  Data Review: I have personally reviewed the following laboratory data and studies,  CBC: Recent Labs  Lab 07/22/19 2109 07/23/19 0505 07/24/19 0354 07/25/19 0720  WBC 28.6* 27.3* 26.8* 27.8*  NEUTROABS 23.7* 20.2*  --   --   HGB 11.8* 10.8* 10.6* 10.9*  HCT 36.6* 33.9* 33.3* 34.2*  MCV 89.5 90.6 89.0 87.7  PLT 286 259 339 417*   Basic Metabolic Panel: Recent Labs  Lab 07/22/19 2109 07/23/19 0505 07/24/19 0354 07/25/19 0446 07/25/19 0720  NA 135 137 137  --  139  K 3.6 3.9 3.3*  --  3.4*  CL 101 104 105  --  108  CO2 24 22 21*  --  21*  GLUCOSE 132* 124* 107*  --  129*  BUN 16 17 21*  --  18  CREATININE 1.25* 1.34* 1.22 1.01  1.03  CALCIUM 8.5* 7.9* 7.8*  --  8.3*  MG  --   --  1.6*  --   --   PHOS  --   --  3.8  --   --    Liver Function Tests: Recent Labs  Lab 07/22/19 2109 07/25/19 0720  AST 33 16  ALT 32 16  ALKPHOS 76 79  BILITOT 0.7 0.5  PROT 7.2 6.3*  ALBUMIN 2.7* 2.1*   No results for input(s): LIPASE, AMYLASE in the last 168 hours. No results for input(s): AMMONIA in the last 168 hours. Cardiac Enzymes: No results for input(s): CKTOTAL, CKMB, CKMBINDEX, TROPONINI in the last 168 hours. BNP (last 3 results) No results for  input(s): BNP in the last 8760 hours.  ProBNP (last 3 results) No results for input(s): PROBNP in the last 8760 hours.  CBG: No results for input(s): GLUCAP in the last 168 hours. Recent Results (from the past 240 hour(s))  Respiratory Panel by RT PCR (Flu A&B, Covid) - Nasopharyngeal Swab     Status: Abnormal   Collection Time: 07/22/19  9:09 PM   Specimen: Nasopharyngeal Swab  Result Value Ref Range Status   SARS Coronavirus 2 by RT PCR POSITIVE (A) NEGATIVE Final    Comment: RESULT CALLED TO, READ BACK BY AND VERIFIED WITH: MAYNARD,C AT 0022 ON [id number] BY CHERESNOWSKY,T (NOTE) SARS-CoV-2 target nucleic acids are DETECTED. SARS-CoV-2 RNA is generally detectable in upper respiratory specimens  during the acute phase of infection. Positive results are indicative of the presence of the identified virus, but do not rule out bacterial infection or co-infection with other pathogens not detected by the test. Clinical correlation with patient history and other diagnostic information is necessary to determine patient infection status. The expected result is Negative. Fact Sheet for Patients:  https://www.moore.com/ Fact Sheet for Healthcare Providers: https://www.young.biz/ This test is not yet approved or cleared by the [place] FDA and  has been authorized for detection and/or diagnosis of SARS-CoV-2 by FDA under an  Emergency Use Authorization (EUA).  This EUA will remain in effect (meaning this test can  be used) for the duration of  the COVID-19 declaration under Section 564(b)(1) of the Act, 21 U.S.C. section 360bbb-3(b)(1), unless the authorization is terminated or revoked sooner.    Influenza A by PCR NEGATIVE NEGATIVE Final   Influenza B by PCR NEGATIVE NEGATIVE Final    Comment: (NOTE) The Xpert Xpress SARS-CoV-2/FLU/RSV assay is intended as an aid in  the diagnosis of influenza from Nasopharyngeal swab specimens and  should not be used as a sole basis for treatment. Nasal washings and  aspirates are unacceptable for Xpert Xpress SARS-CoV-2/FLU/RSV  testing. Fact Sheet for Patients: https://www.moore.com/ Fact Sheet for Healthcare Providers: https://www.young.biz/ This test is not yet approved or cleared by the [place] FDA and  has been authorized for detection and/or diagnosis of SARS-CoV-2 by  FDA under an Emergency Use Authorization (EUA). This EUA will remain  in effect (meaning this test can be used) for the duration of the  Covid-19 declaration under Section 564(b)(1) of the Act, 21  U.S.C. section 360bbb-3(b)(1), unless the authorization is  terminated or revoked. Performed at [facility], [address] [place], [place] [address]   Culture, blood (routine x 2)     Status: None (Preliminary result)   Collection Time: 07/22/19  9:15 PM   Specimen: BLOOD LEFT ARM  Result Value Ref Range Status   Specimen Description   Final    BLOOD LEFT ARM Performed at [facility], [address] [place], [place] [address]    Special Requests   Final    BOTTLES DRAWN AEROBIC AND ANAEROBIC Blood Culture adequate volume Performed at [facility], [address] [place], [place] [address]    Culture   Final    NO GROWTH 3 DAYS Performed at [facility], [address], [place], [place] [address]     Report Status PENDING  Incomplete  Culture, blood (routine x 2)     Status: None (Preliminary result)   Collection Time: 07/22/19  9:25 PM   Specimen: BLOOD RIGHT ARM  Result Value Ref Range Status  Specimen Description   Final    BLOOD RIGHT ARM Performed at [facility], [address] [place], [place] [address]    Special Requests   Final    BOTTLES DRAWN AEROBIC AND ANAEROBIC Blood Culture results may not be optimal due to an inadequate volume of blood received in culture bottles Performed at [facility], [address] [place], [place] [address]    Culture   Final    NO GROWTH 3 DAYS Performed at [facility], [address] [place], [place] [address]    Report Status PENDING  Incomplete  Culture, blood (single)     Status: None (Preliminary result)   Collection Time: 07/22/19  9:30 PM   Specimen: BLOOD RIGHT HAND  Result Value Ref Range Status   Specimen Description   Final    BLOOD RIGHT HAND Performed at [facility], [address] [place], [place] [address]    Special Requests   Final    BOTTLES DRAWN AEROBIC AND ANAEROBIC Blood Culture adequate volume Performed at [facility], [address] [place], [place] [address]    Culture   Final    NO GROWTH 3 DAYS Performed at [facility], [address] [place], [place] [address]    Report Status PENDING  Incomplete     Studies: No results found.    Joycelyn Das, MD  [facility] 07/25/2019

## 2019-07-26 LAB — BASIC METABOLIC PANEL
Anion gap: 9 (ref 5–15)
BUN: 18 mg/dL (ref 6–20)
CO2: 22 mmol/L (ref 22–32)
Calcium: 8.5 mg/dL — ABNORMAL LOW (ref 8.9–10.3)
Chloride: 110 mmol/L (ref 98–111)
Creatinine, Ser: 1 mg/dL (ref 0.61–1.24)
GFR calc Af Amer: >60 mL/min (ref >60)
GFR calc non Af Amer: >60 mL/min (ref >60)
Glucose, Bld: 140 mg/dL — ABNORMAL HIGH (ref 70–99)
Potassium: 3.7 mmol/L (ref 3.5–5.1)
Sodium: 141 mmol/L (ref 135–145)

## 2019-07-26 LAB — MAGNESIUM: Magnesium: 1.7 mg/dL (ref 1.7–2.4)

## 2019-07-26 LAB — CBC
HCT: 38.2 % — ABNORMAL LOW (ref 39.0–52.0)
Hemoglobin: 12 g/dL — ABNORMAL LOW (ref 13.0–17.0)
MCH: 28.5 pg (ref 26.0–34.0)
MCHC: 31.4 g/dL (ref 30.0–36.0)
MCV: 90.7 fL (ref 80.0–100.0)
Platelets: 540 10*3/uL — ABNORMAL HIGH (ref 150–400)
RBC: 4.21 MIL/uL — ABNORMAL LOW (ref 4.22–5.81)
RDW: 15.2 % (ref 11.5–15.5)
WBC: 24 10*3/uL — ABNORMAL HIGH (ref 4.0–10.5)
nRBC: 0 % (ref 0.0–0.2)

## 2019-07-26 LAB — C-REACTIVE PROTEIN: CRP: 10.9 mg/dL — ABNORMAL HIGH (ref <1.0)

## 2019-07-26 LAB — PHOSPHORUS: Phosphorus: 3.9 mg/dL (ref 2.5–4.6)

## 2019-07-26 MED ORDER — DOXYCYCLINE HYCLATE 100 MG PO TABS: 100.0000 mg | ORAL_TABLET | Freq: Two times a day (BID) | ORAL | 0 refills | Status: DC

## 2019-07-26 MED ORDER — CYCLOBENZAPRINE HCL 5 MG PO TABS: 5.0000 mg | ORAL_TABLET | Freq: Three times a day (TID) | ORAL | 0 refills | Status: DC | PRN

## 2019-07-26 MED ORDER — IBUPROFEN 200 MG PO TABS: 600.0000 mg | ORAL_TABLET | Freq: Four times a day (QID) | ORAL | 0 refills | Status: DC | PRN

## 2019-07-26 MED ORDER — AMLODIPINE BESYLATE 2.5 MG PO TABS: 2.5000 mg | ORAL_TABLET | Freq: Every day | ORAL | 0 refills | Status: DC

## 2019-07-26 MED ORDER — AMOXICILLIN-POT CLAVULANATE 875-125 MG PO TABS: 1.0000 | ORAL_TABLET | Freq: Two times a day (BID) | ORAL | 0 refills | Status: DC

## 2019-07-26 NOTE — Discharge Summary (Signed)
Physician Discharge Summary  Grant Tapia AJO:[id number] DOB: Nov 25, 1985 DOA: 07/22/2019  PCP: Patient, No Pcp Per  Admit date: 07/22/2019 Discharge date: 07/26/2019  Admitted From: Home  Discharge disposition: Home   Recommendations for Outpatient Follow-Up:   . Follow up with your primary care provider in one week.  . Check CBC, BMP in the next visit . Patient did have significant clinical improvement but did have leukocytosis and mild grade fever.  He has been prescribed Augmentin and doxycycline on discharge.  Please closely monitor for resolution.   Discharge Diagnosis:   Principal Problem:   Infection of right knee (HCC) Active Problems:   Severe sepsis (HCC)   AKI (acute kidney injury) (HCC)   IV drug user   COVID-19 virus infection   Discharge Condition: Improved.  Diet recommendation:   Regular.  Wound care: None.  Code status: Full.   History of Present Illness:   Grant Stefanski Carsonis a 34 y.o.malewith medical history significant forIV drug abuse, who presented to the hospital with complains of pain swelling and redness of the right knee.   Patient reported that he had been in his usual state of health, continues to use IV heroin, and then developed right knee pain on 07/21/2019 without any trauma or injury. He woke the morning of 07/22/2019 with marked swelling and redness of the right knee and increased pain. Upon arrival to the ED, patient is found to have a temperature of 100F,tachycardia, and stable blood pressure. Chemistry panel was notable for creatinine 1.25, up from an apparent baseline of 0.7. CBC with leukocytosis to 28,600. CRP is elevated to 32.5 and lactic acid is elevated to 2.2. Radiographs of the knee were notable for soft tissue edema without effusion or acute bony abnormality. Lower extremity venous Doppler was a limited study but no DVT was identified. Joint aspiration was attempted by the ED physician but no fluid  could be obtained. Blood cultures were collected, IV fluids administered, and the patient was treated with vancomycin and Zosyn. Orthopedic surgery was consulted by the ED physician and recommended medical admission with IV antibiotics and MRI. COVID-19 PCR screening test resulted positive.   Hospital Course:   Following conditions were addressed during hospitalization as listed below,  Right knee/ right leg cellulitis.  Status post MRI of the knee. Orthopedics evaluated the patient and recommended continuation of antibiotic for superficial colitis.  Patient was continued on IV vancomycin with improvement in his cellulitis and tenderness.  He did have mild leukocytopenia but no fever.   X-ray without acute findings. Unable to aspirate.2D echocardiogram done on 04/15/2019 showed normal LV function and valvular area.  Blood cultures negative in 4 days.  History of staph aureus abscess.  Patient will be continued on doxycycline and Augmentin on discharge to complete the course.  Acute kidney injury. Creatinine 1.5 on presentation. Received IV fluids in the ED. Creatinine of 1.0  on discharge.  IV drug abuse with mild withdrawal. Heroin abuse.  No intention of quitting heroin.  Patient received Bentyl loperamide Ativan during hospitalization.  Asymptomatic of Covid 19 infection.  Remained asymptomatic.  Mild hypokalemia.    Replenished.  Potassium prior to discharge was 3.7.  Disposition.  At this time, patient is stable for disposition home.  He will have to follow-up with his primary care physician in 1 week.  Patient stated that he will follow up with his primary care physician.  Patient also stated that he would be able to get his medications on discharge.  Medical Consultants:    Orthopedics  Procedures:    None Subjective:   Today, patient continues to feel better.  Much less pain in the legs with decreased edema and swelling.  Able to move the knee without much  problem.  Discharge Exam:   Vitals:   07/25/19 2201 07/26/19 0417  BP: (!) 150/96 134/78  Pulse: 66 (!) 53  Resp: 16 16  Temp: 99.4 F (37.4 C) 100 F (37.8 C)  SpO2: 100% 100%   Vitals:   07/25/19 0554 07/25/19 1416 07/25/19 2201 07/26/19 0417  BP: 123/82 138/88 (!) 150/96 134/78  Pulse: 79 64 66 (!) 53  Resp: (!) 23 20 16 16   Temp: 99.2 F (37.3 C) 98.2 F (36.8 C) 99.4 F (37.4 C) 100 F (37.8 C)  TempSrc: Oral Oral Oral Oral  SpO2: 100% 99% 100% 100%  Weight:      Height:       General: Alert awake, not in obvious distress HENT: pupils equally reacting to light,  No scleral pallor or icterus noted. Oral mucosa is moist.  Chest:  Clear breath sounds.  Diminished breath sounds bilaterally. No crackles or wheezes.  CVS: S1 &S2 heard. No murmur.  Regular rate and rhythm. Abdomen: Soft, nontender, nondistended.  Bowel sounds are heard.   Extremities: No cyanosis, clubbing or edema.  Peripheral pulses are palpable.  Right knee and leg area with improved cellulitis, minimal erythema, minimal tenderness on palpation. Psych: Alert, awake and oriented, normal mood CNS:  No cranial nerve deficits.  Power equal in all extremities.   Skin: Warm and dry.  Healed scab over the abdomen.  The results of significant diagnostics from this hospitalization (including imaging, microbiology, ancillary and laboratory) are listed below for reference.     Diagnostic Studies:   MR TIBIA FIBULA RIGHT WO CONTRAST  Result Date: 07/23/2019 CLINICAL DATA:  Right knee and lower leg swelling. History of IV drug abuse and COVID-19 infection. Clinical concern for infection. EXAM: MRI OF LOWER RIGHT EXTREMITY WITHOUT CONTRAST TECHNIQUE: Multiplanar, multisequence MR imaging of the right lower leg was performed. No intravenous contrast was administered. COMPARISON:  Radiographs 07/22/2019 and 01/08/2019. FINDINGS: Bones/Joint/Cartilage Both lower legs are included on the coronal images. Examination  extends from just above the knee through the distal lower leg. The ankle is not included. There is no evidence of acute fracture, dislocation or bone destruction. The right tibia and fibula appear normal. No significant knee joint effusion or arthropathy. Ligaments Not relevant for exam/indication. The cruciate ligaments appear intact at the knee. Muscles and Tendons No focal intramuscular fluid collection or significant muscular edema. The patellar tendon and visualized Achilles tendon appear normal. Soft tissues There is extensive subcutaneous edema and ill-defined fluid medially, extending from just above the knee into the distal lower leg. Ill-defined fluid tracks between the medial head of the gastrocnemius muscle and the soleus muscle, measuring up to 6 mm in thickness. No focal fluid collection identified on noncontrast imaging. No evidence of foreign body or soft tissue emphysema. No gross vascular abnormality. IMPRESSION: 1. Extensive subcutaneous edema and ill-defined fluid medially in the right lower leg, extending from just above the knee into the distal lower leg. Findings are consistent with cellulitis and possible fasciitis. No focal fluid collection or intramuscular abnormality identified. 2. No evidence of osteomyelitis or septic joint at the right knee. Electronically Signed   By: Richardean Sale M.D.   On: 07/23/2019 11:11   US Venous Img Lower Right (DVT Study)  Result Date: 07/22/2019 CLINICAL DATA:  Right leg swelling EXAM: RIGHT LOWER EXTREMITY VENOUS DOPPLER ULTRASOUND TECHNIQUE: Gray-scale sonography with compression, as well as color and duplex ultrasound, were performed to evaluate the deep venous system(s) from the level of the common femoral vein through the popliteal and proximal calf veins. COMPARISON:  None. FINDINGS: VENOUS Normal compressibility of the common femoral, superficial femoral and popliteal veins. The calf veins are limited in visualization secondary to surrounding  soft tissue edema. Visualized portions of profunda femoral vein and great saphenous vein unremarkable. No filling defects to suggest DVT on grayscale or color Doppler imaging. Doppler waveforms show normal direction of venous flow, normal respiratory phasicity and response to augmentation. Limited views of the contralateral common femoral vein are unremarkable. OTHER None. Limitations: none IMPRESSION: Limited evaluation of the calf veins, as described above, without evidence of right lower extremity DVT. If clinical symptoms are inconsistent or if there are persistent or worsening symptoms, further imaging (possibly involving the iliac veins) may be warranted. Electronically Signed   By: Aram Candela M.D.   On: 07/22/2019 21:24   DG CHEST PORT 1 VIEW  Result Date: 07/23/2019 CLINICAL DATA:  Right knee infection, COVID positive EXAM: PORTABLE CHEST 1 VIEW COMPARISON:  01/05/2019 FINDINGS: The heart size and mediastinal contours are within normal limits. Both lungs are clear. The visualized skeletal structures are unremarkable. IMPRESSION: No active disease. Electronically Signed   By: Jasmine Pang M.D.   On: 07/23/2019 01:15   DG Knee Complete 4 Views Right  Result Date: 07/22/2019 CLINICAL DATA:  Leg swelling.  IV drug use. EXAM: RIGHT KNEE - COMPLETE 4+ VIEW COMPARISON:  None. FINDINGS: No bony abnormalities. No fracture or effusion. No bony lesions. Soft tissue edema identified. IMPRESSION: Soft tissue edema/swelling.  No other abnormalities. Electronically Signed   By: Gerome Sam III M.D   On: 07/22/2019 20:47     Labs:   Basic Metabolic Panel: Recent Labs  Lab 07/22/19 2109 07/22/19 2109 07/23/19 0505 07/23/19 0505 07/24/19 0354 07/24/19 0354 07/25/19 0446 07/25/19 0720 07/26/19 0453  NA 135  --  137  --  137  --   --  139 141  K 3.6   < > 3.9   < > 3.3*   < >  --  3.4* 3.7  CL 101  --  104  --  105  --   --  108 110  CO2 24  --  22  --  21*  --   --  21* 22  GLUCOSE  132*  --  124*  --  107*  --   --  129* 140*  BUN 16  --  17  --  21*  --   --  18 18  CREATININE 1.25*   < > 1.34*  --  1.22  --  1.01 1.03 1.00  CALCIUM 8.5*  --  7.9*  --  7.8*  --   --  8.3* 8.5*  MG  --   --   --   --  1.6*  --   --   --  1.7  PHOS  --   --   --   --  3.8  --   --   --  3.9   < > = values in this interval not displayed.   GFR Estimated Creatinine Clearance: 110.5 mL/min (by C-G formula based on SCr of 1 mg/dL). Liver Function Tests: Recent Labs  Lab 07/22/19 2109 07/25/19 0720  AST  33 16  ALT 32 16  ALKPHOS 76 79  BILITOT 0.7 0.5  PROT 7.2 6.3*  ALBUMIN 2.7* 2.1*   No results for input(s): LIPASE, AMYLASE in the last 168 hours. No results for input(s): AMMONIA in the last 168 hours. Coagulation profile Recent Labs  Lab 07/22/19 2109  INR 1.2    CBC: Recent Labs  Lab 07/22/19 2109 07/23/19 0505 07/24/19 0354 07/25/19 0720 07/26/19 0453  WBC 28.6* 27.3* 26.8* 27.8* 24.0*  NEUTROABS 23.7* 20.2*  --   --   --   HGB 11.8* 10.8* 10.6* 10.9* 12.0*  HCT 36.6* 33.9* 33.3* 34.2* 38.2*  MCV 89.5 90.6 89.0 87.7 90.7  PLT 286 259 339 417* 540*   Cardiac Enzymes: No results for input(s): CKTOTAL, CKMB, CKMBINDEX, TROPONINI in the last 168 hours. BNP: Invalid input(s): POCBNP CBG: No results for input(s): GLUCAP in the last 168 hours. D-Dimer No results for input(s): DDIMER in the last 72 hours. Hgb A1c No results for input(s): HGBA1C in the last 72 hours. Lipid Profile No results for input(s): CHOL, HDL, LDLCALC, TRIG, CHOLHDL, LDLDIRECT in the last 72 hours. Thyroid function studies No results for input(s): TSH, T4TOTAL, T3FREE, THYROIDAB in the last 72 hours.  Invalid input(s): FREET3 Anemia work up No results for input(s): VITAMINB12, FOLATE, FERRITIN, TIBC, IRON, RETICCTPCT in the last 72 hours. Microbiology Recent Results (from the past 240 hour(s))  Respiratory Panel by RT PCR (Flu A&B, Covid) - Nasopharyngeal Swab     Status: Abnormal    Collection Time: 07/22/19  9:09 PM   Specimen: Nasopharyngeal Swab  Result Value Ref Range Status   SARS Coronavirus 2 by RT PCR POSITIVE (A) NEGATIVE Final    Comment: RESULT CALLED TO, READ BACK BY AND VERIFIED WITH: MAYNARD,C AT 0022 ON [id number] BY CHERESNOWSKY,T (NOTE) SARS-CoV-2 target nucleic acids are DETECTED. SARS-CoV-2 RNA is generally detectable in upper respiratory specimens  during the acute phase of infection. Positive results are indicative of the presence of the identified virus, but do not rule out bacterial infection or co-infection with other pathogens not detected by the test. Clinical correlation with patient history and other diagnostic information is necessary to determine patient infection status. The expected result is Negative. Fact Sheet for Patients:  https://www.moore.com/https://www.fda.gov/media/142436/download Fact Sheet for Healthcare Providers: https://www.young.biz/https://www.fda.gov/media/142435/download This test is not yet approved or cleared by the [place] FDA and  has been authorized for detection and/or diagnosis of SARS-CoV-2 by FDA under an Emergency Use Authorization (EUA).  This EUA will remain in effect (meaning this test can  be used) for the duration of  the COVID-19 declaration under Section 564(b)(1) of the Act, 21 U.S.C. section 360bbb-3(b)(1), unless the authorization is terminated or revoked sooner.    Influenza A by PCR NEGATIVE NEGATIVE Final   Influenza B by PCR NEGATIVE NEGATIVE Final    Comment: (NOTE) The Xpert Xpress SARS-CoV-2/FLU/RSV assay is intended as an aid in  the diagnosis of influenza from Nasopharyngeal swab specimens and  should not be used as a sole basis for treatment. Nasal washings and  aspirates are unacceptable for Xpert Xpress SARS-CoV-2/FLU/RSV  testing. Fact Sheet for Patients: https://www.moore.com/https://www.fda.gov/media/142436/download Fact Sheet for Healthcare Providers: https://www.young.biz/https://www.fda.gov/media/142435/download This test is not yet approved or  cleared by the [place] FDA and  has been authorized for detection and/or diagnosis of SARS-CoV-2 by  FDA under an Emergency Use Authorization (EUA). This EUA will remain  in effect (meaning this test can be used) for the duration of the  Covid-19  declaration under Section 564(b)(1) of the Act, 21  U.S.C. section 360bbb-3(b)(1), unless the authorization is  terminated or revoked. Performed at [facility], [address] [place], [place] [address]   Culture, blood (routine x 2)     Status: None (Preliminary result)   Collection Time: 07/22/19  9:15 PM   Specimen: BLOOD LEFT ARM  Result Value Ref Range Status   Specimen Description   Final    BLOOD LEFT ARM Performed at [facility], [address] [place], [place] [address]    Special Requests   Final    BOTTLES DRAWN AEROBIC AND ANAEROBIC Blood Culture adequate volume Performed at [facility], [address] [place], [place] [address]    Culture   Final    NO GROWTH 3 DAYS Performed at [facility], [address] [place], [place] [address]    Report Status PENDING  Incomplete  Culture, blood (routine x 2)     Status: None (Preliminary result)   Collection Time: 07/22/19  9:25 PM   Specimen: BLOOD RIGHT ARM  Result Value Ref Range Status   Specimen Description   Final    BLOOD RIGHT ARM Performed at [facility], [address] [place], [place] [address]    Special Requests   Final    BOTTLES DRAWN AEROBIC AND ANAEROBIC Blood Culture results may not be optimal due to an inadequate volume of blood received in culture bottles Performed at [facility], [address] [place], [place] [address]    Culture   Final    NO GROWTH 3 DAYS Performed at [facility], [address] [place], [place] [address]    Report Status PENDING  Incomplete  Culture, blood (single)     Status: None (Preliminary result)   Collection Time: 07/22/19  9:30 PM    Specimen: BLOOD RIGHT HAND  Result Value Ref Range Status   Specimen Description   Final    BLOOD RIGHT HAND Performed at [facility], [address] [place], [place] [address]    Special Requests   Final    BOTTLES DRAWN AEROBIC AND ANAEROBIC Blood Culture adequate volume Performed at [facility], [address] [place], [place] [address]    Culture   Final    NO GROWTH 3 DAYS Performed at [facility], [address], [place], [place] [address]    Report Status PENDING  Incomplete     Discharge Instructions:   Discharge Instructions    Diet general   Complete by: As directed    Discharge instructions   Complete by: As directed    Complete the course of antibiotics.  Take Tylenol and Motrin for pain.  Please follow-up with your primary care physician in 1 week.  Lab to check your blood work at that time.  Seek medical attention for worsening symptoms.   Increase activity slowly   Complete by: As directed      Allergies as of 07/26/2019      Reactions   Bee Venom Swelling      Medication List    STOP taking these medications   diclofenac sodium 1 % Gel Commonly known as: VOLTAREN   lidocaine 5 % Commonly known as: Lidoderm     TAKE these medications   amLODipine 2.5 MG tablet Commonly known as: NORVASC Take 1 tablet (2.5 mg  total) by mouth daily.   amoxicillin-clavulanate 875-125 MG tablet Commonly known as: Augmentin Take 1 tablet by mouth 2 (two) times daily for 7 days.   cyclobenzaprine 5 MG tablet Commonly known as: FLEXERIL Take 1 tablet (5 mg total) by mouth 3 (three) times daily as needed for muscle spasms.   doxycycline 100 MG tablet Commonly known as: VIBRA-TABS Take 1 tablet (100 mg total) by mouth 2 (two) times daily for 7 days.   ibuprofen 200 MG tablet Commonly known as: ADVIL Take 3 tablets (600 mg total) by mouth every 6 (six) hours as needed for fever, headache, mild pain or moderate pain. What changed:    how much to take  Another medication with the same name was removed. Continue taking this medication, and follow the directions you see here.       Time coordinating discharge: 39 minutes  Signed:  Jahlen Bollman  [facility] 07/26/2019, 11:11 AM

## 2019-07-26 NOTE — TOC Progression Note (Signed)
Transition of Care [facility]) - Progression Note    Patient Details  Name: Grant Tapia MRN: [id number] Date of Birth: Jan 14, 1986  [facility] CM/SW Contact  Geni Bers, RN Phone Number: 07/26/2019, 12:55 PM  Clinical Narrative:    Pt asked for a bus ride. Can not ride the bus related to COVID. Checked with [facility] ride and pt may use this transportation. Charged to the floor.     Expected Discharge Plan: Home/Self Care Barriers to Discharge: Continued Medical Work up  Expected Discharge Plan and Services Expected Discharge Plan: Home/Self Care       Living arrangements for the past 2 months: Single Family Home Expected Discharge Date: 07/26/19               DME Arranged: N/A DME Agency: NA       HH Arranged: NA HH Agency: NA         Social Determinants of Health (SDOH) Interventions    Readmission Risk Interventions Readmission Risk Prevention Plan 04/18/2019  Transportation Screening Complete  PCP or Specialist Appt within 5-7 Days Complete  Home Care Screening Complete  Medication Review (RN CM) Referral to Pharmacy  Some recent data might be hidden

## 2019-07-27 LAB — CULTURE, BLOOD (ROUTINE X 2)
Culture: NO GROWTH
Culture: NO GROWTH
Special Requests: ADEQUATE

## 2019-07-27 LAB — CULTURE, BLOOD (SINGLE)
Culture: NO GROWTH
Special Requests: ADEQUATE

## 2019-07-29 ENCOUNTER — Emergency Department (HOSPITAL_COMMUNITY): Payer: Self-pay

## 2019-07-29 ENCOUNTER — Inpatient Hospital Stay (HOSPITAL_COMMUNITY): Payer: Self-pay

## 2019-07-29 ENCOUNTER — Inpatient Hospital Stay (HOSPITAL_COMMUNITY)
Admission: EM | Admit: 2019-07-29 | Discharge: 2019-08-03 | DRG: 871 | Disposition: A | Discharge status: Home / Self Care | Payer: Self-pay | Attending: Family Medicine | Admitting: Family Medicine

## 2019-07-29 ENCOUNTER — Encounter (HOSPITAL_COMMUNITY): Payer: Self-pay

## 2019-07-29 ENCOUNTER — Other Ambulatory Visit: Payer: Self-pay

## 2019-07-29 DIAGNOSIS — M7989 Other specified soft tissue disorders: Secondary | ICD-10-CM | POA: Diagnosis present

## 2019-07-29 DIAGNOSIS — M79609 Pain in unspecified limb: Secondary | ICD-10-CM

## 2019-07-29 DIAGNOSIS — I10 Essential (primary) hypertension: Secondary | ICD-10-CM | POA: Diagnosis present

## 2019-07-29 DIAGNOSIS — F1110 Opioid abuse, uncomplicated: Secondary | ICD-10-CM | POA: Diagnosis present

## 2019-07-29 DIAGNOSIS — A419 Sepsis, unspecified organism: Principal | ICD-10-CM | POA: Diagnosis present

## 2019-07-29 DIAGNOSIS — E876 Hypokalemia: Secondary | ICD-10-CM | POA: Diagnosis present

## 2019-07-29 DIAGNOSIS — S86119A Strain of other muscle(s) and tendon(s) of posterior muscle group at lower leg level, unspecified leg, initial encounter: Secondary | ICD-10-CM | POA: Diagnosis present

## 2019-07-29 DIAGNOSIS — Z87891 Personal history of nicotine dependence: Secondary | ICD-10-CM

## 2019-07-29 DIAGNOSIS — L0390 Cellulitis, unspecified: Secondary | ICD-10-CM

## 2019-07-29 DIAGNOSIS — Z79899 Other long term (current) drug therapy: Secondary | ICD-10-CM

## 2019-07-29 DIAGNOSIS — L0100 Impetigo, unspecified: Secondary | ICD-10-CM | POA: Diagnosis present

## 2019-07-29 DIAGNOSIS — L0211 Cutaneous abscess of neck: Secondary | ICD-10-CM | POA: Diagnosis present

## 2019-07-29 DIAGNOSIS — F1010 Alcohol abuse, uncomplicated: Secondary | ICD-10-CM | POA: Diagnosis present

## 2019-07-29 DIAGNOSIS — L03115 Cellulitis of right lower limb: Secondary | ICD-10-CM | POA: Diagnosis present

## 2019-07-29 DIAGNOSIS — F1410 Cocaine abuse, uncomplicated: Secondary | ICD-10-CM | POA: Diagnosis present

## 2019-07-29 DIAGNOSIS — F1990 Other psychoactive substance use, unspecified, uncomplicated: Secondary | ICD-10-CM | POA: Diagnosis present

## 2019-07-29 DIAGNOSIS — U071 COVID-19: Secondary | ICD-10-CM

## 2019-07-29 DIAGNOSIS — L03116 Cellulitis of left lower limb: Secondary | ICD-10-CM

## 2019-07-29 DIAGNOSIS — Z91030 Bee allergy status: Secondary | ICD-10-CM

## 2019-07-29 LAB — CBC WITH DIFFERENTIAL/PLATELET
Abs Immature Granulocytes: 0.33 10*3/uL — ABNORMAL HIGH (ref 0.00–0.07)
Basophils Absolute: 0.1 10*3/uL (ref 0.0–0.1)
Basophils Relative: 1 %
Eosinophils Absolute: 0.8 10*3/uL — ABNORMAL HIGH (ref 0.0–0.5)
Eosinophils Relative: 5 %
HCT: 38 % — ABNORMAL LOW (ref 39.0–52.0)
Hemoglobin: 12 g/dL — ABNORMAL LOW (ref 13.0–17.0)
Immature Granulocytes: 2 %
Lymphocytes Relative: 18 %
Lymphs Abs: 2.8 10*3/uL (ref 0.7–4.0)
MCH: 28.5 pg (ref 26.0–34.0)
MCHC: 31.6 g/dL (ref 30.0–36.0)
MCV: 90.3 fL (ref 80.0–100.0)
Monocytes Absolute: 0.7 10*3/uL (ref 0.1–1.0)
Monocytes Relative: 4 %
Neutro Abs: 11.1 10*3/uL — ABNORMAL HIGH (ref 1.7–7.7)
Neutrophils Relative %: 70 %
Platelets: 464 10*3/uL — ABNORMAL HIGH (ref 150–400)
RBC: 4.21 MIL/uL — ABNORMAL LOW (ref 4.22–5.81)
RDW: 14.6 % (ref 11.5–15.5)
WBC: 15.9 10*3/uL — ABNORMAL HIGH (ref 4.0–10.5)
nRBC: 0 % (ref 0.0–0.2)

## 2019-07-29 LAB — COMPREHENSIVE METABOLIC PANEL
ALT: 18 U/L (ref 0–44)
AST: 22 U/L (ref 15–41)
Albumin: 2.7 g/dL — ABNORMAL LOW (ref 3.5–5.0)
Alkaline Phosphatase: 54 U/L (ref 38–126)
Anion gap: 8 (ref 5–15)
BUN: 10 mg/dL (ref 6–20)
CO2: 27 mmol/L (ref 22–32)
Calcium: 8.5 mg/dL — ABNORMAL LOW (ref 8.9–10.3)
Chloride: 105 mmol/L (ref 98–111)
Creatinine, Ser: 0.79 mg/dL (ref 0.61–1.24)
GFR calc Af Amer: >60 mL/min (ref >60)
GFR calc non Af Amer: >60 mL/min (ref >60)
Glucose, Bld: 93 mg/dL (ref 70–99)
Potassium: 3 mmol/L — ABNORMAL LOW (ref 3.5–5.1)
Sodium: 140 mmol/L (ref 135–145)
Total Bilirubin: 0.4 mg/dL (ref 0.3–1.2)
Total Protein: 7.9 g/dL (ref 6.5–8.1)

## 2019-07-29 LAB — HEPATIC FUNCTION PANEL
ALT: 17 U/L (ref 0–44)
AST: 19 U/L (ref 15–41)
Albumin: 2.2 g/dL — ABNORMAL LOW (ref 3.5–5.0)
Alkaline Phosphatase: 46 U/L (ref 38–126)
Bilirubin, Direct: 0.1 mg/dL (ref 0.0–0.2)
Indirect Bilirubin: 0.3 mg/dL (ref 0.3–0.9)
Total Bilirubin: 0.4 mg/dL (ref 0.3–1.2)
Total Protein: 7 g/dL (ref 6.5–8.1)

## 2019-07-29 LAB — TYPE AND SCREEN
ABO/RH(D): O POS
Antibody Screen: NEGATIVE

## 2019-07-29 LAB — PROTIME-INR
INR: 1.1 (ref 0.8–1.2)
Prothrombin Time: 14.4 seconds (ref 11.4–15.2)

## 2019-07-29 LAB — PHOSPHORUS: Phosphorus: 4.5 mg/dL (ref 2.5–4.6)

## 2019-07-29 LAB — FERRITIN: Ferritin: 141 ng/mL (ref 24–336)

## 2019-07-29 LAB — LACTIC ACID, PLASMA
Lactic Acid, Venous: 1.7 mmol/L (ref 0.5–1.9)
Lactic Acid, Venous: 1.1 mmol/L (ref 0.5–1.9)

## 2019-07-29 LAB — C-REACTIVE PROTEIN: CRP: 6.9 mg/dL — ABNORMAL HIGH (ref <1.0)

## 2019-07-29 LAB — MAGNESIUM: Magnesium: 1.6 mg/dL — ABNORMAL LOW (ref 1.7–2.4)

## 2019-07-29 MED ORDER — BUPRENORPHINE HCL-NALOXONE HCL 2-0.5 MG SL SUBL
2.0000 | SUBLINGUAL_TABLET | SUBLINGUAL | Status: AC | PRN
  Filled 2019-07-29: qty 2

## 2019-07-29 MED ORDER — ONDANSETRON HCL 4 MG PO TABS: 4.0000 mg | ORAL_TABLET | Freq: Four times a day (QID) | ORAL | Status: DC | PRN

## 2019-07-29 MED ORDER — SODIUM CHLORIDE 0.9 % IV SOLN
2.0000 g | Freq: Once | INTRAVENOUS | Status: AC
  Administered 2019-07-29: 2 g via INTRAVENOUS
  Filled 2019-07-29: qty 20

## 2019-07-29 MED ORDER — POTASSIUM CHLORIDE 10 MEQ/100ML IV SOLN
10.0000 meq | INTRAVENOUS | Status: AC
  Administered 2019-07-29: 10 meq via INTRAVENOUS
  Filled 2019-07-29: qty 100

## 2019-07-29 MED ORDER — SODIUM CHLORIDE 0.9% FLUSH: 3.0000 mL | INTRAVENOUS | Status: DC | PRN

## 2019-07-29 MED ORDER — POTASSIUM CHLORIDE CRYS ER 20 MEQ PO TBCR
40.0000 meq | EXTENDED_RELEASE_TABLET | Freq: Once | ORAL | Status: AC
  Administered 2019-07-29: 40 meq via ORAL
  Filled 2019-07-29: qty 2

## 2019-07-29 MED ORDER — VANCOMYCIN HCL IN DEXTROSE 1-5 GM/200ML-% IV SOLN: 1000.0000 mg | Freq: Once | INTRAVENOUS | Status: DC

## 2019-07-29 MED ORDER — BUPRENORPHINE HCL-NALOXONE HCL 8-2 MG SL SUBL: 1.0000 | SUBLINGUAL_TABLET | Freq: Two times a day (BID) | SUBLINGUAL | Status: DC

## 2019-07-29 MED ORDER — SODIUM CHLORIDE 0.9% FLUSH
3.0000 mL | Freq: Two times a day (BID) | INTRAVENOUS | Status: DC
  Administered 2019-07-30 – 2019-08-01 (×4): 3 mL via INTRAVENOUS

## 2019-07-29 MED ORDER — SODIUM CHLORIDE 0.9 % IV SOLN
INTRAVENOUS | Status: DC | PRN
  Administered 2019-07-29 – 2019-08-03 (×2): 250 mL via INTRAVENOUS

## 2019-07-29 MED ORDER — ONDANSETRON HCL 4 MG/2ML IJ SOLN: 4.0000 mg | Freq: Four times a day (QID) | INTRAMUSCULAR | Status: DC | PRN

## 2019-07-29 MED ORDER — HYDROXYZINE HCL 10 MG PO TABS
10.0000 mg | ORAL_TABLET | Freq: Three times a day (TID) | ORAL | Status: DC | PRN
  Administered 2019-07-30 – 2019-08-02 (×2): 10 mg via ORAL
  Filled 2019-07-29 (×2): qty 1

## 2019-07-29 MED ORDER — SODIUM CHLORIDE 0.9 % IV SOLN: 250.0000 mL | INTRAVENOUS | Status: DC | PRN

## 2019-07-29 MED ORDER — LOPERAMIDE HCL 2 MG PO CAPS: 2.0000 mg | ORAL_CAPSULE | ORAL | Status: DC | PRN

## 2019-07-29 MED ORDER — TRAMADOL HCL 50 MG PO TABS
50.0000 mg | ORAL_TABLET | Freq: Two times a day (BID) | ORAL | Status: DC | PRN
  Administered 2019-07-29 – 2019-08-03 (×5): 50 mg via ORAL
  Filled 2019-07-29 (×5): qty 1

## 2019-07-29 MED ORDER — BUPRENORPHINE HCL-NALOXONE HCL 8-2 MG SL SUBL
1.0000 | SUBLINGUAL_TABLET | Freq: Two times a day (BID) | SUBLINGUAL | Status: DC
  Administered 2019-07-30 – 2019-08-03 (×10): 1 via SUBLINGUAL
  Filled 2019-07-29 (×11): qty 1

## 2019-07-29 MED ORDER — VANCOMYCIN HCL 1750 MG/350ML IV SOLN
1750.0000 mg | Freq: Once | INTRAVENOUS | Status: AC
  Administered 2019-07-29: 1750 mg via INTRAVENOUS
  Filled 2019-07-29: qty 350

## 2019-07-29 MED ORDER — IBUPROFEN 200 MG PO TABS
600.0000 mg | ORAL_TABLET | Freq: Four times a day (QID) | ORAL | Status: DC | PRN
  Administered 2019-07-30: 600 mg via ORAL
  Filled 2019-07-29: qty 3

## 2019-07-29 MED ORDER — METRONIDAZOLE IN NACL 5-0.79 MG/ML-% IV SOLN
500.0000 mg | Freq: Three times a day (TID) | INTRAVENOUS | Status: DC
  Administered 2019-07-29 – 2019-08-01 (×8): 500 mg via INTRAVENOUS
  Filled 2019-07-29 (×8): qty 100

## 2019-07-29 NOTE — ED Triage Notes (Signed)
Per EMS- patient was seen 1-2 weeks ago for right leg infection. Patient was given antibiotics and still has 2 days left to take. Patient states infection not getting any better. patient took Tylenol 2000 mg prior to arrival to the ED

## 2019-07-29 NOTE — Progress Notes (Signed)
VASCULAR LAB PRELIMINARY  PRELIMINARY  PRELIMINARY  PRELIMINARY  Right lower extremity venous duplex completed.    Preliminary report:  See CV proc for preliminary results.   Gave report to Arthor Captain, PA-C  [facility] Moise, RVT 07/29/2019, 6:31 PM

## 2019-07-29 NOTE — Progress Notes (Signed)
Pharmacy Antibiotic Note  Grant Tapia is a 34 y.o. male admitted on 07/29/2019 with cellulitis.  Pharmacy has been consulted for vancomycin dosing.  Plan: Vancomycin 1750 mg IV q12h for est AUC = 523 Goal AUC = 400-550 F/u scr/cultures/levels  Height: 5\' 10"  (177.8 cm) Weight: 77.1 kg (170 lb) IBW/kg (Calculated) : 73  Temp (24hrs), Avg:99 F (37.2 C), Min:98.7 F (37.1 C), Max:99.2 F (37.3 C)  Recent Labs  Lab 07/22/19 2350 07/23/19 0505 07/23/19 0505 07/24/19 0354 07/25/19 0446 07/25/19 0720 07/26/19 0453 07/29/19 1543  WBC  --  27.3*  --  26.8*  --  27.8* 24.0* 15.9*  CREATININE  --  1.34*   < > 1.22 1.01 1.03 1.00 0.79  LATICACIDVEN 2.5*  --   --   --   --   --   --  1.7   < > = values in this interval not displayed.    Estimated Creatinine Clearance: 138.1 mL/min (by C-G formula based on SCr of 0.79 mg/dL).    Allergies  Allergen Reactions  . Bee Venom Swelling    Antimicrobials this admission: 4/17 vancomycin >>  4/17 roc/flagyl >>   Dose adjustments this admission:   Microbiology results:  BCx:   UCx:    Sputum:    MRSA PCR:   Thank you for allowing pharmacy to be a part of this patient's care.  5/17 07/29/2019 11:13 PM

## 2019-07-29 NOTE — ED Provider Notes (Signed)
[facility]-EMERGENCY DEPT Provider Note   CSN: [id number] Arrival date & time: 07/29/19  1351     History Chief Complaint  Patient presents with  . Recurrent Skin Infections    Grant Tapia is a 34 y.o. male with a past medical history polysubstance abuse including alcohol and IV drug abuse with heroin and cocaine.  The patient was recently admitted for 4 days to the hospital for suspected knee infection.  Patient was treated in the hospital with vancomycin and Zosyn and was consulted on by orthopedics.  Ultimately this was determined to be cellulitis, he had negative blood cultures after 4 days, negative 2D echocardiogram of the heart and was ultimately discharged on Augmentin.  Patient states that he has been taking this as directed by mouth and is down to his last 2 pills.  He felt like his leg was doing better however about 2 days ago he began having significant swelling of the right lower extremity which has become much worse.  He complains of severe pain in the skin of his right lower extremity.  He states that it is tight and burning.  He has swelling all the way up to his inguinal and right buttock.  He has noticed swelling and be lymph nodes of the right groin.  He denies fevers, chills, chest pain or shortness of breath.  HPI     Past Medical History:  Diagnosis Date  . Heroin abuse (HCC)   . Hypertension   . IV drug user   . Neck abscess 04/2019  . Overdose   . Polysubstance abuse [facility])     Patient Active Problem List   Diagnosis Date Noted  . Right knee skin infection 07/23/2019  . COVID-19 virus infection 07/23/2019  . Infection of right knee (HCC) 07/22/2019  . HTN (hypertension), benign 04/15/2019  . Poisoning by heroin, accidental (unintentional), initial encounter (HCC) 05/08/2017  . Heroin dependence (HCC) 05/08/2017  . Back pain 12/30/2016  . Abnormal LFTs 12/30/2016  . Polysubstance abuse (HCC)   . Cellulitis 11/19/2016  .  IV drug user 11/19/2016  . Neck abscess 11/19/2016  . Delirium   . Tachycardia   . Overdose 07/18/2016  . Drug withdrawal (HCC) 07/16/2016  . AKI (acute kidney injury) (HCC) 07/15/2016  . Alcohol abuse 07/15/2016  . Heroin abuse (HCC) 07/15/2016  . 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 Cocaine abuse (HCC) 07/15/2016  . Acute metabolic encephalopathy 07/15/2016  . Lactic acidosis 03/29/2014  . Severe sepsis (HCC) 03/27/2014  . Chest pain   . Headache   . Neck pain     Past Surgical History:  Procedure Laterality Date  . I & D EXTREMITY Left 11/19/2016   Procedure: INCISION AND DRAINAGE LEFT ELBOW  ABSCESS;  Surgeon: Myrene Galas, MD;  Location: [facility] OR;  Service: Orthopedics;  Laterality: Left;  . INCISION AND DRAINAGE ABSCESS Left 04/17/2019   Procedure: INCISION AND DRAINAGE NECK ABSCESS;  Surgeon: [name], MD;  Location: [facility] OR;  Service: ENT;  Laterality: Left;       Family History  Problem Relation Age of Onset  . Healthy Mother   . Healthy Father     Social History   Tobacco Use  . Smoking status: Former Smoker    Quit date: 12/12/2017    Years since quitting: 1.6  . Smokeless tobacco: Never Used  Substance Use Topics  . Alcohol use: Yes    Comment: occasional wine  . Drug use: Yes  Types: IV, Cocaine    Comment: heroin    Home Medications Prior to Admission medications   Medication Sig Start Date End Date Taking? Authorizing Provider  amLODipine (NORVASC) 2.5 MG tablet Take 1 tablet (2.5 mg total) by mouth daily. 07/26/19 08/25/19  Pokhrel, Rebekah Chesterfield, MD  amoxicillin-clavulanate (AUGMENTIN) 875-125 MG tablet Take 1 tablet by mouth 2 (two) times daily for 7 days. 07/26/19 08/02/19  Pokhrel, Rebekah Chesterfield, MD  cyclobenzaprine (FLEXERIL) 5 MG tablet Take 1 tablet (5 mg total) by mouth 3 (three) times daily as needed for muscle spasms. 07/26/19   Pokhrel, Rebekah Chesterfield, MD  doxycycline (VIBRA-TABS) 100 MG tablet Take 1 tablet (100 mg total) by mouth 2 (two) times daily for 7  days. 07/26/19 08/02/19  Pokhrel, Rebekah Chesterfield, MD  ibuprofen (ADVIL) 200 MG tablet Take 3 tablets (600 mg total) by mouth every 6 (six) hours as needed for fever, headache, mild pain or moderate pain. 07/26/19   Pokhrel, Rebekah Chesterfield, MD    Allergies    Bee venom  Review of Systems   Review of Systems Ten systems reviewed and are negative for acute change, except as noted in the HPI.   Physical Exam Updated Vital Signs BP (!) 147/102 (BP Location: Left Arm)   Pulse (!) 106   Temp 98.7 F (37.1 C) (Oral)   Resp 18   Ht 5\' 10"  (1.778 m)   Wt 77.1 kg   SpO2 100%   BMI 24.39 kg/m   Physical Exam Vitals and nursing note reviewed.  Constitutional:      General: He is not in acute distress.    Appearance: He is well-developed. He is not ill-appearing, toxic-appearing or diaphoretic.  HENT:     Head: Normocephalic and atraumatic.  Eyes:     General: No scleral icterus.    Conjunctiva/sclera: Conjunctivae normal.  Cardiovascular:     Rate and Rhythm: Normal rate and regular rhythm.     Heart sounds: Normal heart sounds.     Comments:   Multiple track marks on the bilateral arms consistent with history of IV drug abuse Pulmonary:     Effort: Pulmonary effort is normal. No respiratory distress.     Breath sounds: Normal breath sounds.  Abdominal:     Palpations: Abdomen is soft.     Tenderness: There is no abdominal tenderness.  Musculoskeletal:     Cervical back: Normal range of motion and neck supple.     Right lower leg: Edema present.     Comments: Right lower extremity with impressive edema.  The right calf area is extremely swollen, tight, erythematous and shiny.  Right foot is neurovascularly intact.  He has 3+ pitting edema up to the thigh.  Right inguinal lymph nodes are markedly swollen and tender compared to the left.  Compartment is soft in the calf  Skin:    General: Skin is warm and dry.  Neurological:     Mental Status: He is alert.  Psychiatric:        Behavior: Behavior  normal.           ED Results / Procedures / Treatments   Labs (all labs ordered are listed, but only abnormal results are displayed) Labs Reviewed  COMPREHENSIVE METABOLIC PANEL - Abnormal; Notable for the following components:      Result Value   Potassium 3.0 (*)    Calcium 8.5 (*)    Albumin 2.7 (*)    All other components within normal limits  CBC WITH DIFFERENTIAL/PLATELET - Abnormal; Notable  for the following components:   WBC 15.9 (*)    RBC 4.21 (*)    Hemoglobin 12.0 (*)    HCT 38.0 (*)    Platelets 464 (*)    Neutro Abs 11.1 (*)    Eosinophils Absolute 0.8 (*)    Abs Immature Granulocytes 0.33 (*)    All other components within normal limits  C-REACTIVE PROTEIN - Abnormal; Notable for the following components:   CRP 6.9 (*)    All other components within normal limits  D-DIMER, QUANTITATIVE (NOT AT [facility]) - Abnormal; Notable for the following components:   D-Dimer, Quant 2.15 (*)    All other components within normal limits  LACTATE DEHYDROGENASE - Abnormal; Notable for the following components:   LDH 94 (*)    All other components within normal limits  HEPATIC FUNCTION PANEL - Abnormal; Notable for the following components:   Albumin 2.2 (*)    All other components within normal limits  MAGNESIUM - Abnormal; Notable for the following components:   Magnesium 1.6 (*)    All other components within normal limits  MAGNESIUM - Abnormal; Notable for the following components:   Magnesium 1.6 (*)    All other components within normal limits  CBC WITH DIFFERENTIAL/PLATELET - Abnormal; Notable for the following components:   WBC 11.0 (*)    RBC 3.89 (*)    Hemoglobin 10.8 (*)    HCT 34.0 (*)    Platelets 482 (*)    Abs Immature Granulocytes 0.20 (*)    All other components within normal limits  COMPREHENSIVE METABOLIC PANEL - Abnormal; Notable for the following components:   Glucose, Bld 112 (*)    Calcium 8.4 (*)    Albumin 2.2 (*)    All other components  within normal limits  RAPID URINE DRUG SCREEN, HOSP PERFORMED - Abnormal; Notable for the following components:   Cocaine POSITIVE (*)    All other components within normal limits  PREALBUMIN - Abnormal; Notable for the following components:   Prealbumin 12.3 (*)    All other components within normal limits  CULTURE, BLOOD (ROUTINE X 2)  CULTURE, BLOOD (ROUTINE X 2)  LACTIC ACID, PLASMA  LACTIC ACID, PLASMA  PROTIME-INR  FERRITIN  FIBRINOGEN  PROCALCITONIN  HEPATITIS B SURFACE ANTIGEN  PHOSPHORUS  PHOSPHORUS  TSH  HIV ANTIBODY (ROUTINE TESTING W REFLEX)  HCV AB W REFLEX TO QUANT PCR  MAGNESIUM  CBC  TYPE AND SCREEN  ABO/RH  GC/CHLAMYDIA PROBE AMP ([place]) NOT AT [facility]    EKG None  Radiology No results found.  Procedures Procedures (including critical care time)  Medications Ordered in ED Medications  vancomycin (VANCOCIN) IVPB 1000 mg/200 mL premix (has no administration in time range)  cefTRIAXone (ROCEPHIN) 2 g in sodium chloride 0.9 % 100 mL IVPB (has no administration in time range)    ED Course  I have reviewed the triage vital signs and the nursing notes.  Pertinent labs & imaging results that were available during my care of the patient were reviewed by me and considered in my medical decision making (see chart for details).  Clinical Course as of Jul 30 10  Sat Jul 29, 2019  1753 Abs Immature Granulocytes(!): 0.33 [AH]  1753 Potassium(!): 3.0 [AH]    Clinical Course User Index [AH] Arthor Captain, PA-C   MDM Rules/Calculators/A&P                      This patient complains of Leg  pain, this involves an extensive number of treatment options, and is a complaint that carries with it a high risk of complications and morbidity.  The differential diagnosis includes cellulitis, necrotizing fasciitis, DVT  I Ordered, reviewed, and interpreted labs, which included blood cultures, lactic acid she was present with normal limits, CBC shows elevated  white blood Cell count which is improving from his previous admission.  CMP shows low potassium level which I repleted orally I ordered medication which include vancomycin and Rocephin as well as potassium for repletion I ordered imaging studies which included vascular ultrasound of the lower extremity and I independently visualized and interpreted imaging which showed no evidence of DVT, significant lymphadenopathy in the groin  Previous records obtained and reviewed  I consulted Dr. Lorin Mercy  Who will see tha p[atient in the morning. I discussed the case with dr. Roel Cluck who will admit the patient and discussed lab and imaging findings    Final Clinical Impression(s) / ED Diagnoses Final diagnoses:  Cellulitis of left lower extremity    Rx / DC Orders ED Discharge Orders    None       Margarita Mail, PA-C 07/31/19 0044    Dorie Rank, MD 07/31/19 1043

## 2019-07-29 NOTE — Plan of Care (Signed)
Discussed with the patient plan of care for the evening, pain management, cellulitis of right leg with some teach back displayed.  MD notified for extra pain medications; see new orders.

## 2019-07-29 NOTE — H&P (Signed)
Grant Tapia ENM:[id number] DOB: 1985/10/12 DOA: 07/29/2019     PCP: Patient, No Pcp Per   Outpatient Specialists: NONE    Patient arrived to ER on 07/29/19 at 1351  Patient coming from: home Lives alone,     Chief Complaint:   Chief Complaint  Patient presents with  . Recurrent Skin Infections    HPI: Grant Tapia is a 34 y.o. male with medical history significant of COVID positive 07/22/2019, IV drug use recent cellulitis of Right leg    Presented with   severe swelling of his right leg going over all the way up to his groin is been going on for past 2 days resulting in severe pain  Presented with leg celluliits Was recently admitted for knee infection from 4/10  - 07/26/19  he was discharged few days ago his leg gotten worse with increased swelling He as been taking his Augmentin not doxycyclin  Reports drinking only occasional and smokes only when using Uses about 1 gm of Heroin a day Reports interested in quitting   On his prior admission patient incidentally was found to be Covid positive although he denied any respiratory symptoms at the time he was febrile up to 200 which felt to be more likely secondary to his cellulitis. He CRP was elevated 42.5 lactic acid was elevated initially as well Patient initially was treated with vancomycin and Zosyn orthopedic surgery consulted recommended MRI at that time He have had 2D echo recently on 2 January which showed normal LV function had blood cultures which were negative. MRI done prior to admission showed only subcutaneous edema ill-defined fluid in the right lower leg consistent with first cellulitis and possible fasciitis but they did not find any focal fluid collection or intravascular normality. No evidence of osteomyelitis or septic joint noted.  Infectious risk factors:  Reports none   KNOWN COVID POSITIVE      Lab Results  Component Value Date   SARSCOV2NAA POSITIVE (A) 07/22/2019   SARSCOV2NAA NEGATIVE 04/15/2019   SARSCOV2NAA NEGATIVE 04/15/2019   SARSCOV2NAA NEGATIVE 01/08/2019     Regarding pertinent Chronic problems: IV drug use and recent admission for cellulitis of the right leg now worsening despite outpatient treatment   While in ER: Doppler was negative for DVT started on vancomycin and ceftriaxon Exam consistent for severe swelling of his right lower extremity  ER Provider Called:  Orthopedics   Dr.Yetes They Recommend admit to medicine  Continue IV antibiotics Will see in AM   Hospitalist was called for admission for cellulitis  The following Work up has been ordered so far:  Orders Placed This Encounter  Procedures  . Blood culture (routine x 2)  . Comprehensive metabolic panel  . CBC with Differential  . Lactic acid, plasma  . Protime-INR  . Cardiac monitoring  . Insert peripheral IV  . VAS [place] LOWER EXTREMITY VENOUS (DVT)    Following Medications were ordered in ER: Medications  vancomycin (VANCOREADY) IVPB 1750 mg/350 mL (1,750 mg Intravenous New Bag/Given 07/29/19 1840)  cefTRIAXone (ROCEPHIN) 2 g in sodium chloride 0.9 % 100 mL IVPB (0 g Intravenous Stopped 07/29/19 1841)  potassium chloride SA (KLOR-CON) CR tablet 40 mEq (40 mEq Oral Given 07/29/19 1838)      Significant initial  Findings: Abnormal Labs Reviewed  COMPREHENSIVE METABOLIC PANEL - Abnormal; Notable for the following components:      Result Value   Potassium 3.0 (*)    Calcium 8.5 (*)    Albumin  2.7 (*)    All other components within normal limits  CBC WITH DIFFERENTIAL/PLATELET - Abnormal; Notable for the following components:   WBC 15.9 (*)    RBC 4.21 (*)    Hemoglobin 12.0 (*)    HCT 38.0 (*)    Platelets 464 (*)    Neutro Abs 11.1 (*)    Eosinophils Absolute 0.8 (*)    Abs Immature Granulocytes 0.33 (*)    All other components within normal limits    Otherwise labs showing:    Recent Labs  Lab 07/23/19 0505 07/23/19 0505 07/24/19 0354 07/25/19 0446  07/25/19 0720 07/26/19 0453 07/29/19 1543  NA 137  --  137  --  139 141 140  K 3.9  --  3.3*  --  3.4* 3.7 3.0*  CO2 22  --  21*  --  21* 22 27  GLUCOSE 124*  --  107*  --  129* 140* 93  BUN 17  --  21*  --  CREATININE 1.34*   < > 1.22 1.01 1.03 1.00 0.79  CALCIUM 7.9*  --  7.8*  --  8.3* 8.5* 8.5*  MG  --   --  1.6*  --   --  1.7  --   PHOS  --   --  3.8  --   --  3.9  --    < > = values in this interval not displayed.    Cr   Stable,  Lab Results  Component Value Date   CREATININE 0.79 07/29/2019   CREATININE 1.00 07/26/2019   CREATININE 1.03 07/25/2019    Recent Labs  Lab 07/22/19 2109 07/25/19 0720 07/29/19 1543  AST 33 16 22  ALT 32 16 18  ALKPHOS 76 79 54  BILITOT 0.7 0.5 0.4  PROT 7.2 6.3* 7.9  ALBUMIN 2.7* 2.1* 2.7*   Lab Results  Component Value Date   CALCIUM 8.5 (L) 07/29/2019   PHOS 3.9 07/26/2019      WBC      Component Value Date/Time   WBC 15.9 (H) 07/29/2019 1543   ANC    Component Value Date/Time   NEUTROABS 11.1 (H) 07/29/2019 1543   ALC No components found for: LYMPHAB    Plt: Lab Results  Component Value Date   PLT 464 (H) 07/29/2019    Lactic Acid, Venous    Component Value Date/Time   LATICACIDVEN 1.7 07/29/2019 1543   Procalcitonin  Ordered   COVID-19 Labs  No results for input(s): DDIMER, FERRITIN, LDH, CRP in the last 72 hours.  Lab Results  Component Value Date   SARSCOV2NAA POSITIVE (A) 07/22/2019   SARSCOV2NAA NEGATIVE 04/15/2019   SARSCOV2NAA NEGATIVE 04/15/2019   SARSCOV2NAA NEGATIVE 01/08/2019     HG/HCT   Stable,     Component Value Date/Time   HGB 12.0 (L) 07/29/2019 1543   HCT 38.0 (L) 07/29/2019 1543      ECG: Ordered Personally reviewed by me showing: HR :  86 Rhythm:  NSR,    no evidence of ischemic changes [id number]    UA  not ordered   Ordered    CXR - mild cardiomegaly       ED Triage Vitals  Enc Vitals Group     BP 07/29/19 1356 (!) 147/102     Pulse Rate 07/29/19 1356  97     Resp 07/29/19 1356 18     Temp 07/29/19 1356 98.7 F (37.1 C)     Temp  Source 07/29/19 1356 Oral     SpO2 07/29/19 1356 100 %     Weight 07/29/19 1453 170 lb (77.1 kg)     Height 07/29/19 1453 5\' 10"  (1.778 m)     Head Circumference --      Peak Flow --      Pain Score 07/29/19 1453 7     Pain Loc --      Pain Edu? --      Excl. in [place]? --   TMAX(24)@       Latest  Blood pressure (!) 141/95, pulse 87, temperature 98.7 F (37.1 C), temperature source Oral, resp. rate 16, height 5\' 10"  (1.778 m), weight 77.1 kg, SpO2 100 %.     Review of Systems:    Pertinent positives include:  chills, fatigue leg pain and swelling  Constitutional:  No weight loss, night sweats, Fevers, , weight loss  HEENT:  No headaches, Difficulty swallowing,Tooth/dental problems,Sore throat,  No sneezing, itching, ear ache, nasal congestion, post nasal drip,  Cardio-vascular:  No chest pain, Orthopnea, PND, anasarca, dizziness, palpitations.no Bilateral lower extremity swelling  GI:  No heartburn, indigestion, abdominal pain, nausea, vomiting, diarrhea, change in bowel habits, loss of appetite, melena, blood in stool, hematemesis Resp:  no shortness of breath at rest. No dyspnea on exertion, No excess mucus, no productive cough, No non-productive cough, No coughing up of blood.No change in color of mucus.No wheezing. Skin:  no rash or lesions. No jaundice GU:  no dysuria, change in color of urine, no urgency or frequency. No straining to urinate.  No flank pain.  Musculoskeletal:  No joint pain or no joint swelling. No decreased range of motion. No back pain.  Psych:  No change in mood or affect. No depression or anxiety. No memory loss.  Neuro: no localizing neurological complaints, no tingling, no weakness, no double vision, no gait abnormality, no slurred speech, no confusion  All systems reviewed and apart from [place] all are negative  Past Medical History:   Past Medical History:    Diagnosis Date  . Heroin abuse ([place])   . Hypertension   . IV drug user   . Neck abscess 04/2019  . Overdose   . Polysubstance abuse [facility])       Past Surgical History:  Procedure Laterality Date  . I & D EXTREMITY Left 11/19/2016   Procedure: INCISION AND DRAINAGE LEFT ELBOW  ABSCESS;  Surgeon: Altamese Milo, MD;  Location: [facility];  Service: Orthopedics;  Laterality: Left;  . INCISION AND DRAINAGE ABSCESS Left 04/17/2019   Procedure: INCISION AND DRAINAGE NECK ABSCESS;  Surgeon: Melissa Montane, MD;  Location: [facility];  Service: ENT;  Laterality: Left;    Social History:  Ambulatory  independently      reports that he quit smoking about 19 months ago. He has never used smokeless tobacco. He reports current alcohol use. He reports current drug use. Drugs: IV and Cocaine.   Family History:   Family History  Problem Relation Age of Onset  . Healthy Mother   . Healthy Father     Allergies: Allergies  Allergen Reactions  . Bee Venom Swelling     Prior to Admission medications   Medication Sig Start Date End Date Taking? Authorizing Provider  amLODipine (NORVASC) 2.5 MG tablet Take 1 tablet (2.5 mg total) by mouth daily. 07/26/19 08/25/19  Pokhrel, Corrie Mckusick, MD  amoxicillin-clavulanate (AUGMENTIN) 875-125 MG tablet Take 1 tablet by mouth 2 (two) times daily for 7 days. 07/26/19  08/02/19  Pokhrel, Rebekah Chesterfield, MD  cyclobenzaprine (FLEXERIL) 5 MG tablet Take 1 tablet (5 mg total) by mouth 3 (three) times daily as needed for muscle spasms. 07/26/19   Pokhrel, Rebekah Chesterfield, MD  doxycycline (VIBRA-TABS) 100 MG tablet Take 1 tablet (100 mg total) by mouth 2 (two) times daily for 7 days. 07/26/19 08/02/19  Pokhrel, Rebekah Chesterfield, MD  ibuprofen (ADVIL) 200 MG tablet Take 3 tablets (600 mg total) by mouth every 6 (six) hours as needed for fever, headache, mild pain or moderate pain. 07/26/19   Pokhrel, Rebekah Chesterfield, MD   Physical Exam: Blood pressure (!) 141/95, pulse 87, temperature 98.7 F (37.1 C), temperature source  Oral, resp. rate 16, height  (1.778 m), weight 77.1 kg, SpO2 100 %. 1. General:  in No  Acute distress   Chronically ill  -appearing 2. Psychological: Alert and  Oriented 3. Head/ENT:    moist Mucous Membranes                          Head Non traumatic, neck supple                           Poor Dentition 4. SKIN:   decreased Skin turgor,  Skin clean Dry and intact no rash multiple scars    5. Heart: Regular rate and rhythm no Murmur, no Rub or gallop 6. Lungs:  no wheezes or crackles   7. Abdomen: Soft,  non-tender, Non distended bowel sounds present 8. Lower extremities: no clubbing, cyanosis,R>Ledema 9. Neurologically Grossly intact, moving all 4 extremities equally  10. MSK: Normal range of motion   All other LABS:     Recent Labs  Lab 07/22/19 2109 07/22/19 2109 07/23/19 0505 07/24/19 0354 07/25/19 0720 07/26/19 0453 07/29/19 1543  WBC 28.6*   < > 27.3* 26.8* 27.8* 24.0* 15.9*  NEUTROABS 23.7*  --  20.2*  --   --   --  11.1*  HGB 11.8*   < > 10.8* 10.6* 10.9* 12.0* 12.0*  HCT 36.6*   < > 33.9* 33.3* 34.2* 38.2* 38.0*  MCV 89.5   < > 90.6 89.0 87.7 90.7 90.3  PLT 286   < > 259 339 417* 540* 464*   < > = values in this interval not displayed.     Recent Labs  Lab 07/23/19 0505 07/23/19 0505 07/24/19 0354 07/25/19 0446 07/25/19 0720 07/26/19 0453 07/29/19 1543  NA 137  --  137  --  139 141 140  K 3.9  --  3.3*  --  3.4* 3.7 3.0*  CL 104  --  105  --  108 110 105  CO2 22  --  21*  --  21* 22 27  GLUCOSE 124*  --  107*  --  129* 140* 93  BUN 17  --  21*  --  CREATININE 1.34*   < > 1.22 1.01 1.03 1.00 0.79  CALCIUM 7.9*  --  7.8*  --  8.3* 8.5* 8.5*  MG  --   --  1.6*  --   --  1.7  --   PHOS  --   --  3.8  --   --  3.9  --    < > = values in this interval not displayed.     Recent Labs  Lab 07/22/19 2109 07/25/19 0720 07/29/19 1543  AST 33 16 22  ALT 32 16 18  ALKPHOS 76 79 54  BILITOT 0.7 0.5 0.4  PROT 7.2 6.3* 7.9  ALBUMIN 2.7*  2.1* 2.7*      Cultures:    Component Value Date/Time   SDES  07/22/2019 2130    BLOOD RIGHT HAND Performed at [facility], [address] [place], [place] [address]    [facility]  07/22/2019 2130    BOTTLES DRAWN AEROBIC AND ANAEROBIC Blood Culture adequate volume Performed at [facility], [address], [place], [place] [address]    CULT  07/22/2019 2130    NO GROWTH 5 DAYS Performed at [facility], [address] [place], [place] [address]    REPTSTATUS 07/27/2019 FINAL 07/22/2019 2130     Radiological Exams on Admission: VAS [place] LOWER EXTREMITY VENOUS (DVT)  Result Date: 07/29/2019  Lower Venous DVTStudy Indications: Pain, Swelling, Edema, Erythema, and Recent diagnosis of Covid and of cellulitis, failed antibiotics.  Comparison Study: Prior study from 07/22/19 is available for comparison Performing Technologist: Sherren Kerns RVS  Examination Guidelines: A complete evaluation includes B-mode imaging, spectral Doppler, color Doppler, and power Doppler as needed of all accessible portions of each vessel. Bilateral testing is considered an integral part of a complete examination. Limited examinations for reoccurring indications may be performed as noted. The reflux portion of the exam is performed with the patient in reverse Trendelenburg.  +---------+---------------+---------+-----------+----------+-------------------+ RIGHT    CompressibilityPhasicitySpontaneityPropertiesThrombus Aging      +---------+---------------+---------+-----------+----------+-------------------+ CFV      Full           Yes      No                                       +---------+---------------+---------+-----------+----------+-------------------+ SFJ      Full                    Yes                                      +---------+---------------+---------+-----------+----------+-------------------+ FV Prox  Full                                                              +---------+---------------+---------+-----------+----------+-------------------+ FV Mid                                                patent by color and                                                       Doppler             +---------+---------------+---------+-----------+----------+-------------------+ FV Distal  patent by color and                                                       Doppler             +---------+---------------+---------+-----------+----------+-------------------+ PFV                                                   patent by color and                                                       Doppler             +---------+---------------+---------+-----------+----------+-------------------+ POP      Full           Yes      Yes                                      +---------+---------------+---------+-----------+----------+-------------------+ PTV      Full                                                             +---------+---------------+---------+-----------+----------+-------------------+ PERO     Full                                                             +---------+---------------+---------+-----------+----------+-------------------+ Interstitial fluid noted throughout  +----+---------------+---------+-----------+----------+--------------+ LEFTCompressibilityPhasicitySpontaneityPropertiesThrombus Aging +----+---------------+---------+-----------+----------+--------------+ CFV Full           Yes      Yes                                 +----+---------------+---------+-----------+----------+--------------+     Summary: RIGHT: - There is no evidence of deep vein thrombosis in the lower extremity.  - Ultrasound characteristics of enlarged lymph nodes are noted in the groin.  LEFT: - No evidence of common femoral vein obstruction. - Ultrasound  characteristics of enlarged lymph nodes noted in the groin.  *See table(s) above for measurements and observations.    Preliminary     Chart has been reviewed    Assessment/Plan  34 y.o. male with medical history significant of COVID positive 07/22/2019, IV drug use recent cellulitis of Right leg Admitted for reccurent cellulitis failure of outpatient patient treatment  Present on Admission: . Cellulitis of right leg -severe with severe swelling of the leg.  Restart broad-spectrum antibiotics vancomycin Rocephin and metronidazole  patient only took Augmentin at home did not take doxycycline.  Prior blood cultures unremarkable.  Orthopedics aware Keep n.p.o. post midnight in case need surgical intervention Patient had recent MRI orthopedics at this point wanted to hold off on repeat imaging.  Will need to reassess in a.m.   . IV drug user -we will initiate Suboxone protocol patient is interested in discontinuation heroin abuse  . Heroin abuse (HCC) Suboxone protocol probably would benefit from follow-up and establishment of care as an outpatient   . COVID-19 virus infection -at this point continues to be symptomatic continue to keep on airborne precautions and monitor.  Marland Kitchen HTN (hypertension), benign -allow permissive hypertension for tonight given an infectious process resume home medications if no evidence of septic shock   Hypokalemia - will replace   Other plan as per orders.  DVT prophylaxis:  SCD    Code Status:  FULL CODE  as per patient   I had personally discussed CODE STATUS with patient    Family Communication:   Family not at  Bedside   Disposition Plan:    To home once workup is complete and patient is stable   Following barriers for discharge:                            Electrolytes corrected                             Pain controlled with PO medications                               Afebrile, white count improving able to transition to PO antibiotics                              Will need to be able to tolerate PO                                                       Will need consultants to evaluate patient prior to discharge                  Consults called: orthopedics surgery  Admission status:  ED Disposition    ED Disposition Condition Comment   Admit  The patient appears reasonably stabilized for admission considering the current resources, flow, and capabilities available in the ED at this time, and I doubt any other [facility] requiring further screening and/or treatment in the ED prior to admission is  present.        inpatient     I Expect 2 midnight stay secondary to severity of patient's current illness need for inpatient interventions justified by the following:  Severe lab/radiological/exam abnormalities including:    severe cellulitis hypokalemia  and extensive comorbidities including:  substance abuse  .    That are currently affecting medical management.   I expect  patient to be hospitalized for 2 midnights requiring inpatient medical care.  Patient is at high risk for adverse outcome (such as loss of life or disability) if not treated.  Indication for inpatient stay as follows:    severe pain requiring acute inpatient management,  inability to maintain oral hydration    Need for operative/procedural  intervention  Need for IV antibiotics,      Level of care     tele  For  24H      Precautions: admitted as   covid positive Airborne and Contact precautions   PPE: Used by the provider:   P100  eye Goggles,  Gloves  gown    Grant Tapia 07/29/2019, 11:11 PM    [facility]     after 2 AM please page floor coverage PA If 7AM-7PM, please contact the day team taking care of the patient using [email or web address]   Patient was evaluated in the context of the global COVID-19 pandemic, which necessitated consideration that the patient might be at risk for infection with the SARS-CoV-2 virus that causes  COVID-19. Institutional protocols and algorithms that pertain to the evaluation of patients at risk for COVID-19 are in a state of rapid change based on information released by regulatory bodies including the CDC and federal and state organizations. These policies and algorithms were followed during the patient's care.

## 2019-07-29 NOTE — Progress Notes (Signed)
A consult was received from an ED physician for vancomycin per pharmacy dosing.  The patient's profile has been reviewed for ht/wt/allergies/indication/available labs.   A one time order has been placed for vancomycin 1750 mg iv once.  Further antibiotics/pharmacy consults should be ordered by admitting physician if indicated.                       Thank you, Valentina Gu 07/29/2019  4:26 PM

## 2019-07-30 ENCOUNTER — Inpatient Hospital Stay (HOSPITAL_COMMUNITY): Payer: Self-pay

## 2019-07-30 DIAGNOSIS — F1110 Opioid abuse, uncomplicated: Secondary | ICD-10-CM

## 2019-07-30 DIAGNOSIS — R509 Fever, unspecified: Secondary | ICD-10-CM

## 2019-07-30 DIAGNOSIS — I517 Cardiomegaly: Secondary | ICD-10-CM

## 2019-07-30 DIAGNOSIS — L0390 Cellulitis, unspecified: Secondary | ICD-10-CM

## 2019-07-30 DIAGNOSIS — L03116 Cellulitis of left lower limb: Secondary | ICD-10-CM

## 2019-07-30 DIAGNOSIS — A419 Sepsis, unspecified organism: Secondary | ICD-10-CM | POA: Diagnosis present

## 2019-07-30 DIAGNOSIS — U071 COVID-19: Secondary | ICD-10-CM

## 2019-07-30 DIAGNOSIS — I10 Essential (primary) hypertension: Secondary | ICD-10-CM

## 2019-07-30 LAB — CBC WITH DIFFERENTIAL/PLATELET
Abs Immature Granulocytes: 0.2 10*3/uL — ABNORMAL HIGH (ref 0.00–0.07)
Basophils Absolute: 0.1 10*3/uL (ref 0.0–0.1)
Basophils Relative: 1 %
Eosinophils Absolute: 0.5 10*3/uL (ref 0.0–0.5)
Eosinophils Relative: 5 %
HCT: 34 % — ABNORMAL LOW (ref 39.0–52.0)
Hemoglobin: 10.8 g/dL — ABNORMAL LOW (ref 13.0–17.0)
Immature Granulocytes: 2 %
Lymphocytes Relative: 20 %
Lymphs Abs: 2.2 10*3/uL (ref 0.7–4.0)
MCH: 27.8 pg (ref 26.0–34.0)
MCHC: 31.8 g/dL (ref 30.0–36.0)
MCV: 87.4 fL (ref 80.0–100.0)
Monocytes Absolute: 0.5 10*3/uL (ref 0.1–1.0)
Monocytes Relative: 5 %
Neutro Abs: 7.4 10*3/uL (ref 1.7–7.7)
Neutrophils Relative %: 67 %
Platelets: 482 10*3/uL — ABNORMAL HIGH (ref 150–400)
RBC: 3.89 MIL/uL — ABNORMAL LOW (ref 4.22–5.81)
RDW: 14.5 % (ref 11.5–15.5)
WBC: 11 10*3/uL — ABNORMAL HIGH (ref 4.0–10.5)
nRBC: 0 % (ref 0.0–0.2)

## 2019-07-30 LAB — RAPID URINE DRUG SCREEN, HOSP PERFORMED
Amphetamines: NOT DETECTED
Barbiturates: NOT DETECTED
Benzodiazepines: NOT DETECTED
Cocaine: POSITIVE — AB
Opiates: NOT DETECTED
Tetrahydrocannabinol: NOT DETECTED

## 2019-07-30 LAB — COMPREHENSIVE METABOLIC PANEL
ALT: 16 U/L (ref 0–44)
AST: 19 U/L (ref 15–41)
Albumin: 2.2 g/dL — ABNORMAL LOW (ref 3.5–5.0)
Alkaline Phosphatase: 46 U/L (ref 38–126)
Anion gap: 8 (ref 5–15)
BUN: 9 mg/dL (ref 6–20)
CO2: 25 mmol/L (ref 22–32)
Calcium: 8.4 mg/dL — ABNORMAL LOW (ref 8.9–10.3)
Chloride: 108 mmol/L (ref 98–111)
Creatinine, Ser: 1 mg/dL (ref 0.61–1.24)
GFR calc Af Amer: >60 mL/min (ref >60)
GFR calc non Af Amer: >60 mL/min (ref >60)
Glucose, Bld: 112 mg/dL — ABNORMAL HIGH (ref 70–99)
Potassium: 4 mmol/L (ref 3.5–5.1)
Sodium: 141 mmol/L (ref 135–145)
Total Bilirubin: 0.3 mg/dL (ref 0.3–1.2)
Total Protein: 6.9 g/dL (ref 6.5–8.1)

## 2019-07-30 LAB — ECHOCARDIOGRAM LIMITED
Height: 70 in
Weight: 2719.98 oz

## 2019-07-30 LAB — HIV ANTIBODY (ROUTINE TESTING W REFLEX): HIV Screen 4th Generation wRfx: NONREACTIVE

## 2019-07-30 LAB — PROCALCITONIN: Procalcitonin: 0.51 ng/mL

## 2019-07-30 LAB — PHOSPHORUS: Phosphorus: 4 mg/dL (ref 2.5–4.6)

## 2019-07-30 LAB — LACTATE DEHYDROGENASE: LDH: 94 U/L — ABNORMAL LOW (ref 98–192)

## 2019-07-30 LAB — HEPATITIS B SURFACE ANTIGEN: Hepatitis B Surface Ag: NONREACTIVE

## 2019-07-30 LAB — D-DIMER, QUANTITATIVE: D-Dimer, Quant: 2.15 ug/mL-FEU — ABNORMAL HIGH (ref 0.00–0.50)

## 2019-07-30 LAB — MAGNESIUM: Magnesium: 1.6 mg/dL — ABNORMAL LOW (ref 1.7–2.4)

## 2019-07-30 LAB — ABO/RH: ABO/RH(D): O POS

## 2019-07-30 LAB — PREALBUMIN: Prealbumin: 12.3 mg/dL — ABNORMAL LOW (ref 18–38)

## 2019-07-30 LAB — TSH: TSH: 1.424 u[IU]/mL (ref 0.350–4.500)

## 2019-07-30 LAB — FIBRINOGEN: Fibrinogen: 466 mg/dL (ref 210–475)

## 2019-07-30 MED ORDER — VANCOMYCIN HCL 1750 MG/350ML IV SOLN
1750.0000 mg | Freq: Two times a day (BID) | INTRAVENOUS | Status: DC
  Administered 2019-07-30 – 2019-08-03 (×9): 1750 mg via INTRAVENOUS
  Filled 2019-07-30 (×10): qty 350

## 2019-07-30 MED ORDER — POTASSIUM CHLORIDE 10 MEQ/100ML IV SOLN
10.0000 meq | INTRAVENOUS | Status: AC
  Administered 2019-07-30 (×3): 10 meq via INTRAVENOUS
  Filled 2019-07-30 (×3): qty 100

## 2019-07-30 MED ORDER — MAGNESIUM SULFATE 2 GM/50ML IV SOLN
2.0000 g | Freq: Once | INTRAVENOUS | Status: AC
  Administered 2019-07-30: 2 g via INTRAVENOUS
  Filled 2019-07-30: qty 50

## 2019-07-30 MED ORDER — HYDRALAZINE HCL 20 MG/ML IJ SOLN
10.0000 mg | Freq: Once | INTRAMUSCULAR | Status: AC
  Administered 2019-07-30: 10 mg via INTRAVENOUS
  Filled 2019-07-30: qty 1

## 2019-07-30 MED ORDER — SODIUM CHLORIDE 0.9 % IV SOLN
2.0000 g | INTRAVENOUS | Status: DC
  Administered 2019-07-30 – 2019-08-02 (×4): 2 g via INTRAVENOUS
  Filled 2019-07-30 (×3): qty 2
  Filled 2019-07-30 (×2): qty 20

## 2019-07-30 NOTE — Consult Note (Signed)
Reason for Consult:right leg cellulitis,IV drug abuser, positive COVID Referring Physician: Caleb PoppNettey MD  Reatha HarpsLouis Nathaniel Tapia is an 34 y.o. male.  HPI: 34 year old male IV drug abuser with cellulitis previously seen by me on 07/23/2019 was in the hospital for cellulitis.  Patient had Doppler test negative for DVT MRI scan negative for joint infection.  Cellulitis treated with antibiotics.  He was getting better and then has had increased swelling in his leg and edema.  C-reactive protein on 07/26/2019 was 10.9 and yesterday was 6.9.  His white blood count is gone from 24,000 to 15.9 thousand yesterday and today has dropped to 11,000.  Drug screen positive for cocaine.  He remains significantly malnourished with albumin of 2.2.  Past Medical History:  Diagnosis Date  . Heroin abuse (HCC)   . Hypertension   . IV drug user   . Neck abscess 04/2019  . Overdose   . Polysubstance abuse [facility](HCC)     Past Surgical History:  Procedure Laterality Date  . I & D EXTREMITY Left 11/19/2016   Procedure: INCISION AND DRAINAGE LEFT ELBOW  ABSCESS;  Surgeon: Myrene GalasHandy, Michael, MD;  Location: [facility] OR;  Service: Orthopedics;  Laterality: Left;  . INCISION AND DRAINAGE ABSCESS Left 04/17/2019   Procedure: INCISION AND DRAINAGE NECK ABSCESS;  Surgeon: Suzanna ObeyByers, John, MD;  [facility];  Service: ENT;  Laterality: Left;    Family History  Problem Relation Age of Onset  . Healthy Mother   . Healthy Father     Social History:  reports that he quit smoking about 19 months ago. He has never used smokeless tobacco. He reports current alcohol use. He reports current drug use. Drugs: IV and Cocaine.  Allergies:  Allergies  Allergen Reactions  . Bee Venom Swelling    Medications: I have reviewed the patient's current medications.  Results for orders placed or performed during the hospital encounter of 07/29/19 (from the past 48 hour(s))  Comprehensive metabolic panel     Status: Abnormal   Collection Time: 07/29/19   3:43 PM  Result Value Ref Range   Sodium 140 135 - 145 mmol/L   Potassium 3.0 (L) 3.5 - 5.1 mmol/L   Chloride 105 98 - 111 mmol/L   CO2 27 22 - 32 mmol/L   Glucose, Bld 93 70 - 99 mg/dL    Comment: Glucose reference range applies only to samples taken after fasting for at least 8 hours.   BUN 10 6 - 20 mg/dL   Creatinine, Ser 1.610.79 0.61 - 1.24 mg/dL   Calcium 8.5 (L) 8.9 - 10.3 mg/dL   Total Protein 7.9 6.5 - 8.1 g/dL   Albumin 2.7 (L) 3.5 - 5.0 g/dL   AST 22 15 - 41 U/L   ALT 18 0 - 44 U/L   Alkaline Phosphatase 54 38 - 126 U/L   Total Bilirubin 0.4 0.3 - 1.2 mg/dL   GFR calc non Af Amer >60 >60 mL/min   GFR calc Af Amer >60 >60 mL/min   Anion gap 8 5 - 15    Comment: Performed at [facility], [address] [place], [place] [id number]  CBC with Differential     Status: Abnormal   Collection Time: 07/29/19  3:43 PM  Result Value Ref Range   WBC 15.9 (H) 4.0 - 10.5 K/uL   RBC 4.21 (L) 4.22 - 5.81 MIL/uL   Hemoglobin 12.0 (L) 13.0 - 17.0 g/dL   HCT 54.038.0 (L) 98.139.0 - 19.152.0 %  MCV 90.3 80.0 - 100.0 fL   MCH 28.5 26.0 - 34.0 pg   MCHC 31.6 30.0 - 36.0 g/dL   RDW 16.1 09.6 - 04.5 %   Platelets 464 (H) 150 - 400 K/uL   nRBC 0.0 0.0 - 0.2 %   Neutrophils Relative % 70 %   Neutro Abs 11.1 (H) 1.7 - 7.7 K/uL   Lymphocytes Relative 18 %   Lymphs Abs 2.8 0.7 - 4.0 K/uL   Monocytes Relative 4 %   Monocytes Absolute 0.7 0.1 - 1.0 K/uL   Eosinophils Relative 5 %   Eosinophils Absolute 0.8 (H) 0.0 - 0.5 K/uL   Basophils Relative 1 %   Basophils Absolute 0.1 0.0 - 0.1 K/uL   Immature Granulocytes 2 %   Abs Immature Granulocytes 0.33 (H) 0.00 - 0.07 K/uL    Comment: Performed at [facility], [address], [place], [place] [address]  Lactic acid, plasma     Status: None   Collection Time: 07/29/19  3:43 PM  Result Value Ref Range   Lactic Acid, Venous 1.7 0.5 - 1.9 mmol/L    Comment: Performed at [facility] [address] [place], [place] [address]  Protime-INR     Status: None   Collection Time: 07/29/19  3:43 PM  Result Value Ref Range   Prothrombin Time 14.4 11.4 - 15.2 seconds   INR 1.1 0.8 - 1.2    Comment: (NOTE) INR goal varies based on device and disease states. Performed at [facility], [address], [place], [place] [address]   Blood culture (routine x 2)     Status: None (Preliminary result)   Collection Time: 07/29/19  3:51 PM   Specimen: BLOOD LEFT FOREARM  Result Value Ref Range   Specimen Description      BLOOD LEFT FOREARM Performed at [facility], [address], [place], [place] [address]    Special Requests      BOTTLES DRAWN AEROBIC AND ANAEROBIC Blood Culture results may not be optimal due to an inadequate volume of blood received in culture bottles Performed at [facility], [address], [place], [place] [address]    Culture      NO GROWTH < 24 HOURS Performed at [facility], [address] [place], [place] [address]    Report Status PENDING   Blood culture (routine x 2)     Status: None (Preliminary result)   Collection Time: 07/29/19  3:56 PM   Specimen: BLOOD  Result Value Ref Range   Specimen Description      BLOOD LEFT ANTECUBITAL Performed at [facility], [address], [place], [place] [address]    Special Requests      BOTTLES DRAWN AEROBIC AND ANAEROBIC Blood Culture adequate volume Performed at [facility], [address] [place], [place] [address]    Culture      NO GROWTH < 24 HOURS Performed at [facility], [address] [place], [place] [address]    Report Status PENDING   Hepatic function panel     Status: Abnormal   Collection Time: 07/29/19  4:53 PM  Result Value Ref Range   Total Protein 7.0 6.5 - 8.1 g/dL   Albumin 2.2 (L) 3.5 - 5.0 g/dL   AST 19 15 - 41 U/L   ALT 17 0 - 44 U/L   Alkaline Phosphatase 46 38 - 126  U/L    Total Bilirubin 0.4 0.3 - 1.2 mg/dL   Bilirubin, Direct 0.1 0.0 - 0.2 mg/dL   Indirect Bilirubin 0.3 0.3 - 0.9 mg/dL    Comment: Performed at [facility], [place] [address], [place], [place] [address]  C-reactive protein     Status: Abnormal   Collection Time: 07/29/19  7:17 PM  Result Value Ref Range   CRP 6.9 (H) <1.0 mg/dL    Comment: Performed at [facility], [place] [address] [place] [address] [place], [place] [address]  Ferritin     Status: None   Collection Time: 07/29/19  7:17 PM  Result Value Ref Range   Ferritin 141 24 - 336 ng/mL    Comment: Performed at [facility], [place] [address], [place], [place] [address]  Type and screen [place]     Status: None   Collection Time: 07/29/19  7:17 PM  Result Value Ref Range   ABO/RH(D) O POS    Antibody Screen NEG    Sample Expiration      08/01/2019,2359 Performed at [facility], [place] [address] [place], [place] [address]   ABO/Rh     Status: None   Collection Time: 07/29/19  7:17 PM  Result Value Ref Range   ABO/RH(D)      O POS Performed at [facility], [place] [address] [place], [place] [address]   Lactic acid, plasma     Status: None   Collection Time: 07/29/19 11:15 PM  Result Value Ref Range   Lactic Acid, Venous 1.1 0.5 - 1.9 mmol/L    Comment: Performed at [facility], [place] [address], [place], [place] [address]  D-dimer, quantitative (not at [facility])     Status: Abnormal   Collection Time: 07/29/19 11:15 PM  Result Value Ref Range   D-Dimer, Quant 2.15 (H) 0.00 - 0.50 ug/mL-FEU    Comment: (NOTE) At the manufacturer cut-off of 0.50 ug/mL FEU, this assay has been documented to exclude PE with a sensitivity and negative predictive value of 97 to 99%.  At this time, this assay has not been approved by the FDA to exclude DVT/VTE. Results should be correlated with clinical presentation. Performed at [facility], [place] [address] [place] [address] [place], [place] [address]   Fibrinogen     Status: None   Collection Time: 07/29/19 11:15 PM  Result Value Ref Range   Fibrinogen 466 210 - 475 mg/dL    Comment: Performed at [facility], [place] [address], [place], [place] [address]  Lactate dehydrogenase     Status: Abnormal   Collection Time: 07/29/19 11:15 PM  Result Value Ref Range   LDH 94 (L) 98 - 192 U/L    Comment: Performed at [facility], [place] [address] [place], [place] [address]  Procalcitonin     Status: None   Collection Time: 07/29/19 11:15 PM  Result Value Ref Range   Procalcitonin 0.51 ng/mL    Comment:        Interpretation: PCT > 0.5 ng/mL and <= 2 ng/mL: Systemic infection (sepsis) is possible, but other conditions are known to elevate PCT as well. (NOTE)       Sepsis PCT Algorithm           Lower Respiratory Tract  Infection PCT Algorithm    ----------------------------     ----------------------------         PCT < 0.25 ng/mL                PCT < 0.10 ng/mL         Strongly encourage             Strongly discourage   discontinuation of antibiotics    initiation of antibiotics    ----------------------------     -----------------------------       PCT 0.25 - 0.50 ng/mL            PCT 0.10 - 0.25 ng/mL               OR       >80% decrease in PCT            Discourage initiation of                                            antibiotics      Encourage discontinuation           of antibiotics    ----------------------------     -----------------------------         PCT >= 0.50 ng/mL              PCT 0.26 - 0.50 ng/mL                AND       <80% decrease in PCT             Encourage initiation of                                             antibiotics       Encourage continuation           of antibiotics    ----------------------------     -----------------------------        PCT >= 0.50  ng/mL                  PCT > 0.50 ng/mL               AND         increase in PCT                  Strongly encourage                                      initiation of antibiotics    Strongly encourage escalation           of antibiotics                                     -----------------------------                                           PCT <= 0.25 ng/mL  OR                                        > 80% decrease in PCT                                     Discontinue / Do not initiate                                             antibiotics Performed at [facility], [address], [place], [place] [address]   Magnesium     Status: Abnormal   Collection Time: 07/29/19 11:15 PM  Result Value Ref Range   Magnesium 1.6 (L) 1.7 - 2.4 mg/dL    Comment: Performed at [facility], [address], [place], [place] [address]  Phosphorus     Status: None   Collection Time: 07/29/19 11:15 PM  Result Value Ref Range   Phosphorus 4.5 2.5 - 4.6 mg/dL    Comment: Performed at [facility], [address] [place], [place] [address]  Rapid urine drug screen (hospital performed)     Status: Abnormal   Collection Time: 07/30/19 12:56 AM  Result Value Ref Range   Opiates NONE DETECTED NONE DETECTED   Cocaine POSITIVE (A) NONE DETECTED   Benzodiazepines NONE DETECTED NONE DETECTED   Amphetamines NONE DETECTED NONE DETECTED   Tetrahydrocannabinol NONE DETECTED NONE DETECTED   Barbiturates NONE DETECTED NONE DETECTED    Comment: (NOTE) DRUG SCREEN FOR MEDICAL PURPOSES ONLY.  IF CONFIRMATION IS NEEDED FOR ANY PURPOSE, NOTIFY LAB WITHIN 5 DAYS. LOWEST DETECTABLE LIMITS FOR URINE DRUG SCREEN Drug Class                     Cutoff (ng/mL) Amphetamine and metabolites    1000 Barbiturate and metabolites    200 Benzodiazepine                 200 Tricyclics and metabolites     300 Opiates and  metabolites        300 Cocaine and metabolites        300 THC                            50 Performed at [facility], [address], [place], [place] [address]   Magnesium     Status: Abnormal   Collection Time: 07/30/19  4:54 AM  Result Value Ref Range   Magnesium 1.6 (L) 1.7 - 2.4 mg/dL    Comment: Performed at [facility], [address], [place], [place] [address]  Phosphorus     Status: None   Collection Time: 07/30/19  4:54 AM  Result Value Ref Range   Phosphorus 4.0 2.5 - 4.6 mg/dL    Comment: Performed at [facility], [address] [place], [place] [address]  CBC WITH DIFFERENTIAL     Status: Abnormal   Collection Time: 07/30/19  4:54 AM  Result Value Ref Range   WBC 11.0 (H) 4.0 - 10.5 K/uL   RBC 3.89 (L) 4.22 - 5.81 MIL/uL  Hemoglobin 10.8 (L) 13.0 - 17.0 g/dL   HCT 91.4 (L) 78.2 - 95.6 %   MCV 87.4 80.0 - 100.0 fL   MCH 27.8 26.0 - 34.0 pg   MCHC 31.8 30.0 - 36.0 g/dL   RDW 21.3 08.6 - 57.8 %   Platelets 482 (H) 150 - 400 K/uL   nRBC 0.0 0.0 - 0.2 %   Neutrophils Relative % 67 %   Neutro Abs 7.4 1.7 - 7.7 K/uL   Lymphocytes Relative 20 %   Lymphs Abs 2.2 0.7 - 4.0 K/uL   Monocytes Relative 5 %   Monocytes Absolute 0.5 0.1 - 1.0 K/uL   Eosinophils Relative 5 %   Eosinophils Absolute 0.5 0.0 - 0.5 K/uL   Basophils Relative 1 %   Basophils Absolute 0.1 0.0 - 0.1 K/uL   Immature Granulocytes 2 %   Abs Immature Granulocytes 0.20 (H) 0.00 - 0.07 K/uL    Comment: Performed at [facility], [address] [place], [place] [address]  TSH     Status: None   Collection Time: 07/30/19  4:54 AM  Result Value Ref Range   TSH 1.424 0.350 - 4.500 uIU/mL    Comment: Performed by a 3rd Generation assay with a functional sensitivity of <=0.01 uIU/mL. Performed at [facility] [address], [place], [place] [address]   Comprehensive metabolic panel     Status: Abnormal    Collection Time: 07/30/19  4:54 AM  Result Value Ref Range   Sodium 141 135 - 145 mmol/L   Potassium 4.0 3.5 - 5.1 mmol/L    Comment: DELTA CHECK NOTED NO VISIBLE HEMOLYSIS    Chloride 108 98 - 111 mmol/L   CO2 25 22 - 32 mmol/L   Glucose, Bld 112 (H) 70 - 99 mg/dL    Comment: Glucose reference range applies only to samples taken after fasting for at least 8 hours.   BUN 9 6 - 20 mg/dL   Creatinine, Ser 1.32 0.61 - 1.24 mg/dL   Calcium 8.4 (L) 8.9 - 10.3 mg/dL   Total Protein 6.9 6.5 - 8.1 g/dL   Albumin 2.2 (L) 3.5 - 5.0 g/dL   AST 19 15 - 41 U/L   ALT 16 0 - 44 U/L   Alkaline Phosphatase 46 38 - 126 U/L   Total Bilirubin 0.3 0.3 - 1.2 mg/dL   GFR calc non Af Amer >60 >60 mL/min   GFR calc Af Amer >60 >60 mL/min   Anion gap 8 5 - 15    Comment: Performed at [facility], [address], [place], [place] [address]    DG CHEST PORT 1 VIEW  Result Date: 07/29/2019 CLINICAL DATA:  Right leg swelling. Ex-smoker. EXAM: PORTABLE CHEST 1 VIEW COMPARISON:  07/23/2018 FINDINGS: Interval mildly enlarged cardiac silhouette. Clear lungs with normal vascularity. Normal appearing bones. IMPRESSION: Interval mild cardiomegaly. No acute abnormality. Electronically Signed   By: Beckie Salts M.D.   On: 07/29/2019 19:37   DG Tibia/Fibula Right Port  Result Date: 07/29/2019 CLINICAL DATA:  Cellulitis right tib fib. EXAM: PORTABLE RIGHT TIBIA AND FIBULA - 2 VIEW COMPARISON:  MRI 07/23/2019. FINDINGS: Cortical margins of the tibia and fibular intact. No erosion or periosteal reaction. There is no evidence of fracture or other focal bone lesions. Knee and ankle alignment are maintained. Diffuse subcutaneous edema of the lower extremity. No soft tissue air. No radiopaque foreign body. IMPRESSION: Diffuse subcutaneous edema of the lower extremity. No  soft tissue air or osseous abnormality. Electronically Signed   By: Narda Rutherford M.D.   On: 07/29/2019 23:59   VAS [place] LOWER EXTREMITY  VENOUS (DVT)  Result Date: 07/29/2019  Lower Venous DVTStudy Indications: Pain, Swelling, Edema, Erythema, and Recent diagnosis of Covid and of cellulitis, failed antibiotics.  Comparison Study: Prior study from 07/22/19 is available for comparison Performing Technologist: Sherren Kerns RVS  Examination Guidelines: A complete evaluation includes B-mode imaging, spectral Doppler, color Doppler, and power Doppler as needed of all accessible portions of each vessel. Bilateral testing is considered an integral part of a complete examination. Limited examinations for reoccurring indications may be performed as noted. The reflux portion of the exam is performed with the patient in reverse Trendelenburg.  +---------+---------------+---------+-----------+----------+-------------------+ RIGHT    CompressibilityPhasicitySpontaneityPropertiesThrombus Aging      +---------+---------------+---------+-----------+----------+-------------------+ CFV      Full           Yes      No                                       +---------+---------------+---------+-----------+----------+-------------------+ SFJ      Full                    Yes                                      +---------+---------------+---------+-----------+----------+-------------------+ FV Prox  Full                                                             +---------+---------------+---------+-----------+----------+-------------------+ FV Mid                                                patent by color and                                                       Doppler             +---------+---------------+---------+-----------+----------+-------------------+ FV Distal                                             patent by color and                                                       Doppler             +---------+---------------+---------+-----------+----------+-------------------+ PFV  patent by color and                                                       Doppler             +---------+---------------+---------+-----------+----------+-------------------+ POP      Full           Yes      Yes                                      +---------+---------------+---------+-----------+----------+-------------------+ PTV      Full                                                             +---------+---------------+---------+-----------+----------+-------------------+ PERO     Full                                                             +---------+---------------+---------+-----------+----------+-------------------+ Interstitial fluid noted throughout  +----+---------------+---------+-----------+----------+--------------+ LEFTCompressibilityPhasicitySpontaneityPropertiesThrombus Aging +----+---------------+---------+-----------+----------+--------------+ CFV Full           Yes      Yes                                 +----+---------------+---------+-----------+----------+--------------+     Summary: RIGHT: - There is no evidence of deep vein thrombosis in the lower extremity.  - Ultrasound characteristics of enlarged lymph nodes are noted in the groin.  LEFT: - No evidence of common femoral vein obstruction. - Ultrasound characteristics of enlarged lymph nodes noted in the groin.  *See table(s) above for measurements and observations.    Preliminary     ROS review of systems unchanged from 07/23/2019 consultation other than as mentioned in HPI. Blood pressure 136/84, pulse 77, temperature 100.1 F (37.8 C), temperature source Oral, resp. rate 16, height 5\' 10"  (1.778 m), weight 77.1 kg, SpO2 100 %. Physical Exam  Constitutional: He is oriented to person, place, and time.  Thin, chronically ill appearance.  HENT:  Head: Normocephalic.  Eyes: Pupils are equal, round, and reactive to light.  Neck: No tracheal  deviation present.  Cardiovascular: Normal rate.  Respiratory: Effort normal.  Neurological: He is alert and oriented to person, place, and time.  Skin: There is erythema.  Patient has subcutaneous swelling slight erythema slight increased warmth the right lower extremity.  Scars noted over the dorsum of the right foot.  Good knee range of motion no knee effusion is noted no ankle effusion good ankle range of motion is noted.  Assessment/Plan: Cellulitis, malnourishment.  X-ray showed no evidence of gas in the soft tissue.  I do not think MRI scan is indicated and it appears that labs are improving with current treatment.  Nutritional supplements may be helpful.  I discussed with patient he needs better nutrition and drug  use avoidance plus the antibiotics to improve his chance of resolution of his cellulitis problems with infection.    Eldred Manges 07/30/2019, 9:01 AM

## 2019-07-30 NOTE — Progress Notes (Signed)
  Echocardiogram 2D Echocardiogram has been performed.  Grant Tapia 07/30/2019, 9:27 AM

## 2019-07-30 NOTE — Progress Notes (Signed)
[facility]  PROGRESS NOTE  Grant Tapia UKG:[id number] DOB: [birth year] DOA: 07/29/2019 PCP: Patient, No Pcp Per Admit date - 07/29/2019   Admitting Physician Therisa Doyne, MD  Outpatient Primary MD for the patient is Patient, No Pcp Per  LOS - 1 Brief Narrative   Grant Tapia is a 34 y.o. year old male with medical history significant for IVDU, recent hospitalization from 4/10-4/14 for right knee cellulitis with joint effusion (dry tap by ED, and Ortho agreed with medical management) he was discharged on empiric doxycycline and augmentin after septic joint/DVT/endocarditis ruled out on MRI/venous duplex/TTE  g, Recent covid diagonosis during last hospitalizationwho presented on 07/29/2019 with complaints of "infection not getting any better" with worsening redness, swelling, and pain despite adherence to antibiotics.  He was found to have tmax of 100, WBC of 28.6, CRP of 6.9 ( 10.9 on 4/14)and lactic acid 1.1, procalcitonin0.51.    Subjective  Today he is feeling much better, feels his right knee swelling has decreased and efficiently  A & P    Sepsis secondary to Nonpurulent cellulitis of right leg/knee, improving.  White count has gone down significantly from 28 to 11, swelling and redness has decreased noticeably per patient, x-ray shows subcutaneous edema but no soft tissue air/osseous abnormalities.  Evaluated by Ortho who agrees continue antibiotic regimen and no current need for surgical intervention or MRI imaging given significant improvement in symptoms and sepsis parameters.  Given failed outpatient therapy in setting of continued IV drug use, patient warrants continue IV antibiotics, will continue to monitor blood cultures, TTE. -Monitor blood cultures -Follow-up TTE --ortho consulted, appreciate recommendations to continue antibiotics --empiric vancomycin and ceftriaxone.   IVDU. Still using cocaine. States wants to abstain. -Started on Suboxone  protocol on admission -Consult TOC to assist with substance abuse programs/resources as outpatient  COVID 19 infection.  Diagnosed 07/22/2019.  Remains asymptomatic, no cough Nonacute findings on chest x-ray hypoxia.  D-dimer 2.15, LDH and ferritin unremarkable --airborne/contact precautions -Continue close monitor, does not meet criteria to start treatment  HTN.  Blood pressure is 131/85-157/109.  Listed as having amlodipine on med rec -Continue closely monitor  Hypomagnesemia.  1.6 on admission. -IV repletion -Repeat in a.m.  Suspect poor nutrition.  Albumin 2.2. -Check prealbumin, nutrition consulted   Family Communication  : None  Code Status : Full  Disposition Plan  :  Patient is from home. Anticipated d/c date: 2 to 3 days. Barriers to d/c or necessity for inpatient status:  IV antibiotics for severe cellulitis, monitor blood cultures, TTE Consults  : Orthopedics  Procedures  : None  DVT Prophylaxis  : SCDs  Lab Results  Component Value Date   PLT 482 (H) 07/30/2019    Diet :  Diet Order            Diet regular Room service appropriate? Yes; Fluid consistency: Thin  Diet effective now               Inpatient Medications Scheduled Meds: . buprenorphine-naloxone  1 tablet Sublingual BID  . sodium chloride flush  3 mL Intravenous Q12H  . sodium chloride flush  3 mL Intravenous Q12H   Continuous Infusions: . sodium chloride    . sodium chloride 250 mL (07/29/19 2356)  . cefTRIAXone (ROCEPHIN)  IV    . metronidazole 500 mg (07/30/19 0532)  . vancomycin 1,750 mg (07/30/19 0653)   PRN Meds:.sodium chloride, sodium chloride, buprenorphine-naloxone, hydrOXYzine, ibuprofen, loperamide, ondansetron **OR** ondansetron (ZOFRAN) IV, sodium chloride  flush, traMADol  Antibiotics  :   Anti-infectives (From admission, onward)   Start     Dose/Rate Route Frequency Ordered Stop   07/30/19 1600  cefTRIAXone (ROCEPHIN) 2 g in sodium chloride 0.9 % 100 mL IVPB     2  g 200 mL/hr over 30 Minutes Intravenous Every 24 hours 07/30/19 0520     07/30/19 0600  vancomycin (VANCOREADY) IVPB 1750 mg/350 mL     1,750 mg 175 mL/hr over 120 Minutes Intravenous Every 12 hours 07/30/19 0020     07/29/19 1930  metroNIDAZOLE (FLAGYL) IVPB 500 mg     500 mg 100 mL/hr over 60 Minutes Intravenous Every 8 hours 07/29/19 1928     07/29/19 1730  vancomycin (VANCOREADY) IVPB 1750 mg/350 mL     1,750 mg 175 mL/hr over 120 Minutes Intravenous  Once 07/29/19 1625 07/29/19 2123   07/29/19 1615  cefTRIAXone (ROCEPHIN) 2 g in sodium chloride 0.9 % 100 mL IVPB     2 g 200 mL/hr over 30 Minutes Intravenous  Once 07/29/19 1554 07/29/19 1841   07/29/19 1600  vancomycin (VANCOCIN) IVPB 1000 mg/200 mL premix  Status:  Discontinued     1,000 mg 200 mL/hr over 60 Minutes Intravenous  Once 07/29/19 1554 07/29/19 1625       Objective   Vitals:   07/29/19 2246 07/29/19 2300 07/29/19 2326 07/30/19 0327  BP: (!) 157/109  131/85 136/84  Pulse: 81  84 77  Resp: Temp: 99.2 F (37.3 C)   100.1 F (37.8 C)  TempSrc: Oral   Oral  SpO2: 100%   100%  Weight:      Height:        SpO2: 100 %  Wt Readings from Last 3 Encounters:  07/29/19 77.1 kg  07/22/19 77.1 kg  04/16/19 83.9 kg     Intake/Output Summary (Last 24 hours) at 07/30/2019 0801 Last data filed at 07/30/2019 0531 Gross per 24 hour  Intake 556.48 ml  Output 2150 ml  Net -1593.52 ml    Physical Exam:  Awake Alert, Oriented X 3, Normal affect No new F.N deficits,  [place].AT, Normal respiratory effort on room air, CTAB RRR,No Gallops,Rubs or new Murmurs, Slightly erythematous right knee and distal right leg, slightly tender on palpation, decreased swelling of right knee +ve B.Sounds, Abd Soft, No tenderness, No rebound, guarding or rigidity. No Cyanosis, No new Rash or bruise     I have personally reviewed the following:   Data Reviewed:  CBC Recent Labs  Lab 07/24/19 0354 07/25/19 0720  07/26/19 0453 07/29/19 1543 07/30/19 0454  WBC 26.8* 27.8* 24.0* 15.9* 11.0*  HGB 10.6* 10.9* 12.0* 12.0* 10.8*  HCT 33.3* 34.2* 38.2* 38.0* 34.0*  PLT 339 417* 540* 464* 482*  MCV 89.0 87.7 90.7 90.3 87.4  MCH 28.3 27.9 28.5 28.5 27.8  MCHC 31.8 31.9 31.4 31.6 31.8  RDW 15.0 15.0 15.2 14.6 14.5  LYMPHSABS  --   --   --  2.8 2.2  MONOABS  --   --   --  0.7 0.5  EOSABS  --   --   --  0.8* 0.5  BASOSABS  --   --   --  0.1 0.1    Chemistries  Recent Labs  Lab 07/24/19 0354 07/24/19 0354 07/25/19 0446 07/25/19 0720 07/26/19 0453 07/29/19 1543 07/29/19 1653 07/29/19 2315 07/30/19 0454  NA 137  --   --  139 141 140  --   --  141  K 3.3*  --   --  3.4* 3.7 3.0*  --   --  4.0  CL 105  --   --  108 110 105  --   --  108  CO2 21*  --   --  21* 22 27  --   --  25  GLUCOSE 107*  --   --  129* 140* 93  --   --  112*  BUN 21*  --   --  18 18 10   --   --  9  CREATININE 1.22   < > 1.01 1.03 1.00 0.79  --   --  1.00  CALCIUM 7.8*  --   --  8.3* 8.5* 8.5*  --   --  8.4*  MG 1.6*  --   --   --  1.7  --   --  1.6* 1.6*  AST  --   --   --  16  --  22 19  --  19  ALT  --   --   --  16  --  18 17  --  16  ALKPHOS  --   --   --  79  --  54 46  --  46  BILITOT  --   --   --  0.5  --  0.4 0.4  --  0.3   < > = values in this interval not displayed.   ------------------------------------------------------------------------------------------------------------------ No results for input(s): CHOL, HDL, LDLCALC, TRIG, CHOLHDL, LDLDIRECT in the last 72 hours.  No results found for: HGBA1C ------------------------------------------------------------------------------------------------------------------ Recent Labs    07/30/19 0454  TSH 1.424   ------------------------------------------------------------------------------------------------------------------ Recent Labs    07/29/19 1917  FERRITIN 141    Coagulation profile Recent Labs  Lab 07/29/19 1543  INR 1.1    Recent Labs     07/29/19 2315  DDIMER 2.15*    Cardiac Enzymes No results for input(s): CKMB, TROPONINI, MYOGLOBIN in the last 168 hours.  Invalid input(s): CK ------------------------------------------------------------------------------------------------------------------ No results found for: BNP  Micro Results Recent Results (from the past 240 hour(s))  Respiratory Panel by RT PCR (Flu A&B, Covid) - Nasopharyngeal Swab     Status: Abnormal   Collection Time: 07/22/19  9:09 PM   Specimen: Nasopharyngeal Swab  Result Value Ref Range Status   SARS Coronavirus 2 by RT PCR POSITIVE (A) NEGATIVE Final    Comment: RESULT CALLED TO, READ BACK BY AND VERIFIED WITH: MAYNARD,C AT 0022 ON [id number] BY CHERESNOWSKY,T (NOTE) SARS-CoV-2 target nucleic acids are DETECTED. SARS-CoV-2 RNA is generally detectable in upper respiratory specimens  during the acute phase of infection. Positive results are indicative of the presence of the identified virus, but do not rule out bacterial infection or co-infection with other pathogens not detected by the test. Clinical correlation with patient history and other diagnostic information is necessary to determine patient infection status. The expected result is Negative. Fact Sheet for Patients:  https://www.moore.com/https://www.fda.gov/media/142436/download Fact Sheet for Healthcare Providers: https://www.young.biz/https://www.fda.gov/media/142435/download This test is not yet approved or cleared by the [place] FDA and  has been authorized for detection and/or diagnosis of SARS-CoV-2 by FDA under an Emergency Use Authorization (EUA).  This EUA will remain in effect (meaning this test can  be used) for the duration of  the COVID-19 declaration under Section 564(b)(1) of the Act, 21 U.S.C. section 360bbb-3(b)(1), unless the authorization is terminated or revoked sooner.    Influenza A by PCR NEGATIVE NEGATIVE Final   Influenza  B by PCR NEGATIVE NEGATIVE Final    Comment: (NOTE) The Xpert Xpress  SARS-CoV-2/FLU/RSV assay is intended as an aid in  the diagnosis of influenza from Nasopharyngeal swab specimens and  should not be used as a sole basis for treatment. Nasal washings and  aspirates are unacceptable for Xpert Xpress SARS-CoV-2/FLU/RSV  testing. Fact Sheet for Patients: https://www.moore.com/ Fact Sheet for Healthcare Providers: https://www.young.biz/ This test is not yet approved or cleared by the [place] FDA and  has been authorized for detection and/or diagnosis of SARS-CoV-2 by  FDA under an Emergency Use Authorization (EUA). This EUA will remain  in effect (meaning this test can be used) for the duration of the  Covid-19 declaration under Section 564(b)(1) of the Act, 21  U.S.C. section 360bbb-3(b)(1), unless the authorization is  terminated or revoked. Performed at [facility], [address] [place], [place] [address]   Culture, blood (routine x 2)     Status: None   Collection Time: 07/22/19  9:15 PM   Specimen: BLOOD LEFT ARM  Result Value Ref Range Status   Specimen Description   Final    BLOOD LEFT ARM Performed at [facility], [address] [place], [place] [address]    Special Requests   Final    BOTTLES DRAWN AEROBIC AND ANAEROBIC Blood Culture adequate volume Performed at [facility], [address] [place], [place] [address]    Culture   Final    NO GROWTH 5 DAYS Performed at [facility], [address] [place], [place] [address]    Report Status 07/27/2019 FINAL  Final  Culture, blood (routine x 2)     Status: None   Collection Time: 07/22/19  9:25 PM   Specimen: BLOOD RIGHT ARM  Result Value Ref Range Status   Specimen Description   Final    BLOOD RIGHT ARM Performed at [facility], [address] [place], [place] [address]    Special Requests   Final    BOTTLES DRAWN AEROBIC AND ANAEROBIC Blood Culture results may not be optimal due to  an inadequate volume of blood received in culture bottles Performed at [facility], [address] [place], [place] [address]    Culture   Final    NO GROWTH 5 DAYS Performed at [facility], [address] [place], [place] [address]    Report Status 07/27/2019 FINAL  Final  Culture, blood (single)     Status: None   Collection Time: 07/22/19  9:30 PM   Specimen: BLOOD RIGHT HAND  Result Value Ref Range Status   Specimen Description   Final    BLOOD RIGHT HAND Performed at [facility], [address] [place], [place] [address]    Special Requests   Final    BOTTLES DRAWN AEROBIC AND ANAEROBIC Blood Culture adequate volume Performed at [facility], [address] [place], [place] [address]    Culture   Final    NO GROWTH 5 DAYS Performed at [facility], [address], [place], [place] [address]    Report Status 07/27/2019 FINAL  Final    Radiology Reports MR TIBIA FIBULA RIGHT WO CONTRAST  Result Date: 07/23/2019 CLINICAL DATA:  Right knee and lower leg swelling. History of IV drug abuse and COVID-19 infection. Clinical concern for infection. EXAM: MRI OF LOWER RIGHT EXTREMITY WITHOUT CONTRAST  TECHNIQUE: Multiplanar, multisequence MR imaging of the right lower leg was performed. No intravenous contrast was administered. COMPARISON:  Radiographs 07/22/2019 and 01/08/2019. FINDINGS: Bones/Joint/Cartilage Both lower legs are included on the coronal images. Examination extends from just above the knee through the distal lower leg. The ankle is not included. There is no evidence of acute fracture, dislocation or bone destruction. The right tibia and fibula appear normal. No significant knee joint effusion or arthropathy. Ligaments Not relevant for exam/indication. The cruciate ligaments appear intact at the knee. Muscles and Tendons No focal intramuscular fluid collection or significant muscular edema. The patellar tendon and visualized  Achilles tendon appear normal. Soft tissues There is extensive subcutaneous edema and ill-defined fluid medially, extending from just above the knee into the distal lower leg. Ill-defined fluid tracks between the medial head of the gastrocnemius muscle and the soleus muscle, measuring up to 6 mm in thickness. No focal fluid collection identified on noncontrast imaging. No evidence of foreign body or soft tissue emphysema. No gross vascular abnormality. IMPRESSION: 1. Extensive subcutaneous edema and ill-defined fluid medially in the right lower leg, extending from just above the knee into the distal lower leg. Findings are consistent with cellulitis and possible fasciitis. No focal fluid collection or intramuscular abnormality identified. 2. No evidence of osteomyelitis or septic joint at the right knee. Electronically Signed   By: Carey Bullocks M.D.   On: 07/23/2019 11:11   US Venous Img Lower Right (DVT Study)  Result Date: 07/22/2019 CLINICAL DATA:  Right leg swelling EXAM: RIGHT LOWER EXTREMITY VENOUS DOPPLER ULTRASOUND TECHNIQUE: Gray-scale sonography with compression, as well as color and duplex ultrasound, were performed to evaluate the deep venous system(s) from the level of the common femoral vein through the popliteal and proximal calf veins. COMPARISON:  None. FINDINGS: VENOUS Normal compressibility of the common femoral, superficial femoral and popliteal veins. The calf veins are limited in visualization secondary to surrounding soft tissue edema. Visualized portions of profunda femoral vein and great saphenous vein unremarkable. No filling defects to suggest DVT on grayscale or color Doppler imaging. Doppler waveforms show normal direction of venous flow, normal respiratory phasicity and response to augmentation. Limited views of the contralateral common femoral vein are unremarkable. OTHER None. Limitations: none IMPRESSION: Limited evaluation of the calf veins, as described above, without  evidence of right lower extremity DVT. If clinical symptoms are inconsistent or if there are persistent or worsening symptoms, further imaging (possibly involving the iliac veins) may be warranted. Electronically Signed   By: Aram Candela M.D.   On: 07/22/2019 21:24   DG CHEST PORT 1 VIEW  Result Date: 07/29/2019 CLINICAL DATA:  Right leg swelling. Ex-smoker. EXAM: PORTABLE CHEST 1 VIEW COMPARISON:  07/23/2018 FINDINGS: Interval mildly enlarged cardiac silhouette. Clear lungs with normal vascularity. Normal appearing bones. IMPRESSION: Interval mild cardiomegaly. No acute abnormality. Electronically Signed   By: Beckie Salts M.D.   On: 07/29/2019 19:37   DG CHEST PORT 1 VIEW  Result Date: 07/23/2019 CLINICAL DATA:  Right knee infection, COVID positive EXAM: PORTABLE CHEST 1 VIEW COMPARISON:  01/05/2019 FINDINGS: The heart size and mediastinal contours are within normal limits. Both lungs are clear. The visualized skeletal structures are unremarkable. IMPRESSION: No active disease. Electronically Signed   By: Jasmine Pang M.D.   On: 07/23/2019 01:15   DG Knee Complete 4 Views Right  Result Date: 07/22/2019 CLINICAL DATA:  Leg swelling.  IV drug use. EXAM: RIGHT KNEE - COMPLETE 4+ VIEW COMPARISON:  None. FINDINGS: No  bony abnormalities. No fracture or effusion. No bony lesions. Soft tissue edema identified. IMPRESSION: Soft tissue edema/swelling.  No other abnormalities. Electronically Signed   By: Gerome Sam III M.D   On: 07/22/2019 20:47   DG Tibia/Fibula Right Port  Result Date: 07/29/2019 CLINICAL DATA:  Cellulitis right tib fib. EXAM: PORTABLE RIGHT TIBIA AND FIBULA - 2 VIEW COMPARISON:  MRI 07/23/2019. FINDINGS: Cortical margins of the tibia and fibular intact. No erosion or periosteal reaction. There is no evidence of fracture or other focal bone lesions. Knee and ankle alignment are maintained. Diffuse subcutaneous edema of the lower extremity. No soft tissue air. No radiopaque  foreign body. IMPRESSION: Diffuse subcutaneous edema of the lower extremity. No soft tissue air or osseous abnormality. Electronically Signed   By: Narda Rutherford M.D.   On: 07/29/2019 23:59   VAS [place] LOWER EXTREMITY VENOUS (DVT)  Result Date: 07/29/2019  Lower Venous DVTStudy Indications: Pain, Swelling, Edema, Erythema, and Recent diagnosis of Covid and of cellulitis, failed antibiotics.  Comparison Study: Prior study from 07/22/19 is available for comparison Performing Technologist: Sherren Kerns RVS  Examination Guidelines: A complete evaluation includes B-mode imaging, spectral Doppler, color Doppler, and power Doppler as needed of all accessible portions of each vessel. Bilateral testing is considered an integral part of a complete examination. Limited examinations for reoccurring indications may be performed as noted. The reflux portion of the exam is performed with the patient in reverse Trendelenburg.  +---------+---------------+---------+-----------+----------+-------------------+ RIGHT    CompressibilityPhasicitySpontaneityPropertiesThrombus Aging      +---------+---------------+---------+-----------+----------+-------------------+ CFV      Full           Yes      No                                       +---------+---------------+---------+-----------+----------+-------------------+ SFJ      Full                    Yes                                      +---------+---------------+---------+-----------+----------+-------------------+ FV Prox  Full                                                             +---------+---------------+---------+-----------+----------+-------------------+ FV Mid                                                patent by color and                                                       Doppler             +---------+---------------+---------+-----------+----------+-------------------+ FV Distal  patent by color and                                                       Doppler             +---------+---------------+---------+-----------+----------+-------------------+ PFV                                                   patent by color and                                                       Doppler             +---------+---------------+---------+-----------+----------+-------------------+ POP      Full           Yes      Yes                                      +---------+---------------+---------+-----------+----------+-------------------+ PTV      Full                                                             +---------+---------------+---------+-----------+----------+-------------------+ PERO     Full                                                             +---------+---------------+---------+-----------+----------+-------------------+ Interstitial fluid noted throughout  +----+---------------+---------+-----------+----------+--------------+ LEFTCompressibilityPhasicitySpontaneityPropertiesThrombus Aging +----+---------------+---------+-----------+----------+--------------+ CFV Full           Yes      Yes                                 +----+---------------+---------+-----------+----------+--------------+     Summary: RIGHT: - There is no evidence of deep vein thrombosis in the lower extremity.  - Ultrasound characteristics of enlarged lymph nodes are noted in the groin.  LEFT: - No evidence of common femoral vein obstruction. - Ultrasound characteristics of enlarged lymph nodes noted in the groin.  *See table(s) above for measurements and observations.    Preliminary      Time Spent in minutes  30     Desiree Hane M.D on 07/30/2019 at 8:01 AM  To page go to [email or web address] - password [facility]

## 2019-07-30 NOTE — Progress Notes (Signed)
   07/30/19 1939 07/30/19 1940  Vitals  BP (!) 152/102 (!) 158/100  MAP (mmHg) 114 116  BP Location Left Arm Right Arm  BP Method Automatic Automatic  Patient Position (if appropriate) Lying Lying  Pulse Rate 75 73   Paged on call coverage via Amion regarding blood pressure. Awaiting orders.

## 2019-07-31 DIAGNOSIS — F1990 Other psychoactive substance use, unspecified, uncomplicated: Secondary | ICD-10-CM

## 2019-07-31 LAB — CBC
HCT: 35.9 % — ABNORMAL LOW (ref 39.0–52.0)
Hemoglobin: 11.3 g/dL — ABNORMAL LOW (ref 13.0–17.0)
MCH: 28.3 pg (ref 26.0–34.0)
MCHC: 31.5 g/dL (ref 30.0–36.0)
MCV: 89.8 fL (ref 80.0–100.0)
Platelets: 453 10*3/uL — ABNORMAL HIGH (ref 150–400)
RBC: 4 MIL/uL — ABNORMAL LOW (ref 4.22–5.81)
RDW: 14.4 % (ref 11.5–15.5)
WBC: 8.4 10*3/uL (ref 4.0–10.5)
nRBC: 0 % (ref 0.0–0.2)

## 2019-07-31 LAB — MAGNESIUM: Magnesium: 1.7 mg/dL (ref 1.7–2.4)

## 2019-07-31 MED ORDER — ADULT MULTIVITAMIN W/MINERALS CH
1.0000 | ORAL_TABLET | Freq: Every day | ORAL | Status: DC
  Administered 2019-07-31 – 2019-08-03 (×4): 1 via ORAL
  Filled 2019-07-31 (×4): qty 1

## 2019-07-31 MED ORDER — PRO-STAT SUGAR FREE PO LIQD
30.0000 mL | Freq: Two times a day (BID) | ORAL | Status: DC
  Administered 2019-08-01 – 2019-08-02 (×3): 30 mL via ORAL
  Filled 2019-07-31 (×3): qty 30

## 2019-07-31 NOTE — TOC Progression Note (Signed)
Transition of Care [facility]) - Progression Note    Patient Details  Name: Grant Tapia MRN: [id number] Date of Birth: 1985/11/13  Transition of [facility]) CM/SW Contact  Geni Bers, RN Phone Number: 07/31/2019, 4:04 PM  Clinical Narrative:     Spoke with pt concerning Outpatient Substance Use Treatment Services. Explained to him that he will need to reach out and call for himself one of the places listed on the information sheets that I will send him. Pt was in agreement. Will fax to [facility] for RN to give to pt.   Expected Discharge Plan: Home/Self Care Barriers to Discharge: No Barriers Identified  Expected Discharge Plan and Services Expected Discharge Plan: Home/Self Care       Living arrangements for the past 2 months: Single Family Home                                       Social Determinants of Health (SDOH) Interventions    Readmission Risk Interventions Readmission Risk Prevention Plan 04/18/2019  Transportation Screening Complete  PCP or Specialist Appt within 5-7 Days Complete  Home Care Screening Complete  Medication Review (RN CM) Referral to Pharmacy  Some recent data might be hidden

## 2019-07-31 NOTE — Progress Notes (Signed)
[facility]  PROGRESS NOTE  Grant Tapia ZOX:[id number] DOB: 01/02/1986 DOA: 07/29/2019 PCP: Patient, No Pcp Per Admit date - 07/29/2019   Admitting Physician Therisa Doyne, MD  Outpatient Primary MD for the patient is Patient, No Pcp Per  LOS - 2 Brief Narrative   Grant Tapia is a 34 y.o. year old male with medical history significant for IVDU, recent hospitalization from 4/10-4/14 for right knee cellulitis with joint effusion (dry tap by ED, and Ortho agreed with medical management) he was discharged on empiric doxycycline and augmentin after septic joint/DVT/endocarditis ruled out on MRI/venous duplex/TTE  g, Recent covid diagonosis during last hospitalizationwho presented on 07/29/2019 with complaints of "infection not getting any better" with worsening redness, swelling, and pain despite adherence to antibiotics.  He was found to have tmax of 100, WBC of 28.6, CRP of 6.9 ( 10.9 on 4/14)and lactic acid 1.1, procalcitonin0.51.    Subjective  Today he is feeling much better, still having some pain in calf.  No nausea, no fever, no chills  A & P    Sepsis secondary to Nonpurulent cellulitis of right leg/knee, improving.  White count has gone down significantly from 28 to 11 to 8.4, swelling and redness has continued to decrease, with only swelling in the right calf, x-ray shows subcutaneous edema but no soft tissue air/osseous abnormalities.  Evaluated by Ortho who agrees continue antibiotic regimen and no current need for surgical intervention or MRI imaging given significant improvement in symptoms and sepsis parameters.  Given failed outpatient therapy in setting of continued IV drug use, patient warrants continue IV antibiotics, will continue to monitor blood cultures, TTE with no vegetations -Monitor blood cultures --ortho consulted, appreciate recommendations to continue antibiotics --empiric vancomycin and ceftriaxone, Flagyl.  Anticipate discontinuing  vancomycin and only continuing coverage for strep species given nonpurulent cellulitis in 24 hours if cultures remain unremarkable  IVDU. Still using cocaine. States wants to abstain. -Started on Suboxone protocol on admission -Consult TOC to assist with substance abuse programs/resources as outpatient  COVID 19 infection.  Diagnosed 07/22/2019.  Remains asymptomatic, no cough Nonacute findings on chest x-ray hypoxia.  D-dimer 2.15, LDH and ferritin unremarkable --airborne/contact precautions -Continue close monitor, does not meet criteria to start treatment  HTN.  Blood pressure is currently at goal.  Listed as having amlodipine on med rec -Continue closely monitor  Hypomagnesemia.  1.6 on admission. -IV repletion, 1.7 on repeat   Suspect poor nutrition.  Albumin 2.2.  Prealbumin 12.3 -nutrition consulted   Family Communication  : None  Code Status : Full  Disposition Plan  :  Patient is from home. Anticipated d/c date: 2 to 3 days. Barriers to d/c or necessity for inpatient status:  IV antibiotics for severe cellulitis, Anticipate discontinuing vancomycin and only continuing coverage for strep species given nonpurulent cellulitis in 24 hours if cultures remain unremarkable Consults  : Orthopedics  Procedures  : None  DVT Prophylaxis  : SCDs  Lab Results  Component Value Date   PLT 453 (H) 07/31/2019    Diet :  Diet Order            Diet regular Room service appropriate? Yes; Fluid consistency: Thin  Diet effective now               Inpatient Medications Scheduled Meds: . buprenorphine-naloxone  1 tablet Sublingual BID  . feeding supplement (PRO-STAT SUGAR FREE 64)  30 mL Oral BID  . multivitamin with minerals  1 tablet Oral Daily  .  sodium chloride flush  3 mL Intravenous Q12H  . sodium chloride flush  3 mL Intravenous Q12H   Continuous Infusions: . sodium chloride    . sodium chloride Stopped (07/30/19 1555)  . cefTRIAXone (ROCEPHIN)  IV 2 g (07/31/19  1533)  . metronidazole 500 mg (07/31/19 1935)  . vancomycin 1,750 mg (07/31/19 1853)   PRN Meds:.sodium chloride, sodium chloride, hydrOXYzine, ibuprofen, loperamide, ondansetron **OR** ondansetron (ZOFRAN) IV, sodium chloride flush, traMADol  Antibiotics  :   Anti-infectives (From admission, onward)   Start     Dose/Rate Route Frequency Ordered Stop   07/30/19 1600  cefTRIAXone (ROCEPHIN) 2 g in sodium chloride 0.9 % 100 mL IVPB     2 g 200 mL/hr over 30 Minutes Intravenous Every 24 hours 07/30/19 0520     07/30/19 0600  vancomycin (VANCOREADY) IVPB 1750 mg/350 mL     1,750 mg 175 mL/hr over 120 Minutes Intravenous Every 12 hours 07/30/19 0020     07/29/19 1930  metroNIDAZOLE (FLAGYL) IVPB 500 mg     500 mg 100 mL/hr over 60 Minutes Intravenous Every 8 hours 07/29/19 1928     07/29/19 1730  vancomycin (VANCOREADY) IVPB 1750 mg/350 mL     1,750 mg 175 mL/hr over 120 Minutes Intravenous  Once 07/29/19 1625 07/29/19 2123   07/29/19 1615  cefTRIAXone (ROCEPHIN) 2 g in sodium chloride 0.9 % 100 mL IVPB     2 g 200 mL/hr over 30 Minutes Intravenous  Once 07/29/19 1554 07/29/19 1841   07/29/19 1600  vancomycin (VANCOCIN) IVPB 1000 mg/200 mL premix  Status:  Discontinued     1,000 mg 200 mL/hr over 60 Minutes Intravenous  Once 07/29/19 1554 07/29/19 1625       Objective   Vitals:   07/31/19 0619 07/31/19 1423 07/31/19 1815 07/31/19 1900  BP: 123/83 (!) 139/96 (!) 142/99   Pulse: 63 76  77  Resp: Temp: 98.3 F (36.8 C) 97.9 F (36.6 C)    TempSrc: Oral Axillary    SpO2: 100% 100%  100%  Weight:      Height:        SpO2: 100 %  Wt Readings from Last 3 Encounters:  07/29/19 77.1 kg  07/22/19 77.1 kg  04/16/19 83.9 kg     Intake/Output Summary (Last 24 hours) at 07/31/2019 2057 Last data filed at 07/31/2019 1937 Gross per 24 hour  Intake 1622.3 ml  Output 2850 ml  Net -1227.7 ml    Physical Exam:  Awake Alert, Oriented X 3, Normal affect No new F.N  deficits,  [place].AT, Normal respiratory effort on room air, CTAB RRR,No Gallops,Rubs or new Murmurs, Slightly erythematous right knee (decreased from prior exam), swelling of right calf and slightly tender but not fluctuant +ve B.Sounds, Abd Soft, No tenderness, No rebound, guarding or rigidity. No Cyanosis, No new Rash or bruise     I have personally reviewed the following:   Data Reviewed:  CBC Recent Labs  Lab 07/25/19 0720 07/26/19 0453 07/29/19 1543 07/30/19 0454 07/31/19 0406  WBC 27.8* 24.0* 15.9* 11.0* 8.4  HGB 10.9* 12.0* 12.0* 10.8* 11.3*  HCT 34.2* 38.2* 38.0* 34.0* 35.9*  PLT 417* 540* 464* 482* 453*  MCV 87.7 90.7 90.3 87.4 89.8  MCH 27.9 28.5 28.5 27.8 28.3  MCHC 31.9 31.4 31.6 31.8 31.5  RDW 15.0 15.2 14.6 14.5 14.4  LYMPHSABS  --   --  2.8 2.2  --   MONOABS  --   --  0.7 0.5  --   EOSABS  --   --  0.8* 0.5  --   BASOSABS  --   --  0.1 0.1  --     Chemistries  Recent Labs  Lab 07/25/19 0446 07/25/19 0720 07/26/19 0453 07/29/19 1543 07/29/19 1653 07/29/19 2315 07/30/19 0454 07/31/19 0406  NA  --  139 141 140  --   --  141  --   K  --  3.4* 3.7 3.0*  --   --  4.0  --   CL  --  108 110 105  --   --  108  --   CO2  --  21* 22 27  --   --  25  --   GLUCOSE  --  129* 140* 93  --   --  112*  --   BUN  --  18 18 10   --   --  9  --   CREATININE 1.01 1.03 1.00 0.79  --   --  1.00  --   CALCIUM  --  8.3* 8.5* 8.5*  --   --  8.4*  --   MG  --   --  1.7  --   --  1.6* 1.6* 1.7  AST  --  16  --  22 19  --  19  --   ALT  --  16  --  18 17  --  16  --   ALKPHOS  --  79  --  54 46  --  46  --   BILITOT  --  0.5  --  0.4 0.4  --  0.3  --    ------------------------------------------------------------------------------------------------------------------ No results for input(s): CHOL, HDL, LDLCALC, TRIG, CHOLHDL, LDLDIRECT in the last 72 hours.  No results found for:  HGBA1C ------------------------------------------------------------------------------------------------------------------ Recent Labs    07/30/19 0454  TSH 1.424   ------------------------------------------------------------------------------------------------------------------ Recent Labs    07/29/19 1917  FERRITIN 141    Coagulation profile Recent Labs  Lab 07/29/19 1543  INR 1.1    Recent Labs    07/29/19 2315  DDIMER 2.15*    Cardiac Enzymes No results for input(s): CKMB, TROPONINI, MYOGLOBIN in the last 168 hours.  Invalid input(s): CK ------------------------------------------------------------------------------------------------------------------ No results found for: BNP  Micro Results Recent Results (from the past 240 hour(s))  Respiratory Panel by RT PCR (Flu A&B, Covid) - Nasopharyngeal Swab     Status: Abnormal   Collection Time: 07/22/19  9:09 PM   Specimen: Nasopharyngeal Swab  Result Value Ref Range Status   SARS Coronavirus 2 by RT PCR POSITIVE (A) NEGATIVE Final    Comment: RESULT CALLED TO, READ BACK BY AND VERIFIED WITH: MAYNARD,C AT 0022 ON 09/21/19 BY CHERESNOWSKY,T (NOTE) SARS-CoV-2 target nucleic acids are DETECTED. SARS-CoV-2 RNA is generally detectable in upper respiratory specimens  during the acute phase of infection. Positive results are indicative of the presence of the identified virus, but do not rule out bacterial infection or co-infection with other pathogens not detected by the test. Clinical correlation with patient history and other diagnostic information is necessary to determine patient infection status. The expected result is Negative. Fact Sheet for Patients:  308657 Fact Sheet for Healthcare Providers: https://www.moore.com/ This test is not yet approved or cleared by the https://www.young.biz/ FDA and  has been authorized for detection and/or diagnosis of SARS-CoV-2 by FDA  under an Emergency Use Authorization (EUA).  This EUA will remain in effect (meaning this test can  be  used) for the duration of  the COVID-19 declaration under Section 564(b)(1) of the Act, 21 U.S.C. section 360bbb-3(b)(1), unless the authorization is terminated or revoked sooner.    Influenza A by PCR NEGATIVE NEGATIVE Final   Influenza B by PCR NEGATIVE NEGATIVE Final    Comment: (NOTE) The Xpert Xpress SARS-CoV-2/FLU/RSV assay is intended as an aid in  the diagnosis of influenza from Nasopharyngeal swab specimens and  should not be used as a sole basis for treatment. Nasal washings and  aspirates are unacceptable for Xpert Xpress SARS-CoV-2/FLU/RSV  testing. Fact Sheet for Patients: https://www.moore.com/https://www.fda.gov/media/142436/download Fact Sheet for Healthcare Providers: https://www.young.biz/https://www.fda.gov/media/142435/download This test is not yet approved or cleared by the [place] FDA and  has been authorized for detection and/or diagnosis of SARS-CoV-2 by  FDA under an Emergency Use Authorization (EUA). This EUA will remain  in effect (meaning this test can be used) for the duration of the  Covid-19 declaration under Section 564(b)(1) of the Act, 21  U.S.C. section 360bbb-3(b)(1), unless the authorization is  terminated or revoked. Performed at [facility] [place], [address] [place], [place] [address]   Culture, blood (routine x 2)     Status: None   Collection Time: 07/22/19  9:15 PM   Specimen: BLOOD LEFT ARM  Result Value Ref Range Status   Specimen Description   Final    BLOOD LEFT ARM Performed at [facility] [place], [address] [place], [place] [address]    Special Requests   Final    BOTTLES DRAWN AEROBIC AND ANAEROBIC Blood Culture adequate volume Performed at [facility] [place], [address] [place], [place] [address]    Culture   Final    NO GROWTH 5 DAYS Performed at [facility], [address] [place], [place] [address]    Report Status  07/27/2019 FINAL  Final  Culture, blood (routine x 2)     Status: None   Collection Time: 07/22/19  9:25 PM   Specimen: BLOOD RIGHT ARM  Result Value Ref Range Status   Specimen Description   Final    BLOOD RIGHT ARM Performed at [facility] [place], [address] [place], [place] [address]    Special Requests   Final    BOTTLES DRAWN AEROBIC AND ANAEROBIC Blood Culture results may not be optimal due to an inadequate volume of blood received in culture bottles Performed at [facility] [place], [address] [place], [place] [address]    Culture   Final    NO GROWTH 5 DAYS Performed at [facility], [address] [place], [place] [address]    Report Status 07/27/2019 FINAL  Final  Culture, blood (single)     Status: None   Collection Time: 07/22/19  9:30 PM   Specimen: BLOOD RIGHT HAND  Result Value Ref Range Status   Specimen Description   Final    BLOOD RIGHT HAND Performed at [facility] [place], [address] [place], [place] [address]    Special Requests   Final    BOTTLES DRAWN AEROBIC AND ANAEROBIC Blood Culture adequate volume Performed at [facility] [place], [address] [place], [place] [address]    Culture   Final    NO GROWTH 5 DAYS Performed at [facility], [address] [place], [place] [address]    Report Status 07/27/2019 FINAL  Final  Blood culture (routine x  2)     Status: None (Preliminary result)   Collection Time: 07/29/19  3:51 PM   Specimen: BLOOD LEFT FOREARM  Result Value Ref Range Status   Specimen Description   Final    BLOOD LEFT FOREARM Performed at [facility], [address], [place], [place] [address]    Special Requests   Final    BOTTLES DRAWN AEROBIC AND ANAEROBIC Blood Culture results may not be optimal due to an inadequate volume of blood received in culture bottles Performed at [facility], [address], [place], [place] [address]    Culture   Final     NO GROWTH 2 DAYS Performed at [facility], [address] [place], [place] [address]    Report Status PENDING  Incomplete  Blood culture (routine x 2)     Status: None (Preliminary result)   Collection Time: 07/29/19  3:56 PM   Specimen: BLOOD  Result Value Ref Range Status   Specimen Description   Final    BLOOD LEFT ANTECUBITAL Performed at [facility], [address], [place], [place] [address]    Special Requests   Final    BOTTLES DRAWN AEROBIC AND ANAEROBIC Blood Culture adequate volume Performed at [facility], [address] [place], [place] [address]    Culture   Final    NO GROWTH 2 DAYS Performed at [facility], [address] [place], [place] [address]    Report Status PENDING  Incomplete    Radiology Reports MR TIBIA FIBULA RIGHT WO CONTRAST  Result Date: 07/23/2019 CLINICAL DATA:  Right knee and lower leg swelling. History of IV drug abuse and COVID-19 infection. Clinical concern for infection. EXAM: MRI OF LOWER RIGHT EXTREMITY WITHOUT CONTRAST TECHNIQUE: Multiplanar, multisequence MR imaging of the right lower leg was performed. No intravenous contrast was administered. COMPARISON:  Radiographs 07/22/2019 and 01/08/2019. FINDINGS: Bones/Joint/Cartilage Both lower legs are included on the coronal images. Examination extends from just above the knee through the distal lower leg. The ankle is not included. There is no evidence of acute fracture, dislocation or bone destruction. The right tibia and fibula appear normal. No significant knee joint effusion or arthropathy. Ligaments Not relevant for exam/indication. The cruciate ligaments appear intact at the knee. Muscles and Tendons No focal intramuscular fluid collection or significant muscular edema. The patellar tendon and visualized Achilles tendon appear normal. Soft tissues There is extensive subcutaneous edema and ill-defined fluid medially, extending from just  above the knee into the distal lower leg. Ill-defined fluid tracks between the medial head of the gastrocnemius muscle and the soleus muscle, measuring up to 6 mm in thickness. No focal fluid collection identified on noncontrast imaging. No evidence of foreign body or soft tissue emphysema. No gross vascular abnormality. IMPRESSION: 1. Extensive subcutaneous edema and ill-defined fluid medially in the right lower leg, extending from just above the knee into the distal lower leg. Findings are consistent with cellulitis and possible fasciitis. No focal fluid collection or intramuscular abnormality identified. 2. No evidence of osteomyelitis or septic joint at the right knee. Electronically Signed   By: Carey Bullocks M.D.   On: 07/23/2019 11:11   US Venous Img Lower Right (DVT Study)  Result Date: 07/22/2019 CLINICAL DATA:  Right leg swelling EXAM: RIGHT LOWER EXTREMITY VENOUS DOPPLER ULTRASOUND TECHNIQUE: Gray-scale sonography with compression, as well as color and duplex ultrasound, were performed to evaluate the deep venous system(s) from the level  of the common femoral vein through the popliteal and proximal calf veins. COMPARISON:  None. FINDINGS: VENOUS Normal compressibility of the common femoral, superficial femoral and popliteal veins. The calf veins are limited in visualization secondary to surrounding soft tissue edema. Visualized portions of profunda femoral vein and great saphenous vein unremarkable. No filling defects to suggest DVT on grayscale or color Doppler imaging. Doppler waveforms show normal direction of venous flow, normal respiratory phasicity and response to augmentation. Limited views of the contralateral common femoral vein are unremarkable. OTHER None. Limitations: none IMPRESSION: Limited evaluation of the calf veins, as described above, without evidence of right lower extremity DVT. If clinical symptoms are inconsistent or if there are persistent or worsening symptoms, further  imaging (possibly involving the iliac veins) may be warranted. Electronically Signed   By: Aram Candela M.D.   On: 07/22/2019 21:24   DG CHEST PORT 1 VIEW  Result Date: 07/29/2019 CLINICAL DATA:  Right leg swelling. Ex-smoker. EXAM: PORTABLE CHEST 1 VIEW COMPARISON:  07/23/2018 FINDINGS: Interval mildly enlarged cardiac silhouette. Clear lungs with normal vascularity. Normal appearing bones. IMPRESSION: Interval mild cardiomegaly. No acute abnormality. Electronically Signed   By: Beckie Salts M.D.   On: 07/29/2019 19:37   DG CHEST PORT 1 VIEW  Result Date: 07/23/2019 CLINICAL DATA:  Right knee infection, COVID positive EXAM: PORTABLE CHEST 1 VIEW COMPARISON:  01/05/2019 FINDINGS: The heart size and mediastinal contours are within normal limits. Both lungs are clear. The visualized skeletal structures are unremarkable. IMPRESSION: No active disease. Electronically Signed   By: Jasmine Pang M.D.   On: 07/23/2019 01:15   DG Knee Complete 4 Views Right  Result Date: 07/22/2019 CLINICAL DATA:  Leg swelling.  IV drug use. EXAM: RIGHT KNEE - COMPLETE 4+ VIEW COMPARISON:  None. FINDINGS: No bony abnormalities. No fracture or effusion. No bony lesions. Soft tissue edema identified. IMPRESSION: Soft tissue edema/swelling.  No other abnormalities. Electronically Signed   By: Gerome Sam III M.D   On: 07/22/2019 20:47   DG Tibia/Fibula Right Port  Result Date: 07/29/2019 CLINICAL DATA:  Cellulitis right tib fib. EXAM: PORTABLE RIGHT TIBIA AND FIBULA - 2 VIEW COMPARISON:  MRI 07/23/2019. FINDINGS: Cortical margins of the tibia and fibular intact. No erosion or periosteal reaction. There is no evidence of fracture or other focal bone lesions. Knee and ankle alignment are maintained. Diffuse subcutaneous edema of the lower extremity. No soft tissue air. No radiopaque foreign body. IMPRESSION: Diffuse subcutaneous edema of the lower extremity. No soft tissue air or osseous abnormality. Electronically  Signed   By: Narda Rutherford M.D.   On: 07/29/2019 23:59   VAS [place] LOWER EXTREMITY VENOUS (DVT)  Result Date: 07/30/2019  Lower Venous DVTStudy Indications: Pain, Swelling, Edema, Erythema, and Recent diagnosis of Covid and of cellulitis, failed antibiotics.  Comparison Study: Prior study from 07/22/19 is available for comparison Performing Technologist: Sherren Kerns RVS  Examination Guidelines: A complete evaluation includes B-mode imaging, spectral Doppler, color Doppler, and power Doppler as needed of all accessible portions of each vessel. Bilateral testing is considered an integral part of a complete examination. Limited examinations for reoccurring indications may be performed as noted. The reflux portion of the exam is performed with the patient in reverse Trendelenburg.  +---------+---------------+---------+-----------+----------+-------------------+ RIGHT    CompressibilityPhasicitySpontaneityPropertiesThrombus Aging      +---------+---------------+---------+-----------+----------+-------------------+ CFV      Full           Yes      No                                       +---------+---------------+---------+-----------+----------+-------------------+  SFJ      Full                    Yes                                      +---------+---------------+---------+-----------+----------+-------------------+ FV Prox  Full                                                             +---------+---------------+---------+-----------+----------+-------------------+ FV Mid                                                patent by color and                                                       Doppler             +---------+---------------+---------+-----------+----------+-------------------+ FV Distal                                             patent by color and                                                       Doppler              +---------+---------------+---------+-----------+----------+-------------------+ PFV                                                   patent by color and                                                       Doppler             +---------+---------------+---------+-----------+----------+-------------------+ POP      Full           Yes      Yes                                      +---------+---------------+---------+-----------+----------+-------------------+ PTV      Full                                                             +---------+---------------+---------+-----------+----------+-------------------+  PERO     Full                                                             +---------+---------------+---------+-----------+----------+-------------------+ Interstitial fluid noted throughout  +----+---------------+---------+-----------+----------+--------------+ LEFTCompressibilityPhasicitySpontaneityPropertiesThrombus Aging +----+---------------+---------+-----------+----------+--------------+ CFV Full           Yes      Yes                                 +----+---------------+---------+-----------+----------+--------------+     Summary: RIGHT: - There is no evidence of deep vein thrombosis in the lower extremity.  - Ultrasound characteristics of enlarged lymph nodes are noted in the groin.  LEFT: - No evidence of common femoral vein obstruction. - Ultrasound characteristics of enlarged lymph nodes noted in the groin.  *See table(s) above for measurements and observations. Electronically signed by Monica Martinez MD on 07/30/2019 at 3:03:08 PM.    Final    ECHOCARDIOGRAM LIMITED  Result Date: 07/30/2019    ECHOCARDIOGRAM LIMITED REPORT   Patient Name:   CORNEILUS HEGGIE Date of Exam: 07/30/2019 Medical Rec #:  [id number]              Height:       70.0 in Accession #:    [id number]             Weight:       170.0 lb Date of Birth:  Jun 01, 1985              BSA:          1.948 m Patient Age:    33 years               BP:           136/84 mmHg Patient Gender: M                      HR:           77 bpm. Exam Location:  Inpatient Procedure: Limited Echo, Limited Color Doppler and Cardiac Doppler Indications:    Fever R50.9; Cardiomegaly I51.7  History:        Patient has prior history of Echocardiogram examinations, most                 recent 04/15/2019. COVID-19 Positive; Risk Factors:IV Drug User.  Sonographer:    Mikki Santee RDCS (AE) Referring Phys: [place]  1. Left ventricular ejection fraction, by estimation, is 65 to 70%. The left ventricle has normal function. The left ventricle has no regional wall motion abnormalities. Left ventricular diastolic parameters were normal.  2. Right ventricular systolic function is normal. The right ventricular size is normal.  3. The mitral valve is grossly normal. Trivial mitral valve regurgitation. No evidence of mitral stenosis.  4. The aortic valve is tricuspid. Aortic valve regurgitation is not visualized. No aortic stenosis is present.  5. The inferior vena cava is normal in size with greater than 50% respiratory variability, suggesting right atrial pressure of 3 mmHg. Conclusion(s)/Recommendation(s): No evidence of valvular vegetations on this transthoracic echocardiogram. Would recommend a transesophageal echocardiogram to exclude infective endocarditis if clinically indicated. FINDINGS  Left Ventricle: Left ventricular ejection fraction, by  estimation, is 65 to 70%. The left ventricle has normal function. The left ventricle has no regional wall motion abnormalities. The left ventricular internal cavity size was normal in size. There is  no left ventricular hypertrophy. Right Ventricle: The right ventricular size is normal. No increase in right ventricular wall thickness. Right ventricular systolic function is normal. Left Atrium: Left atrial size was normal in size. Right Atrium:  Right atrial size was normal in size. Pericardium: Trivial pericardial effusion is present. Mitral Valve: The mitral valve is grossly normal. Trivial mitral valve regurgitation. No evidence of mitral valve stenosis. Tricuspid Valve: The tricuspid valve is grossly normal. Tricuspid valve regurgitation is not demonstrated. No evidence of tricuspid stenosis. Aortic Valve: The aortic valve is tricuspid. Aortic valve regurgitation is not visualized. No aortic stenosis is present. Pulmonic Valve: The pulmonic valve was grossly normal. Pulmonic valve regurgitation is not visualized. No evidence of pulmonic stenosis. Aorta: The aortic root is normal in size and structure. Venous: The inferior vena cava is normal in size with greater than 50% respiratory variability, suggesting right atrial pressure of 3 mmHg. IAS/Shunts: The atrial septum is grossly normal.  LEFT VENTRICLE PLAX 2D LVIDd:         5.10 cm Diastology LVIDs:         3.20 cm LV e' lateral:   16.40 cm/s LV PW:         0.80 cm LV E/e' lateral: 7.6 LV IVS:        0.90 cm LV e' medial:    12.60 cm/s                        LV E/e' medial:  9.8  LEFT ATRIUM           Index       RIGHT ATRIUM           Index LA diam:      3.60 cm 1.85 cm/m  RA Area:     13.90 cm LA Vol (A4C): 49.8 ml 25.56 ml/m RA Volume:   30.40 ml  15.61 ml/m   AORTA Ao Root diam: 2.60 cm MITRAL VALVE MV Area (PHT): 5.88 cm MV Decel Time: 129 msec MV E velocity: 124.00 cm/s MV A velocity: 93.00 cm/s MV E/A ratio:  1.33 Lennie Odor MD Electronically signed by Lennie Odor MD Signature Date/Time: 07/30/2019/11:31:31 AM    Final      Time Spent in minutes  30     Laverna Peace M.D on 07/31/2019 at 8:57 PM  To page go to [email or web address] - password [facility]

## 2019-07-31 NOTE — Progress Notes (Signed)
Initial Nutrition Assessment  RD working remotely.   DOCUMENTATION CODES:   Not applicable  INTERVENTION:  - will order Magic Cup BID with meals, each supplement provides 290 kcal and 9 grams of protein. - will order Hormel Shake once/day with breakfast meals, each supplement provides 500 kcal and 22 grams protein. - will order 30 mL Prostat BID, each supplement provides 100 kcal and 15 grams of protein. - will order daily multivitamin with minerals.    NUTRITION DIAGNOSIS:   Increased nutrient needs related to wound healing, acute illness, catabolic illness(COVID-19 infection) as evidenced by estimated needs.  GOAL:   Patient will meet greater than or equal to 90% of their needs  MONITOR:   PO intake, Supplement acceptance, Labs, Weight trends  REASON FOR ASSESSMENT:   Consult Assessment of nutrition requirement/status  ASSESSMENT:   34 y.o. year old male with medical history of IVDU and recent hospitalization 4/10-4/14 for R knee cellulitis with joint effusion (dry tap by ED, and Ortho agreed with medical management). He was discharged on empiric doxycycline and augmentin after septic joint/DVT/endocarditis ruled out on MRI/venous duplex/TTE. Recent COVID-19 dx. He presented to the ED on 4/17 complaining that knee infection was not improving and he was experiencing worsening redness, swelling, and pain despite taking abx.  He consumed 100% of lunch yesterday (1177 kcal, 38 grams protein) and 25% of breakfast this AM (259 kcal, 6 grams protein). Weight on 4/17 was 170 lb and weight on 01/05/19 was 184 lb. This indicates 14 lb weight loss (7.6% body weight) in the past 7 months; not significant for time frame.   Per notes: - sepsis 2/2 cellulitis of R leg and knee--improving - IVDU--uses cocaine, ongoing - COVID-19 infection - hypoalbuminemia   MD notes state poor nutrition suspected given albumin of 2.2 g/dl. CRP was 6.9 mg/dl on 7/79. Hesitant to link hypoalbuminemia to  nutrition given ongoing cellulitis and COVID-19 infection and in light of elevated CRP.   Labs reviewed; Ca: 8.4 mg/dl. Medications reviewed.    NUTRITION - FOCUSED PHYSICAL EXAM:  unable to complete at this time.   Diet Order:   Diet Order            Diet regular Room service appropriate? Yes; Fluid consistency: Thin  Diet effective now              EDUCATION NEEDS:   No education needs have been identified at this time  Skin:  Skin Assessment: Reviewed RN Assessment  Last BM:  4/18  Height:   Ht Readings from Last 1 Encounters:  07/29/19 5\' 10"  (1.778 m)    Weight:   Wt Readings from Last 1 Encounters:  07/29/19 77.1 kg    Ideal Body Weight:  75.4 kg  BMI:  Body mass index is 24.39 kg/m.  Estimated Nutritional Needs:   Kcal:  2300-2500 kcal  Protein:  120-135 grams  Fluid:  >/= 2.5 L/day     07/31/19, MS, RD, LDN, CNSC Inpatient Clinical Dietitian RD pager # available in AMION  After hours/weekend pager # available in [facility]

## 2019-08-01 ENCOUNTER — Encounter (HOSPITAL_COMMUNITY): Payer: Self-pay

## 2019-08-01 DIAGNOSIS — L03115 Cellulitis of right lower limb: Secondary | ICD-10-CM

## 2019-08-01 DIAGNOSIS — A419 Sepsis, unspecified organism: Principal | ICD-10-CM

## 2019-08-01 LAB — CREATININE, SERUM
Creatinine, Ser: 0.99 mg/dL (ref 0.61–1.24)
GFR calc Af Amer: >60 mL/min (ref >60)
GFR calc non Af Amer: >60 mL/min (ref >60)

## 2019-08-01 MED ORDER — SODIUM CHLORIDE 0.9 % IV SOLN: 2.0000 g | INTRAVENOUS | Status: DC

## 2019-08-01 NOTE — Progress Notes (Signed)
[facility]  PROGRESS NOTE  Grant Tapia ZOX:[id number] DOB: 1986/02/06 DOA: 07/29/2019 PCP: Patient, No Pcp Per Admit date - 07/29/2019   Admitting Physician Therisa Doyne, MD  Outpatient Primary MD for the patient is Patient, No Pcp Per  LOS - 3 Brief Narrative   Grant Tapia is a 34 y.o. year old male with medical history significant for IVDU, recent hospitalization from 4/10-4/14 for right knee cellulitis with joint effusion (dry tap by ED, and Ortho agreed with medical management) he was discharged on empiric doxycycline and augmentin after septic joint/DVT/endocarditis ruled out on MRI/venous duplex/TTE  g, Recent covid diagonosis during last hospitalizationwho presented on 07/29/2019 with complaints of "infection not getting any better" with worsening redness, swelling, and pain despite reported adherence to doxycycline on discharge.  He was found to have tmax of 100, WBC of 28.6, CRP of 6.9 ( 10.9 on 4/14)and lactic acid 1.1, procalcitonin0.51 and admitted with diagnosis of sepsis secondary to right leg/knee cellulitis.  Hospital course.  Patient was evaluated by orthopedics who reviewed x-ray which showed subcutaneous edema but no soft tissue air/osseous abnormalities.  They did not recommend surgical intervention and do not recommend further MRI imaging given significant improvement in his symptoms within 24 hours and resolution of sepsis parameters on IV antibiotics.  Patient's white count has gone down from 28-11 within 24 hours of admission.    Subjective  Today continues to report improvement and right legs pain however he does notice persistent pain and swelling in right calf.  Denies any fevers or chills.  A & P    Sepsis secondary to Nonpurulent cellulitis of right leg/knee, improving.  White count has gone down significantly from 28 to 11 to 8.4, swelling and redness has continued to decrease, remains afebrile, blood cultures unremarkable, TTE, no  vegetations. Only swelling in the right calf that persists.  Given failed outpatient treatment with doxycycline I suspect he will need to continue IV vancomycin or potentially consider oral option with linezolid.  Is also possible patient likely had strep etiology for infection given his cellulitis is nonpurulent and response could be to ceftriaxone.  Before changing antibiotics I want to assess that there is no DVT given persistent swelling -Obtain venous duplex   --Given failed outpatient therapy in setting of continued IV drug use, patient warrants continue IV antibiotics -Monitor blood cultures --ortho consulted, appreciate recommendations to continue antibiotics --empiric vancomycin and ceftriaxone, d/c Flagyl.    IVDU. Still using cocaine. States wants to abstain. -Started on Suboxone protocol on admission -Consult TOC to assist with substance abuse programs/resources as outpatient  COVID 19 infection.  Diagnosed 07/22/2019.  Remains asymptomatic, no cough Nonacute findings on chest x-ray hypoxia.  D-dimer 2.15, LDH and ferritin unremarkable --airborne/contact precautions -Continue close monitor, does not meet criteria to start treatment  HTN.  Blood pressure is currently at goal.  Listed as having amlodipine on med rec -Continue closely monitor  Hypomagnesemia.  1.6 on admission. -IV repletion, 1.7 on repeat   Suspect poor nutrition.  Albumin 2.2.  Prealbumin 12.3 -nutrition consulted   Family Communication  : None  Code Status : Full  Disposition Plan  :  Patient is from home. Anticipated d/c date: 2 to 3 days. Barriers to d/c or necessity for inpatient status:  IV antibiotics for severe cellulitis, Anticipate discontinuing vancomycin and only continuing coverage for strep species given nonpurulent cellulitis in 24 hours if cultures remain unremarkable Consults  : Orthopedics  Procedures  : None, venous duplex  pending  DVT Prophylaxis  : SCDs  Lab Results  Component  Value Date   PLT 453 (H) 07/31/2019    Diet :  Diet Order            Diet regular Room service appropriate? Yes; Fluid consistency: Thin  Diet effective now               Inpatient Medications Scheduled Meds: . buprenorphine-naloxone  1 tablet Sublingual BID  . feeding supplement (PRO-STAT SUGAR FREE 64)  30 mL Oral BID  . multivitamin with minerals  1 tablet Oral Daily  . sodium chloride flush  3 mL Intravenous Q12H  . sodium chloride flush  3 mL Intravenous Q12H   Continuous Infusions: . sodium chloride    . sodium chloride Stopped (07/30/19 1555)  . cefTRIAXone (ROCEPHIN)  IV 2 g (08/01/19 1642)  . vancomycin Stopped (08/01/19 0801)   PRN Meds:.sodium chloride, sodium chloride, hydrOXYzine, ibuprofen, loperamide, ondansetron **OR** ondansetron (ZOFRAN) IV, sodium chloride flush, traMADol  Antibiotics  :   Anti-infectives (From admission, onward)   Start     Dose/Rate Route Frequency Ordered Stop   08/01/19 0920  cefTRIAXone (ROCEPHIN) 2 g in sodium chloride 0.9 % 100 mL IVPB  Status:  Discontinued     2 g 200 mL/hr over 30 Minutes Intravenous Every 24 hours 08/01/19 0921 08/01/19 0922   07/30/19 1600  cefTRIAXone (ROCEPHIN) 2 g in sodium chloride 0.9 % 100 mL IVPB     2 g 200 mL/hr over 30 Minutes Intravenous Every 24 hours 07/30/19 0520     07/30/19 0600  vancomycin (VANCOREADY) IVPB 1750 mg/350 mL     1,750 mg 175 mL/hr over 120 Minutes Intravenous Every 12 hours 07/30/19 0020     07/29/19 1930  metroNIDAZOLE (FLAGYL) IVPB 500 mg  Status:  Discontinued     500 mg 100 mL/hr over 60 Minutes Intravenous Every 8 hours 07/29/19 1928 08/01/19 0806   07/29/19 1730  vancomycin (VANCOREADY) IVPB 1750 mg/350 mL     1,750 mg 175 mL/hr over 120 Minutes Intravenous  Once 07/29/19 1625 07/29/19 2123   07/29/19 1615  cefTRIAXone (ROCEPHIN) 2 g in sodium chloride 0.9 % 100 mL IVPB     2 g 200 mL/hr over 30 Minutes Intravenous  Once 07/29/19 1554 07/29/19 1841   07/29/19 1600   vancomycin (VANCOCIN) IVPB 1000 mg/200 mL premix  Status:  Discontinued     1,000 mg 200 mL/hr over 60 Minutes Intravenous  Once 07/29/19 1554 07/29/19 1625       Objective   Vitals:   07/31/19 1815 07/31/19 1900 07/31/19 2118 08/01/19 1433  BP: (!) 142/99  136/82 (!) 139/93  Pulse:  77 80 76  Resp:  Temp:   98.7 F (37.1 C) 98.9 F (37.2 C)  TempSrc:   Oral Oral  SpO2:  100% 100% 100%  Weight:      Height:        SpO2: 100 %  Wt Readings from Last 3 Encounters:  07/29/19 77.1 kg  07/22/19 77.1 kg  04/16/19 83.9 kg     Intake/Output Summary (Last 24 hours) at 08/01/2019 1705 Last data filed at 08/01/2019 1437 Gross per 24 hour  Intake 1440 ml  Output 3950 ml  Net -2510 ml    Physical Exam:  Awake Alert, Oriented X 3, Normal affect No new F.N deficits,  [place].AT, Normal respiratory effort on room air, CTAB RRR,No Gallops,Rubs or new Murmurs, Right knee  no longer erythematous or distended, persistent swelling of right calf, tender to touch, no significant erythema overlying, no fluctuance  +ve B.Sounds, Abd Soft, No tenderness, No rebound, guarding or rigidity. No Cyanosis, No new Rash or bruise     I have personally reviewed the following:   Data Reviewed:  CBC Recent Labs  Lab 07/26/19 0453 07/29/19 1543 07/30/19 0454 07/31/19 0406  WBC 24.0* 15.9* 11.0* 8.4  HGB 12.0* 12.0* 10.8* 11.3*  HCT 38.2* 38.0* 34.0* 35.9*  PLT 540* 464* 482* 453*  MCV 90.7 90.3 87.4 89.8  MCH 28.5 28.5 27.8 28.3  MCHC 31.4 31.6 31.8 31.5  RDW 15.2 14.6 14.5 14.4  LYMPHSABS  --  2.8 2.2  --   MONOABS  --  0.7 0.5  --   EOSABS  --  0.8* 0.5  --   BASOSABS  --  0.1 0.1  --     Chemistries  Recent Labs  Lab 07/26/19 0453 07/29/19 1543 07/29/19 1653 07/29/19 2315 07/30/19 0454 07/31/19 0406 08/01/19 0408  NA 141 140  --   --  141  --   --   K 3.7 3.0*  --   --  4.0  --   --   CL 110 105  --   --  108  --   --   CO2 22 27  --   --  25  --   --     GLUCOSE 140* 93  --   --  112*  --   --   BUN 18 10  --   --  9  --   --   CREATININE 1.00 0.79  --   --  1.00  --  0.99  CALCIUM 8.5* 8.5*  --   --  8.4*  --   --   MG 1.7  --   --  1.6* 1.6* 1.7  --   AST  --  22 19  --  19  --   --   ALT  --  18 17  --  16  --   --   ALKPHOS  --  54 46  --  46  --   --   BILITOT  --  0.4 0.4  --  0.3  --   --    ------------------------------------------------------------------------------------------------------------------ No results for input(s): CHOL, HDL, LDLCALC, TRIG, CHOLHDL, LDLDIRECT in the last 72 hours.  No results found for: HGBA1C ------------------------------------------------------------------------------------------------------------------ Recent Labs    07/30/19 0454  TSH 1.424   ------------------------------------------------------------------------------------------------------------------ Recent Labs    07/29/19 1917  FERRITIN 141    Coagulation profile Recent Labs  Lab 07/29/19 1543  INR 1.1    Recent Labs    07/29/19 2315  DDIMER 2.15*    Cardiac Enzymes No results for input(s): CKMB, TROPONINI, MYOGLOBIN in the last 168 hours.  Invalid input(s): CK ------------------------------------------------------------------------------------------------------------------ No results found for: BNP  Micro Results Recent Results (from the past 240 hour(s))  Respiratory Panel by RT PCR (Flu A&B, Covid) - Nasopharyngeal Swab     Status: Abnormal   Collection Time: 07/22/19  9:09 PM   Specimen: Nasopharyngeal Swab  Result Value Ref Range Status   SARS Coronavirus 2 by RT PCR POSITIVE (A) NEGATIVE Final    Comment: RESULT CALLED TO, READ BACK BY AND VERIFIED WITH: MAYNARD,C AT 0022 ON [id number] BY CHERESNOWSKY,T (NOTE) SARS-CoV-2 target nucleic acids are DETECTED. SARS-CoV-2 RNA is generally detectable in upper respiratory specimens  during the acute phase of infection.  Positive results are indicative of the  presence of the identified virus, but do not rule out bacterial infection or co-infection with other pathogens not detected by the test. Clinical correlation with patient history and other diagnostic information is necessary to determine patient infection status. The expected result is Negative. Fact Sheet for Patients:  https://www.moore.com/ Fact Sheet for Healthcare Providers: https://www.young.biz/ This test is not yet approved or cleared by the [place] FDA and  has been authorized for detection and/or diagnosis of SARS-CoV-2 by FDA under an Emergency Use Authorization (EUA).  This EUA will remain in effect (meaning this test can  be used) for the duration of  the COVID-19 declaration under Section 564(b)(1) of the Act, 21 U.S.C. section 360bbb-3(b)(1), unless the authorization is terminated or revoked sooner.    Influenza A by PCR NEGATIVE NEGATIVE Final   Influenza B by PCR NEGATIVE NEGATIVE Final    Comment: (NOTE) The Xpert Xpress SARS-CoV-2/FLU/RSV assay is intended as an aid in  the diagnosis of influenza from Nasopharyngeal swab specimens and  should not be used as a sole basis for treatment. Nasal washings and  aspirates are unacceptable for Xpert Xpress SARS-CoV-2/FLU/RSV  testing. Fact Sheet for Patients: https://www.moore.com/ Fact Sheet for Healthcare Providers: https://www.young.biz/ This test is not yet approved or cleared by the [place] FDA and  has been authorized for detection and/or diagnosis of SARS-CoV-2 by  FDA under an Emergency Use Authorization (EUA). This EUA will remain  in effect (meaning this test can be used) for the duration of the  Covid-19 declaration under Section 564(b)(1) of the Act, 21  U.S.C. section 360bbb-3(b)(1), unless the authorization is  terminated or revoked. Performed at [facility], [address] [place], [place] [address]     Culture, blood (routine x 2)     Status: None   Collection Time: 07/22/19  9:15 PM   Specimen: BLOOD LEFT ARM  Result Value Ref Range Status   Specimen Description   Final    BLOOD LEFT ARM Performed at [facility], [address] [place], [place] [address]    Special Requests   Final    BOTTLES DRAWN AEROBIC AND ANAEROBIC Blood Culture adequate volume Performed at [facility], [address] [place], [place] [address]    Culture   Final    NO GROWTH 5 DAYS Performed at [facility], [address], [place], [place] [address]    Report Status 07/27/2019 FINAL  Final  Culture, blood (routine x 2)     Status: None   Collection Time: 07/22/19  9:25 PM   Specimen: BLOOD RIGHT ARM  Result Value Ref Range Status   Specimen Description   Final    BLOOD RIGHT ARM Performed at [facility], [address] [place], [place] [address]    Special Requests   Final    BOTTLES DRAWN AEROBIC AND ANAEROBIC Blood Culture results may not be optimal due to an inadequate volume of blood received in culture bottles Performed at [facility], [address] [place], [place] [address]    Culture   Final    NO GROWTH 5 DAYS Performed at [facility], [address], [place], [place] [address]    Report Status 07/27/2019 FINAL  Final  Culture, blood (single)     Status: None   Collection Time: 07/22/19  9:30 PM   Specimen: BLOOD RIGHT HAND  Result Value Ref Range Status   Specimen Description   Final    BLOOD RIGHT HAND Performed at [facility], [address] [place], [place] [address]    Special Requests   Final    BOTTLES DRAWN AEROBIC AND ANAEROBIC Blood Culture adequate volume Performed at [facility], [address] [place], [place] [address]    Culture   Final    NO GROWTH 5 DAYS Performed at [facility], [address] [place], [place] [address]    Report Status 07/27/2019 FINAL  Final   Blood culture (routine x 2)     Status: None (Preliminary result)   Collection Time: 07/29/19  3:51 PM   Specimen: BLOOD LEFT FOREARM  Result Value Ref Range Status   Specimen Description   Final    BLOOD LEFT FOREARM Performed at [facility], [address], [place], [place] [address]    Special Requests   Final    BOTTLES DRAWN AEROBIC AND ANAEROBIC Blood Culture results may not be optimal due to an inadequate volume of blood received in culture bottles Performed at [facility], [address] [place], [place] [address]    Culture   Final    NO GROWTH 3 DAYS Performed at [facility], [address] [place], [place] [address]    Report Status PENDING  Incomplete  Blood culture (routine x 2)     Status: None (Preliminary result)   Collection Time: 07/29/19  3:56 PM   Specimen: BLOOD  Result Value Ref Range Status   Specimen Description   Final    BLOOD LEFT ANTECUBITAL Performed at [facility], [address] [place], [place] [address]    Special Requests   Final    BOTTLES DRAWN AEROBIC AND ANAEROBIC Blood Culture adequate volume Performed at [facility], [address], [place], [place] [address]    Culture   Final    NO GROWTH 3 DAYS Performed at [facility], [address] [place], [place] [address]    Report Status PENDING  Incomplete    Radiology Reports MR TIBIA FIBULA RIGHT WO CONTRAST  Result Date: 07/23/2019 CLINICAL DATA:  Right knee and lower leg swelling. History of IV drug abuse and COVID-19 infection. Clinical concern for infection. EXAM: MRI OF LOWER RIGHT EXTREMITY WITHOUT CONTRAST TECHNIQUE: Multiplanar, multisequence MR imaging of the right lower leg was performed. No intravenous contrast was administered. COMPARISON:  Radiographs 07/22/2019 and 01/08/2019. FINDINGS: Bones/Joint/Cartilage Both lower legs are included on the coronal images. Examination extends  from just above the knee through the distal lower leg. The ankle is not included. There is no evidence of acute fracture, dislocation or bone destruction. The right tibia and fibula appear normal. No significant knee joint effusion or arthropathy. Ligaments Not relevant for exam/indication. The cruciate ligaments appear intact at the knee. Muscles and Tendons No focal intramuscular fluid collection or significant muscular edema. The patellar tendon and visualized Achilles tendon appear normal. Soft tissues There is extensive subcutaneous edema and ill-defined fluid medially, extending from just above the knee into the distal lower leg. Ill-defined fluid tracks between the medial head of the gastrocnemius muscle and the soleus muscle, measuring up to 6 mm in thickness. No focal fluid collection identified on noncontrast imaging. No evidence of foreign body or soft tissue emphysema. No gross vascular abnormality. IMPRESSION: 1. Extensive subcutaneous edema and ill-defined fluid medially  in the right lower leg, extending from just above the knee into the distal lower leg. Findings are consistent with cellulitis and possible fasciitis. No focal fluid collection or intramuscular abnormality identified. 2. No evidence of osteomyelitis or septic joint at the right knee. Electronically Signed   By: Carey Bullocks M.D.   On: 07/23/2019 11:11   US Venous Img Lower Right (DVT Study)  Result Date: 07/22/2019 CLINICAL DATA:  Right leg swelling EXAM: RIGHT LOWER EXTREMITY VENOUS DOPPLER ULTRASOUND TECHNIQUE: Gray-scale sonography with compression, as well as color and duplex ultrasound, were performed to evaluate the deep venous system(s) from the level of the common femoral vein through the popliteal and proximal calf veins. COMPARISON:  None. FINDINGS: VENOUS Normal compressibility of the common femoral, superficial femoral and popliteal veins. The calf veins are limited in visualization secondary to surrounding soft  tissue edema. Visualized portions of profunda femoral vein and great saphenous vein unremarkable. No filling defects to suggest DVT on grayscale or color Doppler imaging. Doppler waveforms show normal direction of venous flow, normal respiratory phasicity and response to augmentation. Limited views of the contralateral common femoral vein are unremarkable. OTHER None. Limitations: none IMPRESSION: Limited evaluation of the calf veins, as described above, without evidence of right lower extremity DVT. If clinical symptoms are inconsistent or if there are persistent or worsening symptoms, further imaging (possibly involving the iliac veins) may be warranted. Electronically Signed   By: Aram Candela M.D.   On: 07/22/2019 21:24   DG CHEST PORT 1 VIEW  Result Date: 07/29/2019 CLINICAL DATA:  Right leg swelling. Ex-smoker. EXAM: PORTABLE CHEST 1 VIEW COMPARISON:  07/23/2018 FINDINGS: Interval mildly enlarged cardiac silhouette. Clear lungs with normal vascularity. Normal appearing bones. IMPRESSION: Interval mild cardiomegaly. No acute abnormality. Electronically Signed   By: Beckie Salts M.D.   On: 07/29/2019 19:37   DG CHEST PORT 1 VIEW  Result Date: 07/23/2019 CLINICAL DATA:  Right knee infection, COVID positive EXAM: PORTABLE CHEST 1 VIEW COMPARISON:  01/05/2019 FINDINGS: The heart size and mediastinal contours are within normal limits. Both lungs are clear. The visualized skeletal structures are unremarkable. IMPRESSION: No active disease. Electronically Signed   By: Jasmine Pang M.D.   On: 07/23/2019 01:15   DG Knee Complete 4 Views Right  Result Date: 07/22/2019 CLINICAL DATA:  Leg swelling.  IV drug use. EXAM: RIGHT KNEE - COMPLETE 4+ VIEW COMPARISON:  None. FINDINGS: No bony abnormalities. No fracture or effusion. No bony lesions. Soft tissue edema identified. IMPRESSION: Soft tissue edema/swelling.  No other abnormalities. Electronically Signed   By: Gerome Sam III M.D   On: 07/22/2019  20:47   DG Tibia/Fibula Right Port  Result Date: 07/29/2019 CLINICAL DATA:  Cellulitis right tib fib. EXAM: PORTABLE RIGHT TIBIA AND FIBULA - 2 VIEW COMPARISON:  MRI 07/23/2019. FINDINGS: Cortical margins of the tibia and fibular intact. No erosion or periosteal reaction. There is no evidence of fracture or other focal bone lesions. Knee and ankle alignment are maintained. Diffuse subcutaneous edema of the lower extremity. No soft tissue air. No radiopaque foreign body. IMPRESSION: Diffuse subcutaneous edema of the lower extremity. No soft tissue air or osseous abnormality. Electronically Signed   By: Narda Rutherford M.D.   On: 07/29/2019 23:59   VAS [place] LOWER EXTREMITY VENOUS (DVT)  Result Date: 07/30/2019  Lower Venous DVTStudy Indications: Pain, Swelling, Edema, Erythema, and Recent diagnosis of Covid and of cellulitis, failed antibiotics.  Comparison Study: Prior study from 07/22/19 is available for comparison Performing  Technologist: Sherren Kernsandace Kanady RVS  Examination Guidelines: A complete evaluation includes B-mode imaging, spectral Doppler, color Doppler, and power Doppler as needed of all accessible portions of each vessel. Bilateral testing is considered an integral part of a complete examination. Limited examinations for reoccurring indications may be performed as noted. The reflux portion of the exam is performed with the patient in reverse Trendelenburg.  +---------+---------------+---------+-----------+----------+-------------------+ RIGHT    CompressibilityPhasicitySpontaneityPropertiesThrombus Aging      +---------+---------------+---------+-----------+----------+-------------------+ CFV      Full           Yes      No                                       +---------+---------------+---------+-----------+----------+-------------------+ SFJ      Full                    Yes                                       +---------+---------------+---------+-----------+----------+-------------------+ FV Prox  Full                                                             +---------+---------------+---------+-----------+----------+-------------------+ FV Mid                                                patent by color and                                                       Doppler             +---------+---------------+---------+-----------+----------+-------------------+ FV Distal                                             patent by color and                                                       Doppler             +---------+---------------+---------+-----------+----------+-------------------+ PFV                                                   patent by color and  Doppler             +---------+---------------+---------+-----------+----------+-------------------+ POP      Full           Yes      Yes                                      +---------+---------------+---------+-----------+----------+-------------------+ PTV      Full                                                             +---------+---------------+---------+-----------+----------+-------------------+ PERO     Full                                                             +---------+---------------+---------+-----------+----------+-------------------+ Interstitial fluid noted throughout  +----+---------------+---------+-----------+----------+--------------+ LEFTCompressibilityPhasicitySpontaneityPropertiesThrombus Aging +----+---------------+---------+-----------+----------+--------------+ CFV Full           Yes      Yes                                 +----+---------------+---------+-----------+----------+--------------+     Summary: RIGHT: - There is no evidence of deep vein thrombosis in the lower extremity.  -  Ultrasound characteristics of enlarged lymph nodes are noted in the groin.  LEFT: - No evidence of common femoral vein obstruction. - Ultrasound characteristics of enlarged lymph nodes noted in the groin.  *See table(s) above for measurements and observations. Electronically signed by Sherald Hess MD on 07/30/2019 at 3:03:08 PM.    Final    ECHOCARDIOGRAM LIMITED  Result Date: 07/30/2019    ECHOCARDIOGRAM LIMITED REPORT   Patient Name:   VENUS RUHE Date of Exam: 07/30/2019 Medical Rec #:  [id number]              Height:       70.0 in Accession #:    [id number]             Weight:       170.0 lb Date of Birth:  1985-09-20             BSA:          1.948 m Patient Age:    33 years               BP:           136/84 mmHg Patient Gender: M                      HR:           77 bpm. Exam Location:  Inpatient Procedure: Limited Echo, Limited Color Doppler and Cardiac Doppler Indications:    Fever R50.9; Cardiomegaly I51.7  History:        Patient has prior history of Echocardiogram examinations, most                 recent 04/15/2019. COVID-19 Positive; Risk Factors:IV Drug User.  Sonographer:    Thurman Coyer  RDCS (AE) Referring Phys: [id number] ANASTASSIA DOUTOVA IMPRESSIONS  1. Left ventricular ejection fraction, by estimation, is 65 to 70%. The left ventricle has normal function. The left ventricle has no regional wall motion abnormalities. Left ventricular diastolic parameters were normal.  2. Right ventricular systolic function is normal. The right ventricular size is normal.  3. The mitral valve is grossly normal. Trivial mitral valve regurgitation. No evidence of mitral stenosis.  4. The aortic valve is tricuspid. Aortic valve regurgitation is not visualized. No aortic stenosis is present.  5. The inferior vena cava is normal in size with greater than 50% respiratory variability, suggesting right atrial pressure of 3 mmHg. Conclusion(s)/Recommendation(s): No evidence of valvular vegetations on this  transthoracic echocardiogram. Would recommend a transesophageal echocardiogram to exclude infective endocarditis if clinically indicated. FINDINGS  Left Ventricle: Left ventricular ejection fraction, by estimation, is 65 to 70%. The left ventricle has normal function. The left ventricle has no regional wall motion abnormalities. The left ventricular internal cavity size was normal in size. There is  no left ventricular hypertrophy. Right Ventricle: The right ventricular size is normal. No increase in right ventricular wall thickness. Right ventricular systolic function is normal. Left Atrium: Left atrial size was normal in size. Right Atrium: Right atrial size was normal in size. Pericardium: Trivial pericardial effusion is present. Mitral Valve: The mitral valve is grossly normal. Trivial mitral valve regurgitation. No evidence of mitral valve stenosis. Tricuspid Valve: The tricuspid valve is grossly normal. Tricuspid valve regurgitation is not demonstrated. No evidence of tricuspid stenosis. Aortic Valve: The aortic valve is tricuspid. Aortic valve regurgitation is not visualized. No aortic stenosis is present. Pulmonic Valve: The pulmonic valve was grossly normal. Pulmonic valve regurgitation is not visualized. No evidence of pulmonic stenosis. Aorta: The aortic root is normal in size and structure. Venous: The inferior vena cava is normal in size with greater than 50% respiratory variability, suggesting right atrial pressure of 3 mmHg. IAS/Shunts: The atrial septum is grossly normal.  LEFT VENTRICLE PLAX 2D LVIDd:         5.10 cm Diastology LVIDs:         3.20 cm LV e' lateral:   16.40 cm/s LV PW:         0.80 cm LV E/e' lateral: 7.6 LV IVS:        0.90 cm LV e' medial:    12.60 cm/s                        LV E/e' medial:  9.8  LEFT ATRIUM           Index       RIGHT ATRIUM           Index LA diam:      3.60 cm 1.85 cm/m  RA Area:     13.90 cm LA Vol (A4C): 49.8 ml 25.56 ml/m RA Volume:   30.40 ml  15.61  ml/m   AORTA Ao Root diam: 2.60 cm MITRAL VALVE MV Area (PHT): 5.88 cm MV Decel Time: 129 msec MV E velocity: 124.00 cm/s MV A velocity: 93.00 cm/s MV E/A ratio:  1.33 Lennie Odor MD Electronically signed by Lennie Odor MD Signature Date/Time: 07/30/2019/11:31:31 AM    Final      Time Spent in minutes  30     Laverna Peace M.D on 08/01/2019 at 5:05 PM  To page go to [email or web address] - password [facility]

## 2019-08-01 NOTE — Progress Notes (Signed)
Pharmacy Antibiotic Note  Grant Tapia is a 34 y.o. male with hx IVDU who was recently hospitalized and treated with vancomycin/zosyn for right knee/leg cellulitis and discharged on 4/14 with doxycycline and augmentin (appeared that he only filled augmentin prescription).  He presented back to the ED on 07/29/2019 with c/o worsening of LE swelling and pain.  Vancomycin, ceftriaxone and flagyl started on admission for cellulitis.  - 4/17 X-ray tibia/fibula:  Diffuse subcutaneous edema of the lower extremity. No soft tissue air or osseous abnormality - 4/18 TTE: no evidence of valvular vegetations noted  Today, 08/01/2019: - day #3 abx - afeb, wbc down wnl -  scr 0.99 (crcl~100) - blood cultures have been negative thus far  Plan: - continue vancomycin 1750 mg IV q12h and ceftriaxone 2gm IV q24j - pharmacy will plan on checking vancomycin level if plan is to continue with vancomycin for >7 days - f/u renal function and clinical status ______________________________________  Height: 5\' 10"  (177.8 cm) Weight: 77.1 kg (170 lb) IBW/kg (Calculated) : 73  Temp (24hrs), Avg:98.3 F (36.8 C), Min:97.9 F (36.6 C), Max:98.7 F (37.1 C)  Recent Labs  Lab 07/26/19 0453 07/29/19 1543 07/29/19 2315 07/30/19 0454 07/31/19 0406 08/01/19 0408  WBC 24.0* 15.9*  --  11.0* 8.4  --   CREATININE 1.00 0.79  --  1.00  --  0.99  LATICACIDVEN  --  1.7 1.1  --   --   --     Estimated Creatinine Clearance: 111.6 mL/min (by C-G formula based on SCr of 0.99 mg/dL).    Allergies  Allergen Reactions  . Bee Venom Swelling    Antimicrobials this admission:  4/17 ceftriaxone >>  4/17 vancomycin >>  4/17 metronidazole >> 4/20  Dose adjustments this admission:  --  Microbiology results:  4/10 BCx: NGF 4/10 COVID-19: positive  Thank you for allowing pharmacy to be a part of this patient's care.  6/10 08/01/2019 8:32 AM

## 2019-08-02 ENCOUNTER — Inpatient Hospital Stay (HOSPITAL_COMMUNITY): Payer: Self-pay

## 2019-08-02 DIAGNOSIS — L0390 Cellulitis, unspecified: Secondary | ICD-10-CM

## 2019-08-02 LAB — HCV RT-PCR, QUANT (NON-GRAPH)

## 2019-08-02 LAB — HCV AB W REFLEX TO QUANT PCR: HCV Ab: >11 s/co ratio — ABNORMAL HIGH (ref 0.0–0.9)

## 2019-08-02 MED ORDER — VALACYCLOVIR HCL 500 MG PO TABS
500.0000 mg | ORAL_TABLET | Freq: Three times a day (TID) | ORAL | Status: DC
  Administered 2019-08-02 – 2019-08-03 (×3): 500 mg via ORAL
  Filled 2019-08-02 (×6): qty 1

## 2019-08-02 NOTE — Progress Notes (Signed)
PROGRESS NOTE    Grant Tapia  YYT:[id number] DOB: Dec 19, 1985 DOA: 07/29/2019 PCP: Patient, No Pcp Per      Brief Narrative:  Grant Tapia is a 34 y.o. M with hx IVDU, HTN, neck abscess and recent admission for right knee cellulitis (dry tap of right knee on that admission, Ortho evaluated, felt knee appeared clinically benign, recommended medical management, and had normal TTE and negative blood cultures) discharged on doxycycline who returned with worsening right calf pain and swelling.  In the ER, febrile, WBC 28K, Lactate normal.  Orthopedics were consulted and patient was started on IV antibiotics.          Assessment & Plan:  Sepsis secondary to IVDU associated cellulitis WBC normalized, right calf appears normal without focal swelling, or induration.  Duplex ultrasound ruled out DVT, shows possible muscle tear, doubt intramuscular infection given benign exam. -Continue vancomycin and ceftriaxone  IVDU -TOC consult for assistance with substance use -Continue Suboxone  Vesicles on tip of nose No forehead vesicles to suggest zoster ophthalmicus.  Maybe HSV.  Odd that impetigo would develop while on antibiotics.  Doubt drug reaction, no other mucocutaenous findings. -Check HSV-1 and VZV PCR of vesicle swab -Start empiric valtrex  Asymptomatic COVID-19 infection, incidental  Hypertension Blood pressure normal  Hypomagnesemia Resolved       Disposition: Status is: Inpatient  Remains inpatient appropriate because:Failure of outpatient antibiotics, requiring ongoing IV antibiotics   Dispo: The patient is from: Home              Anticipated d/c is to: Home              Anticipated d/c date is: 1 day              Patient currently is not medically stable to d/c.               MDM: The below labs and imaging reports were reviewed and summarized above.  Medication management as above.   DVT prophylaxis: Lovenox Code Status:  FULL Family Communication:     Consultants:     Procedures:     Antimicrobials:   Ceftriaxone 4/17 >>  Vancomycin 4/17 >>  Flagyl 4/17-4/19   Culture data:   4/17 blood culture no growth to date          Subjective: No fever.  Has pain with standing in the right calf.  No confusion. Swelling is resolved.   Objective: Vitals:   07/31/19 2118 08/01/19 1433 08/01/19 2123 08/02/19 0638  BP: 136/82 (!) 139/93  124/88  Pulse: 80 76  80  Resp: 13 14 18 15   Temp: 98.7 F (37.1 C) 98.9 F (37.2 C) 98.8 F (37.1 C) 98.7 F (37.1 C)  TempSrc: Oral Oral Oral Oral  SpO2: 100% 100%  100%  Weight:      Height:        Intake/Output Summary (Last 24 hours) at 08/02/2019 1204 Last data filed at 08/02/2019 0900 Gross per 24 hour  Intake 326.68 ml  Output 3651 ml  Net -3324.32 ml   Filed Weights   07/29/19 1453  Weight: 77.1 kg    Examination: General appearance: thin adult male, alert and in  No acute distress.   HEENT: Anicteric, conjunctiva pink, lids and lashes normal. No nasal deformity, discharge, epistaxis.  Lips moist.   Skin: Warm and dry.  no jaundice.  No suspicious rashes or lesions. Cardiac: RRR, nl S1-S2, no murmurs appreciated.  Capillary refill is  brisk.  JVP not visible.  No LE edema.  Radial  pulses 2+ and symmetric. Respiratory: Normal respiratory rate and rhythm.  CTAB without rales or wheezes. Abdomen: Abdomen soft.  no TTP. No ascites, distension, hepatosplenomegaly.   MSK: No deformities or effusions. Neuro: Awake and alert.  EOMI, moves all extremities. Speech fluent.    Psych: Sensorium intact and responding to questions, attention normal. Affect normal.  Judgment and insight appear normal.    Data Reviewed: I have personally reviewed following labs and imaging studies:  CBC: Recent Labs  Lab 07/29/19 1543 07/30/19 0454 07/31/19 0406  WBC 15.9* 11.0* 8.4  NEUTROABS 11.1* 7.4  --   HGB 12.0* 10.8* 11.3*  HCT 38.0* 34.0* 35.9*   MCV 90.3 87.4 89.8  PLT 464* 482* 453*   Basic Metabolic Panel: Recent Labs  Lab 07/29/19 1543 07/29/19 2315 07/30/19 0454 07/31/19 0406 08/01/19 0408  NA 140  --  141  --   --   K 3.0*  --  4.0  --   --   CL 105  --  108  --   --   CO2 27  --  25  --   --   GLUCOSE 93  --  112*  --   --   BUN 10  --  9  --   --   CREATININE 0.79  --  1.00  --  0.99  CALCIUM 8.5*  --  8.4*  --   --   MG  --  1.6* 1.6* 1.7  --   PHOS  --  4.5 4.0  --   --    GFR: Estimated Creatinine Clearance: 111.6 mL/min (by C-G formula based on SCr of 0.99 mg/dL). Liver Function Tests: Recent Labs  Lab 07/29/19 1543 07/29/19 1653 07/30/19 0454  AST 22 19 19   ALT 18 17 16   ALKPHOS 54 46 46  BILITOT 0.4 0.4 0.3  PROT 7.9 7.0 6.9  ALBUMIN 2.7* 2.2* 2.2*   No results for input(s): LIPASE, AMYLASE in the last 168 hours. No results for input(s): AMMONIA in the last 168 hours. Coagulation Profile: Recent Labs  Lab 07/29/19 1543  INR 1.1   Cardiac Enzymes: No results for input(s): CKTOTAL, CKMB, CKMBINDEX, TROPONINI in the last 168 hours. BNP (last 3 results) No results for input(s): PROBNP in the last 8760 hours. HbA1C: No results for input(s): HGBA1C in the last 72 hours. CBG: No results for input(s): GLUCAP in the last 168 hours. Lipid Profile: No results for input(s): CHOL, HDL, LDLCALC, TRIG, CHOLHDL, LDLDIRECT in the last 72 hours. Thyroid Function Tests: No results for input(s): TSH, T4TOTAL, FREET4, T3FREE, THYROIDAB in the last 72 hours. Anemia Panel: No results for input(s): VITAMINB12, FOLATE, FERRITIN, TIBC, IRON, RETICCTPCT in the last 72 hours. Urine analysis:    Component Value Date/Time   COLORURINE YELLOW 12/30/2016 0215   APPEARANCEUR CLEAR 12/30/2016 0215   LABSPEC 1.021 12/30/2016 0215   PHURINE 5.0 12/30/2016 0215   GLUCOSEU NEGATIVE 12/30/2016 0215   HGBUR NEGATIVE 12/30/2016 0215   BILIRUBINUR NEGATIVE 12/30/2016 0215   KETONESUR NEGATIVE 12/30/2016 0215    PROTEINUR NEGATIVE 12/30/2016 0215   UROBILINOGEN 0.2 03/26/2014 2255   NITRITE NEGATIVE 12/30/2016 0215   LEUKOCYTESUR NEGATIVE 12/30/2016 0215   Sepsis Labs: @LABRCNTIP (procalcitonin:4,lacticacidven:4)  ) Recent Results (from the past 240 hour(s))  Blood culture (routine x 2)     Status: None (Preliminary result)   Collection Time: 07/29/19  3:51 PM   Specimen: BLOOD LEFT  FOREARM  Result Value Ref Range Status   Specimen Description   Final    BLOOD LEFT FOREARM Performed at [facility], [address] [place], [place] [address]    Special Requests   Final    BOTTLES DRAWN AEROBIC AND ANAEROBIC Blood Culture results may not be optimal due to an inadequate volume of blood received in culture bottles Performed at [facility], [address] [place], [place] [address]    Culture   Final    NO GROWTH 4 DAYS Performed at [facility], [address] [place], [place] [address]    Report Status PENDING  Incomplete  Blood culture (routine x 2)     Status: None (Preliminary result)   Collection Time: 07/29/19  3:56 PM   Specimen: BLOOD  Result Value Ref Range Status   Specimen Description   Final    BLOOD LEFT ANTECUBITAL Performed at [facility], [address] [place], [place] [address]    Special Requests   Final    BOTTLES DRAWN AEROBIC AND ANAEROBIC Blood Culture adequate volume Performed at [facility], [address], [place], [place] [address]    Culture   Final    NO GROWTH 4 DAYS Performed at [facility], [address], [place], [place] [address]    Report Status PENDING  Incomplete         Radiology Studies: VAS [place] LOWER EXTREMITY VENOUS (DVT)  Result Date: 08/02/2019  Lower Venous DVTStudy Indications: Cellulitis on antibiotics, persistant calf pain.  Comparison Study: 07/29/19 negative for DVT Performing Technologist: Jeb Levering RDMS, RVT  Examination  Guidelines: A complete evaluation includes B-mode imaging, spectral Doppler, color Doppler, and power Doppler as needed of all accessible portions of each vessel. Bilateral testing is considered an integral part of a complete examination. Limited examinations for reoccurring indications may be performed as noted. The reflux portion of the exam is performed with the patient in reverse Trendelenburg.  +---------+---------------+---------+-----------+----------+--------------+ RIGHT    CompressibilityPhasicitySpontaneityPropertiesThrombus Aging +---------+---------------+---------+-----------+----------+--------------+ CFV      Full           Yes      Yes                                 +---------+---------------+---------+-----------+----------+--------------+ SFJ      Full                                                        +---------+---------------+---------+-----------+----------+--------------+ FV Prox  Full                                                        +---------+---------------+---------+-----------+----------+--------------+ FV Mid   Full                                                        +---------+---------------+---------+-----------+----------+--------------+ FV DistalFull                                                        +---------+---------------+---------+-----------+----------+--------------+  PFV      Full                                                        +---------+---------------+---------+-----------+----------+--------------+ POP      Full           Yes      Yes                                 +---------+---------------+---------+-----------+----------+--------------+ PTV      Full                                                        +---------+---------------+---------+-----------+----------+--------------+ PERO     Full                                                         +---------+---------------+---------+-----------+----------+--------------+     Summary: RIGHT: - There is no evidence of deep vein thrombosis in the lower extremity.  - No cystic structure found in the popliteal fossa. - Irregular areas of mixed internal echoes noted in mid calf, possibly muscle tear versus unknown etiology. - Ultrasound characteristics of enlarged lymph nodes are noted in the groin.   *See table(s) above for measurements and observations.    Preliminary         Scheduled Meds: . buprenorphine-naloxone  1 tablet Sublingual BID  . feeding supplement (PRO-STAT SUGAR FREE 64)  30 mL Oral BID  . multivitamin with minerals  1 tablet Oral Daily  . sodium chloride flush  3 mL Intravenous Q12H  . sodium chloride flush  3 mL Intravenous Q12H   Continuous Infusions: . sodium chloride    . sodium chloride Stopped (07/30/19 1555)  . cefTRIAXone (ROCEPHIN)  IV Stopped (08/01/19 1745)  . vancomycin 1,750 mg (08/02/19 0551)     LOS: 4 days    Time spent: 25 minutes    Alberteen Sam, MD [facility] 08/02/2019, 12:04 PM     Please page though AMION or Epic secure chat:  For [employer], Higher education careers adviser

## 2019-08-02 NOTE — Progress Notes (Signed)
Lower venous duplex       has been completed. Preliminary results can be found under CV proc through chart review. Mohmmad Saleeby, BS, RDMS, RVT   

## 2019-08-03 ENCOUNTER — Encounter: Payer: Self-pay | Admitting: Internal Medicine

## 2019-08-03 LAB — CULTURE, BLOOD (ROUTINE X 2)
Culture: NO GROWTH
Culture: NO GROWTH
Special Requests: ADEQUATE

## 2019-08-03 LAB — CREATININE, SERUM
Creatinine, Ser: 0.99 mg/dL (ref 0.61–1.24)
GFR calc Af Amer: >60 mL/min (ref >60)
GFR calc non Af Amer: >60 mL/min (ref >60)

## 2019-08-03 MED ORDER — BUPRENORPHINE HCL-NALOXONE HCL 8-2 MG SL SUBL: 1.0000 | SUBLINGUAL_TABLET | Freq: Two times a day (BID) | SUBLINGUAL | 0 refills | Status: DC

## 2019-08-03 MED ORDER — CEPHALEXIN 500 MG PO CAPS: 500.0000 mg | ORAL_CAPSULE | Freq: Three times a day (TID) | ORAL | 0 refills | Status: DC

## 2019-08-03 MED ORDER — SULFAMETHOXAZOLE-TRIMETHOPRIM 800-160 MG PO TABS: 1.0000 | ORAL_TABLET | Freq: Two times a day (BID) | ORAL | 0 refills | Status: DC

## 2019-08-03 MED ORDER — CEPHALEXIN 500 MG PO CAPS: 500.0000 mg | ORAL_CAPSULE | Freq: Three times a day (TID) | ORAL | 0 refills | Status: AC

## 2019-08-03 MED ORDER — BUPRENORPHINE HCL-NALOXONE HCL 8-2 MG SL SUBL: 1.0000 | SUBLINGUAL_TABLET | Freq: Two times a day (BID) | SUBLINGUAL | 0 refills | Status: AC

## 2019-08-03 MED FILL — CEPHALEXIN 500 MG CAPSULE: 500 | 7 days supply | Qty: 21 | Fill #0

## 2019-08-03 MED FILL — SULFAMETHOXAZOLE-TMP DS TAB: 800-160 | 5 days supply | Qty: 10 | Fill #0

## 2019-08-03 MED FILL — BUPRENORPHIN-NALOXON 8-2 MG: 8-2 | 2 days supply | Qty: 5 | Fill #0

## 2019-08-03 NOTE — Discharge Summary (Signed)
Physician Discharge Summary  Maximillion Gill GMW:[id number] DOB: 10-05-1985 DOA: 07/29/2019  PCP: Patient, No Pcp Per  Admit date: 07/29/2019 Discharge date: 08/03/2019  Admitted From: Home  Disposition:  Home   Recommendations for Outpatient Follow-up:  1. Follow up with new PCP in 4 weeks 2. Ms Brooke Dare: Please follow up Hep C viral load --> refer to [facility] clinic for evaluation of hepatitis C and possible treatment if virus RNA detected     Home Health: None  Equipment/Devices: Crutches  Discharge Condition: Poor  CODE STATUS: FULL Diet recommendation: Regular  Brief/Interim Summary: Mr. Depaula is a 34 y.o. M with hx IVDU, HTN, neck abscess and recent admission for right knee cellulitis (dry tap of right knee on that admission, Ortho evaluated, felt knee appeared clinically benign, recommended medical management, and had normal TTE and negative blood cultures) discharged on doxycycline who returned with worsening right calf pain and swelling.  In the ER, febrile, WBC 28K, Lactate normal.  Orthopedics were consulted and patient was started on IV antibiotics.       PRINCIPAL HOSPITAL DIAGNOSIS: Sepsis secondary to IVDU associated cellutilis of the right leg    Discharge Diagnoses:   Sepsis secondary to IVDU associated cellulitis Patient admitted and started on empiric antibiotics with vancomycin and ceftriaxone.    WBC normalized, right calf swelling resolved.    Duplex ultrasound ruled out DVT, showed possible muscle tear.  Given normal appearing calf, doubt myositis or abscess or other complicated deep tissue infection.  See images below.    Discharged to complete 10 days antibiotics with Bactrim and Cephalexin.     Muscle tear gastrocnemius [place] suggested possible muscle tear.  Discussed with Ortho.  Given no concern for complicated ifnection on exam, this requires conservative management with CAM boot.    IVDU TOC consulted, referral  information for SUD treatment provided.  Patient started on Suboxone, discharged with 5 day supply.    Vesicles on tip of nose No forehead vesicles to suggest zoster ophthalmicus.  Maybe HSV.  Odd that impetigo would develop while on antibiotics but given lack of spread and developing crust, I suspect this is impetigo.  Doubt drug reaction, no other mucocutaenous findings.  Follow HSV-1 and VZV PCR swabs of vesicles.    If these are either positive, I will call back patient with Valtrex prescritpion.  Asymptomatic COVID-19 infection, incidental Completed isolation on 4/20  Hypertension Blood pressure normal  Hypomagnesemia Resolved          Discharge Instructions  Discharge Instructions    Diet - low sodium heart healthy   Complete by: As directed    Discharge instructions   Complete by: As directed    To finish treating the infection: Take the two antibiotics cephalexin and Bactrim Take Bactrim DS 1 tab twice daily for 5 more days Take cephalexin/Keflex 500 mg three times daily for 7 more days  Return if you have severe swelling, fever, or pain at the joint  Take ibuprofen 400 mg (two tabs) up to three times daily for pain You may take acetaminophen 1000 mg up to three times daily as well  You need to follow up at the [facility] and wellness center for the hepatitis test results and a referral for hep C treatment  Use the CAM boot as needed until your calf feels better Use the crutches as needed Follow up with the [facility] or ask them for a recmomendation for a primary care doctor  Take suboxone once daily until you get in to rehab   Increase activity slowly   Complete by: As directed      Allergies as of 08/03/2019      Reactions   Bee Venom Swelling      Medication List    STOP taking these medications   amLODipine 2.5 MG tablet Commonly known as: NORVASC   amoxicillin-clavulanate 875-125 MG tablet Commonly known as:  Augmentin   cyclobenzaprine 5 MG tablet Commonly known as: FLEXERIL   doxycycline 100 MG tablet Commonly known as: VIBRA-TABS   ibuprofen 200 MG tablet Commonly known as: ADVIL   Ibuprofen-Acetaminophen 125-250 MG Tabs     TAKE these medications   buprenorphine-naloxone 8-2 mg Subl SL tablet Commonly known as: SUBOXONE Place 1 tablet under the tongue 2 (two) times daily.   cephALEXin 500 MG capsule Commonly known as: KEFLEX Take 1 capsule (500 mg total) by mouth 3 (three) times daily for 7 days.   sulfamethoxazole-trimethoprim 800-160 MG tablet Commonly known as: BACTRIM DS Take 1 tablet by mouth 2 (two) times daily.            Durable Medical Equipment  (From admission, onward)         Start     Ordered   08/03/19 1008  For home use only DME Crutches  Once     08/03/19 1008         Follow-up Information    [facility] Go to.   Why: May 27 at 1PM BY PHONE Nurse Practitioner Thad Ranger will call you        [facility] Follow up.   Specialty: Infectious Diseases Why: Follow up when able, or when referred by PCP Contact information: [address] [place], [address] [id number] mc [place] [address] [phone]         Allergies  Allergen Reactions  . Bee Venom Swelling    Consultations:  Orthopedics   Procedures/Studies: MR TIBIA FIBULA RIGHT WO CONTRAST  Result Date: 07/23/2019 CLINICAL DATA:  Right knee and lower leg swelling. History of IV drug abuse and COVID-19 infection. Clinical concern for infection. EXAM: MRI OF LOWER RIGHT EXTREMITY WITHOUT CONTRAST TECHNIQUE: Multiplanar, multisequence MR imaging of the right lower leg was performed. No intravenous contrast was administered. COMPARISON:  Radiographs 07/22/2019 and 01/08/2019. FINDINGS: Bones/Joint/Cartilage Both lower legs are included on the coronal images. Examination extends from just above the knee through the  distal lower leg. The ankle is not included. There is no evidence of acute fracture, dislocation or bone destruction. The right tibia and fibula appear normal. No significant knee joint effusion or arthropathy. Ligaments Not relevant for exam/indication. The cruciate ligaments appear intact at the knee. Muscles and Tendons No focal intramuscular fluid collection or significant muscular edema. The patellar tendon and visualized Achilles tendon appear normal. Soft tissues There is extensive subcutaneous edema and ill-defined fluid medially, extending from just above the knee into the distal lower leg. Ill-defined fluid tracks between the medial head of the gastrocnemius muscle and the soleus muscle, measuring up to 6 mm in thickness. No focal fluid collection identified on noncontrast imaging. No evidence of foreign body or soft tissue emphysema. No gross vascular abnormality. IMPRESSION: 1. Extensive subcutaneous edema and ill-defined fluid medially in the right lower leg, extending from just above the knee into the distal lower leg. Findings are consistent with cellulitis and possible fasciitis. No focal fluid collection or intramuscular abnormality identified. 2.  No evidence of osteomyelitis or septic joint at the right knee. Electronically Signed   By: Carey Bullocks M.D.   On: 07/23/2019 11:11   US Venous Img Lower Right (DVT Study)  Result Date: 07/22/2019 CLINICAL DATA:  Right leg swelling EXAM: RIGHT LOWER EXTREMITY VENOUS DOPPLER ULTRASOUND TECHNIQUE: Gray-scale sonography with compression, as well as color and duplex ultrasound, were performed to evaluate the deep venous system(s) from the level of the common femoral vein through the popliteal and proximal calf veins. COMPARISON:  None. FINDINGS: VENOUS Normal compressibility of the common femoral, superficial femoral and popliteal veins. The calf veins are limited in visualization secondary to surrounding soft tissue edema. Visualized portions of  profunda femoral vein and great saphenous vein unremarkable. No filling defects to suggest DVT on grayscale or color Doppler imaging. Doppler waveforms show normal direction of venous flow, normal respiratory phasicity and response to augmentation. Limited views of the contralateral common femoral vein are unremarkable. OTHER None. Limitations: none IMPRESSION: Limited evaluation of the calf veins, as described above, without evidence of right lower extremity DVT. If clinical symptoms are inconsistent or if there are persistent or worsening symptoms, further imaging (possibly involving the iliac veins) may be warranted. Electronically Signed   By: Aram Candela M.D.   On: 07/22/2019 21:24   DG CHEST PORT 1 VIEW  Result Date: 07/29/2019 CLINICAL DATA:  Right leg swelling. Ex-smoker. EXAM: PORTABLE CHEST 1 VIEW COMPARISON:  07/23/2018 FINDINGS: Interval mildly enlarged cardiac silhouette. Clear lungs with normal vascularity. Normal appearing bones. IMPRESSION: Interval mild cardiomegaly. No acute abnormality. Electronically Signed   By: Beckie Salts M.D.   On: 07/29/2019 19:37   DG CHEST PORT 1 VIEW  Result Date: 07/23/2019 CLINICAL DATA:  Right knee infection, COVID positive EXAM: PORTABLE CHEST 1 VIEW COMPARISON:  01/05/2019 FINDINGS: The heart size and mediastinal contours are within normal limits. Both lungs are clear. The visualized skeletal structures are unremarkable. IMPRESSION: No active disease. Electronically Signed   By: Jasmine Pang M.D.   On: 07/23/2019 01:15   DG Knee Complete 4 Views Right  Result Date: 07/22/2019 CLINICAL DATA:  Leg swelling.  IV drug use. EXAM: RIGHT KNEE - COMPLETE 4+ VIEW COMPARISON:  None. FINDINGS: No bony abnormalities. No fracture or effusion. No bony lesions. Soft tissue edema identified. IMPRESSION: Soft tissue edema/swelling.  No other abnormalities. Electronically Signed   By: Gerome Sam III M.D   On: 07/22/2019 20:47   DG Tibia/Fibula Right  Port  Result Date: 07/29/2019 CLINICAL DATA:  Cellulitis right tib fib. EXAM: PORTABLE RIGHT TIBIA AND FIBULA - 2 VIEW COMPARISON:  MRI 07/23/2019. FINDINGS: Cortical margins of the tibia and fibular intact. No erosion or periosteal reaction. There is no evidence of fracture or other focal bone lesions. Knee and ankle alignment are maintained. Diffuse subcutaneous edema of the lower extremity. No soft tissue air. No radiopaque foreign body. IMPRESSION: Diffuse subcutaneous edema of the lower extremity. No soft tissue air or osseous abnormality. Electronically Signed   By: Narda Rutherford M.D.   On: 07/29/2019 23:59   VAS [place] LOWER EXTREMITY VENOUS (DVT)  Result Date: 08/02/2019  Lower Venous DVTStudy Indications: Cellulitis on antibiotics, persistant calf pain.  Comparison Study: 07/29/19 negative for DVT Performing Technologist: Jeb Levering RDMS, RVT  Examination Guidelines: A complete evaluation includes B-mode imaging, spectral Doppler, color Doppler, and power Doppler as needed of all accessible portions of each vessel. Bilateral testing is considered an integral part of a complete examination. Limited examinations for reoccurring  indications may be performed as noted. The reflux portion of the exam is performed with the patient in reverse Trendelenburg.  +---------+---------------+---------+-----------+----------+--------------+ RIGHT    CompressibilityPhasicitySpontaneityPropertiesThrombus Aging +---------+---------------+---------+-----------+----------+--------------+ CFV      Full           Yes      Yes                                 +---------+---------------+---------+-----------+----------+--------------+ SFJ      Full                                                        +---------+---------------+---------+-----------+----------+--------------+ FV Prox  Full                                                         +---------+---------------+---------+-----------+----------+--------------+ FV Mid   Full                                                        +---------+---------------+---------+-----------+----------+--------------+ FV DistalFull                                                        +---------+---------------+---------+-----------+----------+--------------+ PFV      Full                                                        +---------+---------------+---------+-----------+----------+--------------+ POP      Full           Yes      Yes                                 +---------+---------------+---------+-----------+----------+--------------+ PTV      Full                                                        +---------+---------------+---------+-----------+----------+--------------+ PERO     Full                                                        +---------+---------------+---------+-----------+----------+--------------+     Summary: RIGHT: - There is no evidence of deep vein thrombosis in the lower extremity.  - No cystic structure found in  the popliteal fossa. - Irregular areas of mixed internal echoes noted in mid calf, possibly muscle tear versus unknown etiology. - Ultrasound characteristics of enlarged lymph nodes are noted in the groin.   *See table(s) above for measurements and observations. Electronically signed by Gretta Began MD on 08/02/2019 at 3:35:27 PM.    Final    VAS [place] LOWER EXTREMITY VENOUS (DVT)  Result Date: 07/30/2019  Lower Venous DVTStudy Indications: Pain, Swelling, Edema, Erythema, and Recent diagnosis of Covid and of cellulitis, failed antibiotics.  Comparison Study: Prior study from 07/22/19 is available for comparison Performing Technologist: Sherren Kerns RVS  Examination Guidelines: A complete evaluation includes B-mode imaging, spectral Doppler, color Doppler, and power Doppler as needed of all accessible portions of each vessel.  Bilateral testing is considered an integral part of a complete examination. Limited examinations for reoccurring indications may be performed as noted. The reflux portion of the exam is performed with the patient in reverse Trendelenburg.  +---------+---------------+---------+-----------+----------+-------------------+ RIGHT    CompressibilityPhasicitySpontaneityPropertiesThrombus Aging      +---------+---------------+---------+-----------+----------+-------------------+ CFV      Full           Yes      No                                       +---------+---------------+---------+-----------+----------+-------------------+ SFJ      Full                    Yes                                      +---------+---------------+---------+-----------+----------+-------------------+ FV Prox  Full                                                             +---------+---------------+---------+-----------+----------+-------------------+ FV Mid                                                patent by color and                                                       Doppler             +---------+---------------+---------+-----------+----------+-------------------+ FV Distal                                             patent by color and                                                       Doppler             +---------+---------------+---------+-----------+----------+-------------------+  PFV                                                   patent by color and                                                       Doppler             +---------+---------------+---------+-----------+----------+-------------------+ POP      Full           Yes      Yes                                      +---------+---------------+---------+-----------+----------+-------------------+ PTV      Full                                                              +---------+---------------+---------+-----------+----------+-------------------+ PERO     Full                                                             +---------+---------------+---------+-----------+----------+-------------------+ Interstitial fluid noted throughout  +----+---------------+---------+-----------+----------+--------------+ LEFTCompressibilityPhasicitySpontaneityPropertiesThrombus Aging +----+---------------+---------+-----------+----------+--------------+ CFV Full           Yes      Yes                                 +----+---------------+---------+-----------+----------+--------------+     Summary: RIGHT: - There is no evidence of deep vein thrombosis in the lower extremity.  - Ultrasound characteristics of enlarged lymph nodes are noted in the groin.  LEFT: - No evidence of common femoral vein obstruction. - Ultrasound characteristics of enlarged lymph nodes noted in the groin.  *See table(s) above for measurements and observations. Electronically signed by Sherald Hess MD on 07/30/2019 at 3:03:08 PM.    Final    ECHOCARDIOGRAM LIMITED  Result Date: 07/30/2019    ECHOCARDIOGRAM LIMITED REPORT   Patient Name:   TAITEN BRAWN Date of Exam: 07/30/2019 Medical Rec #:  [id number]              Height:       70.0 in Accession #:    [id number]             Weight:       170.0 lb Date of Birth:  Jan 23, 1986             BSA:          1.948 m Patient Age:    33 years               BP:  136/84 mmHg Patient Gender: M                      HR:           77 bpm. Exam Location:  Inpatient Procedure: Limited Echo, Limited Color Doppler and Cardiac Doppler Indications:    Fever R50.9; Cardiomegaly I51.7  History:        Patient has prior history of Echocardiogram examinations, most                 recent 04/15/2019. COVID-19 Positive; Risk Factors:IV Drug User.  Sonographer:    Thurman Coyer RDCS (AE) Referring Phys: [id number] ANASTASSIA DOUTOVA IMPRESSIONS  1. Left  ventricular ejection fraction, by estimation, is 65 to 70%. The left ventricle has normal function. The left ventricle has no regional wall motion abnormalities. Left ventricular diastolic parameters were normal.  2. Right ventricular systolic function is normal. The right ventricular size is normal.  3. The mitral valve is grossly normal. Trivial mitral valve regurgitation. No evidence of mitral stenosis.  4. The aortic valve is tricuspid. Aortic valve regurgitation is not visualized. No aortic stenosis is present.  5. The inferior vena cava is normal in size with greater than 50% respiratory variability, suggesting right atrial pressure of 3 mmHg. Conclusion(s)/Recommendation(s): No evidence of valvular vegetations on this transthoracic echocardiogram. Would recommend a transesophageal echocardiogram to exclude infective endocarditis if clinically indicated. FINDINGS  Left Ventricle: Left ventricular ejection fraction, by estimation, is 65 to 70%. The left ventricle has normal function. The left ventricle has no regional wall motion abnormalities. The left ventricular internal cavity size was normal in size. There is  no left ventricular hypertrophy. Right Ventricle: The right ventricular size is normal. No increase in right ventricular wall thickness. Right ventricular systolic function is normal. Left Atrium: Left atrial size was normal in size. Right Atrium: Right atrial size was normal in size. Pericardium: Trivial pericardial effusion is present. Mitral Valve: The mitral valve is grossly normal. Trivial mitral valve regurgitation. No evidence of mitral valve stenosis. Tricuspid Valve: The tricuspid valve is grossly normal. Tricuspid valve regurgitation is not demonstrated. No evidence of tricuspid stenosis. Aortic Valve: The aortic valve is tricuspid. Aortic valve regurgitation is not visualized. No aortic stenosis is present. Pulmonic Valve: The pulmonic valve was grossly normal. Pulmonic valve regurgitation  is not visualized. No evidence of pulmonic stenosis. Aorta: The aortic root is normal in size and structure. Venous: The inferior vena cava is normal in size with greater than 50% respiratory variability, suggesting right atrial pressure of 3 mmHg. IAS/Shunts: The atrial septum is grossly normal.  LEFT VENTRICLE PLAX 2D LVIDd:         5.10 cm Diastology LVIDs:         3.20 cm LV e' lateral:   16.40 cm/s LV PW:         0.80 cm LV E/e' lateral: 7.6 LV IVS:        0.90 cm LV e' medial:    12.60 cm/s                        LV E/e' medial:  9.8  LEFT ATRIUM           Index       RIGHT ATRIUM           Index LA diam:      3.60 cm 1.85 cm/m  RA Area:     13.90 cm  LA Vol (A4C): 49.8 ml 25.56 ml/m RA Volume:   30.40 ml  15.61 ml/m   AORTA Ao Root diam: 2.60 cm MITRAL VALVE MV Area (PHT): 5.88 cm MV Decel Time: 129 msec MV E velocity: 124.00 cm/s MV A velocity: 93.00 cm/s MV E/A ratio:  1.33 Lennie OdorWesley O'Neal MD Electronically signed by Lennie OdorWesley O'Neal MD Signature Date/Time: 07/30/2019/11:31:31 AM    Final       Subjective: Fever, confusion, vomiting, diarrhea, malaise.  Leg is improving, still some pain with walking.  Discharge Exam: Vitals:   08/02/19 2118 08/03/19 0554  BP: 123/82 124/82  Pulse: 88 90  Resp: 12 16  Temp: 98.9 F (37.2 C) 98.4 F (36.9 C)  SpO2: 99% 100%   Vitals:   08/02/19 0638 08/02/19 1323 08/02/19 2118 08/03/19 0554  BP: 124/88 132/84 123/82 124/82  Pulse: 80 86 88 90  Resp: 15  12 16   Temp: 98.7 F (37.1 C) 98.2 F (36.8 C) 98.9 F (37.2 C) 98.4 F (36.9 C)  TempSrc: Oral  Oral Oral  SpO2: 100% 98% 99% 100%  Weight:      Height:        General: Pt is alert, awake, not in acute distress Cardiovascular: RRR, nl S1-S2, no murmurs appreciated.   No LE edema.   Respiratory: Normal respiratory rate and rhythm.  CTAB without rales or wheezes. Abdominal: Abdomen soft and non-tender.  No distension or HSM.   MSK:      Neuro/Psych: Strength symmetric in upper and lower  extremities.  Judgment and insight appear normal.   The results of significant diagnostics from this hospitalization (including imaging, microbiology, ancillary and laboratory) are listed below for reference.     Microbiology: Recent Results (from the past 240 hour(s))  Blood culture (routine x 2)     Status: None   Collection Time: 07/29/19  3:51 PM   Specimen: BLOOD LEFT FOREARM  Result Value Ref Range Status   Specimen Description   Final    BLOOD LEFT FOREARM Performed at [facility], [address] [place], [place] [address]    Special Requests   Final    BOTTLES DRAWN AEROBIC AND ANAEROBIC Blood Culture results may not be optimal due to an inadequate volume of blood received in culture bottles Performed at [facility], [address] [place], [place] [address]    Culture   Final    NO GROWTH 5 DAYS Performed at [facility], [address] [place], [place] [address]    Report Status 08/03/2019 FINAL  Final  Blood culture (routine x 2)     Status: None   Collection Time: 07/29/19  3:56 PM   Specimen: BLOOD  Result Value Ref Range Status   Specimen Description   Final    BLOOD LEFT ANTECUBITAL Performed at [facility], [address] [place], [place] [address]    Special Requests   Final    BOTTLES DRAWN AEROBIC AND ANAEROBIC Blood Culture adequate volume Performed at [facility], [address] [place], [place] [address]    Culture   Final    NO GROWTH 5 DAYS Performed at [facility], [address] [place], [place] [address]    Report Status 08/03/2019 FINAL  Final     Labs: BNP (last 3 results) No results for input(s): BNP in the last 8760 hours. Basic Metabolic Panel: Recent Labs  Lab 07/29/19 1543 07/29/19 2315 07/30/19 0454  07/31/19 0406 08/01/19 0408 08/03/19 0158  NA 140  --  141  --   --   --   K 3.0*  --  4.0  --   --   --   CL 105  --  108  --    --   --   CO2 27  --  25  --   --   --   GLUCOSE 93  --  112*  --   --   --   BUN 10  --  9  --   --   --   CREATININE 0.79  --  1.00  --  0.99 0.99  CALCIUM 8.5*  --  8.4*  --   --   --   MG  --  1.6* 1.6* 1.7  --   --   PHOS  --  4.5 4.0  --   --   --    Liver Function Tests: Recent Labs  Lab 07/29/19 1543 07/29/19 1653 07/30/19 0454  AST 22 19 19   ALT 18 17 16   ALKPHOS 54 46 46  BILITOT 0.4 0.4 0.3  PROT 7.9 7.0 6.9  ALBUMIN 2.7* 2.2* 2.2*   No results for input(s): LIPASE, AMYLASE in the last 168 hours. No results for input(s): AMMONIA in the last 168 hours. CBC: Recent Labs  Lab 07/29/19 1543 07/30/19 0454 07/31/19 0406  WBC 15.9* 11.0* 8.4  NEUTROABS 11.1* 7.4  --   HGB 12.0* 10.8* 11.3*  HCT 38.0* 34.0* 35.9*  MCV 90.3 87.4 89.8  PLT 464* 482* 453*   Cardiac Enzymes: No results for input(s): CKTOTAL, CKMB, CKMBINDEX, TROPONINI in the last 168 hours. BNP: Invalid input(s): POCBNP CBG: No results for input(s): GLUCAP in the last 168 hours. D-Dimer No results for input(s): DDIMER in the last 72 hours. Hgb A1c No results for input(s): HGBA1C in the last 72 hours. Lipid Profile No results for input(s): CHOL, HDL, LDLCALC, TRIG, CHOLHDL, LDLDIRECT in the last 72 hours. Thyroid function studies No results for input(s): TSH, T4TOTAL, T3FREE, THYROIDAB in the last 72 hours.  Invalid input(s): FREET3 Anemia work up No results for input(s): VITAMINB12, FOLATE, FERRITIN, TIBC, IRON, RETICCTPCT in the last 72 hours. Urinalysis    Component Value Date/Time   COLORURINE YELLOW 12/30/2016 0215   APPEARANCEUR CLEAR 12/30/2016 0215   LABSPEC 1.021 12/30/2016 0215   PHURINE 5.0 12/30/2016 0215   GLUCOSEU NEGATIVE 12/30/2016 0215   HGBUR NEGATIVE 12/30/2016 0215   BILIRUBINUR NEGATIVE 12/30/2016 0215   KETONESUR NEGATIVE 12/30/2016 0215   PROTEINUR NEGATIVE 12/30/2016 0215   UROBILINOGEN 0.2 03/26/2014 2255   NITRITE NEGATIVE 12/30/2016 0215   LEUKOCYTESUR  NEGATIVE 12/30/2016 0215   Sepsis Labs Invalid input(s): PROCALCITONIN,  WBC,  LACTICIDVEN Microbiology Recent Results (from the past 240 hour(s))  Blood culture (routine x 2)     Status: None   Collection Time: 07/29/19  3:51 PM   Specimen: BLOOD LEFT FOREARM  Result Value Ref Range Status   Specimen Description   Final    BLOOD LEFT FOREARM Performed at [facility], [address] [place], [place] [address]    Special Requests   Final    BOTTLES DRAWN AEROBIC AND ANAEROBIC Blood Culture results may not be optimal due to an inadequate volume of blood received in culture bottles Performed at [facility], [address] [place], [place] [address]    Culture   Final    NO GROWTH 5 DAYS  Performed at [facility], [address], [place], [place] [address]    Report Status 08/03/2019 FINAL  Final  Blood culture (routine x 2)     Status: None   Collection Time: 07/29/19  3:56 PM   Specimen: BLOOD  Result Value Ref Range Status   Specimen Description   Final    BLOOD LEFT ANTECUBITAL Performed at [facility], [address], [place], [place] [address]    Special Requests   Final    BOTTLES DRAWN AEROBIC AND ANAEROBIC Blood Culture adequate volume Performed at [facility], [address], [place], [place] [address]    Culture   Final    NO GROWTH 5 DAYS Performed at [facility], [address], [place], [place] [address]    Report Status 08/03/2019 FINAL  Final     Time coordinating discharge: 35 minutes The [place] controlled substances registry was reviewed for this patient prior to filling the <5 days supply controlled substances script.      SIGNED:   Alberteen Sam, MD  [facility] 08/03/2019, 2:55 PM

## 2019-08-03 NOTE — TOC Transition Note (Signed)
Transition of Care [facility]) - CM/SW Discharge Note   Patient Details  Name: Toris Laverdiere MRN: [id number] Date of Birth: 1986-02-12  [facility]) CM/SW Contact:  Elliot Gault, LCSW Phone Number: 08/03/2019, 11:19 AM   Clinical Narrative:     Pt stable for dc per MD. Pt aware of [facility] treatment follow up resources and states he has called but no beds are available and there are restrictions related to Covid diagnosis.   Scheduled pt for new patient appointment at [facility]. First visit will be telephonic. Completed MATCH for pt with override from [facility] supervisor. If pt cannot find a ride, will provide bus pass.  No other TOC needs identified for dc.  Final next level of care: Home/Self Care Barriers to Discharge: Barriers Resolved   Patient Goals and CMS Choice        Discharge Placement                       Discharge Plan and Services   Discharge Planning Services: [facility] Program, Follow-up appt scheduled                                 Social Determinants of Health (SDOH) Interventions     Readmission Risk Interventions Readmission Risk Prevention Plan 04/18/2019  Transportation Screening Complete  PCP or Specialist Appt within 5-7 Days Complete  Home Care Screening Complete  Medication Review (RN CM) Referral to Pharmacy  Some recent data might be hidden

## 2019-08-03 NOTE — Progress Notes (Signed)
Orthopedic Tech Progress Note Patient Details:  Grant Tapia August 09, 1985 [id number]  Ortho Devices Type of Ortho Device: CAM walker, Crutches Ortho Device/Splint Location: right Ortho Device/Splint Interventions: Application   Post Interventions Patient Tolerated: Well Instructions Provided: Care of device   Saul Fordyce 08/03/2019, 11:07 AM

## 2019-08-04 ENCOUNTER — Telehealth: Payer: Self-pay | Admitting: Family Medicine

## 2019-08-04 LAB — VARICELLA-ZOSTER BY PCR: Varicella-Zoster, PCR: NEGATIVE

## 2019-08-04 LAB — HSV DNA BY PCR (REFERENCE LAB)
HSV 1 DNA: POSITIVE — AB
HSV 2 DNA: NEGATIVE

## 2019-08-04 MED ORDER — ACYCLOVIR 400 MG PO TABS: 400.0000 mg | ORAL_TABLET | Freq: Three times a day (TID) | ORAL | 0 refills | Status: AC

## 2019-08-04 NOTE — Telephone Encounter (Signed)
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  Called acyclovir 400 TID in to patient's pharmacy for 4 days to treat what is likely an HSV flare.  Called to patient, no answer.  Will call again tomorrow.

## 2019-08-05 LAB — HCV RNA QUANT RFLX ULTRA OR GENOTYP
HCV RNA Qnt(log copy/mL): 6.966 log10 IU/mL
HepC Qn: 9250000 IU/mL

## 2019-08-05 LAB — HEPATITIS C GENOTYPE: Hepatitis C Genotype: 3

## 2019-08-07 NOTE — Telephone Encounter (Signed)
Called again, spoke with a family member.    This family will pick up his prescription and get it to him.  TRH office number given if patient has questions.  Unfortunately, the number given by the patient and double checked with him prior to discharge was this family members number, patient himself has no contact information.  Family will try to relay message.

## 2019-09-07 ENCOUNTER — Ambulatory Visit: Payer: Self-pay | Admitting: Nurse Practitioner

## 2019-09-08 ENCOUNTER — Other Ambulatory Visit: Payer: Self-pay

## 2019-09-08 ENCOUNTER — Emergency Department (HOSPITAL_COMMUNITY)
Admission: EM | Admit: 2019-09-08 | Discharge: 2019-09-09 | Disposition: A | Discharge status: Home / Self Care | Payer: Self-pay | Attending: Emergency Medicine | Admitting: Emergency Medicine

## 2019-09-08 ENCOUNTER — Encounter (HOSPITAL_COMMUNITY): Payer: Self-pay

## 2019-09-08 ENCOUNTER — Emergency Department (HOSPITAL_COMMUNITY): Payer: Self-pay

## 2019-09-08 DIAGNOSIS — W458XXA Other foreign body or object entering through skin, initial encounter: Secondary | ICD-10-CM | POA: Insufficient documentation

## 2019-09-08 DIAGNOSIS — L089 Local infection of the skin and subcutaneous tissue, unspecified: Secondary | ICD-10-CM

## 2019-09-08 DIAGNOSIS — Y939 Activity, unspecified: Secondary | ICD-10-CM | POA: Insufficient documentation

## 2019-09-08 DIAGNOSIS — Y929 Unspecified place or not applicable: Secondary | ICD-10-CM | POA: Insufficient documentation

## 2019-09-08 DIAGNOSIS — Z79899 Other long term (current) drug therapy: Secondary | ICD-10-CM | POA: Insufficient documentation

## 2019-09-08 DIAGNOSIS — F19920 Other psychoactive substance use, unspecified with intoxication, uncomplicated: Secondary | ICD-10-CM

## 2019-09-08 DIAGNOSIS — S91331A Puncture wound without foreign body, right foot, initial encounter: Secondary | ICD-10-CM

## 2019-09-08 DIAGNOSIS — Y999 Unspecified external cause status: Secondary | ICD-10-CM | POA: Insufficient documentation

## 2019-09-08 DIAGNOSIS — I10 Essential (primary) hypertension: Secondary | ICD-10-CM | POA: Insufficient documentation

## 2019-09-08 DIAGNOSIS — Z87891 Personal history of nicotine dependence: Secondary | ICD-10-CM | POA: Insufficient documentation

## 2019-09-08 NOTE — ED Triage Notes (Signed)
Per EMS, Pt is coming for polysubstance abuse. No narcan given. Pt states he would like to detox.

## 2019-09-08 NOTE — ED Provider Notes (Signed)
[facility] DEPT Provider Note   CSN: [id number] Arrival date & time: 09/08/19  2221     History Chief Complaint  Patient presents with  . Drug Problem    Grant Tapia is a 34 y.o. male with a history of IV heroin use, IV methamphetamine use, hepatitis C who presents to the emergency department by EMS with a chief complaint of polysubstance detox.  EMS reports that they were called out to a parking lot after bystanders called stating that the patient looked intoxicated.  Heart rate was initially 160s on arrival, but improved to 120 by the time he arrived to the ER without treatment.  They report patient was satting in the mid 90s, but when he fell asleep he did desat to 89%, but this improved when he would wake up.  No fever.  Blood pressure 118/54.  They report that the patient was not recently incarcerated and was supposed to be going to a detox facility at [facility], but they are unsure why he did not go to the facility after he was released from jail.  Patient states that he used heroin approximately 1 hour prior to arrival and 3-4 times today.  He also reports that he used IV meth this morning.  He has been injecting in his right upper arm and has not redness, pain, numbness, or weakness.  No arthralgias.  No fever, chills, cough, shortness of breath, chest pain, abdominal pain, nausea, vomiting, diarrhea, back pain, urinary or fecal incontinence.  He does report that he has a small abscess to the right knee that he has been squeezing a small amount of purulent drainage out of it.  He also notes that his left fourth finger has been red and painful along the distal tip and he has been squeezing the area.  He also notes that he has a developing abscess to his right jaw and his beard.  No drainage from this area.  Yesterday, he reports that he was at a friend's house when he stepped on an object barefoot that punctured through his right foot.  He is  unsure what the object was, but reports that he was able to remove it.  He is endorsing pain to the right foot that is worse with weightbearing.  No redness, swelling, numbness, or weakness.  He also reports that he fell 2 nights ago and hit the bridge of his nose on the corner of a cabinet.  No LOC.  He has had no visual changes, jaw pain, headaches, neck pain, or other facial pain or swelling.  His only complaint is an abrasion to the bridge of the nose.  The history is provided by the patient. No language interpreter was used.       Past Medical History:  Diagnosis Date  . Heroin abuse ([place])   . Hypertension   . IV drug user   . Neck abscess 04/2019  . Overdose   . Polysubstance abuse [facility])     Patient Active Problem List   Diagnosis Date Noted  . Hypomagnesemia 07/30/2019  . Sepsis ([place]) 07/30/2019  . Cellulitis of right leg 07/29/2019  . Right knee skin infection 07/23/2019  . COVID-19 virus infection 07/23/2019  . Infection of right knee ([place]) 07/22/2019  . HTN (hypertension), benign 04/15/2019  . Poisoning by heroin, accidental (unintentional), initial encounter ([place]) 05/08/2017  . Heroin dependence ([place]) 05/08/2017  . Back pain 12/30/2016  . Abnormal LFTs 12/30/2016  . Polysubstance abuse ([place])   .  Cellulitis 11/19/2016  . IV drug user 11/19/2016  . Neck abscess 11/19/2016  . Delirium   . Tachycardia   . Overdose 07/18/2016  . Drug withdrawal (HCC) 07/16/2016  . AKI (acute kidney injury) (HCC) 07/15/2016  . Alcohol abuse 07/15/2016  . Heroin abuse (HCC) 07/15/2016  . 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 Cocaine abuse (HCC) 07/15/2016  . Acute metabolic encephalopathy 07/15/2016  . Lactic acidosis 03/29/2014  . Severe sepsis (HCC) 03/27/2014  . Chest pain   . Headache   . Neck pain     Past Surgical History:  Procedure Laterality Date  . I & D EXTREMITY Left 11/19/2016   Procedure: INCISION AND DRAINAGE LEFT ELBOW  ABSCESS;  Surgeon: Myrene Galas,  MD;  Location: [facility] OR;  Service: Orthopedics;  Laterality: Left;  . INCISION AND DRAINAGE ABSCESS Left 04/17/2019   Procedure: INCISION AND DRAINAGE NECK ABSCESS;  Surgeon: [name], MD;  Location: [facility] OR;  Service: ENT;  Laterality: Left;       Family History  Problem Relation Age of Onset  . Healthy Mother   . Healthy Father     Social History   Tobacco Use  . Smoking status: Former Smoker    Quit date: 12/12/2017    Years since quitting: 1.7  . Smokeless tobacco: Never Used  Substance Use Topics  . Alcohol use: Yes    Comment: occasional wine  . Drug use: Yes    Types: IV, Cocaine    Comment: heroin    Home Medications Prior to Admission medications   Medication Sig Start Date End Date Taking? Authorizing Provider  acetaminophen (TYLENOL) 500 MG tablet Take 1,000 mg by mouth every 6 (six) hours as needed for moderate pain.   Yes [provider]  buprenorphine-naloxone (SUBOXONE) 8-2 mg SUBL SL tablet Place 1 tablet under the tongue 2 (two) times daily. 08/03/19   Danford, Earl Lites, MD  sulfamethoxazole-trimethoprim (BACTRIM DS) 800-160 MG tablet Take 1 tablet by mouth 2 (two) times daily. Patient not taking: Reported on 09/09/2019 08/03/19   Alberteen Sam, MD    Allergies    Bee venom  Review of Systems   Review of Systems  Constitutional: Negative for appetite change, chills, diaphoresis and fever.  HENT: Negative for congestion.   Respiratory: Negative for shortness of breath.   Cardiovascular: Negative for chest pain.  Gastrointestinal: Negative for abdominal pain, diarrhea, nausea and vomiting.  Genitourinary: Negative for dysuria.  Musculoskeletal: Negative for back pain, myalgias, neck pain and neck stiffness.  Skin: Positive for wound. Negative for color change and rash.  Allergic/Immunologic: Negative for immunocompromised state.  Neurological: Negative for seizures, syncope, weakness, numbness and headaches.  Psychiatric/Behavioral:  Negative for confusion.   Physical Exam Updated Vital Signs BP (!) 139/109 (BP Location: Right Arm)   Pulse 84   Temp 99 F (37.2 C)   Resp 10   Ht 5\' 10"  (1.778 m)   Wt 72.6 kg   SpO2 97%   BMI 22.96 kg/m   Physical Exam Vitals and nursing note reviewed.  Constitutional:      General: He is not in acute distress.    Appearance: He is well-developed. He is not ill-appearing, toxic-appearing or diaphoretic.  HENT:     Head: Normocephalic.  Eyes:     Conjunctiva/sclera: Conjunctivae normal.  Cardiovascular:     Rate and Rhythm: Regular rhythm. Tachycardia present.     Pulses: Normal pulses.     Heart sounds: Normal heart sounds. No murmur.  No friction rub. No gallop.      Comments: No murmurs rubs or gallops. Pulmonary:     Effort: Pulmonary effort is normal. No respiratory distress.     Breath sounds: No stridor. No wheezing, rhonchi or rales.  Chest:     Chest wall: No tenderness.  Abdominal:     General: There is no distension.     Palpations: Abdomen is soft. There is no mass.     Tenderness: There is no abdominal tenderness. There is no right CVA tenderness, left CVA tenderness, guarding or rebound.     Hernia: No hernia is present.  Musculoskeletal:     Cervical back: Neck supple.     Comments: No tenderness to the cervical, thoracic, or lumbar spinous processes or bilateral paraspinal muscles.  Skin:    General: Skin is warm and dry.     Comments: Multiple scabs and wounds noted from head to toe.  Distal tip of the left fourth digit is erythematous with a draining wound on the medial aspect of the distal tip.  Pad of the finger is soft.  No paronychia.  No red streaking.  Digit is neurovascular intact.  Developing abscess noted today right jawline.  Small amount of fluctuance.  No active drainage.  Full active and passive range of motion of the neck.  No surrounding cellulitis.  Small puncture wound noted to the sole of the right foot.  No surrounding erythema  or warmth.  No drainage.  Cutaneous, superficial abscess noted to the right anterior patella.  Full active and passive range of motion.  Patient is able to weight-bear without difficulty.  Neurological:     Mental Status: He is alert.     Comments: Patient is alert, but will and frequently closes eyes and fall asleep.  Rouses easily to voice.  Alert and oriented x4.  Speech is not slurred.  Psychiatric:        Behavior: Behavior normal.              ED Results / Procedures / Treatments   Labs (all labs ordered are listed, but only abnormal results are displayed) Labs Reviewed - No data to display  EKG None  Radiology DG Foot 2 Views Right  Result Date: 09/08/2019 CLINICAL DATA:  Puncture wound EXAM: RIGHT FOOT - 2 VIEW COMPARISON:  None. FINDINGS: There is no evidence of fracture or dislocation. There is no evidence of arthropathy or other focal bone abnormality. Soft tissues are unremarkable. IMPRESSION: Negative. Electronically Signed   By: Jasmine Pang M.D.   On: 09/08/2019 23:31    Procedures Procedures (including critical care time)  Medications Ordered in ED Medications  acetaminophen (TYLENOL) tablet 650 mg (650 mg Oral Given 09/09/19 0249)  doxycycline (VIBRA-TABS) tablet 100 mg (100 mg Oral Given 09/09/19 0249)    ED Course  I have reviewed the triage vital signs and the nursing notes.  Pertinent labs & imaging results that were available during my care of the patient were reviewed by me and considered in my medical decision making (see chart for details).    MDM Rules/Calculators/A&P                      34 year old male with a history of IV heroin use, IV methamphetamine use, hepatitis C who presents to the ER by EMS after bystanders called EMS because he appeared to be acutely intoxicated.  He endorses heroin use 1 hour prior to arrival.   EMS  reports that the patient was initially tachycardic to 160s on arrival.  Heart rate 120s on arrival to the ER  and heart rate has continued to improve without treatment.  Rectal temp is 99.  Patient is having no constitutional symptoms, back pain, chest pain, arthralgias.  Patient has been injecting in his right upper extremity and there is no evidence of focal infection.  On exam, he does have several areas of both superficial infection as mentioned on physical exam, but does not have signs or symptoms concerning for systemic infection, including epidural abscess, endocarditis, septic joints, myositis or necrotizing fasciitis.  Discussed the plan with Dr. Jeraldine Loots, attending physician who is in agreement with work-up and plan.  Shared decision-making conversation with the patient.  Discussed that since his heart rate is elevated that we could further work-up labs.  Patient declined at this time.  He reports that he is feeling much better than previous visits to the ER where he has had to be admitted.  I think, given the multiple places where he has cutaneous infection that he would benefit from oral antibiotics.  Patient is in agreement, but states that he is currently suffering from homelessness and does not have funds to be able to afford a prescription.  Will obtain x-ray of the right foot wound given that he is unsure of what he stepped on.  No retained foreign body in right foot wound.  There is no evidence of infection.  He is otherwise not immunocompromise and does not need to be covered for Pseudomonas.  However, I think it is reasonable to discharge him with a course of doxycycline is other wounds are likely secondary to MRSA.  Will place transitions of care consult to assist with medication coordination.  Patient has been continued to be observed for multiple hours.  He is well-appearing and in no acute distress.  Heart rate is now in the 80s after no treatment.  Has no other complaints.  Will await transition of care consult Patient care transferred to PA Green at the end of my shift. Patient presentation,  ED course, and plan of care discussed with review of all pertinent labs and imaging. Please see his/her note for further details regarding further ED course and disposition.  Final Clinical Impression(s) / ED Diagnoses Final diagnoses:  Drug intoxication without complication (HCC)  Puncture wound of sole of right foot without complication, initial encounter  Skin infection  Finger infection    Rx / DC Orders ED Discharge Orders    None       Allean Montfort A, PA-C 09/09/19 6270    Gerhard Munch, MD 09/17/19 1428

## 2019-09-09 MED ORDER — DOXYCYCLINE HYCLATE 100 MG PO TABS
100.0000 mg | ORAL_TABLET | Freq: Once | ORAL | Status: AC
  Administered 2019-09-09: 100 mg via ORAL
  Filled 2019-09-09: qty 1

## 2019-09-09 MED ORDER — ACETAMINOPHEN 325 MG PO TABS
650.0000 mg | ORAL_TABLET | Freq: Once | ORAL | Status: AC
  Administered 2019-09-09: 650 mg via ORAL
  Filled 2019-09-09: qty 2

## 2019-09-09 MED ORDER — SULFAMETHOXAZOLE-TRIMETHOPRIM 800-160 MG PO TABS: 1.0000 | ORAL_TABLET | Freq: Two times a day (BID) | ORAL | 0 refills | Status: AC

## 2019-09-09 NOTE — TOC Initial Note (Signed)
Transition of Care [facility]) - Initial/Assessment Note    Patient Details  Name: Grant Tapia MRN: [id number] Date of Birth: [birth year]  Transition of Care [facility]) CM/SW Contact:    Lockie Pares, RN Phone Number: 09/09/2019, 10:33 AM  Clinical Narrative:                 Talked to the patient over the phone about the fact that he already has received a MATCH for medications in the last year. He really needs antibiotics. Discussed good RX. At [facility] it is 11 dollars with good rx for Bactrim. I also advised him on a PCP for community health and wellness, added to patient instructions.         Patient Goals and CMS Choice        Expected Discharge Plan and Services    Homeless, CSW added resources to discharge                                            Prior Living Arrangements/Services    Homeless                   Activities of Daily Living      Permission Sought/Granted                  Emotional Assessment              Admission diagnosis:  heroin, meth use  Patient Active Problem List   Diagnosis Date Noted  . Hypomagnesemia 07/30/2019  . Sepsis (HCC) 07/30/2019  . Cellulitis of right leg 07/29/2019  . Right knee skin infection 07/23/2019  . COVID-19 virus infection 07/23/2019  . Infection of right knee (HCC) 07/22/2019  . HTN (hypertension), benign 04/15/2019  . Poisoning by heroin, accidental (unintentional), initial encounter (HCC) 05/08/2017  . Heroin dependence (HCC) 05/08/2017  . Back pain 12/30/2016  . Abnormal LFTs 12/30/2016  . Polysubstance abuse (HCC)   . Cellulitis 11/19/2016  . IV drug user 11/19/2016  . Neck abscess 11/19/2016  . Delirium   . Tachycardia   . Overdose 07/18/2016  . Drug withdrawal (HCC) 07/16/2016  . AKI (acute kidney injury) (HCC) 07/15/2016  . Alcohol abuse 07/15/2016  . Heroin abuse (HCC) 07/15/2016  . 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 Cocaine abuse (HCC) 07/15/2016   . Acute metabolic encephalopathy 07/15/2016  . Lactic acidosis 03/29/2014  . Severe sepsis (HCC) 03/27/2014  . Chest pain   . Headache   . Neck pain    PCP:  Patient, No Pcp Per Pharmacy:   [id number] [place], [place] - [address] AT [facility] [address] [place] [place] [address] Phone: [phone] Fax: [phone]  [id number] [place] [facility] - [address] [place] [address] Phone: [phone] Fax: [phone]  [facility] [place], [place] - [address] [place] [address] Phone: [phone] Fax: [phone]  [facility] [address] [place] - [address] [place] [address] Phone: [phone] Fax: [phone]  [facility] [place], [place] - [address] [place] [address] [place] [address] Phone: [phone] Fax: [phone]     Social Determinants of Health (SDOH) Interventions    Readmission Risk  Interventions Readmission Risk Prevention Plan 04/18/2019  Transportation Screening Complete  PCP or Specialist Appt within 5-7 Days Complete  Home Care Screening Complete  Medication Review (RN CM) Referral to Pharmacy  Some recent data might be hidden

## 2019-09-09 NOTE — ED Notes (Signed)
Pt provided with meal tray.

## 2019-09-09 NOTE — ED Notes (Signed)
Attempted to Contact SW- Unable to contact at this time.

## 2019-09-09 NOTE — ED Notes (Signed)
Secure Chat message sent to Grant Tapia SW about pt consult

## 2019-09-09 NOTE — ED Notes (Signed)
Pt requesting tylenol. Trifan MD made aware.

## 2019-09-09 NOTE — ED Notes (Signed)
Pt provided with Good RX prescription Coupon

## 2019-09-09 NOTE — Progress Notes (Addendum)
CSW added homeless resources and drug detox information to the patient's AVS.  Edwin Dada, MSW, LCSW-A Transitions of Care  Clinical Social Worker  [facility] Emergency Departments  Medical ICU

## 2019-09-09 NOTE — ED Provider Notes (Signed)
Made aware of patient at shift change by handoff provider Mia McDonald, PA-C.  Patient presented the ED with drug intoxication in the context of polysubstance abuse.  He did appear to have a puncture wound to the sole of his right foot as well as a finger infection.  Handoff provider felt as though he would benefit from oral antibiotics.  Plan was for discharge with course of doxycycline, however transitional care consult was pending at time of handoff to provide medication assistance.  TOC consult with patient.  He had already received a MATCH for medications in last year.  Patient to be provided with GoodRx coupon and instructions to pick up Bactrim (to cover for MRSA) at [facility] for $11.  Patient agreeable to plan and will follow up with [facility].   Lorelee New, PA-C 09/09/19 1044    Terald Sleeper, MD 09/09/19 [phone]

## 2019-09-09 NOTE — ED Notes (Signed)
Meal tray ordered 

## 2019-09-09 NOTE — Discharge Instructions (Addendum)
Thank you for allowing me to care for you today in the Emergency Department.   Use the attached resources to get established with a detox facility.   [facility] [address]  [place], [place] [address] [phone]  [facility] [address] [place], [place] [address] [phone]  [facility] [address] [place], [place] [address] [phone]  [facility] [address] [place], [place] [address] [phone]

## 2020-09-21 IMAGING — CR DG FOOT 2V*R*
2 series · 2 of 2 positions shown · non-contrast
Comparison: None.

CLINICAL DATA: Puncture wound

EXAM:
RIGHT FOOT - 2 VIEW

[x foot ap right]
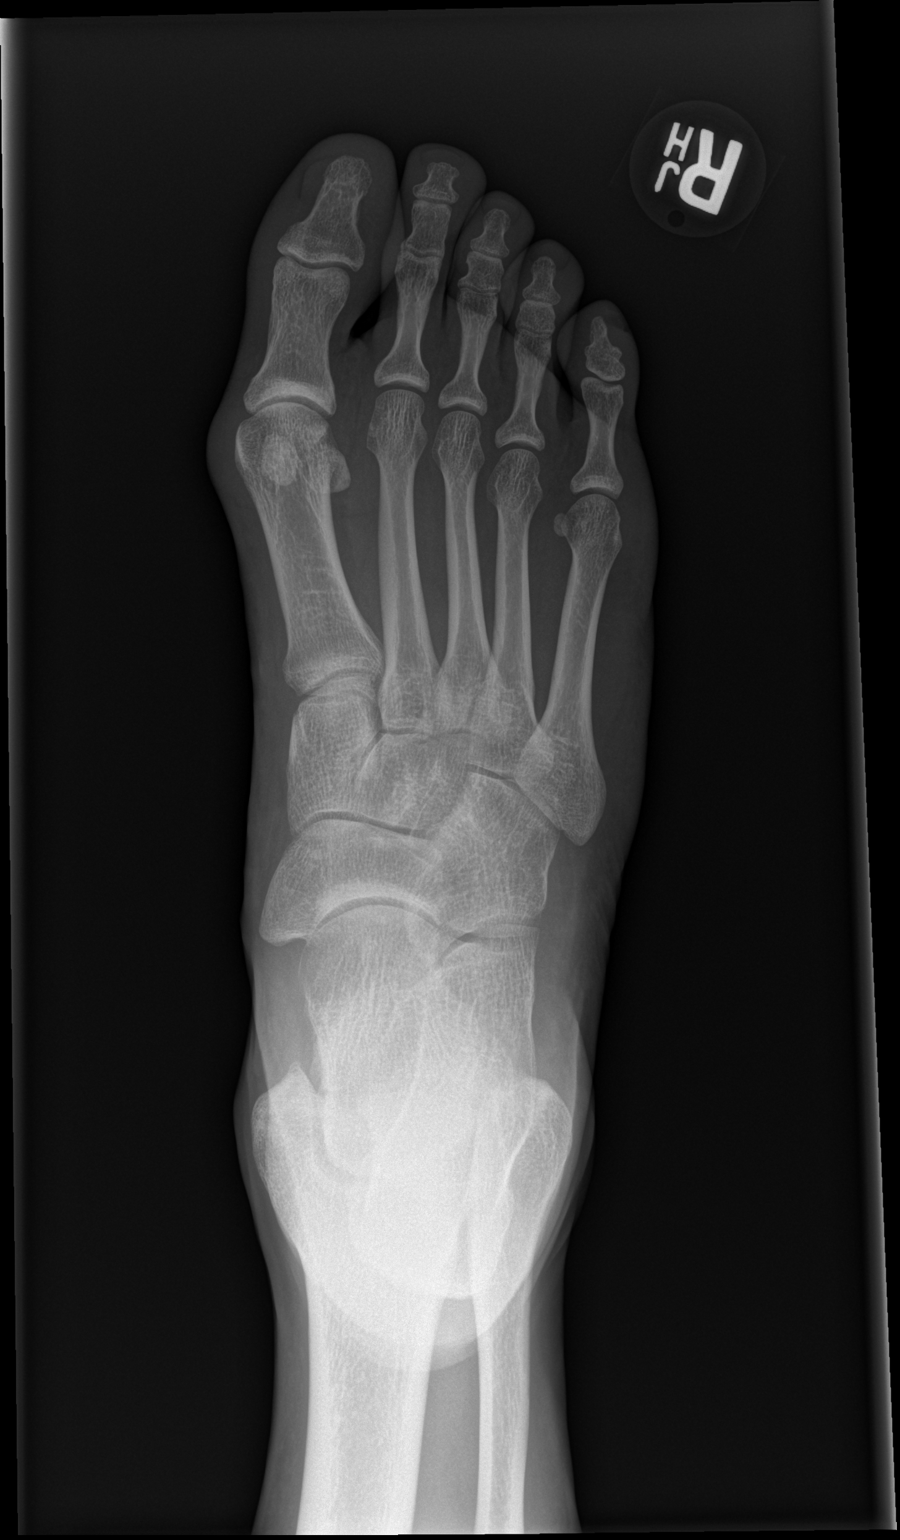

[x foot lat right]
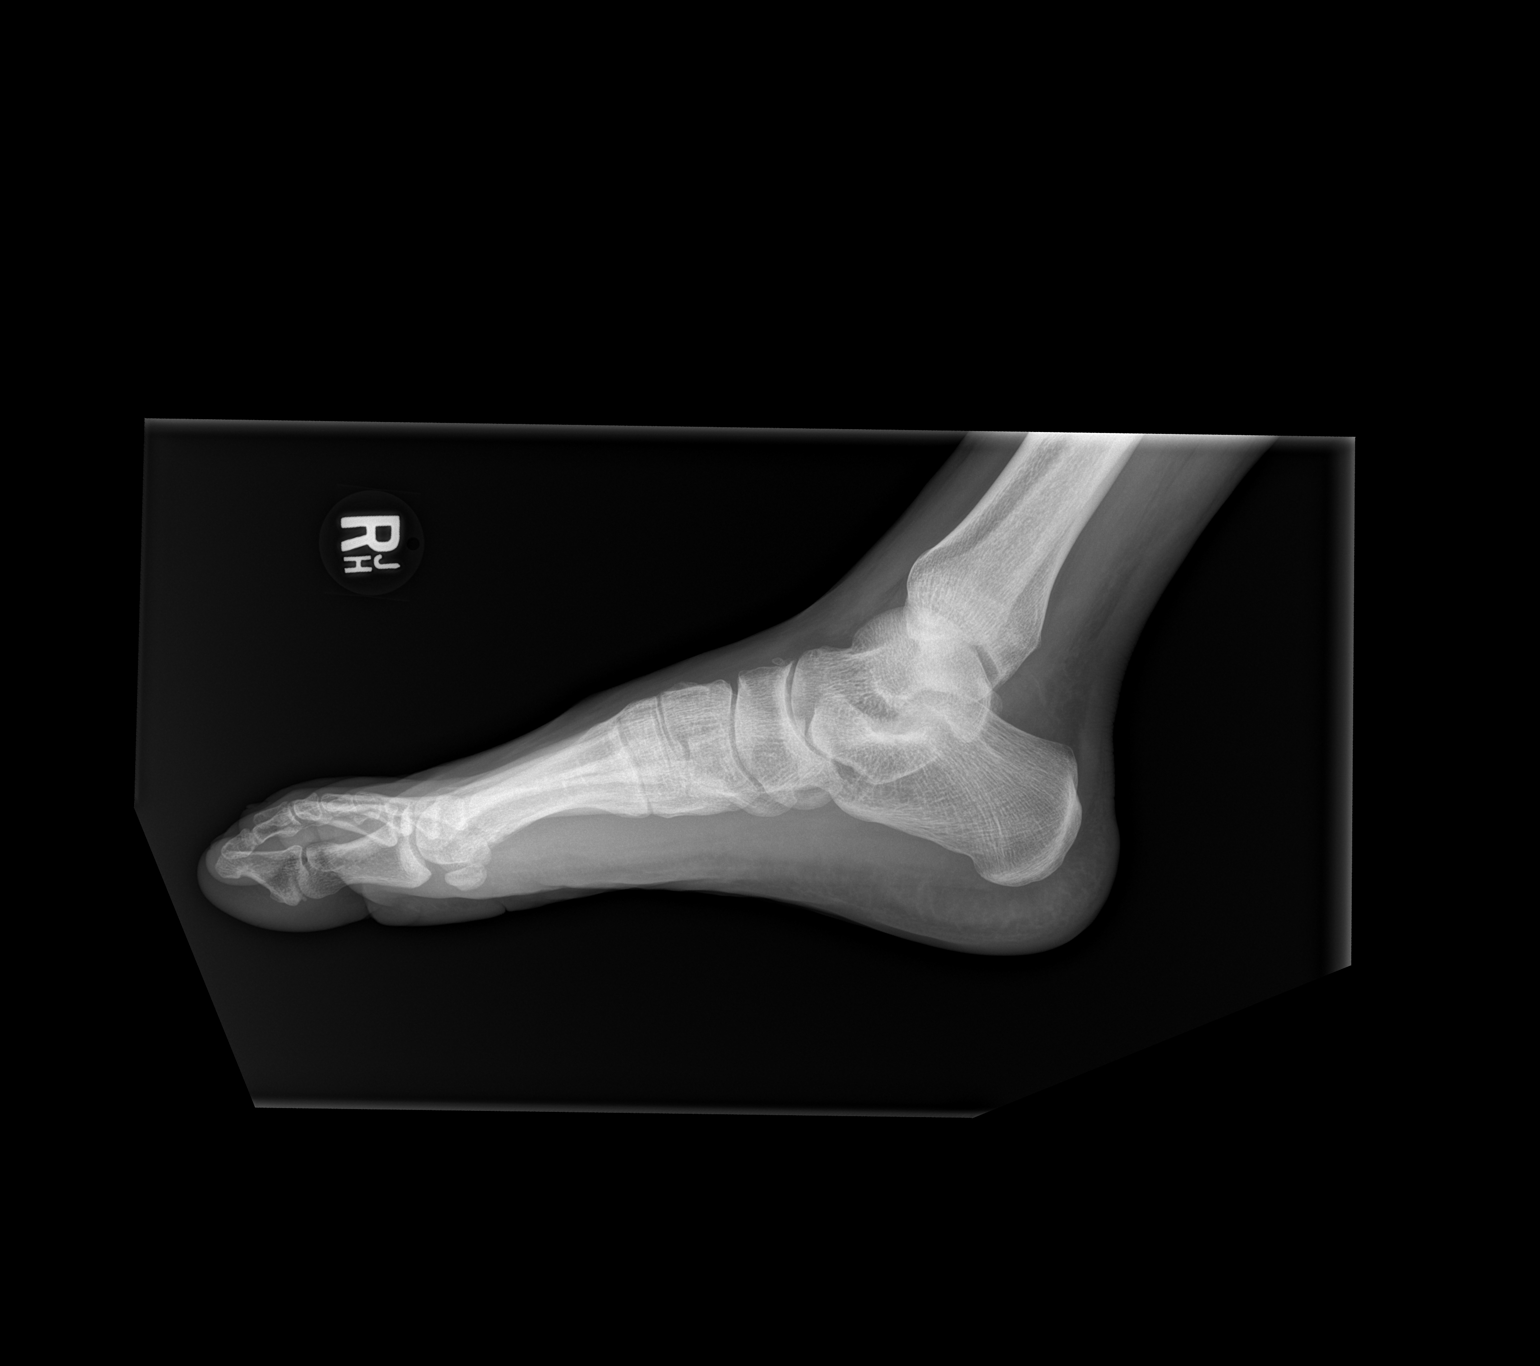

[2 of 2 positions shown; findings below may reference images not displayed]

FINDINGS: There is no evidence of fracture or dislocation. There is no
evidence of arthropathy or other focal bone abnormality. Soft
tissues are unremarkable.
IMPRESSION: Negative.
